# Patient Record
Sex: Female | Born: 1970 | Race: White | Hispanic: No | Smoking: Never smoker
Health system: Southern US, Community
[De-identification: ages and names within clinical notes are randomized; demographics above are authoritative.]

## PROBLEM LIST (undated history)

## (undated) DIAGNOSIS — A0472 Enterocolitis due to Clostridium difficile, not specified as recurrent: Secondary | ICD-10-CM

## (undated) DIAGNOSIS — M542 Cervicalgia: Secondary | ICD-10-CM

## (undated) DIAGNOSIS — I10 Essential (primary) hypertension: Secondary | ICD-10-CM

## (undated) DIAGNOSIS — R0789 Other chest pain: Secondary | ICD-10-CM

## (undated) DIAGNOSIS — E119 Type 2 diabetes mellitus without complications: Secondary | ICD-10-CM

## (undated) DIAGNOSIS — K219 Gastro-esophageal reflux disease without esophagitis: Secondary | ICD-10-CM

## (undated) DIAGNOSIS — M25569 Pain in unspecified knee: Secondary | ICD-10-CM

## (undated) DIAGNOSIS — R7303 Prediabetes: Secondary | ICD-10-CM

## (undated) DIAGNOSIS — R109 Unspecified abdominal pain: Secondary | ICD-10-CM

## (undated) DIAGNOSIS — G8929 Other chronic pain: Secondary | ICD-10-CM

## (undated) DIAGNOSIS — M549 Dorsalgia, unspecified: Secondary | ICD-10-CM

## (undated) DIAGNOSIS — J189 Pneumonia, unspecified organism: Secondary | ICD-10-CM

## (undated) DIAGNOSIS — M5430 Sciatica, unspecified side: Secondary | ICD-10-CM

## (undated) DIAGNOSIS — M797 Fibromyalgia: Secondary | ICD-10-CM

## (undated) DIAGNOSIS — M069 Rheumatoid arthritis, unspecified: Secondary | ICD-10-CM

## (undated) DIAGNOSIS — E039 Hypothyroidism, unspecified: Secondary | ICD-10-CM

## (undated) DIAGNOSIS — M5412 Radiculopathy, cervical region: Secondary | ICD-10-CM

## (undated) DIAGNOSIS — R0781 Pleurodynia: Secondary | ICD-10-CM

## (undated) DIAGNOSIS — F32A Depression, unspecified: Secondary | ICD-10-CM

## (undated) HISTORY — PX: BLADDER SURGERY: SHX569

## (undated) HISTORY — PX: BACK SURGERY: SHX140

## (undated) HISTORY — PX: TUBAL LIGATION: SHX77

## (undated) HISTORY — DX: Hypothyroidism, unspecified: E03.9

## (undated) HISTORY — PX: URETHRAL DIVERTICULECTOMY: SHX2618

---

## 2004-01-10 ENCOUNTER — Other Ambulatory Visit: Payer: Self-pay

## 2004-07-19 ENCOUNTER — Emergency Department: Payer: Self-pay | Admitting: Emergency Medicine

## 2004-10-26 ENCOUNTER — Emergency Department: Payer: Self-pay | Admitting: Emergency Medicine

## 2005-04-27 ENCOUNTER — Emergency Department: Payer: Self-pay | Admitting: Emergency Medicine

## 2005-04-28 ENCOUNTER — Other Ambulatory Visit: Payer: Self-pay

## 2005-05-29 ENCOUNTER — Emergency Department: Payer: Self-pay | Admitting: Emergency Medicine

## 2005-05-30 ENCOUNTER — Emergency Department: Payer: Self-pay | Admitting: Emergency Medicine

## 2005-06-01 ENCOUNTER — Ambulatory Visit: Payer: Self-pay | Admitting: Emergency Medicine

## 2005-07-21 ENCOUNTER — Emergency Department: Payer: Self-pay | Admitting: Emergency Medicine

## 2005-08-03 ENCOUNTER — Ambulatory Visit: Payer: Self-pay | Admitting: Urology

## 2005-09-28 ENCOUNTER — Ambulatory Visit: Payer: Self-pay | Admitting: Urology

## 2005-09-29 ENCOUNTER — Emergency Department: Payer: Self-pay | Admitting: Urology

## 2006-02-26 ENCOUNTER — Emergency Department: Payer: Self-pay | Admitting: Emergency Medicine

## 2006-03-12 ENCOUNTER — Emergency Department: Payer: Self-pay | Admitting: Emergency Medicine

## 2006-05-04 ENCOUNTER — Other Ambulatory Visit: Payer: Self-pay

## 2006-05-04 ENCOUNTER — Emergency Department: Payer: Self-pay | Admitting: Emergency Medicine

## 2006-05-21 ENCOUNTER — Emergency Department: Payer: Self-pay | Admitting: Emergency Medicine

## 2006-05-22 ENCOUNTER — Emergency Department (HOSPITAL_COMMUNITY): Admission: EM | Admit: 2006-05-22 | Discharge: 2006-05-22 | Payer: Self-pay | Admitting: Emergency Medicine

## 2008-06-16 ENCOUNTER — Emergency Department (HOSPITAL_COMMUNITY): Admission: EM | Admit: 2008-06-16 | Discharge: 2008-06-16 | Payer: Self-pay | Admitting: Emergency Medicine

## 2008-06-21 ENCOUNTER — Encounter
Admission: RE | Admit: 2008-06-21 | Discharge: 2008-09-19 | Payer: Self-pay | Admitting: Physical Medicine & Rehabilitation

## 2008-06-25 ENCOUNTER — Ambulatory Visit: Payer: Self-pay | Admitting: Physical Medicine & Rehabilitation

## 2009-05-21 ENCOUNTER — Emergency Department (HOSPITAL_COMMUNITY): Admission: EM | Admit: 2009-05-21 | Discharge: 2009-05-21 | Payer: Self-pay | Admitting: Emergency Medicine

## 2009-09-11 ENCOUNTER — Encounter: Admission: RE | Admit: 2009-09-11 | Discharge: 2009-09-11 | Payer: Self-pay | Admitting: Family Medicine

## 2009-09-23 ENCOUNTER — Emergency Department (HOSPITAL_COMMUNITY): Admission: EM | Admit: 2009-09-23 | Discharge: 2009-09-23 | Payer: Self-pay | Admitting: Emergency Medicine

## 2010-11-17 ENCOUNTER — Ambulatory Visit (HOSPITAL_BASED_OUTPATIENT_CLINIC_OR_DEPARTMENT_OTHER): Payer: Medicare Other | Attending: Family Medicine

## 2010-12-23 ENCOUNTER — Ambulatory Visit (HOSPITAL_BASED_OUTPATIENT_CLINIC_OR_DEPARTMENT_OTHER): Payer: Medicare Other

## 2011-01-20 NOTE — Assessment & Plan Note (Signed)
A 40 year old female with long history of joint pain.  She has pain in  shoulders.  She has neck pain as well as back pain.  She has had  multiple injections by her primary care physician, who is Dr. Lacie Scotts.  She has developed a Cushing syndrome.   She was diagnosed with rheumatoid factor positive, but sent to a  rheumatologist, and she was told that while she has rheumatoid factor  positive, she does not have all the clinical signs of rheumatoid  arthritis.  She had a CT of the neck with contrast showing right C5-C6  osteophyte, smaller right C4-C5 osteophyte.  Her lumbar MRI was normal.  Her EMG/NCV of bilateral upper extremities was normal.   I did review her [place] DMD DSAS  query.  She has over time been on  oxycodone, hydrocodone, and alprazolam as well as a fentanyl patch in  the past.  Prescribers include Dr. Lacie Scotts and his PA as well as Dr.  Harrington Challenger at [facility]; Dr. Alison Murray, who is a dentist in [place],  [place]; Dr. Christell Constant in [place], [place]; Dr. Lynnea Ferrier  at [facility].  This report spans October 25, 2006  until May 28, 2008.  The last prescription was from Dr. Tanya Nones at  [facility].   Her pain level is recorded as a 9/10.  She can walk 5-10 minutes at a  time.  She does not climb steps.  She does not drive.  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  Her Oswestry disability index is 64%.   REVIEW OF SYSTEMS:  Positive for depression, anxiety, and spasms.   PAST SURGICAL HISTORY:  Tubal ligation and bladder surgeries in 2006.   SOCIAL HISTORY:  Married.  She has a 39 year old, who also has a child.  She has a 92 year old and a 54-year-old child of her own.   FAMILY HISTORY:  Heart disease, lung disease,  diabetes, and high blood  pressure.   PSYCHIATRIC PROBLEMS:  Alcohol abuse, drug abuse, and disability.   PHYSICAL EXAMINATION:  VITAL SIGNS:  Her blood pressure is 140/75, pulse  105, respirations 18, and O2 sats 96% on room air.  GENERAL:  Well-developed, overweight female in no acute distress.  She  does have a buffalo hump.  Face appears mildly cushingoid.  Orientation  x3.  Affect is alert.  Gait is normal.  No evidence of toe drag or knee  instability.  EXTREMITIES:  Without edema.  She has normal coordination in the upper  and lower extremities.  Deep tendon reflexes are normal.  Sensation is  normal in the upper and lower extremities.  Full range of motion is  normal in the bilateral upper and lower extremities.  Her neck range of  motion is mildly diminished on the left side with lateral bending and  rotation and moderately diminished on the right side.  She has negative  Spurling maneuver.  She has normal deep tendon reflexes.  Her lumbar  spine range of motion is only mildly diminished.  Straight leg raising  test is negative.  She has mild tenderness to palpation in the lumbar  paraspinal muscles.  Her knees showed no evidence of effusion.  Hands,  wrists, elbows, as well as  feet showed no evidence of joint  abnormalities, deformities, or effusion.  She has normal peripheral  pulses.   IMPRESSION:  1. Chronic pain, back.  MRI negative.  Differential includes      sacroiliac disorder, lumbar facet syndrome with myofascial pain.      We do not think diskogenic pain is a factor here.  2. Neck pain with some cervical spondylosis, but no evidence of      radiculopathy.  3. 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  She does appear to have      wide pelvic angle, which would predispose her.  Given her weight,      would check a knee x-ray just to make sure her joint space is      maintained.  I will see her back in a month and follow up on films.  4. We will send her to  physical therapy and do some terminal knee      extensions overall strengthening and conditioning.  I have overall      emphasized trying to get back to more function, again she would be      able to get back at least a sedentary-type position.   Given multiple providers in the past, would shy away from narcotic  analgesics plus she really does not have any clear indication for these  anyway, I have given her a prescription for Mobic 7.5 daily.  She can  take this along with some Tylenol.  She can continue her Effexor for  anxiety rather than using alprazolam, which is not used for chronic  anxiety.      Erick Colace, M.D.  Electronically Signed     AEK/MedQ  D:  06/25/2008 16:40:20  T:  06/26/2008 03:38:08  Job #:  [id number]

## 2011-03-06 ENCOUNTER — Other Ambulatory Visit: Payer: Self-pay | Admitting: Family Medicine

## 2011-03-06 DIAGNOSIS — M5431 Sciatica, right side: Secondary | ICD-10-CM

## 2011-03-07 ENCOUNTER — Other Ambulatory Visit: Payer: Medicare Other

## 2011-03-12 ENCOUNTER — Other Ambulatory Visit: Payer: Medicare Other

## 2011-03-16 ENCOUNTER — Other Ambulatory Visit: Payer: Medicare Other

## 2011-03-25 ENCOUNTER — Emergency Department (HOSPITAL_COMMUNITY): Payer: Medicare Other

## 2011-03-25 ENCOUNTER — Emergency Department (HOSPITAL_COMMUNITY)
Admission: EM | Admit: 2011-03-25 | Discharge: 2011-03-25 | Disposition: A | Discharge status: Home / Self Care | Payer: Medicare Other | Attending: Emergency Medicine | Admitting: Emergency Medicine

## 2011-03-25 DIAGNOSIS — IMO0001 Reserved for inherently not codable concepts without codable children: Secondary | ICD-10-CM | POA: Insufficient documentation

## 2011-03-25 DIAGNOSIS — R109 Unspecified abdominal pain: Secondary | ICD-10-CM | POA: Insufficient documentation

## 2011-03-25 DIAGNOSIS — I10 Essential (primary) hypertension: Secondary | ICD-10-CM | POA: Insufficient documentation

## 2011-03-25 DIAGNOSIS — K921 Melena: Secondary | ICD-10-CM | POA: Insufficient documentation

## 2011-03-25 DIAGNOSIS — N390 Urinary tract infection, site not specified: Secondary | ICD-10-CM | POA: Insufficient documentation

## 2011-03-25 LAB — POCT I-STAT, CHEM 8
Calcium, Ion: 1.22 mmol/L (ref 1.12–1.32)
Potassium: 4 mEq/L (ref 3.5–5.1)
Sodium: 136 mEq/L (ref 135–145)
TCO2: 24 mmol/L (ref 0–100)

## 2011-03-25 LAB — CBC
HCT: 41.5 % (ref 36.0–46.0)
MCH: 30.3 pg (ref 26.0–34.0)
MCHC: 35.2 g/dL (ref 30.0–36.0)
RBC: 4.82 MIL/uL (ref 3.87–5.11)
RDW: 12.7 % (ref 11.5–15.5)
WBC: 13.2 10*3/uL — ABNORMAL HIGH (ref 4.0–10.5)

## 2011-03-25 LAB — URINALYSIS, ROUTINE W REFLEX MICROSCOPIC: Specific Gravity, Urine: 1.02 (ref 1.005–1.030)

## 2011-03-25 LAB — URINE MICROSCOPIC-ADD ON

## 2011-03-25 LAB — PROTIME-INR: INR: 0.97 (ref 0.00–1.49)

## 2011-03-25 LAB — SAMPLE TO BLOOD BANK

## 2013-02-10 ENCOUNTER — Ambulatory Visit (HOSPITAL_COMMUNITY)
Admission: RE | Admit: 2013-02-10 | Discharge: 2013-02-10 | Disposition: A | Discharge status: Home / Self Care | Payer: Medicare Other | Source: Ambulatory Visit | Attending: Family Medicine | Admitting: Family Medicine

## 2013-02-10 ENCOUNTER — Other Ambulatory Visit: Payer: Self-pay | Admitting: Family Medicine

## 2013-02-10 ENCOUNTER — Other Ambulatory Visit (HOSPITAL_COMMUNITY): Payer: Self-pay | Admitting: Family Medicine

## 2013-02-10 DIAGNOSIS — M545 Low back pain, unspecified: Secondary | ICD-10-CM

## 2013-02-10 DIAGNOSIS — M7989 Other specified soft tissue disorders: Secondary | ICD-10-CM

## 2013-02-10 DIAGNOSIS — M79609 Pain in unspecified limb: Secondary | ICD-10-CM | POA: Insufficient documentation

## 2013-02-10 NOTE — Progress Notes (Signed)
VASCULAR LAB PRELIMINARY  PRELIMINARY  PRELIMINARY  PRELIMINARY  Left lower extremity venous duplex completed.    Preliminary report:  Left:  No evidence of DVT or superficial thrombosis. There is a  Baker's cyst. In the popliteal fossa measuring 1.27 cm  X 2.4 cm  Regina Patton, RVS 02/10/2013, 10:34 PM

## 2013-02-15 ENCOUNTER — Ambulatory Visit
Admission: RE | Admit: 2013-02-15 | Discharge: 2013-02-15 | Disposition: A | Discharge status: Home / Self Care | Payer: Medicare Other | Source: Ambulatory Visit | Attending: Family Medicine | Admitting: Family Medicine

## 2013-02-15 DIAGNOSIS — M545 Low back pain, unspecified: Secondary | ICD-10-CM

## 2013-02-25 ENCOUNTER — Other Ambulatory Visit: Payer: Medicare Other

## 2013-09-12 ENCOUNTER — Emergency Department (HOSPITAL_COMMUNITY)
Admission: EM | Admit: 2013-09-12 | Discharge: 2013-09-12 | Disposition: A | Discharge status: Home / Self Care | Payer: Medicare Other | Attending: Emergency Medicine | Admitting: Emergency Medicine

## 2013-09-12 ENCOUNTER — Encounter (HOSPITAL_COMMUNITY): Payer: Self-pay | Admitting: Emergency Medicine

## 2013-09-12 DIAGNOSIS — N61 Mastitis without abscess: Secondary | ICD-10-CM | POA: Insufficient documentation

## 2013-09-12 DIAGNOSIS — Z79899 Other long term (current) drug therapy: Secondary | ICD-10-CM | POA: Insufficient documentation

## 2013-09-12 DIAGNOSIS — Z792 Long term (current) use of antibiotics: Secondary | ICD-10-CM | POA: Insufficient documentation

## 2013-09-12 DIAGNOSIS — Z791 Long term (current) use of non-steroidal anti-inflammatories (NSAID): Secondary | ICD-10-CM | POA: Insufficient documentation

## 2013-09-12 DIAGNOSIS — Z880 Allergy status to penicillin: Secondary | ICD-10-CM | POA: Insufficient documentation

## 2013-09-12 DIAGNOSIS — N611 Abscess of the breast and nipple: Secondary | ICD-10-CM

## 2013-09-12 DIAGNOSIS — M069 Rheumatoid arthritis, unspecified: Secondary | ICD-10-CM | POA: Insufficient documentation

## 2013-09-12 HISTORY — DX: Rheumatoid arthritis, unspecified: M06.9

## 2013-09-12 MED ORDER — HYDROCODONE-ACETAMINOPHEN 5-325 MG PO TABS: 1.0000 | ORAL_TABLET | Freq: Four times a day (QID) | ORAL | Status: DC | PRN

## 2013-09-12 MED ORDER — SULFAMETHOXAZOLE-TMP DS 800-160 MG PO TABS
1.0000 | ORAL_TABLET | Freq: Once | ORAL | Status: AC
  Administered 2013-09-12: 1 via ORAL
  Filled 2013-09-12: qty 1

## 2013-09-12 MED ORDER — HYDROCODONE-ACETAMINOPHEN 5-325 MG PO TABS
1.0000 | ORAL_TABLET | Freq: Once | ORAL | Status: AC
  Administered 2013-09-12: 1 via ORAL
  Filled 2013-09-12: qty 1

## 2013-09-12 MED ORDER — LIDOCAINE HCL 1 % IJ SOLN: 5.0000 mL | Freq: Once | INTRAMUSCULAR | Status: DC

## 2013-09-12 MED ORDER — SULFAMETHOXAZOLE-TMP DS 800-160 MG PO TABS: 1.0000 | ORAL_TABLET | Freq: Two times a day (BID) | ORAL | Status: DC

## 2013-09-12 NOTE — ED Provider Notes (Signed)
Medical screening examination/treatment/procedure(s) were performed by non-physician practitioner and as supervising physician I was immediately available for consultation/collaboration.  EKG Interpretation   None         Shanna Cisco, MD 09/12/13 2338

## 2013-09-12 NOTE — ED Notes (Signed)
Pt presents with c/o abscess on her left breast; pt does have some redness and swelling to that area; pt has noted minimal drainage and has covered with a gauze at this time.

## 2013-09-12 NOTE — ED Provider Notes (Signed)
CSN: [id number]     Arrival date & time 09/12/13  1844 History  This chart was scribed for non-physician practitioner, Earley Favor, FNP,working with Shanna Cisco, MD, by Karle Plumber, ED Scribe.  This patient was seen in room WTR8/WTR8 and the patient's care was started at 8:35 PM.  Chief Complaint  Patient presents with  . Abscess   The history is provided by the patient. No language interpreter was used.   HPI Comments:  ANALYA Patton is a 43 y.o. female who presents to the Emergency Department complaining of an abscess to her left breast that started approximately one week. She states it has worsened over the past two days. She reports some drainage throughout the day. She denies fever. Pt states her PCP is Dr. Jeannetta Nap.  Past Medical History  Diagnosis Date  . Rheumatoid arthritis    Past Surgical History  Procedure Laterality Date  . Tubal ligation    . Bladder surgery     No family history on file. History  Substance Use Topics  . Smoking status: Never Smoker   . Smokeless tobacco: Not on file  . Alcohol Use: No   OB History   Grav Para Term Preterm Abortions TAB SAB Ect Mult Living                 Review of Systems  Constitutional: Negative for fever.  Skin: Positive for wound.       Abscess to left breast  All other systems reviewed and are negative.   Allergies  Penicillins  Home Medications   Current Outpatient Rx  Name  Route  Sig  Dispense  Refill  . busPIRone (BUSPAR) 7.5 MG tablet   Oral   Take 7.5 mg by mouth daily.         Marland Kitchen HYDROcodone-acetaminophen (NORCO) 7.5-325 MG per tablet   Oral   Take 1 tablet by mouth every 6 (six) hours as needed for moderate pain (pain).         . meloxicam (MOBIC) 7.5 MG tablet   Oral   Take 7.5 mg by mouth 3 (three) times daily.         Marland Kitchen venlafaxine XR (EFFEXOR-XR) 75 MG 24 hr capsule   Oral   Take 75 mg by mouth 3 (three) times daily.         Marland Kitchen HYDROcodone-acetaminophen (NORCO/VICODIN) 5-325  MG per tablet   Oral   Take 1 tablet by mouth every 6 (six) hours as needed for moderate pain.   12 tablet   0   . sulfamethoxazole-trimethoprim (BACTRIM DS) 800-160 MG per tablet   Oral   Take 1 tablet by mouth 2 (two) times daily.   5 tablet   0    Triage Vitals: BP 143/91  Pulse 96  Temp(Src) 97.6 F (36.4 C) (Oral)  Resp 18  SpO2 93%  LMP 09/05/2013 Physical Exam  Nursing note and vitals reviewed. Constitutional: She is oriented to person, place, and time. She appears well-developed and well-nourished.  HENT:  Head: Normocephalic and atraumatic.  Eyes: EOM are normal.  Neck: Normal range of motion.  Cardiovascular: Normal rate.   Pulmonary/Chest: Effort normal.    Musculoskeletal: Normal range of motion.  Neurological: She is alert and oriented to person, place, and time.  Skin: Skin is warm and dry.  Psychiatric: She has a normal mood and affect. Her behavior is normal.    ED Course  Procedures (including critical care time) DIAGNOSTIC STUDIES: Oxygen Saturation  is 93% on RA, low by my interpretation.   COORDINATION OF CARE: 8:39 PM- Will I & D abscess. Advised pt to keep area clean and change dressing as needed. Will start oral antibiotics. Pt verbalizes understanding and agrees to plan.  INCISION AND DRAINAGE Performed by: Earley Favor, FNP Consent: Verbal consent obtained. Risks and benefits: risks, benefits and alternatives were discussed Type: abscess  Body area: left breast  Anesthesia: local infiltration  Incision was made with a scalpel.  Local anesthetic: lidocaine 1% with epinephrine  Anesthetic total: 2 ml  Complexity: complex Blunt dissection to break up loculations  Drainage: purulent  Drainage amount: moderate  Packing material: 1/4 in iodoform gauze  Patient tolerance: Patient tolerated the procedure well with no immediate complications.  Labs Review Labs Reviewed - No data to display Imaging Review No results found.  EKG  Interpretation   None       MDM   1. Abscess of left breast       I personally performed the services described in this documentation, which was scribed in my presence. The recorded information has been reviewed and is accurate.    Arman Filter, NP 09/12/13 2112

## 2013-09-12 NOTE — Discharge Instructions (Signed)
Abscess An abscess (boil or furuncle) is an infected area on or under the skin. This area is filled with yellowish-white fluid (pus) and other material (debris). HOME CARE   Only take medicines as told by your doctor.  If you were given antibiotic medicine, take it as directed. Finish the medicine even if you start to feel better.  If gauze is used, follow your doctor's directions for changing the gauze.  To avoid spreading the infection:  Keep your abscess covered with a bandage.  Wash your hands well.  Do not share personal care items, towels, or whirlpools with others.  Avoid skin contact with others.  Keep your skin and clothes clean around the abscess.  Keep all doctor visits as told. GET HELP RIGHT AWAY IF:   You have more pain, puffiness (swelling), or redness in the wound site.  You have more fluid or blood coming from the wound site.  You have muscle aches, chills, or you feel sick.  You have a fever. MAKE SURE YOU:   Understand these instructions.  Will watch your condition.  Will get help right away if you are not doing well or get worse. Document Released: 02/10/2008 Document Revised: 02/23/2012 Document Reviewed: 11/06/2011 [facility] Patient Information 2014 [place], [place]. As discussed, in 2 days please remove the packing and  allow the skin to heal

## 2013-12-29 ENCOUNTER — Ambulatory Visit
Admission: RE | Admit: 2013-12-29 | Discharge: 2013-12-29 | Disposition: A | Discharge status: Home / Self Care | Payer: Medicare Other | Source: Ambulatory Visit | Attending: Family Medicine | Admitting: Family Medicine

## 2013-12-29 ENCOUNTER — Other Ambulatory Visit: Payer: Self-pay | Admitting: Family Medicine

## 2013-12-29 DIAGNOSIS — R1011 Right upper quadrant pain: Secondary | ICD-10-CM

## 2014-02-23 ENCOUNTER — Emergency Department (INDEPENDENT_AMBULATORY_CARE_PROVIDER_SITE_OTHER): Payer: Medicare Other

## 2014-02-23 ENCOUNTER — Emergency Department (INDEPENDENT_AMBULATORY_CARE_PROVIDER_SITE_OTHER)
Admission: EM | Admit: 2014-02-23 | Discharge: 2014-02-23 | Disposition: A | Discharge status: Home / Self Care | Payer: Medicare Other | Source: Home / Self Care | Attending: Family Medicine | Admitting: Family Medicine

## 2014-02-23 ENCOUNTER — Encounter (HOSPITAL_COMMUNITY): Payer: Self-pay | Admitting: Emergency Medicine

## 2014-02-23 DIAGNOSIS — R079 Chest pain, unspecified: Secondary | ICD-10-CM

## 2014-02-23 DIAGNOSIS — R0781 Pleurodynia: Secondary | ICD-10-CM

## 2014-02-23 NOTE — Discharge Instructions (Signed)
Thank you for coming in today. Continue your pain medicine.  Attend physical therapy.  Follow up with Dr. Farris Has for evaluation at [facility] Return as needed  Costochondritis Costochondritis, sometimes called Tietze syndrome, is a swelling and irritation (inflammation) of the tissue (cartilage) that connects your ribs with your breastbone (sternum). It causes pain in the chest and rib area. Costochondritis usually goes away on its own over time. It can take up to 6 weeks or longer to get better, especially if you are unable to limit your activities. CAUSES  Some cases of costochondritis have no known cause. Possible causes include:  Injury (trauma).  Exercise or activity such as lifting.  Severe coughing. SIGNS AND SYMPTOMS  Pain and tenderness in the chest and rib area.  Pain that gets worse when coughing or taking deep breaths.  Pain that gets worse with specific movements. DIAGNOSIS  Your health care provider will do a physical exam and ask about your symptoms. Chest X-rays or other tests may be done to rule out other problems. TREATMENT  Costochondritis usually goes away on its own over time. Your health care provider may prescribe medicine to help relieve pain. HOME CARE INSTRUCTIONS   Avoid exhausting physical activity. Try not to strain your ribs during normal activity. This would include any activities using chest, abdominal, and side muscles, especially if heavy weights are used.  Apply ice to the affected area for the first 2 days after the pain begins.  Put ice in a plastic bag.  Place a towel between your skin and the bag.  Leave the ice on for 20 minutes, 2-3 times a day.  Only take over-the-counter or prescription medicines as directed by your health care provider. SEEK MEDICAL CARE IF:  You have redness or swelling at the rib joints. These are signs of infection.  Your pain does not go away despite rest or medicine. SEEK IMMEDIATE MEDICAL  CARE IF:   Your pain increases or you are very uncomfortable.  You have shortness of breath or difficulty breathing.  You cough up blood.  You have worse chest pains, sweating, or vomiting.  You have a fever or persistent symptoms for more than 2-3 days.  You have a fever and your symptoms suddenly get worse. MAKE SURE YOU:   Understand these instructions.  Will watch your condition.  Will get help right away if you are not doing well or get worse. Document Released: 06/03/2005 Document Revised: 06/14/2013 Document Reviewed: 03/28/2013 [facility] Patient Information 2015 [place], [place]. This information is not intended to replace advice given to you by your health care provider. Make sure you discuss any questions you have with your health care provider.

## 2014-02-23 NOTE — ED Notes (Signed)
Reports right sided pain at bra line off/on for 1 month.  States on waiting list to see specialist.  Pain is sharp and constant/ more painful with movement.  Denies any known injury.   No relief with otc meds or vicodin

## 2014-02-23 NOTE — ED Provider Notes (Signed)
Regina Patton is a 43 y.o. female who presents to Urgent Care today for rib pain. Patient notes months of chronic right-sided rib pain. This is currently being evaluated by her primary care provider. She notes the pain is worse with deep inspiration and better with rest. She's taking 8 tablets of 7.5 hydrocodone/acetaminophen daily which does not control her pain. She denies any significant abdominal pain or urinary symptoms. No nausea vomiting or diarrhea. 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 She is currently on the waiting list to see her gastroenterologist. The pain radiates from her right thoracic back around to her right breast. No exertional pain. No palpitations.   Past Medical History  Diagnosis Date  . Rheumatoid arthritis    History  Substance Use Topics  . Smoking status: Never Smoker   . Smokeless tobacco: Not on file  . Alcohol Use: No   ROS as above Medications: No current facility-administered medications for this encounter.   Current Outpatient Prescriptions  Medication Sig Dispense Refill  . busPIRone (BUSPAR) 7.5 MG tablet Take 7.5 mg by mouth daily.      Marland Kitchen HYDROcodone-acetaminophen (NORCO) 7.5-325 MG per tablet Take 1 tablet by mouth every 6 (six) hours as needed for moderate pain (pain).      Marland Kitchen HYDROcodone-acetaminophen (NORCO/VICODIN) 5-325 MG per tablet Take 1 tablet by mouth every 6 (six) hours as needed for moderate pain.  12 tablet  0  . sulfamethoxazole-trimethoprim (BACTRIM DS) 800-160 MG per tablet Take 1 tablet by mouth 2 (two) times daily.  5 tablet  0  . venlafaxine XR (EFFEXOR-XR) 75 MG 24 hr capsule Take 75 mg by mouth 3 (three) times daily.      . meloxicam (MOBIC) 7.5 MG tablet Take 7.5 mg by mouth 3 (three) times daily.        Exam:  BP 146/52  Pulse 102  Temp(Src) 97.8 F (36.6 C) (Oral)  Resp 16  SpO2 99%  LMP 02/14/2014 Gen: Well NAD HEENT: EOMI,  MMM Lungs: Normal work of breathing. CTABL Heart:  RRR no MRG Chest wall: Normal-appearing but tender to palpation along the right lower posterior to lateral anterior ribs. Abd: NABS, Soft. NT, ND Exts: Brisk capillary refill, warm and well perfused.   No results found for this or any previous visit (from the past 24 hour(s)). Dg Chest 2 View  02/23/2014   CLINICAL DATA:  RIGHT side pain for 2 days, no injury, history asthma, hypertension, rheumatoid arthritis  EXAM: CHEST  2 VIEW  COMPARISON:  06/16/2008  FINDINGS: Borderline enlargement of cardiac silhouette.  Mediastinal contours and pulmonary vascularity normal.  Lungs clear.  Minimal central peribronchial thickening.  No pleural effusion or pneumothorax.  Bones unremarkable.  IMPRESSION: Minimal bronchitic changes without acute infiltrate.   Electronically Signed   By: Ulyses Southward M.D.   On: 02/23/2014 17:58   Dg Ribs Unilateral Right  02/23/2014   CLINICAL DATA:  Right-sided pain  EXAM: RIGHT RIBS - 2 VIEW  COMPARISON:  None.  FINDINGS: No fracture or other bone lesions are seen involving the ribs.  IMPRESSION: Negative.   Electronically Signed   By: Elige Ko   On: 02/23/2014 18:01    Assessment and Plan: 43 y.o. female with right rib pain. At this point etiology is somewhat unclear. She possibly has recalcitrant costochondritis versus radicular thoracic back pain. She currently is being evaluated for fatty liver as an etiology of her pain. I think she would benefit from referral  to sports medicine orthopedics for consideration of radicular thoracic back pain is a possibility. Additionally I have refered patient to physical therapy. Pain management per primary care provider.   Discussed warning signs or symptoms. Please see discharge instructions. Patient expresses understanding.    Rodolph Bong, MD 02/23/14 [phone]

## 2014-02-26 ENCOUNTER — Encounter (HOSPITAL_COMMUNITY): Payer: Self-pay | Admitting: Emergency Medicine

## 2014-02-26 ENCOUNTER — Emergency Department (HOSPITAL_COMMUNITY): Payer: Medicare Other

## 2014-02-26 ENCOUNTER — Emergency Department (HOSPITAL_COMMUNITY)
Admission: EM | Admit: 2014-02-26 | Discharge: 2014-02-26 | Disposition: A | Discharge status: Home / Self Care | Payer: Medicare Other | Attending: Emergency Medicine | Admitting: Emergency Medicine

## 2014-02-26 DIAGNOSIS — M791 Myalgia, unspecified site: Secondary | ICD-10-CM

## 2014-02-26 DIAGNOSIS — Y9289 Other specified places as the place of occurrence of the external cause: Secondary | ICD-10-CM | POA: Diagnosis not present

## 2014-02-26 DIAGNOSIS — S335XXA Sprain of ligaments of lumbar spine, initial encounter: Secondary | ICD-10-CM | POA: Diagnosis not present

## 2014-02-26 DIAGNOSIS — S99929A Unspecified injury of unspecified foot, initial encounter: Secondary | ICD-10-CM

## 2014-02-26 DIAGNOSIS — S99919A Unspecified injury of unspecified ankle, initial encounter: Secondary | ICD-10-CM | POA: Diagnosis not present

## 2014-02-26 DIAGNOSIS — S46909A Unspecified injury of unspecified muscle, fascia and tendon at shoulder and upper arm level, unspecified arm, initial encounter: Secondary | ICD-10-CM | POA: Diagnosis not present

## 2014-02-26 DIAGNOSIS — S4980XA Other specified injuries of shoulder and upper arm, unspecified arm, initial encounter: Secondary | ICD-10-CM | POA: Insufficient documentation

## 2014-02-26 DIAGNOSIS — Y9389 Activity, other specified: Secondary | ICD-10-CM | POA: Insufficient documentation

## 2014-02-26 DIAGNOSIS — Z3202 Encounter for pregnancy test, result negative: Secondary | ICD-10-CM | POA: Diagnosis not present

## 2014-02-26 DIAGNOSIS — R296 Repeated falls: Secondary | ICD-10-CM | POA: Diagnosis not present

## 2014-02-26 DIAGNOSIS — S8990XA Unspecified injury of unspecified lower leg, initial encounter: Secondary | ICD-10-CM | POA: Diagnosis not present

## 2014-02-26 DIAGNOSIS — Z79899 Other long term (current) drug therapy: Secondary | ICD-10-CM | POA: Insufficient documentation

## 2014-02-26 DIAGNOSIS — M069 Rheumatoid arthritis, unspecified: Secondary | ICD-10-CM | POA: Diagnosis not present

## 2014-02-26 DIAGNOSIS — Z791 Long term (current) use of non-steroidal anti-inflammatories (NSAID): Secondary | ICD-10-CM | POA: Diagnosis not present

## 2014-02-26 DIAGNOSIS — S39012A Strain of muscle, fascia and tendon of lower back, initial encounter: Secondary | ICD-10-CM

## 2014-02-26 DIAGNOSIS — IMO0002 Reserved for concepts with insufficient information to code with codable children: Secondary | ICD-10-CM | POA: Diagnosis present

## 2014-02-26 DIAGNOSIS — Z880 Allergy status to penicillin: Secondary | ICD-10-CM | POA: Diagnosis not present

## 2014-02-26 DIAGNOSIS — W19XXXA Unspecified fall, initial encounter: Secondary | ICD-10-CM

## 2014-02-26 DIAGNOSIS — Z792 Long term (current) use of antibiotics: Secondary | ICD-10-CM | POA: Insufficient documentation

## 2014-02-26 LAB — POC URINE PREG, ED: Preg Test, Ur: NEGATIVE

## 2014-02-26 MED ORDER — METHOCARBAMOL 500 MG PO TABS: 500.0000 mg | ORAL_TABLET | Freq: Three times a day (TID) | ORAL | Status: DC | PRN

## 2014-02-26 MED ORDER — HYDROCODONE-ACETAMINOPHEN 5-325 MG PO TABS
1.0000 | ORAL_TABLET | Freq: Four times a day (QID) | ORAL | Status: DC | PRN
Start: 2014-02-26 — End: 2014-11-29

## 2014-02-26 MED ORDER — HYDROCODONE-ACETAMINOPHEN 5-325 MG PO TABS
1.0000 | ORAL_TABLET | Freq: Once | ORAL | Status: AC
  Administered 2014-02-26: 1 via ORAL
  Filled 2014-02-26: qty 1

## 2014-02-26 NOTE — ED Notes (Signed)
Pt had fall 2 hours ago, now pt c/o right arm pain, pain shooting down right leg, and lower back pain.

## 2014-02-26 NOTE — Discharge Instructions (Signed)
Please call your doctor for a followup appointment within 24-48 hours. When you talk to your doctor please let them know that you were seen in the emergency department and have them acquire all of your records so that they can discuss the findings with you and formulate a treatment plan to fully care for your new and ongoing problems. Please call an set-up an appointment with your primary care provider to be seen and re-assessed Please call and set-up an appointment with Orthopedics to be seen and re-assessed Please rest and stay hydrated Please avoid any physical or strenuous activity Please avoid any heavy lifting Please take medications as prescribed - while on pain medications there is to be no drinking alcohol, driving, operating any heavy machinery - if there is extra please dispose in a proper manner. Please while on pain medications please do not take any extra Tylenol for this can lead to Tylenol overdose and liver issue Please continue to monitor symptoms closely and if symptoms are to worsen or change (fever greater than 101, chills, sweating, nausea, vomiting, chest pain, shortness of breath, difficulty breathing, numbness, tingling, loss of sensation, inability to control urine or bowel movements, fall, injury) please report back to the ED immediately   Muscle Pain Muscle pain (myalgia) may be caused by many things, including:  Overuse or muscle strain, especially if you are not in shape. This is the most common cause of muscle pain.  Injury.  Bruises.  Viruses, such as the flu.  Infectious diseases.  Fibromyalgia, which is a chronic condition that causes muscle tenderness, fatigue, and headache.  Autoimmune diseases, including lupus.  Certain drugs, including ACE inhibitors and statins. Muscle pain may be mild or severe. In most cases, the pain lasts only a short time and goes away without treatment. To diagnose the cause of your muscle pain, your health care provider will  take your medical history. This means he or she will ask you when your muscle pain began and what has been happening. If you have not had muscle pain for very long, your health care provider may want to wait before doing much testing. If your muscle pain has lasted a long time, your health care provider may want to run tests right away. If your health care provider thinks your muscle pain may be caused by illness, you may need to have additional tests to rule out certain conditions.  Treatment for muscle pain depends on the cause. Home care is often enough to relieve muscle pain. Your health care provider may also prescribe anti-inflammatory medicine. HOME CARE INSTRUCTIONS Watch your condition for any changes. The following actions may help to lessen any discomfort you are feeling:  Only take over-the-counter or prescription medicines as directed by your health care provider.  Apply ice to the sore muscle:  Put ice in a plastic bag.  Place a towel between your skin and the bag.  Leave the ice on for 15-20 minutes, 3-4 times a day.  You may alternate applying hot and cold packs to the muscle as directed by your health care provider.  If overuse is causing your muscle pain, slow down your activities until the pain goes away.  Remember that it is normal to feel some muscle pain after starting a workout program. Muscles that have not been used often will be sore at first.  Do regular, gentle exercises if you are not usually active.  Warm up before exercising to lower your risk of muscle pain.  Do not  continue working out if the pain is very bad. Bad pain could mean you have injured a muscle. SEEK MEDICAL CARE IF:  Your muscle pain gets worse, and medicines do not help.  You have muscle pain that lasts longer than 3 days.  You have a rash or fever along with muscle pain.  You have muscle pain after a tick bite.  You have muscle pain while working out, even though you are in good  physical condition.  You have redness, soreness, or swelling along with muscle pain.  You have muscle pain after starting a new medicine or changing the dose of a medicine. SEEK IMMEDIATE MEDICAL CARE IF:  You have trouble breathing.  You have trouble swallowing.  You have muscle pain along with a stiff neck, fever, and vomiting.  You have severe muscle weakness or cannot move part of your body. MAKE SURE YOU:   Understand these instructions.  Will watch your condition.  Will get help right away if you are not doing well or get worse. Document Released: 07/16/2006 Document Revised: 08/29/2013 Document Reviewed: 06/20/2013 [facility] Patient Information 2015 [place], [place]. This information is not intended to replace advice given to you by your health care provider. Make sure you discuss any questions you have with your health care provider.

## 2014-02-26 NOTE — ED Provider Notes (Signed)
CSN: [id number]     Arrival date & time 02/26/14  1728 History  This chart was scribed for non-physician practitioner, Raymon Mutton, PA-C working with Lyanne Co, MD by Luisa Dago, ED scribe. This patient was seen in room WTR5/WTR5 and the patient's care was started at 6:48 PM.    Chief Complaint  Patient presents with  . Fall  . Back Pain  . Arm Pain    right  . Leg Pain    right   The history is provided by the patient. No language interpreter was used.   HPI Comments: Regina Patton is a 43 y.o. female who presents to the Emergency Department complaining of a fall that occurred 2 hours ago. Pt states that she fell in the tub while she was going to take a shower. She states that she landed on her buttocks but tried to catch her self with her right hand. Pt is currently complaining of associated right arm pain, and lower back pain that radiates down her right leg. She describes the pain as "shooting". Pt states that she hit her lower back on the back of the tub. She is also experiencing some mild numbness at the place of impact. She denies any head injury, head trauma, LOC, dizziness, SOB, chest pain, arm numbness or tingling, no loss of sensation, no leg numbness, bladder or bowel incontinence, headache, or visual disturbances. Pt denies any history of back surgeries.    Past Medical History  Diagnosis Date  . Rheumatoid arthritis    Past Surgical History  Procedure Laterality Date  . Tubal ligation    . Bladder surgery     No family history on file. History  Substance Use Topics  . Smoking status: Never Smoker   . Smokeless tobacco: Not on file  . Alcohol Use: No   OB History   Grav Para Term Preterm Abortions TAB SAB Ect Mult Living                 Review of Systems  Constitutional: Negative for fever and chills.  Eyes: Negative for visual disturbance.  Respiratory: Negative for chest tightness and shortness of breath.   Cardiovascular: Negative for chest  pain and leg swelling.  Gastrointestinal: Negative for abdominal pain.  Musculoskeletal: Positive for arthralgias, back pain and myalgias.  Neurological: Positive for numbness (mild). Negative for dizziness, syncope, light-headedness and headaches.  All other systems reviewed and are negative.     Allergies  Penicillins  Home Medications   Prior to Admission medications   Medication Sig Start Date End Date Taking? Authorizing Ambrosia Wisnewski  busPIRone (BUSPAR) 7.5 MG tablet Take 7.5 mg by mouth daily.    Historical Pebbles Zeiders, MD  HYDROcodone-acetaminophen (NORCO) 7.5-325 MG per tablet Take 1 tablet by mouth every 6 (six) hours as needed for moderate pain (pain).    Historical Nilah Belcourt, MD  HYDROcodone-acetaminophen (NORCO/VICODIN) 5-325 MG per tablet Take 1 tablet by mouth every 6 (six) hours as needed for moderate pain. 09/12/13   Arman Filter, NP  HYDROcodone-acetaminophen (NORCO/VICODIN) 5-325 MG per tablet Take 1 tablet by mouth every 6 (six) hours as needed for moderate pain or severe pain. 02/26/14   Marissa Sciacca, PA-C  meloxicam (MOBIC) 7.5 MG tablet Take 7.5 mg by mouth 3 (three) times daily.    Historical Audia Amick, MD  methocarbamol (ROBAXIN) 500 MG tablet Take 1 tablet (500 mg total) by mouth every 8 (eight) hours as needed for muscle spasms. 02/26/14   Raymon Mutton, PA-C  sulfamethoxazole-trimethoprim (BACTRIM DS) 800-160 MG per tablet Take 1 tablet by mouth 2 (two) times daily. 09/12/13   Arman Filter, NP  venlafaxine XR (EFFEXOR-XR) 75 MG 24 hr capsule Take 75 mg by mouth 3 (three) times daily.    Historical Brayn Eckstein, MD   Triage Vitals: BP 145/101  Pulse 100  Temp(Src) 98 F (36.7 C) (Oral)  Resp 20  SpO2 98%  LMP 02/14/2014  Physical Exam  Nursing note and vitals reviewed. Constitutional: She is oriented to person, place, and time. She appears well-developed and well-nourished. No distress.  HENT:  Head: Normocephalic and atraumatic.  Right Ear: External ear normal.   Left Ear: External ear normal.  Nose: Nose normal.  Mouth/Throat: Oropharynx is clear and moist. No oropharyngeal exudate.  Negative palpation hematomas  Negative crepitus or depression palpated to the skull/maxillary region  Eyes: Conjunctivae and EOM are normal. Pupils are equal, round, and reactive to light. Right eye exhibits no discharge. Left eye exhibits no discharge.  Negative nystagmus Visual fields grossly intact Negative crepitus upon palpation to the orbital Negative signs of entrapment  Neck: Normal range of motion. Neck supple. No tracheal deviation present.  Negative neck stiffness Negative nuchal rigidity Negative cervical lymphadenopathy Negative pain upon palpation to the c-spine  Cardiovascular: Normal rate, regular rhythm and normal heart sounds.  Exam reveals no friction rub.   No murmur heard. Pulses:      Radial pulses are 2+ on the right side, and 2+ on the left side.       Dorsalis pedis pulses are 2+ on the right side, and 2+ on the left side.  Cap refill less than 3 seconds  Pulmonary/Chest: Effort normal and breath sounds normal. No respiratory distress. She has no wheezes. She has no rales. She exhibits no tenderness.  Patient is able to speak in full sentences without without difficulty Negative use of accessory muscles Negative stridor  Abdominal:  Obese  Musculoskeletal: Normal range of motion. She exhibits no tenderness.       Back:  Negative swelling, erythema, inflammation, lesions, sores, ecchymosis or deformities identified to the spine. Discomfort upon palpation to the lumbosacral mid and paravertebral regions bilaterally. Negative crepitus upon palpation. Negative pelvic pain upon palpation. Full range of motion to lower tremors bilaterally without difficulty or ataxia noted. Full ROM to upper extremities without difficulty noted, negative ataxia noted. Full ROM to upper and lower extremities without difficulty noted, negative ataxia noted.   Lymphadenopathy:    She has no cervical adenopathy.  Neurological: She is alert and oriented to person, place, and time. No cranial nerve deficit. She exhibits normal muscle tone. Coordination normal.  Cranial nerves III-XII grossly intact Strength 5+/5+ to upper and lower extremities bilaterally with resistance applied, equal distribution noted Sensation intact with differentiation sharp and dull touch Equal grip strength Negative facial drooping Negative slurred speech Negative aphasia Negative arm drift Gait proper, proper balance - negative sway, negative drift, negative step-offs  Skin: Skin is warm and dry. No rash noted. She is not diaphoretic. No erythema.  Psychiatric: She has a normal mood and affect. Her behavior is normal. Thought content normal.    ED Course  Procedures (including critical care time)  DIAGNOSTIC STUDIES: Oxygen Saturation is 98% on RA, normal by my interpretation.    COORDINATION OF CARE: 6:57 PM- Pt advised of plan for treatment and pt agrees.  9:12 PM This Daril Warga spoke with Dr. Fidela Juneau - negative abnormalities noted to the coccyx.   Results  for orders placed during the hospital encounter of 02/26/14  POC URINE PREG, ED      Result Value Ref Range   Preg Test, Ur NEGATIVE  NEGATIVE    Labs Review Labs Reviewed  POC URINE PREG, ED    Imaging Review Dg Lumbar Spine Complete  02/26/2014   CLINICAL DATA:  Low back and right leg pain after a fall.  EXAM: LUMBAR SPINE - COMPLETE 4+ VIEW  COMPARISON:  Radiographs dated 03/25/2011  FINDINGS: There is no fracture or subluxation. There are slight degenerative changes of the facet joints at L4-5 and L5-S1, right more than left. No disc space narrowing.  IMPRESSION: No acute abnormality. Slight degenerative changes in the facet joints in the lower lumbar spine.   Electronically Signed   By: Geanie Cooley M.D.   On: 02/26/2014 20:17   Dg Pelvis 1-2 Views  02/26/2014   CLINICAL DATA:  Fall.  Right  leg and low back pain.  EXAM: PELVIS - 1-2 VIEW  COMPARISON:  03/25/2011  FINDINGS: There is no evidence of pelvic fracture or diastasis. No other pelvic bone lesions are seen.  Vascular calcifications are present in the anatomic pelvis.  IMPRESSION: Negative.   Electronically Signed   By: Herbie Baltimore M.D.   On: 02/26/2014 20:17     EKG Interpretation None      MDM   Final diagnoses:  Fall, initial encounter  Lumbar strain, initial encounter  Muscle pain    Medications  HYDROcodone-acetaminophen (NORCO/VICODIN) 5-325 MG per tablet 1 tablet (not administered)    Filed Vitals:   02/26/14 1739 02/26/14 1931  BP: 145/101 149/88  Pulse: 100 92  Temp: 98 F (36.7 C) 98.3 F (36.8 C)  TempSrc: Oral Oral  Resp: 20 24  SpO2: 98% 96%   I personally performed the services described in this documentation, which was scribed in my presence. The recorded information has been reviewed and is accurate.  Urine pregnancy negative. Plain films of lumbar spine negative acute osseous injury noted - slight degenerative changes identified. Plain film of the pelvis negative for acute injury.  Patient neurovascularly intact. Negative focal neurological deficits noted. Pulses palpable and strong. Gait proper with negative step offs or sway. Doubt cauda equina. Doubt epidural abscess. Suspicion to be muscular pain secondary to impact of fall. Patient stable, afebrile. Patient not septic appearing. Discharged patient. Discharged patient with muscle relaxer and pain medications - discussed with patient course, precautions, disposal technique. Discussed with patient to rest and stay hydrated. Discussed with patient to avoid any physical or strenuous activity. Referred to orthopedics, PCP. Discussed with patient to closely monitor symptoms and if symptoms are to worsen or change to report back to the ED - strict return instructions given.  Patient agreed to plan of care, understood, all questions answered.    Raymon Mutton, PA-C 02/27/14 1139

## 2014-02-27 NOTE — ED Provider Notes (Signed)
  Medical screening examination/treatment/procedure(s) were performed by non-physician practitioner and as supervising physician I was immediately available for consultation/collaboration.   EKG Interpretation None        Malayia Spizzirri M Rashon Westrup, MD 02/27/14 1435 

## 2014-07-14 ENCOUNTER — Emergency Department (HOSPITAL_COMMUNITY)
Admission: EM | Admit: 2014-07-14 | Discharge: 2014-07-14 | Disposition: A | Discharge status: Home / Self Care | Payer: Medicare Other | Attending: Emergency Medicine | Admitting: Emergency Medicine

## 2014-07-14 ENCOUNTER — Encounter (HOSPITAL_COMMUNITY): Payer: Self-pay | Admitting: Emergency Medicine

## 2014-07-14 DIAGNOSIS — K088 Other specified disorders of teeth and supporting structures: Secondary | ICD-10-CM | POA: Diagnosis not present

## 2014-07-14 DIAGNOSIS — Z792 Long term (current) use of antibiotics: Secondary | ICD-10-CM | POA: Insufficient documentation

## 2014-07-14 DIAGNOSIS — M1990 Unspecified osteoarthritis, unspecified site: Secondary | ICD-10-CM | POA: Diagnosis not present

## 2014-07-14 DIAGNOSIS — Z791 Long term (current) use of non-steroidal anti-inflammatories (NSAID): Secondary | ICD-10-CM | POA: Diagnosis not present

## 2014-07-14 DIAGNOSIS — Z79899 Other long term (current) drug therapy: Secondary | ICD-10-CM | POA: Diagnosis not present

## 2014-07-14 DIAGNOSIS — Z880 Allergy status to penicillin: Secondary | ICD-10-CM | POA: Diagnosis not present

## 2014-07-14 DIAGNOSIS — K0889 Other specified disorders of teeth and supporting structures: Secondary | ICD-10-CM

## 2014-07-14 DIAGNOSIS — I10 Essential (primary) hypertension: Secondary | ICD-10-CM | POA: Diagnosis not present

## 2014-07-14 HISTORY — DX: Essential (primary) hypertension: I10

## 2014-07-14 MED ORDER — OXYCODONE-ACETAMINOPHEN 5-325 MG PO TABS
1.0000 | ORAL_TABLET | Freq: Four times a day (QID) | ORAL | Status: DC | PRN
Start: 2014-07-14 — End: 2014-09-01

## 2014-07-14 MED ORDER — CLINDAMYCIN HCL 150 MG PO CAPS: 450.0000 mg | ORAL_CAPSULE | Freq: Three times a day (TID) | ORAL | Status: DC

## 2014-07-14 NOTE — ED Notes (Signed)
Pt c/o dental pain to L lower molar area. Pain radiates to L ear and throat. Symptoms since Thursday. Has taken Motrin and Vicodin 7.5 for pain about 1 1/2 hrs ago. Rates pain 10/10. No acute distress. Skin warm and dry.

## 2014-07-14 NOTE — Discharge Instructions (Signed)
You have a dental injury. Use the resource guide listed below to help you find a dentist if you do not already have one to followup with. It is very important that you get evaluated by a dentist as soon as possible. Call tomorrow to schedule an appointment. Use your pain medication as prescribed and do not operate heavy machinery while on pain medication. Note that your pain medication contains acetaminophen (Tylenol) & its is not recommended that you use additional acetaminophen (Tylenol) while taking this medication. Do not take Hydrocodone and Percocet.  You need to take one or the other.  Take your full course of antibiotics. Read the instructions below.  Eat a soft or liquid diet and rinse your mouth out after meals with warm water. You should see a dentist or return here at once if you have increased swelling, increased pain or uncontrolled bleeding from the site of your injury.   SEEK MEDICAL CARE IF:   You have increased pain not controlled with medicines.   You have swelling around your tooth, in your face or neck.   You have bleeding which starts, continues, or gets worse.   You have a fever >101  If you are unable to open your mouth  RESOURCE GUIDE  Dental Problems  Patients with Medicaid: [facility] [id number]                                           [phone] [address] Phone:  [phone]                                                  Phone:  [phone]  If unable to pay or uninsured, contact:  [facility] or [facility]. to become qualified for the adult dental clinic.  Chronic Pain Problems Contact [facility]  [phone] Patients need to be referred by their primary care doctor.  Insufficient Money for Medicine Contact [employer]:  call "211" or [facility] [phone].  No Primary Care Doctor Call [facility]  [phone] Other agencies that provide inexpensive  medical care    [facility]  [phone]    [facility]  [phone]    [facility]  [phone]    [facility]  [phone]    [facility]  [phone]    [facility]  [phone]  Psychological Services [facility]  [phone] [facility]  [phone] [facility]   [phone] (emergency services [phone])  Substance Abuse Resources [facility]  [phone] [facility] [phone] [facility] [phone] Floydene Flock [phone] Residential & Outpatient Substance Abuse Program  [phone]  Abuse/Neglect [facility] Child Abuse Hotline 934-587-5632 [facility] Child Abuse Hotline 812-461-5801 (After Hours)  Emergency Shelter [facility] [phone]  Maternity Homes [facility] [phone] [facility] [phone]  MRSA Hotline #:   484-234-3786    [facility]  [facility] Dept. [address] [place]                       [address]      [address]  Blondell Reveal Phone:  [phone]                                   Phone:  [phone]                 Phone:  [phone]  [facility] Phone:  [phone]  [facility] Child Abuse Hotline 534-430-7546 [phone] (After Hours)

## 2014-07-14 NOTE — ED Provider Notes (Signed)
CSN: [id number]     Arrival date & time 07/14/14  1528 History  This chart was scribed for non-physician practitioner, Santiago Glad, PA-C, working with Juliet Rude. Rubin Payor, MD, by Bronson Curb, ED Scribe. This patient was seen in room WTR6/WTR6 and the patient's care was started at 4:39 PM.    Chief Complaint  Patient presents with  . Dental Pain    The history is provided by the patient. No language interpreter was used.     HPI Comments: Regina Patton is a 43 y.o. female who presents to the Emergency Department complaining of gradually worsening, left, lower dental pain that began 2 days ago. Patient denies any injury or trauma, but states there is a "hole" in her tooth. There is associated mild left sided facial swelling. Patient also notes a fever of 100.3 F, but states it has resolved. Patient has taken ibuprofen, Tylenol, and Vicodin 7.5 without significant improvement. She denies nausea, vomiting, and difficulty swallowing.  Patient is established with a dentist (Dr. Aileen Fass) , but is not able to be seen until next week.   Past Medical History  Diagnosis Date  . Rheumatoid arthritis   . Hypertension    Past Surgical History  Procedure Laterality Date  . Tubal ligation    . Bladder surgery     No family history on file. History  Substance Use Topics  . Smoking status: Never Smoker   . Smokeless tobacco: Not on file  . Alcohol Use: No   OB History    No data available     Review of Systems  Constitutional: Negative for fever.  HENT: Positive for dental problem and facial swelling. Negative for trouble swallowing.   Musculoskeletal: Negative for neck stiffness.      Allergies  Penicillins  Home Medications   Prior to Admission medications   Medication Sig Start Date End Date Taking? Authorizing Provider  busPIRone (BUSPAR) 7.5 MG tablet Take 7.5 mg by mouth daily.    Historical Provider, MD  HYDROcodone-acetaminophen (NORCO) 7.5-325 MG per tablet Take 1  tablet by mouth every 6 (six) hours as needed for moderate pain (pain).    Historical Provider, MD  HYDROcodone-acetaminophen (NORCO/VICODIN) 5-325 MG per tablet Take 1 tablet by mouth every 6 (six) hours as needed for moderate pain. 09/12/13   Arman Filter, NP  HYDROcodone-acetaminophen (NORCO/VICODIN) 5-325 MG per tablet Take 1 tablet by mouth every 6 (six) hours as needed for moderate pain or severe pain. 02/26/14   Marissa Sciacca, PA-C  meloxicam (MOBIC) 7.5 MG tablet Take 7.5 mg by mouth 3 (three) times daily.    Historical Provider, MD  methocarbamol (ROBAXIN) 500 MG tablet Take 1 tablet (500 mg total) by mouth every 8 (eight) hours as needed for muscle spasms. 02/26/14   Marissa Sciacca, PA-C  sulfamethoxazole-trimethoprim (BACTRIM DS) 800-160 MG per tablet Take 1 tablet by mouth 2 (two) times daily. 09/12/13   Arman Filter, NP  venlafaxine XR (EFFEXOR-XR) 75 MG 24 hr capsule Take 75 mg by mouth 3 (three) times daily.    Historical Provider, MD   Triage Vitals: BP 159/110 mmHg  Pulse 95  Temp(Src) 98.7 F (37.1 C) (Oral)  Resp 16  SpO2 95%  Physical Exam  Constitutional: She is oriented to person, place, and time. She appears well-developed and well-nourished. No distress.  HENT:  Head: Normocephalic and atraumatic.  Mouth/Throat: No trismus in the jaw. No dental abscesses.  No sublingual tenderness or swelling. TTP of the left lower  gingiva. No obvious dental abscess. No trismus.  Eyes: Conjunctivae and EOM are normal.  Neck: Normal range of motion. Neck supple. No tracheal deviation present.  Cardiovascular: Normal rate, regular rhythm and normal heart sounds.   Pulmonary/Chest: Effort normal and breath sounds normal. No respiratory distress.  Musculoskeletal: Normal range of motion.  Lymphadenopathy:  No submental or submandibular lymphadenopathy.  Neurological: She is alert and oriented to person, place, and time.  Skin: Skin is warm and dry.  Psychiatric: She has a normal mood  and affect. Her behavior is normal.  Nursing note and vitals reviewed.   ED Course  Procedures (including critical care time)  DIAGNOSTIC STUDIES: Oxygen Saturation is 95% on room air, adequate by my interpretation.    COORDINATION OF CARE: At 1644 Discussed treatment plan with patient which includes ABX, Percocet, and follow-up with dentist. Patient agrees.   Labs Review Labs Reviewed - No data to display  Imaging Review No results found.   EKG Interpretation None      MDM   Final diagnoses:  None   Patient with toothache.  No gross abscess.  Exam unconcerning for Ludwig's angina or spread of infection.  Will treat with penicillin and pain medicine.  Urged patient to follow-up with dentist.      Santiago Glad, PA-C 07/16/14 0001  Juliet Rude. Rubin Payor, MD 07/16/14 0025

## 2014-08-08 ENCOUNTER — Encounter: Payer: Self-pay | Admitting: *Deleted

## 2014-08-09 ENCOUNTER — Other Ambulatory Visit: Payer: Self-pay | Admitting: Obstetrics & Gynecology

## 2014-08-16 ENCOUNTER — Encounter: Payer: Self-pay | Admitting: Obstetrics & Gynecology

## 2014-08-16 ENCOUNTER — Other Ambulatory Visit: Payer: Self-pay | Admitting: Obstetrics & Gynecology

## 2014-09-01 ENCOUNTER — Emergency Department (HOSPITAL_COMMUNITY)
Admission: EM | Admit: 2014-09-01 | Discharge: 2014-09-01 | Disposition: A | Discharge status: Home / Self Care | Payer: Medicare Other | Attending: Emergency Medicine | Admitting: Emergency Medicine

## 2014-09-01 ENCOUNTER — Encounter (HOSPITAL_COMMUNITY): Payer: Self-pay | Admitting: Emergency Medicine

## 2014-09-01 DIAGNOSIS — Z791 Long term (current) use of non-steroidal anti-inflammatories (NSAID): Secondary | ICD-10-CM | POA: Diagnosis not present

## 2014-09-01 DIAGNOSIS — Z880 Allergy status to penicillin: Secondary | ICD-10-CM | POA: Diagnosis not present

## 2014-09-01 DIAGNOSIS — Z79899 Other long term (current) drug therapy: Secondary | ICD-10-CM | POA: Diagnosis not present

## 2014-09-01 DIAGNOSIS — M5431 Sciatica, right side: Secondary | ICD-10-CM | POA: Diagnosis not present

## 2014-09-01 DIAGNOSIS — M069 Rheumatoid arthritis, unspecified: Secondary | ICD-10-CM | POA: Insufficient documentation

## 2014-09-01 DIAGNOSIS — I10 Essential (primary) hypertension: Secondary | ICD-10-CM | POA: Insufficient documentation

## 2014-09-01 DIAGNOSIS — Z792 Long term (current) use of antibiotics: Secondary | ICD-10-CM | POA: Diagnosis not present

## 2014-09-01 DIAGNOSIS — G8929 Other chronic pain: Secondary | ICD-10-CM | POA: Insufficient documentation

## 2014-09-01 DIAGNOSIS — M545 Low back pain: Secondary | ICD-10-CM | POA: Diagnosis present

## 2014-09-01 MED ORDER — PREDNISONE 10 MG PO TABS: ORAL_TABLET | ORAL | Status: DC

## 2014-09-01 MED ORDER — CYCLOBENZAPRINE HCL 10 MG PO TABS
10.0000 mg | ORAL_TABLET | Freq: Once | ORAL | Status: AC
  Administered 2014-09-01: 10 mg via ORAL
  Filled 2014-09-01: qty 1

## 2014-09-01 MED ORDER — OXYCODONE-ACETAMINOPHEN 5-325 MG PO TABS: 1.0000 | ORAL_TABLET | ORAL | Status: DC | PRN

## 2014-09-01 MED ORDER — OXYCODONE-ACETAMINOPHEN 5-325 MG PO TABS
1.0000 | ORAL_TABLET | Freq: Once | ORAL | Status: AC
  Administered 2014-09-01: 1 via ORAL
  Filled 2014-09-01: qty 1

## 2014-09-01 MED ORDER — CYCLOBENZAPRINE HCL 10 MG PO TABS: 10.0000 mg | ORAL_TABLET | Freq: Three times a day (TID) | ORAL | Status: DC | PRN

## 2014-09-01 NOTE — Discharge Instructions (Signed)
Sciatica Sciatica is pain, weakness, numbness, or tingling along the path of the sciatic nerve. The nerve starts in the lower back and runs down the back of each leg. The nerve controls the muscles in the lower leg and in the back of the knee, while also providing sensation to the back of the thigh, lower leg, and the sole of your foot. Sciatica is a symptom of another medical condition. For instance, nerve damage or certain conditions, such as a herniated disk or bone spur on the spine, pinch or put pressure on the sciatic nerve. This causes the pain, weakness, or other sensations normally associated with sciatica. Generally, sciatica only affects one side of the body. CAUSES   Herniated or slipped disc.  Degenerative disk disease.  A pain disorder involving the narrow muscle in the buttocks (piriformis syndrome).  Pelvic injury or fracture.  Pregnancy.  Tumor (rare). SYMPTOMS  Symptoms can vary from mild to very severe. The symptoms usually travel from the low back to the buttocks and down the back of the leg. Symptoms can include:  Mild tingling or dull aches in the lower back, leg, or hip.  Numbness in the back of the calf or sole of the foot.  Burning sensations in the lower back, leg, or hip.  Sharp pains in the lower back, leg, or hip.  Leg weakness.  Severe back pain inhibiting movement. These symptoms may get worse with coughing, sneezing, laughing, or prolonged sitting or standing. Also, being overweight may worsen symptoms. DIAGNOSIS  Your caregiver will perform a physical exam to look for common symptoms of sciatica. He or she may ask you to do certain movements or activities that would trigger sciatic nerve pain. Other tests may be performed to find the cause of the sciatica. These may include:  Blood tests.  X-rays.  Imaging tests, such as an MRI or CT scan. TREATMENT  Treatment is directed at the cause of the sciatic pain. Sometimes, treatment is not necessary  and the pain and discomfort goes away on its own. If treatment is needed, your caregiver may suggest:  Over-the-counter medicines to relieve pain.  Prescription medicines, such as anti-inflammatory medicine, muscle relaxants, or narcotics.  Applying heat or ice to the painful area.  Steroid injections to lessen pain, irritation, and inflammation around the nerve.  Reducing activity during periods of pain.  Exercising and stretching to strengthen your abdomen and improve flexibility of your spine. Your caregiver may suggest losing weight if the extra weight makes the back pain worse.  Physical therapy.  Surgery to eliminate what is pressing or pinching the nerve, such as a bone spur or part of a herniated disk. HOME CARE INSTRUCTIONS   Only take over-the-counter or prescription medicines for pain or discomfort as directed by your caregiver.  Apply ice to the affected area for 20 minutes, 3-4 times a day for the first 48-72 hours. Then try heat in the same way.  Exercise, stretch, or perform your usual activities if these do not aggravate your pain.  Attend physical therapy sessions as directed by your caregiver.  Keep all follow-up appointments as directed by your caregiver.  Do not wear high heels or shoes that do not provide proper support.  Check your mattress to see if it is too soft. A firm mattress may lessen your pain and discomfort. SEEK IMMEDIATE MEDICAL CARE IF:   You lose control of your bowel or bladder (incontinence).  You have increasing weakness in the lower back, pelvis, buttocks,   or legs.  You have redness or swelling of your back.  You have a burning sensation when you urinate.  You have pain that gets worse when you lie down or awakens you at night.  Your pain is worse than you have experienced in the past.  Your pain is lasting longer than 4 weeks.  You are suddenly losing weight without reason. MAKE SURE YOU:  Understand these  instructions.  Will watch your condition.  Will get help right away if you are not doing well or get worse. Document Released: 08/18/2001 Document Revised: 02/23/2012 Document Reviewed: 01/03/2012 Exit[employer] Patient Information 2015 [employer]. This information is not intended to replace advice given to you by your health care provider. Make sure you discuss any questions you have with your health care provider.  

## 2014-09-01 NOTE — ED Notes (Addendum)
Pt reports right low back pain that radiates down to her leg and foot. Pt described pain as burning. Pt as strong pulse in right foot. Cap refill<3 sec. Pt has hx of sciatica.

## 2014-09-01 NOTE — ED Notes (Signed)
Sudden onset lumbar spine pain w/sharp pain radiating down back of R leg w/numbness/tingling to foot. Dorsiflexion and plantar flexion causes increased pain.  Numbness on ventral surface of R thigh.  Denies any injury, excessive twisting, bending or heavy lifting.  Nailbeds of of R foot slightly blue, patient states they were worse last night. DP 2+.

## 2014-09-01 NOTE — ED Notes (Signed)
Patient with no complaints at this time. Respirations even and unlabored. Skin warm/dry. Discharge instructions reviewed with patient at this time. Patient given opportunity to voice concerns/ask questions. Patient discharged at this time and left Emergency Department with steady gait.   

## 2014-09-01 NOTE — ED Provider Notes (Signed)
CSN: [id number]     Arrival date & time 09/01/14  1227 History   First MD Initiated Contact with Patient 09/01/14 1423     Chief Complaint  Patient presents with  . Back Pain     (Consider location/radiation/quality/duration/timing/severity/associated sxs/prior Treatment) HPI   Regina Patton is a 43 y.o. female who presents to the Emergency Department complaining of recurrent low back pain and sharp, burning pain that radiates from her right lower back and hip into her lateral and frontal thigh and lower leg.  She states the pain radiating into her leg is new.  She reports having numbness and tingling sensation to her thigh that is intermittent.  Symptoms are worse with sitting and improve with standing.  She states that she normally take hydrocodone for her back pain, but it has not helped with her current symptoms. She also reports noticing a discoloration of her toenails of both feet.  She denies injury, dysuria, fever, chills, pelvic numbness, abdominal pain, bladder or bowel changes or cold sensation of her feet.      Past Medical History  Diagnosis Date  . Rheumatoid arthritis   . Hypertension    Past Surgical History  Procedure Laterality Date  . Tubal ligation    . Bladder surgery     No family history on file. History  Substance Use Topics  . Smoking status: Never Smoker   . Smokeless tobacco: Not on file  . Alcohol Use: No   OB History    No data available     Review of Systems  Constitutional: Negative for fever.  Respiratory: Negative for shortness of breath.   Gastrointestinal: Negative for vomiting, abdominal pain and constipation.  Genitourinary: Negative for dysuria, hematuria, flank pain, decreased urine volume, difficulty urinating and pelvic pain.       Low back pain  Musculoskeletal: Positive for back pain. Negative for joint swelling and gait problem.  Skin: Negative for rash.  Neurological: Negative for weakness and numbness.  All other systems  reviewed and are negative.     Allergies  Penicillins; Toradol; and Tramadol  Home Medications   Prior to Admission medications   Medication Sig Start Date End Date Taking? Authorizing Provider  busPIRone (BUSPAR) 7.5 MG tablet Take 7.5 mg by mouth daily.    Historical Provider, MD  clindamycin (CLEOCIN) 150 MG capsule Take 3 capsules (450 mg total) by mouth 3 (three) times daily. 07/14/14   Heather Laisure, PA-C  HYDROcodone-acetaminophen (NORCO) 7.5-325 MG per tablet Take 1 tablet by mouth every 6 (six) hours as needed for moderate pain (pain).    Historical Provider, MD  HYDROcodone-acetaminophen (NORCO/VICODIN) 5-325 MG per tablet Take 1 tablet by mouth every 6 (six) hours as needed for moderate pain. 09/12/13   Arman Filter, NP  HYDROcodone-acetaminophen (NORCO/VICODIN) 5-325 MG per tablet Take 1 tablet by mouth every 6 (six) hours as needed for moderate pain or severe pain. 02/26/14   Marissa Sciacca, PA-C  meloxicam (MOBIC) 7.5 MG tablet Take 7.5 mg by mouth 3 (three) times daily.    Historical Provider, MD  methocarbamol (ROBAXIN) 500 MG tablet Take 1 tablet (500 mg total) by mouth every 8 (eight) hours as needed for muscle spasms. 02/26/14   Marissa Sciacca, PA-C  oxyCODONE-acetaminophen (PERCOCET/ROXICET) 5-325 MG per tablet Take 1-2 tablets by mouth every 6 (six) hours as needed for severe pain. 07/14/14   Heather Laisure, PA-C  sulfamethoxazole-trimethoprim (BACTRIM DS) 800-160 MG per tablet Take 1 tablet by mouth 2 (two)  times daily. 09/12/13   Arman Filter, NP  venlafaxine XR (EFFEXOR-XR) 75 MG 24 hr capsule Take 75 mg by mouth 3 (three) times daily.    Historical Provider, MD   BP 145/59 mmHg  Pulse 97  Temp(Src) 97.6 F (36.4 C) (Oral)  Resp 20  Ht 5' (1.524 m)  Wt 174 lb (78.926 kg)  BMI 33.98 kg/m2  SpO2 99%  LMP 09/01/2014 Physical Exam  Constitutional: She is oriented to person, place, and time. She appears well-developed and well-nourished. No distress.  HENT:   Head: Normocephalic and atraumatic.  Neck: Normal range of motion. Neck supple.  Cardiovascular: Normal rate, regular rhythm, normal heart sounds and intact distal pulses.   No murmur heard. Pulmonary/Chest: Effort normal and breath sounds normal. No respiratory distress.  Abdominal: Soft. She exhibits no distension. There is no tenderness.  Musculoskeletal: She exhibits tenderness. She exhibits no edema.       Lumbar back: She exhibits tenderness and pain. She exhibits normal range of motion, no swelling, no deformity, no laceration and normal pulse.  ttp of the right lumbar paraspinal muscles and SI joint.   DP pulses are brisk and symmetrical.  Distal sensation intact.  Hip Flexors/Extensors are intact.  Pt has 5/5 strength against resistance of bilateral lower extremities. Bilateral feet are warm and pink, no edema.  Nailbeds are slightly discolored, but CR is brisk , <3 sec    Neurological: She is alert and oriented to person, place, and time. She has normal strength. No sensory deficit. She exhibits normal muscle tone. Coordination and gait normal.  Reflex Scores:      Patellar reflexes are 2+ on the right side and 2+ on the left side.      Achilles reflexes are 2+ on the right side and 2+ on the left side. Skin: Skin is warm and dry. No rash noted.  Nursing note and vitals reviewed.   ED Course  Procedures (including critical care time) Labs Review Labs Reviewed - No data to display  Imaging Review No results found.   EKG Interpretation None      MDM   Final diagnoses:  Sciatica, right    Bilateral feet are warm and pink.  DP and PT pulses are brisk, distal sensation intact.  Pt is ambulatory with a steady gait.  No focal neuro deficits on exam.  Sx's likely related to sciatica.  No concerning sx's for emergent neurological or infectious process.  Has hx of chronic back pain.  She agrees to tx with muscle relaxer, pain medication and prednisone taper.  Advised to return  here immed if the sx's worsen or follow-up with her PMD.    Laiba Fuerte L. Trisha Mangle, PA-C 09/02/14 1741  Flint Melter, MD 09/03/14 [phone]

## 2014-11-29 ENCOUNTER — Emergency Department (HOSPITAL_COMMUNITY)
Admission: EM | Admit: 2014-11-29 | Discharge: 2014-11-29 | Disposition: A | Discharge status: Home / Self Care | Payer: Medicare Other | Attending: Emergency Medicine | Admitting: Emergency Medicine

## 2014-11-29 ENCOUNTER — Encounter (HOSPITAL_COMMUNITY): Payer: Self-pay | Admitting: Emergency Medicine

## 2014-11-29 DIAGNOSIS — I10 Essential (primary) hypertension: Secondary | ICD-10-CM | POA: Insufficient documentation

## 2014-11-29 DIAGNOSIS — A5901 Trichomonal vulvovaginitis: Secondary | ICD-10-CM | POA: Insufficient documentation

## 2014-11-29 DIAGNOSIS — Z880 Allergy status to penicillin: Secondary | ICD-10-CM | POA: Insufficient documentation

## 2014-11-29 DIAGNOSIS — R000 Tachycardia, unspecified: Secondary | ICD-10-CM | POA: Insufficient documentation

## 2014-11-29 DIAGNOSIS — M069 Rheumatoid arthritis, unspecified: Secondary | ICD-10-CM | POA: Insufficient documentation

## 2014-11-29 DIAGNOSIS — Z792 Long term (current) use of antibiotics: Secondary | ICD-10-CM | POA: Insufficient documentation

## 2014-11-29 DIAGNOSIS — N760 Acute vaginitis: Secondary | ICD-10-CM | POA: Insufficient documentation

## 2014-11-29 DIAGNOSIS — Z79899 Other long term (current) drug therapy: Secondary | ICD-10-CM | POA: Insufficient documentation

## 2014-11-29 DIAGNOSIS — N898 Other specified noninflammatory disorders of vagina: Secondary | ICD-10-CM | POA: Diagnosis present

## 2014-11-29 DIAGNOSIS — B9689 Other specified bacterial agents as the cause of diseases classified elsewhere: Secondary | ICD-10-CM

## 2014-11-29 DIAGNOSIS — Z791 Long term (current) use of non-steroidal anti-inflammatories (NSAID): Secondary | ICD-10-CM | POA: Insufficient documentation

## 2014-11-29 DIAGNOSIS — A599 Trichomoniasis, unspecified: Secondary | ICD-10-CM

## 2014-11-29 LAB — WET PREP, GENITAL: Yeast Wet Prep HPF POC: NONE SEEN

## 2014-11-29 LAB — URINALYSIS, ROUTINE W REFLEX MICROSCOPIC
BILIRUBIN URINE: NEGATIVE
Glucose, UA: NEGATIVE mg/dL
Ketones, ur: NEGATIVE mg/dL
Nitrite: NEGATIVE
PROTEIN: NEGATIVE mg/dL
SPECIFIC GRAVITY, URINE: 1.02 (ref 1.005–1.030)
Urobilinogen, UA: 0.2 mg/dL (ref 0.0–1.0)
pH: 8.5 — ABNORMAL HIGH (ref 5.0–8.0)

## 2014-11-29 LAB — URINE MICROSCOPIC-ADD ON

## 2014-11-29 MED ORDER — HYDROCODONE-ACETAMINOPHEN 5-325 MG PO TABS
2.0000 | ORAL_TABLET | Freq: Once | ORAL | Status: AC
  Administered 2014-11-29: 2 via ORAL
  Filled 2014-11-29: qty 2

## 2014-11-29 MED ORDER — ONDANSETRON HCL 4 MG PO TABS
4.0000 mg | ORAL_TABLET | Freq: Once | ORAL | Status: AC
  Administered 2014-11-29: 4 mg via ORAL
  Filled 2014-11-29: qty 1

## 2014-11-29 MED ORDER — AZITHROMYCIN 250 MG PO TABS
2000.0000 mg | ORAL_TABLET | Freq: Once | ORAL | Status: AC
  Administered 2014-11-29: 2000 mg via ORAL
  Filled 2014-11-29: qty 8

## 2014-11-29 MED ORDER — METRONIDAZOLE 500 MG PO TABS
500.0000 mg | ORAL_TABLET | Freq: Once | ORAL | Status: AC
  Administered 2014-11-29: 500 mg via ORAL
  Filled 2014-11-29: qty 1

## 2014-11-29 MED ORDER — METRONIDAZOLE 500 MG PO TABS: 500.0000 mg | ORAL_TABLET | Freq: Two times a day (BID) | ORAL | Status: DC

## 2014-11-29 MED ORDER — HYDROCODONE-ACETAMINOPHEN 5-325 MG PO TABS
1.0000 | ORAL_TABLET | ORAL | Status: DC | PRN
Start: 2014-11-29 — End: 2015-01-08

## 2014-11-29 NOTE — ED Notes (Signed)
PT noticed red/raised and painful area to left pelvic area x2 days ago. PT denies any fever or drainage from abscess.

## 2014-11-29 NOTE — ED Provider Notes (Signed)
CSN: [id number]     Arrival date & time 11/29/14  1142 History   First MD Initiated Contact with Patient 11/29/14 1212     Chief Complaint  Patient presents with  . Abscess     (Consider location/radiation/quality/duration/timing/severity/associated sxs/prior Treatment) Patient is a 44 y.o. female presenting with abscess. The history is provided by the patient.  Abscess Location:  Ano-genital Ano-genital abscess location:  Vagina Abscess quality: painful and warmth   Abscess quality: not draining   Red streaking: no   Duration:  2 days Progression:  Worsening Pain details:    Quality:  Pressure and throbbing   Severity:  Moderate   Duration:  2 days   Timing:  Intermittent   Progression:  Worsening Chronicity:  New Context: not diabetes and not immunosuppression   Relieved by:  Nothing Ineffective treatments:  None tried Associated symptoms: no fever and no vomiting   Risk factors: no hx of MRSA     Past Medical History  Diagnosis Date  . Rheumatoid arthritis   . Hypertension    Past Surgical History  Procedure Laterality Date  . Tubal ligation    . Bladder surgery     History reviewed. No pertinent family history. History  Substance Use Topics  . Smoking status: Never Smoker   . Smokeless tobacco: Not on file  . Alcohol Use: No   OB History    Gravida Para Term Preterm AB TAB SAB Ectopic Multiple Living            3     Review of Systems  Constitutional: Negative for fever and activity change.       All ROS Neg except as noted in HPI  HENT: Negative for nosebleeds.   Eyes: Negative for photophobia and discharge.  Respiratory: Negative for cough, shortness of breath and wheezing.   Cardiovascular: Negative for chest pain and palpitations.  Gastrointestinal: Negative for vomiting, abdominal pain and blood in stool.  Genitourinary: Positive for vaginal discharge and vaginal pain. Negative for dysuria, frequency and hematuria.  Musculoskeletal: Positive  for arthralgias. Negative for back pain and neck pain.  Skin: Negative.   Neurological: Negative for dizziness, seizures and speech difficulty.  Psychiatric/Behavioral: Negative for hallucinations and confusion.      Allergies  Penicillins; Toradol; and Tramadol  Home Medications   Prior to Admission medications   Medication Sig Start Date End Date Taking? Authorizing Provider  busPIRone (BUSPAR) 7.5 MG tablet Take 7.5 mg by mouth daily.    Historical Provider, MD  clindamycin (CLEOCIN) 150 MG capsule Take 3 capsules (450 mg total) by mouth 3 (three) times daily. 07/14/14   Heather Laisure, PA-C  cyclobenzaprine (FLEXERIL) 10 MG tablet Take 1 tablet (10 mg total) by mouth 3 (three) times daily as needed. 09/01/14   Tammi Triplett, PA-C  HYDROcodone-acetaminophen (NORCO) 7.5-325 MG per tablet Take 1 tablet by mouth every 6 (six) hours as needed for moderate pain (pain).    Historical Provider, MD  HYDROcodone-acetaminophen (NORCO/VICODIN) 5-325 MG per tablet Take 1 tablet by mouth every 6 (six) hours as needed for moderate pain. 09/12/13   Earley Favor, NP  HYDROcodone-acetaminophen (NORCO/VICODIN) 5-325 MG per tablet Take 1 tablet by mouth every 6 (six) hours as needed for moderate pain or severe pain. 02/26/14   Marissa Sciacca, PA-C  meloxicam (MOBIC) 7.5 MG tablet Take 7.5 mg by mouth 3 (three) times daily.    Historical Provider, MD  methocarbamol (ROBAXIN) 500 MG tablet Take 1 tablet (500 mg total)  by mouth every 8 (eight) hours as needed for muscle spasms. 02/26/14   Marissa Sciacca, PA-C  oxyCODONE-acetaminophen (PERCOCET/ROXICET) 5-325 MG per tablet Take 1 tablet by mouth every 4 (four) hours as needed. 09/01/14   Tammi Triplett, PA-C  predniSONE (DELTASONE) 10 MG tablet Take 6 tablets day one, 5 tablets day two, 4 tablets day three, 3 tablets day four, 2 tablets day five, then 1 tablet day six Patient not taking: Reported on 11/29/2014 09/01/14   Tammi Triplett, PA-C   sulfamethoxazole-trimethoprim (BACTRIM DS) 800-160 MG per tablet Take 1 tablet by mouth 2 (two) times daily. Patient not taking: Reported on 11/29/2014 09/12/13   Earley Favor, NP  venlafaxine XR (EFFEXOR-XR) 75 MG 24 hr capsule Take 75 mg by mouth 3 (three) times daily.    Historical Provider, MD   BP 143/98 mmHg  Pulse 108  Temp(Src) 98.2 F (36.8 C) (Oral)  Resp 16  Ht 5' (1.524 m)  Wt 208 lb (94.348 kg)  BMI 40.62 kg/m2  SpO2 95%  LMP 11/22/2014 Physical Exam  Constitutional: She is oriented to person, place, and time. She appears well-developed and well-nourished.  Non-toxic appearance.  HENT:  Head: Normocephalic.  Right Ear: Tympanic membrane and external ear normal.  Left Ear: Tympanic membrane and external ear normal.  Eyes: EOM and lids are normal. Pupils are equal, round, and reactive to light.  Neck: Normal range of motion. Neck supple. Carotid bruit is not present.  Cardiovascular: Regular rhythm, normal heart sounds, intact distal pulses and normal pulses.  Tachycardia present.   Pulmonary/Chest: Breath sounds normal. No respiratory distress.  Abdominal: Soft. Bowel sounds are normal. There is no tenderness. There is no guarding.  Genitourinary:  Chaperone present during the examination. There is tenderness and increased redness of the inner aspect of the external labia on the left. There is a white vaginal discharge present,. There is some soreness of the vaginal vault. No mass appreciated in the vaginal vault.  Musculoskeletal: Normal range of motion.  Lymphadenopathy:       Head (right side): No submandibular adenopathy present.       Head (left side): No submandibular adenopathy present.    She has no cervical adenopathy.  Neurological: She is alert and oriented to person, place, and time. She has normal strength. No cranial nerve deficit or sensory deficit.  Skin: Skin is warm and dry.  Psychiatric: She has a normal mood and affect. Her speech is normal.  Nursing  note and vitals reviewed.   ED Course  Procedures (including critical care time) Labs Review Labs Reviewed - No data to display  Imaging Review No results found.   EKG Interpretation None      MDM   Patient is allergic to penicillin (anaphylaxis). Will use 2000 mg  azithromycin to cover gonorrhea and Chlamydia. Prescription for Flagyl given to the patient. Prescription for Norco given for the patient to use for pain. Patient will also use warm tub soaks. Patient to see her primary physician for recheck in about 10 days. Pt will have her pardner see MD to be checked.   Final diagnoses:  None    *I have reviewed nursing notes, vital signs, and all appropriate lab and imaging results for this patient.[address], PA-C 11/30/14 2005  Rolland Porter, MD 12/14/14 [phone]

## 2014-11-29 NOTE — Discharge Instructions (Signed)
Your tests reveals bacterial vaginosis, and Trichomonas infection. Please use the Flagyl 2 times daily, and Tylenol every 4 hours for mild pain. May use Norco for more severe pain. You were treated today with Zithromax. Please see Dr. Jeannetta Nap for recheck of your bacterial vaginosis, and Trichomonas in about 10 days. Please have your sexual partner to be evaluated and treated as well. Bacterial Vaginosis Bacterial vaginosis is a vaginal infection that occurs when the normal balance of bacteria in the vagina is disrupted. It results from an overgrowth of certain bacteria. This is the most common vaginal infection in women of childbearing age. Treatment is important to prevent complications, especially in pregnant women, as it can cause a premature delivery. CAUSES  Bacterial vaginosis is caused by an increase in harmful bacteria that are normally present in smaller amounts in the vagina. Several different kinds of bacteria can cause bacterial vaginosis. However, the reason that the condition develops is not fully understood. RISK FACTORS Certain activities or behaviors can put you at an increased risk of developing bacterial vaginosis, including:  Having a new sex partner or multiple sex partners.  Douching.  Using an intrauterine device (IUD) for contraception. Women do not get bacterial vaginosis from toilet seats, bedding, swimming pools, or contact with objects around them. SIGNS AND SYMPTOMS  Some women with bacterial vaginosis have no signs or symptoms. Common symptoms include:  Grey vaginal discharge.  A fishlike odor with discharge, especially after sexual intercourse.  Itching or burning of the vagina and vulva.  Burning or pain with urination. DIAGNOSIS  Your health care provider will take a medical history and examine the vagina for signs of bacterial vaginosis. A sample of vaginal fluid may be taken. Your health care provider will look at this sample under a microscope to check for  bacteria and abnormal cells. A vaginal pH test may also be done.  TREATMENT  Bacterial vaginosis may be treated with antibiotic medicines. These may be given in the form of a pill or a vaginal cream. A second round of antibiotics may be prescribed if the condition comes back after treatment.  HOME CARE INSTRUCTIONS   Only take over-the-counter or prescription medicines as directed by your health care provider.  If antibiotic medicine was prescribed, take it as directed. Make sure you finish it even if you start to feel better.  Do not have sex until treatment is completed.  Tell all sexual partners that you have a vaginal infection. They should see their health care provider and be treated if they have problems, such as a mild rash or itching.  Practice safe sex by using condoms and only having one sex partner. SEEK MEDICAL CARE IF:   Your symptoms are not improving after 3 days of treatment.  You have increased discharge or pain.  You have a fever. MAKE SURE YOU:   Understand these instructions.  Will watch your condition.  Will get help right away if you are not doing well or get worse. FOR MORE INFORMATION  Centers for Disease Control and Prevention, Division of STD Prevention: [employer] American Sexual Health Association (ASHA): www.ashastd.org  Document Released: 08/24/2005 Document Revised: 06/14/2013 Document Reviewed: 04/05/2013 [facility] Patient Information 2015 [place], [place]. This information is not intended to replace advice given to you by your health care provider. Make sure you discuss any questions you have with your health care provider.  Trichomoniasis Trichomoniasis is an infection caused by an organism called Trichomonas. The infection can affect both women and men. In  women, the outer female genitalia and the vagina are affected. In men, the penis is mainly affected, but the prostate and other reproductive organs can also be involved. Trichomoniasis is a  sexually transmitted infection (STI) and is most often passed to another person through sexual contact.  RISK FACTORS  Having unprotected sexual intercourse.  Having sexual intercourse with an infected partner. SIGNS AND SYMPTOMS  Symptoms of trichomoniasis in women include:  Abnormal gray-green frothy vaginal discharge.  Itching and irritation of the vagina.  Itching and irritation of the area outside the vagina. Symptoms of trichomoniasis in men include:   Penile discharge with or without pain.  Pain during urination. This results from inflammation of the urethra. DIAGNOSIS  Trichomoniasis may be found during a Pap test or physical exam. Your health care provider may use one of the following methods to help diagnose this infection:  Examining vaginal discharge under a microscope. For men, urethral discharge would be examined.  Testing the pH of the vagina with a test tape.  Using a vaginal swab test that checks for the Trichomonas organism. A test is available that provides results within a few minutes.  Doing a culture test for the organism. This is not usually needed. TREATMENT   You may be given medicine to fight the infection. Women should inform their health care provider if they could be or are pregnant. Some medicines used to treat the infection should not be taken during pregnancy.  Your health care provider may recommend over-the-counter medicines or creams to decrease itching or irritation.  Your sexual partner will need to be treated if infected. HOME CARE INSTRUCTIONS   Take medicines only as directed by your health care provider.  Take over-the-counter medicine for itching or irritation as directed by your health care provider.  Do not have sexual intercourse while you have the infection.  Women should not douche or wear tampons while they have the infection.  Discuss your infection with your partner. Your partner may have gotten the infection from you, or  you may have gotten it from your partner.  Have your sex partner get examined and treated if necessary.  Practice safe, informed, and protected sex.  See your health care provider for other STI testing. SEEK MEDICAL CARE IF:   You still have symptoms after you finish your medicine.  You develop abdominal pain.  You have pain when you urinate.  You have bleeding after sexual intercourse.  You develop a rash.  Your medicine makes you sick or makes you throw up (vomit). MAKE SURE YOU:  Understand these instructions.  Will watch your condition.  Will get help right away if you are not doing well or get worse. Document Released: 02/17/2001 Document Revised: 01/08/2014 Document Reviewed: 06/05/2013 [facility] Patient Information 2015 [place], [place]. This information is not intended to replace advice given to you by your health care provider. Make sure you discuss any questions you have with your health care provider.

## 2014-11-30 LAB — GC/CHLAMYDIA PROBE AMP (~~LOC~~) NOT AT ARMC
Chlamydia: NEGATIVE
NEISSERIA GONORRHEA: NEGATIVE

## 2014-12-08 ENCOUNTER — Encounter (HOSPITAL_COMMUNITY): Payer: Self-pay | Admitting: Emergency Medicine

## 2014-12-08 ENCOUNTER — Emergency Department (HOSPITAL_COMMUNITY)
Admission: EM | Admit: 2014-12-08 | Discharge: 2014-12-08 | Disposition: A | Discharge status: Home / Self Care | Payer: Medicare Other | Attending: Emergency Medicine | Admitting: Emergency Medicine

## 2014-12-08 DIAGNOSIS — Z792 Long term (current) use of antibiotics: Secondary | ICD-10-CM | POA: Diagnosis not present

## 2014-12-08 DIAGNOSIS — M069 Rheumatoid arthritis, unspecified: Secondary | ICD-10-CM | POA: Diagnosis not present

## 2014-12-08 DIAGNOSIS — Z880 Allergy status to penicillin: Secondary | ICD-10-CM | POA: Insufficient documentation

## 2014-12-08 DIAGNOSIS — Z791 Long term (current) use of non-steroidal anti-inflammatories (NSAID): Secondary | ICD-10-CM | POA: Diagnosis not present

## 2014-12-08 DIAGNOSIS — M5441 Lumbago with sciatica, right side: Secondary | ICD-10-CM

## 2014-12-08 DIAGNOSIS — M545 Low back pain: Secondary | ICD-10-CM | POA: Diagnosis present

## 2014-12-08 DIAGNOSIS — I10 Essential (primary) hypertension: Secondary | ICD-10-CM | POA: Diagnosis not present

## 2014-12-08 DIAGNOSIS — Z79899 Other long term (current) drug therapy: Secondary | ICD-10-CM | POA: Insufficient documentation

## 2014-12-08 MED ORDER — NAPROXEN 500 MG PO TABS: 500.0000 mg | ORAL_TABLET | Freq: Two times a day (BID) | ORAL | Status: DC

## 2014-12-08 MED ORDER — NAPROXEN 250 MG PO TABS
500.0000 mg | ORAL_TABLET | Freq: Once | ORAL | Status: AC
  Administered 2014-12-08: 500 mg via ORAL
  Filled 2014-12-08: qty 2

## 2014-12-08 MED ORDER — DEXAMETHASONE 6 MG PO TABS: ORAL_TABLET | ORAL | Status: DC

## 2014-12-08 MED ORDER — DEXAMETHASONE SODIUM PHOSPHATE 4 MG/ML IJ SOLN
8.0000 mg | Freq: Once | INTRAMUSCULAR | Status: AC
  Administered 2014-12-08: 8 mg via INTRAMUSCULAR
  Filled 2014-12-08: qty 2

## 2014-12-08 MED ORDER — DIAZEPAM 5 MG PO TABS
10.0000 mg | ORAL_TABLET | Freq: Once | ORAL | Status: AC
Start: 2014-12-08 — End: 2014-12-08
  Administered 2014-12-08: 10 mg via ORAL
  Filled 2014-12-08: qty 2

## 2014-12-08 MED ORDER — CYCLOBENZAPRINE HCL 10 MG PO TABS
10.0000 mg | ORAL_TABLET | Freq: Three times a day (TID) | ORAL | Status: DC
Start: 2014-12-08 — End: 2015-01-20

## 2014-12-08 NOTE — ED Notes (Signed)
Patient complaining of lower back pain radiating into right lower leg. States has had issues with back pain in the past and has been diagnosed with sciatica, but pain has worsened. States "my toes turned blue today."

## 2014-12-08 NOTE — ED Provider Notes (Signed)
CSN: [id number]     Arrival date & time 12/08/14  1922 History   First MD Initiated Contact with Patient 12/08/14 2031     Chief Complaint  Patient presents with  . Back Pain     (Consider location/radiation/quality/duration/timing/severity/associated sxs/prior Treatment) HPI Comments: Patient is a 44 year old female who presents to the emergency department with a complaint of lower back pain that seems to be radiating into her right lower leg. The patient states that she has been having problems with her back for quite some time. She has been told she had arthritis in her back. Today she has pain that seems to be getting worse. He states that it looked as though her toes were purple earlier today. She's not had any injury to her back or legs. There's been no recent operations or procedures. The patient states the she discussed this with her primary physician approximately a month ago and the plan was that she would have an MRI done of her back, but this has not occurred yet. The pain is worse with change of positions. Nothing seems to make the pain better.  The history is provided by the patient.    Past Medical History  Diagnosis Date  . Rheumatoid arthritis   . Hypertension    Past Surgical History  Procedure Laterality Date  . Tubal ligation    . Bladder surgery     History reviewed. No pertinent family history. History  Substance Use Topics  . Smoking status: Never Smoker   . Smokeless tobacco: Not on file  . Alcohol Use: No   OB History    Gravida Para Term Preterm AB TAB SAB Ectopic Multiple Living            3     Review of Systems  Musculoskeletal: Positive for back pain and arthralgias.  All other systems reviewed and are negative.     Allergies  Penicillins; Toradol; and Tramadol  Home Medications   Prior to Admission medications   Medication Sig Start Date End Date Taking? Authorizing Provider  busPIRone (BUSPAR) 7.5 MG tablet Take 7.5 mg by mouth daily.    Yes Historical Provider, MD  HYDROcodone-acetaminophen (NORCO/VICODIN) 5-325 MG per tablet Take 1 tablet by mouth every 4 (four) hours as needed. 11/29/14  Yes Ivery Quale, PA-C  meloxicam (MOBIC) 7.5 MG tablet Take 7.5 mg by mouth 3 (three) times daily.   Yes Historical Provider, MD  metroNIDAZOLE (FLAGYL) 500 MG tablet Take 1 tablet (500 mg total) by mouth 2 (two) times daily. Please take with a meal. 11/29/14  Yes Ivery Quale, PA-C  venlafaxine XR (EFFEXOR-XR) 75 MG 24 hr capsule Take 75 mg by mouth 3 (three) times daily.   Yes Historical Provider, MD  clindamycin (CLEOCIN) 150 MG capsule Take 3 capsules (450 mg total) by mouth 3 (three) times daily. Patient not taking: Reported on 11/29/2014 07/14/14   Santiago Glad, PA-C  cyclobenzaprine (FLEXERIL) 10 MG tablet Take 1 tablet (10 mg total) by mouth 3 (three) times daily as needed. Patient not taking: Reported on 11/29/2014 09/01/14   Tammi Triplett, PA-C  methocarbamol (ROBAXIN) 500 MG tablet Take 1 tablet (500 mg total) by mouth every 8 (eight) hours as needed for muscle spasms. Patient not taking: Reported on 11/29/2014 02/26/14   Marissa Sciacca, PA-C  oxyCODONE-acetaminophen (PERCOCET/ROXICET) 5-325 MG per tablet Take 1 tablet by mouth every 4 (four) hours as needed. Patient not taking: Reported on 11/29/2014 09/01/14   Tammi Triplett, PA-C  predniSONE (DELTASONE)  10 MG tablet Take 6 tablets day one, 5 tablets day two, 4 tablets day three, 3 tablets day four, 2 tablets day five, then 1 tablet day six Patient not taking: Reported on 11/29/2014 09/01/14   Tammi Triplett, PA-C  sulfamethoxazole-trimethoprim (BACTRIM DS) 800-160 MG per tablet Take 1 tablet by mouth 2 (two) times daily. Patient not taking: Reported on 11/29/2014 09/12/13   Earley Favor, NP   BP 139/99 mmHg  Pulse 115  Temp(Src) 98.2 F (36.8 C) (Oral)  Resp 20  Ht 5' (1.524 m)  Wt 208 lb (94.348 kg)  BMI 40.62 kg/m2  SpO2 99%  LMP 12/07/2014 Physical Exam  Constitutional:  She is oriented to person, place, and time. She appears well-developed and well-nourished.  Non-toxic appearance.  HENT:  Head: Normocephalic.  Right Ear: Tympanic membrane and external ear normal.  Left Ear: Tympanic membrane and external ear normal.  Eyes: EOM and lids are normal. Pupils are equal, round, and reactive to light.  Neck: Normal range of motion. Neck supple. Carotid bruit is not present.  Cardiovascular: Normal rate, regular rhythm, normal heart sounds, intact distal pulses and normal pulses.   Pulmonary/Chest: Breath sounds normal. No respiratory distress.  Abdominal: Soft. Bowel sounds are normal. There is no tenderness. There is no guarding.  Musculoskeletal:       Lumbar back: She exhibits decreased range of motion, tenderness and pain. She exhibits no deformity.       Back:  Lymphadenopathy:       Head (right side): No submandibular adenopathy present.       Head (left side): No submandibular adenopathy present.    She has no cervical adenopathy.  Neurological: She is alert and oriented to person, place, and time. She has normal strength. No cranial nerve deficit or sensory deficit.  Skin: Skin is warm and dry.  Psychiatric: She has a normal mood and affect. Her speech is normal.  Nursing note and vitals reviewed.   ED Course  Procedures (including critical care time) Labs Review Labs Reviewed - No data to display  Imaging Review No results found.   EKG Interpretation None      MDM  I have reviewed the films from July 2015. 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 There is no fracture or dislocation noted at that time. The vital signs today are nonacute. There no gross neurologic deficits appreciated. 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 There is no history to suggest caudal equina.  The patient will be asked to use heating pad to the lower back, prescription for Decadron, Flexeril, and  Naprosyn will be given to the patient. The patient is strongly encouraged to see her primary physician Dr. Jeannetta Nap on Monday or Tuesday of next week.    Final diagnoses:  None    *I have reviewed nursing notes, vital signs, and all appropriate lab and imaging results for this patient.**    Ivery Quale, PA-C 12/08/14 2049  Mancel Bale, MD 12/09/14 [phone]

## 2014-12-08 NOTE — Discharge Instructions (Signed)
Please see Dr. Jeannetta Nap Monday or Tuesday of next week to discuss your back pain and management. Heating pad may be helpful. Please use Decadron, Flexeril, and naproxen as prescribed. Flexeril may cause drowsiness, please do not operate machines, drive a vehicle, drink alcohol, or 50 patent activities that require concentration when taking this medication. Please take naproxen with food. Please rest your back is much as possible. Back Pain, Adult Back pain is very common. The pain often gets better over time. The cause of back pain is usually not dangerous. Most people can learn to manage their back pain on their own.  HOME CARE   Stay active. Start with short walks on flat ground if you can. Try to walk farther each day.  Do not sit, drive, or stand in one place for more than 30 minutes. Do not stay in bed.  Do not avoid exercise or work. Activity can help your back heal faster.  Be careful when you bend or lift an object. Bend at your knees, keep the object close to you, and do not twist.  Sleep on a firm mattress. Lie on your side, and bend your knees. If you lie on your back, put a pillow under your knees.  Only take medicines as told by your doctor.  Put ice on the injured area.  Put ice in a plastic bag.  Place a towel between your skin and the bag.  Leave the ice on for 15-20 minutes, 03-04 times a day for the first 2 to 3 days. After that, you can switch between ice and heat packs.  Ask your doctor about back exercises or massage.  Avoid feeling anxious or stressed. Find good ways to deal with stress, such as exercise. GET HELP RIGHT AWAY IF:   Your pain does not go away with rest or medicine.  Your pain does not go away in 1 week.  You have new problems.  You do not feel well.  The pain spreads into your legs.  You cannot control when you poop (bowel movement) or pee (urinate).  Your arms or legs feel weak or lose feeling (numbness).  You feel sick to your stomach  (nauseous) or throw up (vomit).  You have belly (abdominal) pain.  You feel like you may pass out (faint). MAKE SURE YOU:   Understand these instructions.  Will watch your condition.  Will get help right away if you are not doing well or get worse. Document Released: 02/10/2008 Document Revised: 11/16/2011 Document Reviewed: 12/26/2013 [facility] Patient Information 2015 [place], [place]. This information is not intended to replace advice given to you by your health care provider. Make sure you discuss any questions you have with your health care provider.

## 2014-12-08 NOTE — ED Notes (Signed)
Patient with no complaints at this time. Respirations even and unlabored. Skin warm/dry. Discharge instructions reviewed with patient at this time. Patient given opportunity to voice concerns/ask questions. Patient discharged at this time and left Emergency Department with steady gait.   

## 2014-12-17 ENCOUNTER — Emergency Department (HOSPITAL_COMMUNITY): Payer: Medicare Other

## 2014-12-17 ENCOUNTER — Encounter (HOSPITAL_COMMUNITY): Payer: Self-pay | Admitting: Cardiology

## 2014-12-17 ENCOUNTER — Emergency Department (HOSPITAL_COMMUNITY)
Admission: EM | Admit: 2014-12-17 | Discharge: 2014-12-17 | Disposition: A | Discharge status: Home / Self Care | Payer: Medicare Other | Attending: Emergency Medicine | Admitting: Emergency Medicine

## 2014-12-17 DIAGNOSIS — Z7952 Long term (current) use of systemic steroids: Secondary | ICD-10-CM | POA: Insufficient documentation

## 2014-12-17 DIAGNOSIS — S3992XA Unspecified injury of lower back, initial encounter: Secondary | ICD-10-CM | POA: Insufficient documentation

## 2014-12-17 DIAGNOSIS — G8929 Other chronic pain: Secondary | ICD-10-CM

## 2014-12-17 DIAGNOSIS — Y998 Other external cause status: Secondary | ICD-10-CM | POA: Insufficient documentation

## 2014-12-17 DIAGNOSIS — Y9289 Other specified places as the place of occurrence of the external cause: Secondary | ICD-10-CM | POA: Insufficient documentation

## 2014-12-17 DIAGNOSIS — Z79899 Other long term (current) drug therapy: Secondary | ICD-10-CM | POA: Insufficient documentation

## 2014-12-17 DIAGNOSIS — W1830XA Fall on same level, unspecified, initial encounter: Secondary | ICD-10-CM | POA: Insufficient documentation

## 2014-12-17 DIAGNOSIS — Z791 Long term (current) use of non-steroidal anti-inflammatories (NSAID): Secondary | ICD-10-CM | POA: Diagnosis not present

## 2014-12-17 DIAGNOSIS — Y9389 Activity, other specified: Secondary | ICD-10-CM | POA: Insufficient documentation

## 2014-12-17 DIAGNOSIS — Z880 Allergy status to penicillin: Secondary | ICD-10-CM | POA: Insufficient documentation

## 2014-12-17 DIAGNOSIS — I10 Essential (primary) hypertension: Secondary | ICD-10-CM | POA: Insufficient documentation

## 2014-12-17 DIAGNOSIS — Z792 Long term (current) use of antibiotics: Secondary | ICD-10-CM | POA: Insufficient documentation

## 2014-12-17 DIAGNOSIS — M549 Dorsalgia, unspecified: Secondary | ICD-10-CM

## 2014-12-17 HISTORY — DX: Dorsalgia, unspecified: M54.9

## 2014-12-17 HISTORY — DX: Other chronic pain: G89.29

## 2014-12-17 HISTORY — DX: Pleurodynia: R07.81

## 2014-12-17 HISTORY — DX: Cervicalgia: M54.2

## 2014-12-17 HISTORY — DX: Fibromyalgia: M79.7

## 2014-12-17 HISTORY — DX: Sciatica, unspecified side: M54.30

## 2014-12-17 HISTORY — DX: Pain in unspecified knee: M25.569

## 2014-12-17 HISTORY — DX: Other chest pain: R07.89

## 2014-12-17 HISTORY — DX: Radiculopathy, cervical region: M54.12

## 2014-12-17 MED ORDER — HYDROCODONE-ACETAMINOPHEN 5-325 MG PO TABS
1.0000 | ORAL_TABLET | Freq: Once | ORAL | Status: AC
  Administered 2014-12-17: 1 via ORAL
  Filled 2014-12-17: qty 1

## 2014-12-17 MED ORDER — NAPROXEN 250 MG PO TABS: 250.0000 mg | ORAL_TABLET | Freq: Two times a day (BID) | ORAL | Status: DC | PRN

## 2014-12-17 MED ORDER — METHOCARBAMOL 500 MG PO TABS: 1000.0000 mg | ORAL_TABLET | Freq: Four times a day (QID) | ORAL | Status: DC | PRN

## 2014-12-17 NOTE — ED Notes (Signed)
Pt vomited.  Dr Wilkie Aye in to see pt.  Plan to add more labs, given fluid and medicate for nausea.

## 2014-12-17 NOTE — Discharge Instructions (Signed)
°Emergency Department Resource Guide °1) Find a Doctor and Pay Out of Pocket °Although you won't have to find out who is covered by your insurance plan, it is a good idea to ask around and get recommendations. You will then need to call the office and see if the doctor you have chosen will accept you as a new patient and what types of options they offer for patients who are self-pay. Some doctors offer discounts or will set up payment plans for their patients who do not have insurance, but you will need to ask so you aren't surprised when you get to your appointment. ° °2) Contact Your Local Health Department °Not all health departments have doctors that can see patients for sick visits, but many do, so it is worth a call to see if yours does. If you don't know where your local health department is, you can check in your phone book. The CDC also has a tool to help you locate your state's health department, and many state websites also have listings of all of their local health departments. ° °3) Find a Walk-in Clinic °If your illness is not likely to be very severe or complicated, you may want to try a walk in clinic. These are popping up all over the country in pharmacies, drugstores, and shopping centers. They're usually staffed by nurse practitioners or physician assistants that have been trained to treat common illnesses and complaints. They're usually fairly quick and inexpensive. However, if you have serious medical issues or chronic medical problems, these are probably not your best option. ° °No Primary Care Doctor: °- Call [facility] at  832-8000 - they can help you locate a primary care doctor that  accepts your insurance, provides certain services, etc. °- Physician Referral Service- 1-800-533-3463 ° °Chronic Pain Problems: °Organization         Address  Phone   Notes  °[facility]  (336) 297-2271 Patients need to be referred by their primary care doctor.  ° °Medication  Assistance: °Organization         Address  Phone   Notes  °[place] [facility] [address] °[place], [place] [address] (336) 641-8030 --Must be a resident of [place] °-- Must have NO insurance coverage whatsoever (no Medicaid/ Medicare, etc.) °-- The pt. MUST have a primary care doctor that directs their care regularly and follows them in the community °  °[facility]  (866) 331-1348   °[employer]  (888) 892-1162   ° °Agencies that provide inexpensive medical care: °Organization         Address  Phone   Notes  °[facility]  (336) 832-8035   °[facility]    (336) 832-7272   °[facility] [address] °[place], [place] [address] (336) 832-4777   °[facility] [address], °[place] (336) 271-4999   °[facility]    (336) 373-0678   °[facility]    (336) 272-1050   °[facility] ° [address], [place] Phone:  (336) 832-4444, Fax:  (336) 832-4440 Hours of Operation:  9 am - 6 pm, M-F.  Also accepts Medicaid/Medicare and self-pay.  °[facility] ° [address], [place] Phone: (336) 832-3150, Fax: (336) 832-3151. Hours of Operation:  8:30 am - 5:30 pm, M-F.  Also accepts Medicaid and self-pay.  °[facility] [place] [address], [place] Phone: (336) 878-6027   °[facility] [address], [place], [place] (336)723-1848, Ext. 123 Mondays & Thursdays: 7-9 AM.  First 15 patients are seen on a first come, first serve basis. °  ° °Medicaid-accepting [place] Providers: ° °Organization         Address  Phone   Notes  °[facility] [address], [place] (336) 641-2100 Also accepts self-pay patients.  °[facility] [address], [place] ° (336) 856-9996   °[facility] [address], [place]  (336) 288-8857   °[facility] [address] [place] [address], [place] (336) 299-7000   °Veita Bland [address], [place]  ° (336) 373-1557 Only accepts [place] Access Medicaid patients after they have their name applied to their card.  ° °Self-Pay (no insurance) in [place]: ° °Organization         Address  Phone   Notes  °Sickle Cell Patients, [facility] [address], [place] (336) 832-1970   °[facility] [address], [place] (336) 832-4400   °[facility] ° [address], [place] (336) 992-4800   °[facility]/Dr. Osei-Bonsu ° [address] [place] [address], [place] or [address], [place] (336) 841-8500 Phone number for both [place] and [place] locations is the same.  °[facility] [address], [place] (336) 299-0000   °[facility] [address] [place] [address], [place] or [address] (336) 852-7530 °(336) 878-2260   °[facility] [address], [place] (336) 350-1642, phone; (336) 294-5005, fax Sees patients 1st and 3rd Saturday of every month.  Must not qualify for public or private insurance (i.e. Medicaid, Medicare, [place] Health Choice, Veterans' Benefits) • Household income should be no more than 200% of the poverty level •The clinic cannot treat you if you are pregnant or think you are pregnant • Sexually transmitted diseases are not treated at the clinic.  ° ° °Dental Care: °Organization         Address  Phone  Notes  °[place] [facility] [address],  (336) 641-6152 Accepts children up to age 21 who are enrolled in Medicaid or [place] Health Choice; pregnant women with a Medicaid card; and children who have applied for Medicaid or [place] Health Choice, but were declined, whose parents can pay a reduced fee at time of service.  °[place]  [facility] [place]  [address], [place] (336) 641-7733 Accepts children up to age 21 who are enrolled in Medicaid or [place] Health Choice; pregnant women with a Medicaid card; and children who have applied for Medicaid or  Health Choice, but were declined, whose parents can pay a reduced fee at time of service.  °[facility] ° [address],  (336) 641-4533 Patients are seen by appointment only. Walk-ins are not accepted. [facility] will see patients 18 years of age and older. °Monday - Tuesday (8am-5pm) °Most Wednesdays (8:30-5pm) °$30 per visit, cash only  °[facility] ° [address], [place] (336) 641-4533 Patients are seen by appointment only. Walk-ins are not accepted. [facility] will see patients 18 years of age and older. °One   Wednesday Evening (Monthly: Volunteer Based).  $30 per visit, cash only  °[facility]  (919) 537-3737 for adults; Children under age 4, call Graduate Pediatric Dentistry at (919) [phone]. Children aged 4-14, please call (919) [phone] to request a pediatric application. ° Dental services are provided in all areas of dental care including fillings, crowns and bridges, complete and partial dentures, implants, gum treatment, root canals, and extractions. Preventive care is also provided. Treatment is provided to both adults and children. °Patients are selected via a lottery and there is often a waiting list. °  °[facility] [address], °[place] ° (336) 763-8833 [email or web address] °  °[facility] [address], [place], [place] (336)723-1848, Ext. 123 Second and Fourth Thursday of each month, opens at 6:30 AM; Clinic ends at 9 AM.  Patients are seen on a first-come first-served basis, and a limited number are seen during each clinic.  ° °[facility] ° [address], [place], [place] (336) 723-7904    Eligibility Requirements °You must have lived in [place], [place], or [place] counties for at least the last three months. °  You cannot be eligible for state or federal sponsored healthcare insurance, including Veterans Administration, Medicaid, or Medicare. °  You generally cannot be eligible for healthcare insurance through your employer.  °  How to apply: °Eligibility screenings are held every Tuesday and Wednesday afternoon from 1:00 pm until 4:00 pm. You do not need an appointment for the interview!  °[facility] [address], [place], [place] 336-631-2330   °[facility] [facility]  336-342-8273   °[facility]  336-703-3100   °[facility]  336-570-6415   ° °Behavioral Health Resources in the Community: °Intensive Outpatient Programs °Organization         Address  Phone  Notes  °[facility] [address], [place], [place] 336-878-6098   °[facility] [address], [place], [place] 336-832-9800   °[facility] [address], [place], [place] ° 336-882-2125   °[facility] [address],  °[place], [place] 1-800-853-5163 or 336-641-4981   °Substance Abuse Resources °Organization         Address  Phone  Notes  °[facility]  336-882-2125   °[facility]  336-784-9470   °[facility]  336-285-9073   °[facility]  336-845-3988   °Residential & Outpatient Substance Abuse Program  1-800-659-3381   °Psychological Services °Organization         Address  Phone  Notes  °[facility]  336- 832-9600   °[facility]  336- 378-7881   °[facility] [address], [place] 1-800-853-5163 or 336-641-4981   ° °Mobile Crisis Teams °Organization         Address  Phone  Notes  °[facility], Mobile Crisis Care Unit  1-877-626-1772   °Assertive °[facility] ° [address]  [place], [place] 336-834-9664   °Sharon DeEsch [address] °[place] 336-554-5454   ° °Self-Help/Support Groups °Organization         Address  Phone             Notes  °[facility] [place] - variety of support groups  336- 373-1402 Call for more information  °Narcotics Anonymous (NA), [facility] [address], °[place]  2 meetings at this location  ° °  Residential Treatment Programs °Organization         Address  Phone  Notes  °[facility] [address],    °[place]  1-866-801-8205   °[facility] ° [address], [place], [place] 704-293-8524   °[facility] [address], [place] 336-845-3988 Admissions: 8am-3pm M-F  °[facility] [address]    °[place], [place] 336-841-1104   °[facility] [address], [place], [place] 336-379-7146   °[facility] [address]  °[place], [place] 336-285-9073   °[facility] - Intensive Outpatient [address], [place], [place] 336-852-3033   °[facility] [address]  °[place], [place] 1-877-615-2722 or 336-784-9470   °[facility] [address] [place], [place] 336-227-7417 Accepts Medicaid  °[facility] [address]  °[place] 1-800-659-3381 Substance Abuse/Addiction Treatment  ° °[place] Behavioral Health Resources °Organization         Address  Phone  Notes  °[facility]  (888) 581-9988   °Julie Brannon, PhD [address] [place], [place]   (336) 349-5553 or (336) 951-0000   °[facility]   [address] °[place], [place] (336) 349-4454   °[facility] [address], [place], [place] (336) 342-8316 Insurance/Medicaid/sponsorship through [facility]  °[facility] [address]                                    [place], [place] (336) 342-8316 Therapy/tele-psych/case    °[facility] [address]  ° [place], [place] (336) 349-2233    °Dr. Arfeen  (336) 349-4544   °[facility] [place]  [employer] [place] [facility] 1) [address], [place] °2) [address], [place] °3)  [address], [place] (336) 349-3220 °(336) 342-7768 ° °(336) 342-8140   °[place] Child Abuse [facility] (336) 342-1394 or (336) 342-3537 (After Hours)    ° ° °Take the prescriptions as directed.  Apply moist heat or ice to the area(s) of discomfort, for 15 minutes at a time, several times per day for the next few days.  Do not fall asleep on a heating or ice pack.  Call your regular medical doctor today to schedule a follow up appointment in the next 2 days.  Return to the Emergency Department immediately if worsening. ° °

## 2014-12-17 NOTE — ED Provider Notes (Signed)
CSN: [id number]     Arrival date & time 12/17/14  1325 History   First MD Initiated Contact with Patient 12/17/14 1344     Chief Complaint  Patient presents with  . Back Pain      HPI Pt was seen at 1350. Per pt, c/o gradual onset and persistence of constant acute flair of her chronic low back "pain" that began this morning. Pt states she stood up after urinating on the toilet, "felt lightheaded," and fell against the wall. Describes the pain as located in her right lower back, "throbbing," and radiating down her right leg.  Denies any change in her usual chronic pain pattern.  Pain worsens with palpation of the area and body position changes. Pt states she did not take any medications PTA "because I don't have any pain meds." Denies LOC/syncope, no AMS, no head injury, no neck pain, no incont/retention of bowel or bladder, no saddle anesthesia, no focal motor weakness, no tingling/numbness in extremities, no fevers, no direct injury, no abd pain, no N/V/D, no CP/palpitations, no SOB/cough.   The symptoms have been associated with no other complaints. The patient has a significant history of similar symptoms previously, recently being evaluated for this complaint and multiple prior evals for same.    Past Medical History  Diagnosis Date  . Hypertension   . Chronic back pain   . Sciatic pain     right  . Rib pain on right side     chronic  . Chronic neck pain   . Cervical radiculopathy   . Chronic knee pain   . Rheumatoid arthritis     "Rhematoid factor positive but no symptoms" per EPIC notes 2012  . Fibromyalgia    Past Surgical History  Procedure Laterality Date  . Tubal ligation    . Bladder surgery      History  Substance Use Topics  . Smoking status: Never Smoker   . Smokeless tobacco: Not on file  . Alcohol Use: No   OB History    Gravida Para Term Preterm AB TAB SAB Ectopic Multiple Living            3     Review of Systems ROS: Statement: All systems negative  except as marked or noted in the HPI; Constitutional: Negative for fever and chills. ; ; Eyes: Negative for eye pain, redness and discharge. ; ; ENMT: Negative for ear pain, hoarseness, nasal congestion, sinus pressure and sore throat. ; ; Cardiovascular: Negative for chest pain, palpitations, diaphoresis, dyspnea and peripheral edema. ; ; Respiratory: Negative for cough, wheezing and stridor. ; ; Gastrointestinal: Negative for nausea, vomiting, diarrhea, abdominal pain, blood in stool, hematemesis, jaundice and rectal bleeding. . ; ; Genitourinary: Negative for dysuria, flank pain and hematuria. ; ; Musculoskeletal: +LBP. Negative for neck pain. Negative for swelling and trauma.; ; Skin: Negative for pruritus, rash, abrasions, blisters, bruising and skin lesion.; ; Neuro: +lightheadedness. Negative for headache and neck stiffness. Negative for weakness, altered level of consciousness , altered mental status, extremity weakness, paresthesias, involuntary movement, seizure and syncope.     Allergies  Penicillins; Toradol; and Tramadol  Home Medications   Prior to Admission medications   Medication Sig Start Date End Date Taking? Authorizing Provider  busPIRone (BUSPAR) 7.5 MG tablet Take 7.5 mg by mouth daily.   Yes Historical Provider, MD  cyclobenzaprine (FLEXERIL) 10 MG tablet Take 1 tablet (10 mg total) by mouth 3 (three) times daily. 12/08/14  Yes Ivery Quale, PA-C  dexamethasone (DECADRON) 6 MG tablet 1 po bid with food Patient taking differently: Take 6 mg by mouth 2 (two) times daily. 1 po bid with food 12/08/14  Yes Ivery Quale, PA-C  HYDROcodone-acetaminophen (NORCO/VICODIN) 5-325 MG per tablet Take 1 tablet by mouth every 4 (four) hours as needed. 11/29/14  Yes Ivery Quale, PA-C  meloxicam (MOBIC) 7.5 MG tablet Take 7.5 mg by mouth 3 (three) times daily.   Yes Historical Provider, MD  naproxen (NAPROSYN) 500 MG tablet Take 1 tablet (500 mg total) by mouth 2 (two) times daily. 12/08/14  Yes  Ivery Quale, PA-C  venlafaxine XR (EFFEXOR-XR) 75 MG 24 hr capsule Take 75 mg by mouth 3 (three) times daily.   Yes Historical Provider, MD  clindamycin (CLEOCIN) 150 MG capsule Take 3 capsules (450 mg total) by mouth 3 (three) times daily. Patient not taking: Reported on 11/29/2014 07/14/14   Santiago Glad, PA-C  methocarbamol (ROBAXIN) 500 MG tablet Take 1 tablet (500 mg total) by mouth every 8 (eight) hours as needed for muscle spasms. Patient not taking: Reported on 11/29/2014 02/26/14   Marissa Sciacca, PA-C  metroNIDAZOLE (FLAGYL) 500 MG tablet Take 1 tablet (500 mg total) by mouth 2 (two) times daily. Please take with a meal. Patient not taking: Reported on 12/17/2014 11/29/14   Ivery Quale, PA-C  oxyCODONE-acetaminophen (PERCOCET/ROXICET) 5-325 MG per tablet Take 1 tablet by mouth every 4 (four) hours as needed. Patient not taking: Reported on 11/29/2014 09/01/14   Tammi Triplett, PA-C  predniSONE (DELTASONE) 10 MG tablet Take 6 tablets day one, 5 tablets day two, 4 tablets day three, 3 tablets day four, 2 tablets day five, then 1 tablet day six Patient not taking: Reported on 11/29/2014 09/01/14   Tammi Triplett, PA-C  sulfamethoxazole-trimethoprim (BACTRIM DS) 800-160 MG per tablet Take 1 tablet by mouth 2 (two) times daily. Patient not taking: Reported on 11/29/2014 09/12/13   Earley Favor, NP   BP 142/93 mmHg  Pulse 105  Temp(Src) 98 F (36.7 C) (Oral)  Resp 18  Ht 5' (1.524 m)  Wt 208 lb (94.348 kg)  BMI 40.62 kg/m2  SpO2 98%  LMP 12/07/2014   Orthostatic Lying  - BP- Lying: 128/82 mmHg ; Pulse- Lying: 91  Orthostatic Sitting - BP- Sitting: 130/88 mmHg ; Pulse- Sitting: 102  Orthostatic Standing at 0 minutes - BP- Standing at 0 minutes: 118/88 mmHg ; Pulse- Standing at 0 minutes: 109   Physical Exam  1355: Physical examination:  Nursing notes reviewed; Vital signs and O2 SAT reviewed;  Constitutional: Well developed, Well nourished, Well hydrated, In no acute distress; Head:   Normocephalic, atraumatic; Eyes: EOMI, PERRL, No scleral icterus; ENMT: Mouth and pharynx normal, Mucous membranes moist; Neck: Supple, Full range of motion, No lymphadenopathy; Cardiovascular: Regular rate and rhythm, No murmur, rub, or gallop; Respiratory: Breath sounds clear & equal bilaterally, No rales, rhonchi, wheezes.  Speaking full sentences with ease, Normal respiratory effort/excursion; Chest: Nontender, Movement normal; Abdomen: Soft, Nontender, Nondistended, Normal bowel sounds; Genitourinary: No CVA tenderness; Spine:  No midline CS, TS, LS tenderness. +TTP right lumbar paraspinal muscles.;; Extremities: Pulses normal, No tenderness, No edema, No calf edema or asymmetry.; Neuro: AA&Ox3, Major CN grossly intact.  Speech clear. Strength 5/5 equal bilat UE's and LE's, including great toe dorsiflexion.  DTR 2/4 equal bilat UE's and LE's.  No gross sensory deficits.  Neg straight leg raises bilat..; Skin: Color normal, Warm, Dry.   ED Course  Procedures    1405:  [place] Controlled  Substance Database accessed: in the first 3 months of this year, pt has received 4 narcotic prescriptions, written by 2 different providers and filled at 2 different pharmacies; most recent rx was filled 12/04/2014 for tamadol 50mg  tabs, #272, rx by Dr. .  Concerned regarding this information, as pt told me during her HPI that she did not have any pain meds at home. EPIC chart reviewed: ofc note from Physiatrist Dr. Windle Guard on 01/20/2011 "Given multiple providers in the past, would shy away from narcotic analgesics plus she really does not have any clear indication for these anyway." This office visit notes that pt had normal MRI LS, as well as normal EMG/NCV bilateral UE's.   1345:  Workup reassuring. Pt not orthostatic. Appears comfortable on stretcher playing with handheld electronic device. Aware she will not be receiving rx for narcotic pain medications, reasoning above; verb understanding. States she is  ready to go home now. Dx and testing d/w pt and family.  Questions answered.  Verb understanding, agreeable to d/c home with outpt f/u.       EKG Interpretation   Date/Time:  Monday December 17 2014 13:38:04 EDT Ventricular Rate:  96 PR Interval:  153 QRS Duration: 89 QT Interval:  380 QTC Calculation: 480 R Axis:   8 Text Interpretation:  Sinus rhythm Low voltage, precordial leads Artifact  No old tracing to compare Confirmed by [facility]  MD, [facility] [phone]) on  12/17/2014 2:42:53 PM      MDM  MDM Reviewed: previous chart, nursing note and vitals Reviewed previous: MRI Interpretation: x-ray    Dg Lumbar Spine Complete 12/17/2014   CLINICAL DATA:  Low back and right leg pain. Status post fall today. Initial encounter.  EXAM: LUMBAR SPINE - COMPLETE 4+ VIEW  COMPARISON:  Plain films lumbar spine 02/26/2014.  FINDINGS: There is no fracture or malalignment of the lumbar spine. Intervertebral disc space height is maintained. No pars interarticularis defect is identified. Large volume of stool in the visualized colon is noted.  IMPRESSION: Normal appearing lumbar spine.  Large stool burden in the colon.   Electronically Signed   By: 02/28/2014 M.D.   On: 12/17/2014 14:55      02/16/2015, DO 12/20/14 1634

## 2014-12-17 NOTE — ED Notes (Signed)
States she was in the bathroom this morning and went to stand and got dizzy.  Fell back and hit the wall. C/o lower back and right leg pain.

## 2015-01-08 ENCOUNTER — Encounter (HOSPITAL_COMMUNITY): Payer: Self-pay

## 2015-01-08 ENCOUNTER — Emergency Department (HOSPITAL_COMMUNITY): Payer: Medicare Other

## 2015-01-08 ENCOUNTER — Emergency Department (HOSPITAL_COMMUNITY)
Admission: EM | Admit: 2015-01-08 | Discharge: 2015-01-08 | Disposition: A | Discharge status: Home / Self Care | Payer: Medicare Other | Attending: Emergency Medicine | Admitting: Emergency Medicine

## 2015-01-08 DIAGNOSIS — I10 Essential (primary) hypertension: Secondary | ICD-10-CM | POA: Diagnosis not present

## 2015-01-08 DIAGNOSIS — M797 Fibromyalgia: Secondary | ICD-10-CM | POA: Insufficient documentation

## 2015-01-08 DIAGNOSIS — Z880 Allergy status to penicillin: Secondary | ICD-10-CM | POA: Diagnosis not present

## 2015-01-08 DIAGNOSIS — G8929 Other chronic pain: Secondary | ICD-10-CM | POA: Diagnosis not present

## 2015-01-08 DIAGNOSIS — E119 Type 2 diabetes mellitus without complications: Secondary | ICD-10-CM | POA: Insufficient documentation

## 2015-01-08 DIAGNOSIS — Z791 Long term (current) use of non-steroidal anti-inflammatories (NSAID): Secondary | ICD-10-CM | POA: Diagnosis not present

## 2015-01-08 DIAGNOSIS — M7122 Synovial cyst of popliteal space [Baker], left knee: Secondary | ICD-10-CM | POA: Insufficient documentation

## 2015-01-08 DIAGNOSIS — M069 Rheumatoid arthritis, unspecified: Secondary | ICD-10-CM | POA: Diagnosis not present

## 2015-01-08 DIAGNOSIS — Z79899 Other long term (current) drug therapy: Secondary | ICD-10-CM | POA: Insufficient documentation

## 2015-01-08 DIAGNOSIS — M79605 Pain in left leg: Secondary | ICD-10-CM | POA: Diagnosis present

## 2015-01-08 HISTORY — DX: Type 2 diabetes mellitus without complications: E11.9

## 2015-01-08 MED ORDER — DICLOFENAC SODIUM 50 MG PO TBEC: 50.0000 mg | DELAYED_RELEASE_TABLET | Freq: Two times a day (BID) | ORAL | Status: DC

## 2015-01-08 MED ORDER — HYDROCODONE-ACETAMINOPHEN 5-325 MG PO TABS
1.0000 | ORAL_TABLET | Freq: Once | ORAL | Status: AC
  Administered 2015-01-08: 1 via ORAL
  Filled 2015-01-08: qty 1

## 2015-01-08 NOTE — ED Notes (Signed)
Pt states that she took 500mg  of tylenol early this morning without any relief and is requesting something for pain, will notify hope , NP

## 2015-01-08 NOTE — Discharge Instructions (Signed)
Your ultrasound of your left leg today show no blood clot. It does show a Baker's cyst that does not appear to be infected. This can cause inflammation and pain. Take the medication as directed and follow up with your doctor.

## 2015-01-08 NOTE — ED Provider Notes (Signed)
CSN: [id number]     Arrival date & time 01/08/15  1136 History   First MD Initiated Contact with Patient 01/08/15 1218     Chief Complaint  Patient presents with  . Leg Pain     (Consider location/radiation/quality/duration/timing/severity/associated sxs/prior Treatment) Patient is a 44 y.o. female presenting with leg pain.  Leg Pain Location:  Leg Time since incident:  4 days Injury: no   Leg location:  L upper leg and L lower leg Pain details:    Quality:  Tingling, aching and throbbing   Radiates to:  Does not radiate   Severity:  Moderate   Onset quality:  Gradual   Timing:  Intermittent   Progression:  Worsening Chronicity:  New Dislocation: no   Foreign body present:  No foreign bodies Prior injury to area:  No Worsened by:  Activity and bearing weight Ineffective treatments:  None tried Associated symptoms: swelling   Associated symptoms: no decreased ROM    Regina Patton is a 44 y.o. female who presents to the ED with left leg pain. The pain started 4 days ago. She reports that her toes feel tingly at times. The pain starts in the posterior aspect of the left upper leg and goes to the calf and to the foot. The pain comes and goes. She feels that her left calf is swollen.    Past Medical History  Diagnosis Date  . Hypertension   . Chronic back pain   . Sciatic pain     right  . Rib pain on right side     chronic  . Chronic neck pain   . Cervical radiculopathy   . Chronic knee pain   . Rheumatoid arthritis     "Rhematoid factor positive but no symptoms" per EPIC notes 2012  . Fibromyalgia   . Diabetes mellitus without complication    Past Surgical History  Procedure Laterality Date  . Tubal ligation    . Bladder surgery     No family history on file. History  Substance Use Topics  . Smoking status: Never Smoker   . Smokeless tobacco: Not on file  . Alcohol Use: No   OB History    Gravida Para Term Preterm AB TAB SAB Ectopic Multiple Living             3     Review of Systems Negative except as stated in HPI  Allergies  Penicillins; Toradol; and Tramadol  Home Medications   Prior to Admission medications   Medication Sig Start Date End Date Taking? Authorizing Provider  venlafaxine XR (EFFEXOR-XR) 75 MG 24 hr capsule Take 75 mg by mouth 3 (three) times daily.   Yes Historical Provider, MD  clindamycin (CLEOCIN) 150 MG capsule Take 3 capsules (450 mg total) by mouth 3 (three) times daily. Patient not taking: Reported on 11/29/2014 07/14/14   Santiago Glad, PA-C  cyclobenzaprine (FLEXERIL) 10 MG tablet Take 1 tablet (10 mg total) by mouth 3 (three) times daily. Patient not taking: Reported on 01/08/2015 12/08/14   Ivery Quale, PA-C  dexamethasone (DECADRON) 6 MG tablet 1 po bid with food Patient not taking: Reported on 01/08/2015 12/08/14   Ivery Quale, PA-C  diclofenac (VOLTAREN) 50 MG EC tablet Take 1 tablet (50 mg total) by mouth 2 (two) times daily. 01/08/15   Branko Steeves Orlene Och, NP  methocarbamol (ROBAXIN) 500 MG tablet Take 2 tablets (1,000 mg total) by mouth 4 (four) times daily as needed for muscle spasms (muscle spasm/pain). Patient  not taking: Reported on 01/08/2015 12/17/14   Samuel Jester, DO  metroNIDAZOLE (FLAGYL) 500 MG tablet Take 1 tablet (500 mg total) by mouth 2 (two) times daily. Please take with a meal. Patient not taking: Reported on 12/17/2014 11/29/14   Ivery Quale, PA-C  predniSONE (DELTASONE) 10 MG tablet Take 6 tablets day one, 5 tablets day two, 4 tablets day three, 3 tablets day four, 2 tablets day five, then 1 tablet day six Patient not taking: Reported on 11/29/2014 09/01/14   Tammy Triplett, PA-C  sulfamethoxazole-trimethoprim (BACTRIM DS) 800-160 MG per tablet Take 1 tablet by mouth 2 (two) times daily. Patient not taking: Reported on 11/29/2014 09/12/13   Earley Favor, NP   BP 149/88 mmHg  Pulse 93  Temp(Src) 98.9 F (37.2 C) (Oral)  Resp 16  Ht 5' (1.524 m)  Wt 197 lb (89.359 kg)  BMI 38.47 kg/m2  SpO2 98%   LMP 01/01/2015 Physical Exam  Constitutional: She is oriented to person, place, and time. She appears well-developed and well-nourished.  HENT:  Head: Normocephalic.  Eyes: EOM are normal.  Neck: Neck supple.  Cardiovascular: Normal rate.   Pulmonary/Chest: Effort normal.  Abdominal: Soft. There is no tenderness.  Musculoskeletal: Normal range of motion.       Left lower leg: She exhibits no tenderness, no deformity and no laceration. Swelling: minimal.  Pedal pulses 2+ bilateral, adequate circulation, good touch sensation. Mild swelling to the posterior aspect of the left knee.   Neurological: She is alert and oriented to person, place, and time. No cranial nerve deficit.  Skin: Skin is warm and dry.  Psychiatric: She has a normal mood and affect. Her behavior is normal.  Nursing note and vitals reviewed.   ED Course  Procedures (including critical care time) Labs Review Labs Reviewed - No data to display  Imaging Review US Venous Img Lower Unilateral Left  01/08/2015   CLINICAL DATA:  Left lower extremity swelling, pain.  EXAM: LEFT LOWER EXTREMITY VENOUS DOPPLER ULTRASOUND  TECHNIQUE: Gray-scale sonography with graded compression, as well as color Doppler and duplex ultrasound were performed to evaluate the lower extremity deep venous systems from the level of the common femoral vein and including the common femoral, femoral, profunda femoral, popliteal and calf veins including the posterior tibial, peroneal and gastrocnemius veins when visible. The superficial great saphenous vein was also interrogated. Spectral Doppler was utilized to evaluate flow at rest and with distal augmentation maneuvers in the common femoral, femoral and popliteal veins.  COMPARISON:  None.  FINDINGS: Contralateral Common Femoral Vein: Respiratory phasicity is normal and symmetric with the symptomatic side. No evidence of thrombus. Normal compressibility.  Common Femoral Vein: No evidence of thrombus. Normal  compressibility, respiratory phasicity and response to augmentation.  Saphenofemoral Junction: No evidence of thrombus. Normal compressibility and flow on color Doppler imaging.  Profunda Femoral Vein: No evidence of thrombus. Normal compressibility and flow on color Doppler imaging.  Femoral Vein: No evidence of thrombus. Normal compressibility, respiratory phasicity and response to augmentation.  Popliteal Vein: No evidence of thrombus. Normal compressibility, respiratory phasicity and response to augmentation.  Calf Veins: No evidence of thrombus. Normal compressibility and flow on color Doppler imaging.  Superficial Great Saphenous Vein: No evidence of thrombus. Normal compressibility and flow on color Doppler imaging.  Venous Reflux:  None.  Other Findings: Complex popliteal fossa fluid collection measuring 7.0 x 4.2 x 2.1 cm compatible with complex Baker's cyst.  IMPRESSION: No evidence of deep venous thrombosis.  Baker's cyst.  Electronically Signed   By: Charlett Nose M.D.   On: 01/08/2015 14:37     MDM   Final diagnoses:  Baker's cyst of knee, left      [facility], NP 01/08/15 1541  Donnetta Hutching, MD 01/09/15 1250

## 2015-01-08 NOTE — ED Notes (Signed)
Pt c/o pain and swelling to left calf since Saturday.  Denies injury.  Reports toes feel tingly intermittently.  Pedal pulse present, cap refill wnl.

## 2015-01-08 NOTE — ED Notes (Signed)
Damian Leavell, NP made aware of pt's request for pain meds

## 2015-01-20 ENCOUNTER — Emergency Department (HOSPITAL_COMMUNITY)
Admission: EM | Admit: 2015-01-20 | Discharge: 2015-01-20 | Disposition: A | Discharge status: Home / Self Care | Payer: Medicare Other | Attending: Emergency Medicine | Admitting: Emergency Medicine

## 2015-01-20 ENCOUNTER — Emergency Department (HOSPITAL_COMMUNITY): Payer: Medicare Other

## 2015-01-20 ENCOUNTER — Encounter (HOSPITAL_COMMUNITY): Payer: Self-pay | Admitting: Emergency Medicine

## 2015-01-20 DIAGNOSIS — G8929 Other chronic pain: Secondary | ICD-10-CM | POA: Diagnosis not present

## 2015-01-20 DIAGNOSIS — I10 Essential (primary) hypertension: Secondary | ICD-10-CM | POA: Diagnosis not present

## 2015-01-20 DIAGNOSIS — K921 Melena: Secondary | ICD-10-CM | POA: Diagnosis not present

## 2015-01-20 DIAGNOSIS — Z792 Long term (current) use of antibiotics: Secondary | ICD-10-CM | POA: Diagnosis not present

## 2015-01-20 DIAGNOSIS — M797 Fibromyalgia: Secondary | ICD-10-CM | POA: Diagnosis not present

## 2015-01-20 DIAGNOSIS — R197 Diarrhea, unspecified: Secondary | ICD-10-CM | POA: Diagnosis not present

## 2015-01-20 DIAGNOSIS — Z880 Allergy status to penicillin: Secondary | ICD-10-CM | POA: Insufficient documentation

## 2015-01-20 DIAGNOSIS — Z3202 Encounter for pregnancy test, result negative: Secondary | ICD-10-CM | POA: Insufficient documentation

## 2015-01-20 DIAGNOSIS — R1031 Right lower quadrant pain: Secondary | ICD-10-CM

## 2015-01-20 DIAGNOSIS — R109 Unspecified abdominal pain: Secondary | ICD-10-CM | POA: Diagnosis present

## 2015-01-20 DIAGNOSIS — M5431 Sciatica, right side: Secondary | ICD-10-CM | POA: Diagnosis not present

## 2015-01-20 DIAGNOSIS — M069 Rheumatoid arthritis, unspecified: Secondary | ICD-10-CM | POA: Diagnosis not present

## 2015-01-20 DIAGNOSIS — N390 Urinary tract infection, site not specified: Secondary | ICD-10-CM | POA: Diagnosis not present

## 2015-01-20 DIAGNOSIS — E119 Type 2 diabetes mellitus without complications: Secondary | ICD-10-CM | POA: Insufficient documentation

## 2015-01-20 DIAGNOSIS — Z79899 Other long term (current) drug therapy: Secondary | ICD-10-CM | POA: Diagnosis not present

## 2015-01-20 DIAGNOSIS — Z791 Long term (current) use of non-steroidal anti-inflammatories (NSAID): Secondary | ICD-10-CM | POA: Insufficient documentation

## 2015-01-20 LAB — URINE MICROSCOPIC-ADD ON

## 2015-01-20 LAB — CBC WITH DIFFERENTIAL/PLATELET
Basophils Absolute: 0 10*3/uL (ref 0.0–0.1)
Basophils Relative: 0 % (ref 0–1)
EOS PCT: 1 % (ref 0–5)
Eosinophils Absolute: 0.1 10*3/uL (ref 0.0–0.7)
HCT: 44.6 % (ref 36.0–46.0)
HEMOGLOBIN: 15.6 g/dL — AB (ref 12.0–15.0)
LYMPHS ABS: 2.6 10*3/uL (ref 0.7–4.0)
Lymphocytes Relative: 28 % (ref 12–46)
MCH: 30.9 pg (ref 26.0–34.0)
MCHC: 35 g/dL (ref 30.0–36.0)
MCV: 88.3 fL (ref 78.0–100.0)
MONO ABS: 0.5 10*3/uL (ref 0.1–1.0)
Monocytes Relative: 5 % (ref 3–12)
NEUTROS ABS: 6.1 10*3/uL (ref 1.7–7.7)
Neutrophils Relative %: 66 % (ref 43–77)
PLATELETS: 328 10*3/uL (ref 150–400)
RBC: 5.05 MIL/uL (ref 3.87–5.11)
RDW: 13.3 % (ref 11.5–15.5)
WBC: 9.3 10*3/uL (ref 4.0–10.5)

## 2015-01-20 LAB — URINALYSIS, ROUTINE W REFLEX MICROSCOPIC
Bilirubin Urine: NEGATIVE
Glucose, UA: NEGATIVE mg/dL
KETONES UR: NEGATIVE mg/dL
NITRITE: NEGATIVE
Protein, ur: NEGATIVE mg/dL
Urobilinogen, UA: 0.2 mg/dL (ref 0.0–1.0)
pH: 6 (ref 5.0–8.0)

## 2015-01-20 LAB — COMPREHENSIVE METABOLIC PANEL
ALBUMIN: 4.4 g/dL (ref 3.5–5.0)
ALT: 17 U/L (ref 14–54)
AST: 21 U/L (ref 15–41)
Alkaline Phosphatase: 34 U/L — ABNORMAL LOW (ref 38–126)
Anion gap: 10 (ref 5–15)
BUN: 6 mg/dL (ref 6–20)
CO2: 24 mmol/L (ref 22–32)
Calcium: 9.6 mg/dL (ref 8.9–10.3)
Chloride: 104 mmol/L (ref 101–111)
Creatinine, Ser: 0.8 mg/dL (ref 0.44–1.00)
GFR calc non Af Amer: >60 mL/min (ref >60)
Glucose, Bld: 116 mg/dL — ABNORMAL HIGH (ref 65–99)
POTASSIUM: 4.1 mmol/L (ref 3.5–5.1)
Sodium: 138 mmol/L (ref 135–145)
Total Bilirubin: 0.8 mg/dL (ref 0.3–1.2)
Total Protein: 8.4 g/dL — ABNORMAL HIGH (ref 6.5–8.1)

## 2015-01-20 LAB — WET PREP, GENITAL
Trich, Wet Prep: NONE SEEN
Yeast Wet Prep HPF POC: NONE SEEN

## 2015-01-20 LAB — LIPASE, BLOOD: LIPASE: 51 U/L (ref 22–51)

## 2015-01-20 LAB — PREGNANCY, URINE: PREG TEST UR: NEGATIVE

## 2015-01-20 LAB — POC OCCULT BLOOD, ED: Fecal Occult Bld: NEGATIVE

## 2015-01-20 MED ORDER — MORPHINE SULFATE 2 MG/ML IJ SOLN
4.0000 mg | Freq: Once | INTRAMUSCULAR | Status: AC
  Administered 2015-01-20: 4 mg via INTRAVENOUS
  Filled 2015-01-20: qty 2

## 2015-01-20 MED ORDER — MORPHINE SULFATE 2 MG/ML IJ SOLN
2.0000 mg | Freq: Once | INTRAMUSCULAR | Status: AC
  Administered 2015-01-20: 2 mg via INTRAVENOUS
  Filled 2015-01-20: qty 1

## 2015-01-20 MED ORDER — ONDANSETRON HCL 4 MG/2ML IJ SOLN
4.0000 mg | Freq: Once | INTRAMUSCULAR | Status: DC
  Filled 2015-01-20: qty 2

## 2015-01-20 MED ORDER — ONDANSETRON HCL 4 MG/2ML IJ SOLN
4.0000 mg | Freq: Once | INTRAMUSCULAR | Status: AC
  Administered 2015-01-20: 4 mg via INTRAMUSCULAR
  Filled 2015-01-20: qty 2

## 2015-01-20 MED ORDER — IOHEXOL 300 MG/ML  SOLN
100.0000 mL | Freq: Once | INTRAMUSCULAR | Status: AC | PRN
  Administered 2015-01-20: 100 mL via INTRAVENOUS

## 2015-01-20 MED ORDER — SODIUM CHLORIDE 0.9 % IV SOLN
Freq: Once | INTRAVENOUS | Status: AC
  Administered 2015-01-20: 12:00:00 via INTRAVENOUS

## 2015-01-20 MED ORDER — IOHEXOL 300 MG/ML  SOLN
25.0000 mL | Freq: Once | INTRAMUSCULAR | Status: AC | PRN
  Administered 2015-01-20: 25 mL via ORAL

## 2015-01-20 MED ORDER — ONDANSETRON HCL 4 MG/2ML IJ SOLN
4.0000 mg | Freq: Once | INTRAMUSCULAR | Status: AC
  Administered 2015-01-20: 4 mg via INTRAVENOUS

## 2015-01-20 MED ORDER — HYDROCODONE-ACETAMINOPHEN 5-325 MG PO TABS: ORAL_TABLET | ORAL | Status: DC

## 2015-01-20 MED ORDER — CIPROFLOXACIN HCL 500 MG PO TABS: 500.0000 mg | ORAL_TABLET | Freq: Two times a day (BID) | ORAL | Status: DC

## 2015-01-20 NOTE — Discharge Instructions (Signed)
Urinary Tract Infection A urinary tract infection (UTI) can occur any place along the urinary tract. The tract includes the kidneys, ureters, bladder, and urethra. A type of germ called bacteria often causes a UTI. UTIs are often helped with antibiotic medicine.  HOME CARE   If given, take antibiotics as told by your doctor. Finish them even if you start to feel better.  Drink enough fluids to keep your pee (urine) clear or pale yellow.  Avoid tea, drinks with caffeine, and bubbly (carbonated) drinks.  Pee often. Avoid holding your pee in for a long time.  Pee before and after having sex (intercourse).  Wipe from front to back after you poop (bowel movement) if you are a woman. Use each tissue only once. GET HELP RIGHT AWAY IF:   You have back pain.  You have lower belly (abdominal) pain.  You have chills.  You feel sick to your stomach (nauseous).  You throw up (vomit).  Your burning or discomfort with peeing does not go away.  You have a fever.  Your symptoms are not better in 3 days. MAKE SURE YOU:   Understand these instructions.  Will watch your condition.  Will get help right away if you are not doing well or get worse. Document Released: 02/10/2008 Document Revised: 05/18/2012 Document Reviewed: 03/24/2012 [facility] Patient Information 2015 [place], [place]. This information is not intended to replace advice given to you by your health care provider. Make sure you discuss any questions you have with your health care provider.  Abdominal Pain, Women Abdominal (stomach, pelvic, or belly) pain can be caused by many things. It is important to tell your doctor:  The location of the pain.  Does it come and go or is it present all the time?  Are there things that start the pain (eating certain foods, exercise)?  Are there other symptoms associated with the pain (fever, nausea, vomiting, diarrhea)? All of this is helpful to know when trying to find the cause of the  pain. CAUSES   Stomach: virus or bacteria infection, or ulcer.  Intestine: appendicitis (inflamed appendix), regional ileitis (Crohn's disease), ulcerative colitis (inflamed colon), irritable bowel syndrome, diverticulitis (inflamed diverticulum of the colon), or cancer of the stomach or intestine.  Gallbladder disease or stones in the gallbladder.  Kidney disease, kidney stones, or infection.  Pancreas infection or cancer.  Fibromyalgia (pain disorder).  Diseases of the female organs:  Uterus: fibroid (non-cancerous) tumors or infection.  Fallopian tubes: infection or tubal pregnancy.  Ovary: cysts or tumors.  Pelvic adhesions (scar tissue).  Endometriosis (uterus lining tissue growing in the pelvis and on the pelvic organs).  Pelvic congestion syndrome (female organs filling up with blood just before the menstrual period).  Pain with the menstrual period.  Pain with ovulation (producing an egg).  Pain with an IUD (intrauterine device, birth control) in the uterus.  Cancer of the female organs.  Functional pain (pain not caused by a disease, may improve without treatment).  Psychological pain.  Depression. DIAGNOSIS  Your doctor will decide the seriousness of your pain by doing an examination.  Blood tests.  X-rays.  Ultrasound.  CT scan (computed tomography, special type of X-ray).  MRI (magnetic resonance imaging).  Cultures, for infection.  Barium enema (dye inserted in the large intestine, to better view it with X-rays).  Colonoscopy (looking in intestine with a lighted tube).  Laparoscopy (minor surgery, looking in abdomen with a lighted tube).  Major abdominal exploratory surgery (looking in abdomen with  a large incision). TREATMENT  The treatment will depend on the cause of the pain.   Many cases can be observed and treated at home.  Over-the-counter medicines recommended by your caregiver.  Prescription medicine.  Antibiotics, for  infection.  Birth control pills, for painful periods or for ovulation pain.  Hormone treatment, for endometriosis.  Nerve blocking injections.  Physical therapy.  Antidepressants.  Counseling with a psychologist or psychiatrist.  Minor or major surgery. HOME CARE INSTRUCTIONS   Do not take laxatives, unless directed by your caregiver.  Take over-the-counter pain medicine only if ordered by your caregiver. Do not take aspirin because it can cause an upset stomach or bleeding.  Try a clear liquid diet (broth or water) as ordered by your caregiver. Slowly move to a bland diet, as tolerated, if the pain is related to the stomach or intestine.  Have a thermometer and take your temperature several times a day, and record it.  Bed rest and sleep, if it helps the pain.  Avoid sexual intercourse, if it causes pain.  Avoid stressful situations.  Keep your follow-up appointments and tests, as your caregiver orders.  If the pain does not go away with medicine or surgery, you may try:  Acupuncture.  Relaxation exercises (yoga, meditation).  Group therapy.  Counseling. SEEK MEDICAL CARE IF:   You notice certain foods cause stomach pain.  Your home care treatment is not helping your pain.  You need stronger pain medicine.  You want your IUD removed.  You feel faint or lightheaded.  You develop nausea and vomiting.  You develop a rash.  You are having side effects or an allergy to your medicine. SEEK IMMEDIATE MEDICAL CARE IF:   Your pain does not go away or gets worse.  You have a fever.  Your pain is felt only in portions of the abdomen. The right side could possibly be appendicitis. The left lower portion of the abdomen could be colitis or diverticulitis.  You are passing blood in your stools (bright red or black tarry stools, with or without vomiting).  You have blood in your urine.  You develop chills, with or without a fever.  You pass out. MAKE SURE  YOU:   Understand these instructions.  Will watch your condition.  Will get help right away if you are not doing well or get worse. Document Released: 06/21/2007 Document Revised: 01/08/2014 Document Reviewed: 07/11/2009 [facility] Patient Information 2015 [place], [place]. This information is not intended to replace advice given to you by your health care provider. Make sure you discuss any questions you have with your health care provider.

## 2015-01-20 NOTE — ED Notes (Signed)
PT c/o RLQ abdominal pain x2 days with N/V. PT denies any urinary symptoms or fever.

## 2015-01-20 NOTE — ED Provider Notes (Signed)
CSN: [id number]     Arrival date & time 01/20/15  1034 History   First MD Initiated Contact with Patient 01/20/15 1120     Chief Complaint  Patient presents with  . Abdominal Pain     (Consider location/radiation/quality/duration/timing/severity/associated sxs/prior Treatment) HPI   Regina Patton is a 44 y.o. female who presents to the Emergency Department complaining of right abdominal pain for two days.  She states pain has worsened since onset.  Describes the pain as aching.  Reports pain improved yesterday then returned today with 2-3 episodes of vomiting and multiple episodes of bloody diarrhea.  Describes seeing "bright red" blood in the toilet and on tissue after wiping.  Nothing makes the pain better or worse.  She denies ASA use, fever, pelvic pain, back pain, chest pain or shortness of breath.     Past Medical History  Diagnosis Date  . Hypertension   . Chronic back pain   . Sciatic pain     right  . Rib pain on right side     chronic  . Chronic neck pain   . Cervical radiculopathy   . Chronic knee pain   . Rheumatoid arthritis     "Rhematoid factor positive but no symptoms" per EPIC notes 2012  . Fibromyalgia   . Diabetes mellitus without complication    Past Surgical History  Procedure Laterality Date  . Tubal ligation    . Bladder surgery     History reviewed. No pertinent family history. History  Substance Use Topics  . Smoking status: Never Smoker   . Smokeless tobacco: Not on file  . Alcohol Use: No   OB History    Gravida Para Term Preterm AB TAB SAB Ectopic Multiple Living            3     Review of Systems  Constitutional: Negative for fever, chills and appetite change.  Respiratory: Negative for chest tightness and shortness of breath.   Cardiovascular: Negative for chest pain.  Gastrointestinal: Positive for nausea, vomiting, abdominal pain, diarrhea and blood in stool. Negative for abdominal distention.  Genitourinary: Negative for  dysuria, hematuria, flank pain, decreased urine volume, vaginal bleeding, vaginal discharge, difficulty urinating and pelvic pain.  Musculoskeletal: Negative for back pain and neck pain.  Skin: Negative for color change and rash.  Neurological: Negative for dizziness, weakness and numbness.  Hematological: Negative for adenopathy.  All other systems reviewed and are negative.     Allergies  Penicillins; Toradol; and Tramadol  Home Medications   Prior to Admission medications   Medication Sig Start Date End Date Taking? Authorizing Provider  acetaminophen (TYLENOL) 500 MG tablet Take 1,000 mg by mouth every 6 (six) hours as needed.   Yes Historical Provider, MD  lisinopril-hydrochlorothiazide (PRINZIDE,ZESTORETIC) 20-25 MG per tablet Take 2 tablets by mouth daily.   Yes Historical Provider, MD  venlafaxine XR (EFFEXOR-XR) 75 MG 24 hr capsule Take 75 mg by mouth 3 (three) times daily.   Yes Historical Provider, MD  clindamycin (CLEOCIN) 150 MG capsule Take 3 capsules (450 mg total) by mouth 3 (three) times daily. Patient not taking: Reported on 11/29/2014 07/14/14   Santiago Glad, PA-C  cyclobenzaprine (FLEXERIL) 10 MG tablet Take 1 tablet (10 mg total) by mouth 3 (three) times daily. Patient not taking: Reported on 01/08/2015 12/08/14   Ivery Quale, PA-C  dexamethasone (DECADRON) 6 MG tablet 1 po bid with food Patient not taking: Reported on 01/08/2015 12/08/14   Ivery Quale, PA-C  diclofenac (VOLTAREN) 50 MG EC tablet Take 1 tablet (50 mg total) by mouth 2 (two) times daily. Patient not taking: Reported on 01/20/2015 01/08/15   Janne Napoleon, NP  methocarbamol (ROBAXIN) 500 MG tablet Take 2 tablets (1,000 mg total) by mouth 4 (four) times daily as needed for muscle spasms (muscle spasm/pain). Patient not taking: Reported on 01/08/2015 12/17/14   Samuel Jester, DO  metroNIDAZOLE (FLAGYL) 500 MG tablet Take 1 tablet (500 mg total) by mouth 2 (two) times daily. Please take with a meal. Patient  not taking: Reported on 12/17/2014 11/29/14   Ivery Quale, PA-C  predniSONE (DELTASONE) 10 MG tablet Take 6 tablets day one, 5 tablets day two, 4 tablets day three, 3 tablets day four, 2 tablets day five, then 1 tablet day six Patient not taking: Reported on 11/29/2014 09/01/14   Joselinne Lawal, PA-C  sulfamethoxazole-trimethoprim (BACTRIM DS) 800-160 MG per tablet Take 1 tablet by mouth 2 (two) times daily. Patient not taking: Reported on 11/29/2014 09/12/13   Earley Favor, NP   BP 140/103 mmHg  Pulse 110  Temp(Src) 98.9 F (37.2 C) (Oral)  Resp 18  Ht 5' (1.524 m)  Wt 197 lb (89.359 kg)  BMI 38.47 kg/m2  SpO2 96%  LMP 01/06/2015 Physical Exam  Constitutional: She appears well-developed and well-nourished. No distress.  Cardiovascular: Normal rate, regular rhythm, normal heart sounds and intact distal pulses.   No murmur heard. Pulmonary/Chest: Effort normal and breath sounds normal. No respiratory distress.  Abdominal: Soft. She exhibits no distension. There is tenderness. There is no rebound and no guarding.  Mild RLQ tenderness without guarding or rebound tenderness.    Genitourinary: Uterus normal. Rectal exam shows no external hemorrhoid, no mass and no tenderness. Guaiac negative stool. There is no rash, tenderness or lesion on the right labia. There is no rash, tenderness or lesion on the left labia. Cervix exhibits no motion tenderness and no friability. Right adnexum displays no mass and no tenderness. Left adnexum displays no mass and no tenderness. No bleeding in the vagina. No foreign body around the vagina. No vaginal discharge found.  No vaginal bleeding or CMT on exam.  No adnexal tenderness or masses. DRE is heme neg, brown stool.  No palpable masses, rectal tone is nml  Musculoskeletal: Normal range of motion.  Neurological: She is alert. She exhibits normal muscle tone. Coordination normal.  Skin: Skin is warm and dry.  Nursing note and vitals reviewed.   ED Course   Procedures (including critical care time) Labs Review Labs Reviewed  CBC WITH DIFFERENTIAL/PLATELET - Abnormal; Notable for the following:    Hemoglobin 15.6 (*)    All other components within normal limits  URINALYSIS, ROUTINE W REFLEX MICROSCOPIC - Abnormal; Notable for the following:    APPearance HAZY (*)    Specific Gravity, Urine >1.030 (*)    Hgb urine dipstick TRACE (*)    Leukocytes, UA MODERATE (*)    All other components within normal limits  URINE MICROSCOPIC-ADD ON - Abnormal; Notable for the following:    Squamous Epithelial / LPF MANY (*)    Bacteria, UA FEW (*)    All other components within normal limits  WET PREP, GENITAL  URINE CULTURE  PREGNANCY, URINE  COMPREHENSIVE METABOLIC PANEL  LIPASE, BLOOD  RPR  GC/CHLAMYDIA PROBE AMP ()    Imaging Review Ct Abdomen Pelvis W Contrast  01/20/2015   CLINICAL DATA:  44 year old female with right upper quadrant pain for 2 days with nausea  and vomiting.  EXAM: CT ABDOMEN AND PELVIS WITH CONTRAST  TECHNIQUE: Multidetector CT imaging of the abdomen and pelvis was performed using the standard protocol following bolus administration of intravenous contrast.  CONTRAST:  OMNIPAQUE IOHEXOL 300 MG/ML  SOLN  COMPARISON:  None.  FINDINGS: Lower chest:  Unremarkable  Hepatobiliary: Mild hepatic steatosis noted. No other hepatic abnormalities are identified. The gallbladder is unremarkable. There is no evidence of biliary dilatation.  Pancreas: Unremarkable  Spleen: Unremarkable  Adrenals/Urinary Tract: Bilateral extrarenal pelvises noted. There is no evidence of urinary calculi. A left renal cyst is noted. The adrenal glands and bladder are unremarkable.  Stomach/Bowel: Unremarkable. There is no evidence of bowel obstruction or wall thickening. The appendix is normal.  Vascular/Lymphatic: No enlarged lymph nodes or abdominal aortic aneurysm.  Reproductive: Unremarkable  Other: No free fluid, abscess or pneumoperitoneum.   Musculoskeletal: No acute or suspicious abnormalities identified.  IMPRESSION: No evidence of acute abnormality.  Mild hepatic steatosis.   Electronically Signed   By: Harmon Pier M.D.   On: 01/20/2015 14:52     EKG Interpretation None      MDM   Final diagnoses:  Right lower quadrant abdominal pain  Urinary tract infection without complication   STD and urine cultures pending.  CT neg for acute appendicitis.  Labs reassuring.  No concerning sx's for acute abdomen or TOA.  Pt is feeling better after medications and appears stable for d/c.  Patient advised of possibility of early appendicitis, she verbalized understanding and agrees to return here tomorrow for recheck if the pain is not improving.  Will treat for UTI.       Pauline Aus, PA-C 01/21/15 2232  Glynn Octave, MD 01/22/15 [phone]

## 2015-01-21 LAB — URINE CULTURE

## 2015-01-21 LAB — GC/CHLAMYDIA PROBE AMP (~~LOC~~) NOT AT ARMC
CHLAMYDIA, DNA PROBE: NEGATIVE
NEISSERIA GONORRHEA: NEGATIVE

## 2015-01-21 LAB — RPR: RPR Ser Ql: NONREACTIVE

## 2015-02-18 ENCOUNTER — Encounter (HOSPITAL_COMMUNITY): Payer: Self-pay | Admitting: *Deleted

## 2015-02-18 ENCOUNTER — Emergency Department (HOSPITAL_COMMUNITY)
Admission: EM | Admit: 2015-02-18 | Discharge: 2015-02-18 | Disposition: A | Discharge status: Home / Self Care | Payer: Medicare Other | Attending: Emergency Medicine | Admitting: Emergency Medicine

## 2015-02-18 DIAGNOSIS — E119 Type 2 diabetes mellitus without complications: Secondary | ICD-10-CM | POA: Diagnosis not present

## 2015-02-18 DIAGNOSIS — M797 Fibromyalgia: Secondary | ICD-10-CM | POA: Diagnosis not present

## 2015-02-18 DIAGNOSIS — Z79899 Other long term (current) drug therapy: Secondary | ICD-10-CM | POA: Insufficient documentation

## 2015-02-18 DIAGNOSIS — Y998 Other external cause status: Secondary | ICD-10-CM | POA: Insufficient documentation

## 2015-02-18 DIAGNOSIS — M47896 Other spondylosis, lumbar region: Secondary | ICD-10-CM

## 2015-02-18 DIAGNOSIS — W010XXA Fall on same level from slipping, tripping and stumbling without subsequent striking against object, initial encounter: Secondary | ICD-10-CM | POA: Insufficient documentation

## 2015-02-18 DIAGNOSIS — Y9389 Activity, other specified: Secondary | ICD-10-CM | POA: Diagnosis not present

## 2015-02-18 DIAGNOSIS — M069 Rheumatoid arthritis, unspecified: Secondary | ICD-10-CM | POA: Diagnosis not present

## 2015-02-18 DIAGNOSIS — M4306 Spondylolysis, lumbar region: Secondary | ICD-10-CM | POA: Insufficient documentation

## 2015-02-18 DIAGNOSIS — S39012A Strain of muscle, fascia and tendon of lower back, initial encounter: Secondary | ICD-10-CM

## 2015-02-18 DIAGNOSIS — G8929 Other chronic pain: Secondary | ICD-10-CM | POA: Diagnosis not present

## 2015-02-18 DIAGNOSIS — I10 Essential (primary) hypertension: Secondary | ICD-10-CM | POA: Diagnosis not present

## 2015-02-18 DIAGNOSIS — Y9289 Other specified places as the place of occurrence of the external cause: Secondary | ICD-10-CM | POA: Insufficient documentation

## 2015-02-18 DIAGNOSIS — S3992XA Unspecified injury of lower back, initial encounter: Secondary | ICD-10-CM | POA: Diagnosis present

## 2015-02-18 MED ORDER — DEXAMETHASONE SODIUM PHOSPHATE 4 MG/ML IJ SOLN
8.0000 mg | Freq: Once | INTRAMUSCULAR | Status: AC
Start: 2015-02-18 — End: 2015-02-18
  Administered 2015-02-18: 8 mg via INTRAMUSCULAR
  Filled 2015-02-18: qty 2

## 2015-02-18 MED ORDER — DICLOFENAC SODIUM 75 MG PO TBEC: 75.0000 mg | DELAYED_RELEASE_TABLET | Freq: Two times a day (BID) | ORAL | Status: DC

## 2015-02-18 MED ORDER — METHOCARBAMOL 500 MG PO TABS: 500.0000 mg | ORAL_TABLET | Freq: Three times a day (TID) | ORAL | Status: DC

## 2015-02-18 MED ORDER — DIAZEPAM 5 MG PO TABS
10.0000 mg | ORAL_TABLET | Freq: Once | ORAL | Status: AC
  Administered 2015-02-18: 10 mg via ORAL
  Filled 2015-02-18: qty 2

## 2015-02-18 NOTE — Discharge Instructions (Signed)
Please continue to alternate heat and ice to your lower back. Please use Robaxin 3 times daily, and diclofenac 2 times daily with food. May use Tylenol in between the diclofenac doses if needed. Please see Dr. Jeannetta Nap tomorrow or Wednesday for follow-up and for management of this problem. Lumbosacral Strain Lumbosacral strain is a strain of any of the parts that make up your lumbosacral vertebrae. Your lumbosacral vertebrae are the bones that make up the lower third of your backbone. Your lumbosacral vertebrae are held together by muscles and tough, fibrous tissue (ligaments).  CAUSES  A sudden blow to your back can cause lumbosacral strain. Also, anything that causes an excessive stretch of the muscles in the low back can cause this strain. This is typically seen when people exert themselves strenuously, fall, lift heavy objects, bend, or crouch repeatedly. RISK FACTORS  Physically demanding work.  Participation in pushing or pulling sports or sports that require a sudden twist of the back (tennis, golf, baseball).  Weight lifting.  Excessive lower back curvature.  Forward-tilted pelvis.  Weak back or abdominal muscles or both.  Tight hamstrings. SIGNS AND SYMPTOMS  Lumbosacral strain may cause pain in the area of your injury or pain that moves (radiates) down your leg.  DIAGNOSIS Your health care provider can often diagnose lumbosacral strain through a physical exam. In some cases, you may need tests such as X-ray exams.  TREATMENT  Treatment for your lower back injury depends on many factors that your clinician will have to evaluate. However, most treatment will include the use of anti-inflammatory medicines. HOME CARE INSTRUCTIONS   Avoid hard physical activities (tennis, racquetball, waterskiing) if you are not in proper physical condition for it. This may aggravate or create problems.  If you have a back problem, avoid sports requiring sudden body movements. Swimming and walking  are generally safer activities.  Maintain good posture.  Maintain a healthy weight.  For acute conditions, you may put ice on the injured area.  Put ice in a plastic bag.  Place a towel between your skin and the bag.  Leave the ice on for 20 minutes, 2-3 times a day.  When the low back starts healing, stretching and strengthening exercises may be recommended. SEEK MEDICAL CARE IF:  Your back pain is getting worse.  You experience severe back pain not relieved with medicines. SEEK IMMEDIATE MEDICAL CARE IF:   You have numbness, tingling, weakness, or problems with the use of your arms or legs.  There is a change in bowel or bladder control.  You have increasing pain in any area of the body, including your belly (abdomen).  You notice shortness of breath, dizziness, or feel faint.  You feel sick to your stomach (nauseous), are throwing up (vomiting), or become sweaty.  You notice discoloration of your toes or legs, or your feet get very cold. MAKE SURE YOU:   Understand these instructions.  Will watch your condition.  Will get help right away if you are not doing well or get worse. Document Released: 06/03/2005 Document Revised: 08/29/2013 Document Reviewed: 04/12/2013 [facility] Patient Information 2015 [place], [place]. This information is not intended to replace advice given to you by your health care provider. Make sure you discuss any questions you have with your health care provider.

## 2015-02-18 NOTE — ED Provider Notes (Signed)
CSN: [id number]     Arrival date & time 02/18/15  2034 History   None    Chief Complaint  Patient presents with  . Back Pain     (Consider location/radiation/quality/duration/timing/severity/associated sxs/prior Treatment) HPI Comments: Patient is a 44 year old female who presents to the emergency department with a complaint of lower back pain.  The patient states that while taking care of some dogs she tripped over a fine and injured her back. She denies any loss of bowel or bladder function. She denies any repeated falls. She states that she has tried alternating heat and ice as well as ibuprofen, but states this does not seem to be helping. It is of note that she has a history of chronic back pain, fibromyalgia, and rheumatoid arthritis. The patient states she has not been able to find anything that seems to help the pain. It has not interfered with her walking, or doing her activities of daily living. This occurred around 11:00 this morning. The pain seems to be getting worse instead of better during the day.  The history is provided by the patient.    Past Medical History  Diagnosis Date  . Hypertension   . Chronic back pain   . Sciatic pain     right  . Rib pain on right side     chronic  . Chronic neck pain   . Cervical radiculopathy   . Chronic knee pain   . Rheumatoid arthritis     "Rhematoid factor positive but no symptoms" per EPIC notes 2012  . Fibromyalgia   . Diabetes mellitus without complication    Past Surgical History  Procedure Laterality Date  . Tubal ligation    . Bladder surgery     History reviewed. No pertinent family history. History  Substance Use Topics  . Smoking status: Never Smoker   . Smokeless tobacco: Not on file  . Alcohol Use: No   OB History    Gravida Para Term Preterm AB TAB SAB Ectopic Multiple Living            3     Review of Systems  Musculoskeletal: Positive for back pain and arthralgias.  All other systems reviewed and  are negative.     Allergies  Penicillins; Tramadol; and Toradol  Home Medications   Prior to Admission medications   Medication Sig Start Date End Date Taking? Authorizing Provider  ibuprofen (ADVIL,MOTRIN) 200 MG tablet Take 600 mg by mouth every 4 (four) hours as needed for mild pain or moderate pain.   Yes Historical Provider, MD  lisinopril-hydrochlorothiazide (PRINZIDE,ZESTORETIC) 20-25 MG per tablet Take 2 tablets by mouth every morning.    Yes Historical Provider, MD  venlafaxine XR (EFFEXOR-XR) 75 MG 24 hr capsule Take 225 mg by mouth at bedtime.    Yes Historical Provider, MD  acetaminophen (TYLENOL) 500 MG tablet Take 1,000 mg by mouth every 6 (six) hours as needed.    Historical Provider, MD  ciprofloxacin (CIPRO) 500 MG tablet Take 1 tablet (500 mg total) by mouth 2 (two) times daily. For 5 days Patient not taking: Reported on 02/18/2015 01/20/15   Pauline Aus, PA-C  HYDROcodone-acetaminophen (NORCO/VICODIN) 5-325 MG per tablet Take one-two tabs po q 4-6 hrs prn pain Patient not taking: Reported on 02/18/2015 01/20/15   Tammy Triplett, PA-C   BP 144/100 mmHg  Pulse 84  Temp(Src) 98.6 F (37 C) (Oral)  Resp 18  Ht 5' (1.524 m)  Wt 198 lb (89.812 kg)  BMI  38.67 kg/m2  SpO2 98%  LMP 01/24/2015 Physical Exam  Constitutional: She is oriented to person, place, and time. She appears well-developed and well-nourished.  Non-toxic appearance.  HENT:  Head: Normocephalic.  Right Ear: Tympanic membrane and external ear normal.  Left Ear: Tympanic membrane and external ear normal.  Eyes: EOM and lids are normal. Pupils are equal, round, and reactive to light.  Neck: Normal range of motion. Neck supple. Carotid bruit is not present.  Cardiovascular: Normal rate, regular rhythm, normal heart sounds, intact distal pulses and normal pulses.   Pulmonary/Chest: Breath sounds normal. No respiratory distress.  Abdominal: Soft. Bowel sounds are normal. There is no tenderness. There is  no guarding.  Musculoskeletal: Normal range of motion.       Lumbar back: She exhibits tenderness and spasm. She exhibits no deformity.       Back:  Lymphadenopathy:       Head (right side): No submandibular adenopathy present.       Head (left side): No submandibular adenopathy present.    She has no cervical adenopathy.  Neurological: She is alert and oriented to person, place, and time. She has normal strength. No cranial nerve deficit or sensory deficit.  Grip is symmetrical. Motor strength of the upper extremities is symmetrical. Motor strength of lower extremities is symmetrical. Gait is steady. There is no foot drop appreciated.  Skin: Skin is warm and dry.  Psychiatric: She has a normal mood and affect. Her speech is normal.  Nursing note and vitals reviewed.   ED Course  Procedures (including critical care time) Labs Review Labs Reviewed - No data to display  Imaging Review No results found.   EKG Interpretation None      MDM  Patient sustained a fall from a standing position earlier this morning. The pain seems to be getting progressively worse as the day goes by. The pain has not responded to the patient's expectations with heat and ice and ibuprofen. There's been no loss of bowel or bladder function. There's been no evidence of caudal equina. No reported foot drop. Gait is steady. His been no aggressive sensory change or temperature change of the lower extremities.  The examination suggest mild muscle strain, and exacerbation of chronic back pain. The patient will be asked to continue the alternation of heat and ice. The patient will be treated with Robaxin and Voltaren. Patient will use Tylenol in between the Voltaren doses. Patient will follow with Dr. Shelah Lewandowsky who is a primary physician for additional management.    Final diagnoses:  None    *I have reviewed nursing notes, vital signs, and all appropriate lab and imaging results for this patient.**    Ivery Quale, PA-C 02/18/15 2233  Ivery Quale, PA-C 02/18/15 2242  Layla Maw Ward, DO 02/18/15 2306

## 2015-02-18 NOTE — ED Notes (Signed)
Pt states she was chasing a dog and tripped and fell over a vine and hurt her back; pt states she has been using ice and heat with ibuprofen with no relief all day

## 2015-02-23 ENCOUNTER — Emergency Department (HOSPITAL_COMMUNITY): Payer: Medicare Other

## 2015-02-23 ENCOUNTER — Encounter (HOSPITAL_COMMUNITY): Payer: Self-pay | Admitting: Emergency Medicine

## 2015-02-23 ENCOUNTER — Emergency Department (HOSPITAL_COMMUNITY)
Admission: EM | Admit: 2015-02-23 | Discharge: 2015-02-23 | Disposition: A | Discharge status: Home / Self Care | Payer: Medicare Other | Attending: Emergency Medicine | Admitting: Emergency Medicine

## 2015-02-23 DIAGNOSIS — M797 Fibromyalgia: Secondary | ICD-10-CM | POA: Insufficient documentation

## 2015-02-23 DIAGNOSIS — Z79899 Other long term (current) drug therapy: Secondary | ICD-10-CM | POA: Diagnosis not present

## 2015-02-23 DIAGNOSIS — I10 Essential (primary) hypertension: Secondary | ICD-10-CM | POA: Insufficient documentation

## 2015-02-23 DIAGNOSIS — Z3202 Encounter for pregnancy test, result negative: Secondary | ICD-10-CM | POA: Insufficient documentation

## 2015-02-23 DIAGNOSIS — M5431 Sciatica, right side: Secondary | ICD-10-CM | POA: Insufficient documentation

## 2015-02-23 DIAGNOSIS — E119 Type 2 diabetes mellitus without complications: Secondary | ICD-10-CM | POA: Diagnosis not present

## 2015-02-23 DIAGNOSIS — Z791 Long term (current) use of non-steroidal anti-inflammatories (NSAID): Secondary | ICD-10-CM | POA: Diagnosis not present

## 2015-02-23 DIAGNOSIS — N12 Tubulo-interstitial nephritis, not specified as acute or chronic: Secondary | ICD-10-CM

## 2015-02-23 DIAGNOSIS — M069 Rheumatoid arthritis, unspecified: Secondary | ICD-10-CM | POA: Diagnosis not present

## 2015-02-23 DIAGNOSIS — E669 Obesity, unspecified: Secondary | ICD-10-CM | POA: Insufficient documentation

## 2015-02-23 DIAGNOSIS — G8929 Other chronic pain: Secondary | ICD-10-CM | POA: Diagnosis not present

## 2015-02-23 DIAGNOSIS — R1011 Right upper quadrant pain: Secondary | ICD-10-CM | POA: Diagnosis present

## 2015-02-23 DIAGNOSIS — Z880 Allergy status to penicillin: Secondary | ICD-10-CM | POA: Diagnosis not present

## 2015-02-23 LAB — CBC WITH DIFFERENTIAL/PLATELET
Basophils Absolute: 0 10*3/uL (ref 0.0–0.1)
Basophils Relative: 0 % (ref 0–1)
Eosinophils Absolute: 0.3 10*3/uL (ref 0.0–0.7)
Eosinophils Relative: 2 % (ref 0–5)
HEMATOCRIT: 43.6 % (ref 36.0–46.0)
Hemoglobin: 15.4 g/dL — ABNORMAL HIGH (ref 12.0–15.0)
Lymphocytes Relative: 37 % (ref 12–46)
Lymphs Abs: 4.2 10*3/uL — ABNORMAL HIGH (ref 0.7–4.0)
MCH: 30.6 pg (ref 26.0–34.0)
MCHC: 35.3 g/dL (ref 30.0–36.0)
MCV: 86.7 fL (ref 78.0–100.0)
MONO ABS: 0.4 10*3/uL (ref 0.1–1.0)
Monocytes Relative: 4 % (ref 3–12)
Neutro Abs: 6.5 10*3/uL (ref 1.7–7.7)
Neutrophils Relative %: 57 % (ref 43–77)
Platelets: 407 10*3/uL — ABNORMAL HIGH (ref 150–400)
RBC: 5.03 MIL/uL (ref 3.87–5.11)
RDW: 13 % (ref 11.5–15.5)
WBC: 11.3 10*3/uL — ABNORMAL HIGH (ref 4.0–10.5)

## 2015-02-23 LAB — COMPREHENSIVE METABOLIC PANEL
ALBUMIN: 4.3 g/dL (ref 3.5–5.0)
ALT: 16 U/L (ref 14–54)
ANION GAP: 11 (ref 5–15)
AST: 19 U/L (ref 15–41)
Alkaline Phosphatase: 33 U/L — ABNORMAL LOW (ref 38–126)
BUN: 8 mg/dL (ref 6–20)
CALCIUM: 9.2 mg/dL (ref 8.9–10.3)
CO2: 23 mmol/L (ref 22–32)
CREATININE: 0.7 mg/dL (ref 0.44–1.00)
Chloride: 103 mmol/L (ref 101–111)
GFR calc Af Amer: >60 mL/min (ref >60)
GFR calc non Af Amer: >60 mL/min (ref >60)
GLUCOSE: 100 mg/dL — AB (ref 65–99)
Potassium: 3.7 mmol/L (ref 3.5–5.1)
Sodium: 137 mmol/L (ref 135–145)
TOTAL PROTEIN: 8.3 g/dL — AB (ref 6.5–8.1)
Total Bilirubin: 0.5 mg/dL (ref 0.3–1.2)

## 2015-02-23 LAB — URINALYSIS, ROUTINE W REFLEX MICROSCOPIC
Bilirubin Urine: NEGATIVE
Glucose, UA: NEGATIVE mg/dL
Ketones, ur: NEGATIVE mg/dL
NITRITE: NEGATIVE
PH: 6 (ref 5.0–8.0)
PROTEIN: NEGATIVE mg/dL
SPECIFIC GRAVITY, URINE: 1.025 (ref 1.005–1.030)
UROBILINOGEN UA: 0.2 mg/dL (ref 0.0–1.0)

## 2015-02-23 LAB — URINE MICROSCOPIC-ADD ON

## 2015-02-23 LAB — PREGNANCY, URINE: PREG TEST UR: NEGATIVE

## 2015-02-23 LAB — LIPASE, BLOOD: Lipase: 51 U/L (ref 22–51)

## 2015-02-23 MED ORDER — ONDANSETRON HCL 4 MG/2ML IJ SOLN
4.0000 mg | Freq: Once | INTRAMUSCULAR | Status: AC
  Administered 2015-02-23: 4 mg via INTRAVENOUS
  Filled 2015-02-23: qty 2

## 2015-02-23 MED ORDER — FENTANYL CITRATE (PF) 100 MCG/2ML IJ SOLN
100.0000 ug | INTRAMUSCULAR | Status: AC | PRN
  Administered 2015-02-23 (×3): 100 ug via INTRAVENOUS
  Filled 2015-02-23 (×3): qty 2

## 2015-02-23 MED ORDER — SODIUM CHLORIDE 0.9 % IV BOLUS (SEPSIS)
500.0000 mL | Freq: Once | INTRAVENOUS | Status: AC
  Administered 2015-02-23: 500 mL via INTRAVENOUS

## 2015-02-23 MED ORDER — SODIUM CHLORIDE 0.9 % IV SOLN
INTRAVENOUS | Status: DC
  Administered 2015-02-23: 16:00:00 via INTRAVENOUS

## 2015-02-23 MED ORDER — CEPHALEXIN 500 MG PO CAPS
500.0000 mg | ORAL_CAPSULE | Freq: Four times a day (QID) | ORAL | Status: DC
Start: 2015-02-23 — End: 2015-03-31

## 2015-02-23 MED ORDER — OXYCODONE-ACETAMINOPHEN 5-325 MG PO TABS
1.0000 | ORAL_TABLET | Freq: Once | ORAL | Status: AC
  Administered 2015-02-23: 1 via ORAL

## 2015-02-23 MED ORDER — DEXTROSE 5 % IV SOLN
1.0000 g | Freq: Once | INTRAVENOUS | Status: AC
  Administered 2015-02-23: 1 g via INTRAVENOUS
  Filled 2015-02-23: qty 10

## 2015-02-23 MED ORDER — OXYCODONE-ACETAMINOPHEN 5-325 MG PO TABS
ORAL_TABLET | ORAL | Status: AC
  Filled 2015-02-23: qty 1

## 2015-02-23 MED ORDER — HYDROMORPHONE HCL 1 MG/ML IJ SOLN
1.0000 mg | Freq: Once | INTRAMUSCULAR | Status: AC
  Administered 2015-02-23: 1 mg via INTRAVENOUS
  Filled 2015-02-23: qty 1

## 2015-02-23 MED ORDER — OXYCODONE-ACETAMINOPHEN 5-325 MG PO TABS: 1.0000 | ORAL_TABLET | ORAL | Status: DC | PRN

## 2015-02-23 NOTE — ED Notes (Signed)
Pt made aware that a urine sample is needed.  

## 2015-02-23 NOTE — ED Notes (Addendum)
Pt states that ginger ale made her nauseated. MD Effie Shy made aware.

## 2015-02-23 NOTE — ED Provider Notes (Signed)
CSN: [id number]     Arrival date & time 02/23/15  1529 History   First MD Initiated Contact with Patient 02/23/15 1539     Chief Complaint  Patient presents with  . Abdominal Pain     (Consider location/radiation/quality/duration/timing/severity/associated sxs/prior Treatment) HPI   Regina Patton is a 44 y.o. female who presents with bilateral upper abdominal pain radiating to the right upper back, since earlier today. It is associated with nausea, but not vomiting. She has had 4 loose bowel movements, like water, brown in color. Is no lower abdominal pain. She denies fever, chills, cough, shortness of breath, chest pain, weakness or dizziness. She's not had this previously. There are no other known modifying factors.   Past Medical History  Diagnosis Date  . Hypertension   . Chronic back pain   . Sciatic pain     right  . Rib pain on right side     chronic  . Chronic neck pain   . Cervical radiculopathy   . Chronic knee pain   . Rheumatoid arthritis     "Rhematoid factor positive but no symptoms" per EPIC notes 2012  . Fibromyalgia   . Diabetes mellitus without complication    Past Surgical History  Procedure Laterality Date  . Tubal ligation    . Bladder surgery     History reviewed. No pertinent family history. History  Substance Use Topics  . Smoking status: Never Smoker   . Smokeless tobacco: Not on file  . Alcohol Use: No   OB History    Gravida Para Term Preterm AB TAB SAB Ectopic Multiple Living            3     Review of Systems  All other systems reviewed and are negative.     Allergies  Mango flavor; Penicillins; Tramadol; and Toradol  Home Medications   Prior to Admission medications   Medication Sig Start Date End Date Taking? Authorizing Provider  acetaminophen (TYLENOL) 500 MG tablet Take 1,000 mg by mouth every 6 (six) hours as needed.   Yes Historical Provider, MD  ibuprofen (ADVIL,MOTRIN) 200 MG tablet Take 600 mg by mouth every 4  (four) hours as needed for mild pain or moderate pain.   Yes Historical Provider, MD  lisinopril-hydrochlorothiazide (PRINZIDE,ZESTORETIC) 20-25 MG per tablet Take 2 tablets by mouth every morning.    Yes Historical Provider, MD  venlafaxine XR (EFFEXOR-XR) 75 MG 24 hr capsule Take 225 mg by mouth at bedtime.    Yes Historical Provider, MD  cephALEXin (KEFLEX) 500 MG capsule Take 1 capsule (500 mg total) by mouth 4 (four) times daily. 02/23/15   Mancel Bale, MD  diclofenac (VOLTAREN) 75 MG EC tablet Take 1 tablet (75 mg total) by mouth 2 (two) times daily. 02/18/15   Ivery Quale, PA-C  methocarbamol (ROBAXIN) 500 MG tablet Take 1 tablet (500 mg total) by mouth 3 (three) times daily. 02/18/15   Ivery Quale, PA-C  oxyCODONE-acetaminophen (PERCOCET) 5-325 MG per tablet Take 1 tablet by mouth every 4 (four) hours as needed for severe pain. 02/23/15   Mancel Bale, MD   BP 139/113 mmHg  Pulse 96  Temp(Src) 98.5 F (36.9 C) (Oral)  Resp 16  Ht 5' (1.524 m)  Wt 198 lb (89.812 kg)  BMI 38.67 kg/m2  SpO2 94%  LMP 02/23/2015 Physical Exam  Constitutional: She is oriented to person, place, and time. She appears well-developed. She appears distressed (Uncomfortable).  Obese  HENT:  Head: Normocephalic and  atraumatic.  Right Ear: External ear normal.  Left Ear: External ear normal.  Eyes: Conjunctivae and EOM are normal. Pupils are equal, round, and reactive to light.  Neck: Normal range of motion and phonation normal. Neck supple.  Cardiovascular: Normal rate, regular rhythm and normal heart sounds.   Pulmonary/Chest: Effort normal and breath sounds normal. No respiratory distress. She has no wheezes. She exhibits no bony tenderness.  Abdominal: Soft. She exhibits no mass. There is tenderness (Nuts, right upper quadrant, mild.). There is no rebound and no guarding.  Musculoskeletal: Normal range of motion. She exhibits no edema.  Neurological: She is alert and oriented to person, place, and  time. No cranial nerve deficit or sensory deficit. She exhibits normal muscle tone. Coordination normal.  Skin: Skin is warm, dry and intact.  Psychiatric: She has a normal mood and affect. Her behavior is normal. Judgment and thought content normal.  Nursing note and vitals reviewed.   ED Course  Procedures (including critical care time) 16:15- Initial evaluation, atypical upper abdominal pain, possible pancreatitis versus Nonspecific enteritis versus gallbladder disease.  Ultrasound ordered, but not available at this time. Will treat symptomatically and observe, with testing, such as is available.  Medications  0.9 %  sodium chloride infusion ( Intravenous New Bag/Given 02/23/15 1613)  ondansetron (ZOFRAN) injection 4 mg (4 mg Intravenous Given 02/23/15 1613)  sodium chloride 0.9 % bolus 500 mL (0 mLs Intravenous Stopped 02/23/15 1711)  fentaNYL (SUBLIMAZE) injection 100 mcg (100 mcg Intravenous Given 02/23/15 1904)  ondansetron (ZOFRAN) injection 4 mg (4 mg Intravenous Given 02/23/15 1740)  HYDROmorphone (DILAUDID) injection 1 mg (1 mg Intravenous Given 02/23/15 2025)  cefTRIAXone (ROCEPHIN) 1 g in dextrose 5 % 50 mL IVPB (0 g Intravenous Stopped 02/23/15 2106)  oxyCODONE-acetaminophen (PERCOCET/ROXICET) 5-325 MG per tablet 1 tablet (1 tablet Oral Given 02/23/15 2217)    Patient Vitals for the past 24 hrs:  BP Temp Temp src Pulse Resp SpO2 Height Weight  02/23/15 1900 (!) 139/113 mmHg - - 96 - 94 % - -  02/23/15 1830 144/96 mmHg - - 92 - 92 % - -  02/23/15 1800 136/99 mmHg - - 90 - 94 % - -  02/23/15 1730 (!) 148/103 mmHg - - 86 - 93 % - -  02/23/15 1700 130/91 mmHg - - 89 - 94 % - -  02/23/15 1656 153/90 mmHg 98.5 F (36.9 C) Oral 83 16 100 % - -  02/23/15 1630 146/100 mmHg - - 87 - 90 % - -  02/23/15 1600 146/100 mmHg - - 93 - 96 % - -  02/23/15 1538 (!) 149/103 mmHg - - 101 - 97 % - -  02/23/15 1532 (!) 164/115 mmHg 98.9 F (37.2 C) Oral 92 18 96 % 5' (1.524 m) 198 lb (89.812 kg)     8:12 PM Reevaluation with update and discussion. After initial assessment and treatment, an updated evaluation reveals she continues to be uncomfortable, despite 3 doses of fentanyl. At this time. She has right upper quadrant pain radiating to the right flank. She is rocking with pain. She now reports urinary frequency but continues to deny urinary dysuria or hematuria. CT scan abdomen pelvis ordered without contrast to evaluate for ureteral stone. Amiah Frohlich L     Labs Review Labs Reviewed  COMPREHENSIVE METABOLIC PANEL - Abnormal; Notable for the following:    Glucose, Bld 100 (*)    Total Protein 8.3 (*)    Alkaline Phosphatase 33 (*)    All  other components within normal limits  CBC WITH DIFFERENTIAL/PLATELET - Abnormal; Notable for the following:    WBC 11.3 (*)    Hemoglobin 15.4 (*)    Platelets 407 (*)    Lymphs Abs 4.2 (*)    All other components within normal limits  URINALYSIS, ROUTINE W REFLEX MICROSCOPIC (NOT AT [facility]) - Abnormal; Notable for the following:    APPearance CLOUDY (*)    Hgb urine dipstick LARGE (*)    Leukocytes, UA MODERATE (*)    All other components within normal limits  URINE MICROSCOPIC-ADD ON - Abnormal; Notable for the following:    Squamous Epithelial / LPF FEW (*)    Bacteria, UA MANY (*)    All other components within normal limits  URINE CULTURE  LIPASE, BLOOD  PREGNANCY, URINE    Imaging Review Ct Renal Stone Study  02/23/2015   CLINICAL DATA:  44 year old female with abdominal pain nausea and diarrhea since this morning. Initial encounter.  EXAM: CT ABDOMEN AND PELVIS WITHOUT CONTRAST  TECHNIQUE: Multidetector CT imaging of the abdomen and pelvis was performed following the standard protocol without IV contrast.  COMPARISON:  CT Abdomen and Pelvis 01/20/2015  FINDINGS: Large body habitus. Stable, negative lung bases. No pericardial or pleural effusion.  No acute osseous abnormality identified. Lower lumbar facet degeneration.  No  pelvic free fluid. Negative non contrast uterus and adnexa. Numerous pelvic phleboliths Re identified. Decompressed distal colon.  Decompressed left colon. Normal transverse colon. Normal right colon and appendix. Normal terminal ileum. No dilated small bowel. Negative noncontrast stomach and duodenum.  Noncontrast liver, gallbladder, spleen, pancreas, and adrenal glands are within normal limits. No abdominal free fluid. No lymphadenopathy. Minimal calcified atherosclerosis.  Stable and negative left kidney and ureter. Left midpole cyst better demonstrated on the prior study. Vascular calcifications along the course of the left ureter are unchanged.  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  Stable and negative right kidney and ureter. Stable phleboliths along the course of the right ureter.  IMPRESSION: 1. No acute or inflammatory process identified. 2. 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   Electronically Signed   By: Odessa Fleming M.D.   On: 02/23/2015 21:04     EKG Interpretation None      MDM   Final diagnoses:  Pyelonephritis    Flank pain related to urinary tract infection with likely mild pyelonephritis with evidence for systemic infection. Doubt serious bacterial infection. Metabolic instability or impending vascular collapse.  Nursing Notes Reviewed/ Care Coordinated Applicable Imaging Reviewed Interpretation of Laboratory Data incorporated into ED treatment  The patient appears reasonably screened and/or stabilized for discharge and I doubt any other medical condition or other [facility] requiring further screening, evaluation, or treatment in the ED at this time prior to discharge.  Plan: Home Medications- Percocet and Keflex; Home Treatments- rest, fluids; return here if the recommended treatment, does not improve the symptoms; Recommended follow up- PCP  prn     Mancel Bale, MD 02/23/15 2229

## 2015-02-23 NOTE — ED Notes (Signed)
Pt reports abdominal pain,nausea,diarrhea since this am.

## 2015-02-23 NOTE — ED Notes (Signed)
Gave pt ginger ale. Pt tolerating well at this time.

## 2015-02-23 NOTE — ED Notes (Signed)
MD Wentz at bedside.  

## 2015-02-27 ENCOUNTER — Telehealth (HOSPITAL_BASED_OUTPATIENT_CLINIC_OR_DEPARTMENT_OTHER): Payer: Self-pay | Admitting: Emergency Medicine

## 2015-02-27 LAB — URINE CULTURE: Special Requests: NORMAL

## 2015-02-27 NOTE — Telephone Encounter (Signed)
Post ED Visit - Positive Culture Follow-up  Culture report reviewed by antimicrobial stewardship pharmacist: []  Wes Dulaney, Pharm.D., BCPS []  , Pharm.D., BCPS [x]  , Celedonio Miyamoto.D., BCPS []  [place], Georgina Pillion.D., BCPS, AAHIVP []  [address], Pharm.D., BCPS, AAHIVP []  , [place].D., BCPS  Positive urine culture Treated with cephalexin, organism sensitive to the same and no further patient follow-up is required at this time.  [place] 02/27/2015, 2:14 PM

## 2015-03-16 ENCOUNTER — Encounter (HOSPITAL_COMMUNITY): Payer: Self-pay | Admitting: *Deleted

## 2015-03-16 ENCOUNTER — Emergency Department (HOSPITAL_COMMUNITY): Payer: Medicare Other

## 2015-03-16 ENCOUNTER — Emergency Department (HOSPITAL_COMMUNITY)
Admission: EM | Admit: 2015-03-16 | Discharge: 2015-03-16 | Disposition: A | Discharge status: Home / Self Care | Payer: Medicare Other | Attending: Emergency Medicine | Admitting: Emergency Medicine

## 2015-03-16 DIAGNOSIS — R109 Unspecified abdominal pain: Secondary | ICD-10-CM | POA: Diagnosis present

## 2015-03-16 DIAGNOSIS — G8929 Other chronic pain: Secondary | ICD-10-CM | POA: Diagnosis not present

## 2015-03-16 DIAGNOSIS — Z880 Allergy status to penicillin: Secondary | ICD-10-CM | POA: Diagnosis not present

## 2015-03-16 DIAGNOSIS — I10 Essential (primary) hypertension: Secondary | ICD-10-CM | POA: Insufficient documentation

## 2015-03-16 DIAGNOSIS — Z79899 Other long term (current) drug therapy: Secondary | ICD-10-CM | POA: Diagnosis not present

## 2015-03-16 DIAGNOSIS — Z792 Long term (current) use of antibiotics: Secondary | ICD-10-CM | POA: Insufficient documentation

## 2015-03-16 DIAGNOSIS — Z8739 Personal history of other diseases of the musculoskeletal system and connective tissue: Secondary | ICD-10-CM | POA: Insufficient documentation

## 2015-03-16 DIAGNOSIS — N390 Urinary tract infection, site not specified: Secondary | ICD-10-CM | POA: Diagnosis not present

## 2015-03-16 DIAGNOSIS — E119 Type 2 diabetes mellitus without complications: Secondary | ICD-10-CM | POA: Diagnosis not present

## 2015-03-16 DIAGNOSIS — Z791 Long term (current) use of non-steroidal anti-inflammatories (NSAID): Secondary | ICD-10-CM | POA: Insufficient documentation

## 2015-03-16 LAB — CBC WITH DIFFERENTIAL/PLATELET
BASOS ABS: 0 10*3/uL (ref 0.0–0.1)
Basophils Relative: 0 % (ref 0–1)
EOS ABS: 0.3 10*3/uL (ref 0.0–0.7)
Eosinophils Relative: 4 % (ref 0–5)
HCT: 38.5 % (ref 36.0–46.0)
Hemoglobin: 13.2 g/dL (ref 12.0–15.0)
LYMPHS PCT: 39 % (ref 12–46)
Lymphs Abs: 2.9 10*3/uL (ref 0.7–4.0)
MCH: 30.3 pg (ref 26.0–34.0)
MCHC: 34.3 g/dL (ref 30.0–36.0)
MCV: 88.5 fL (ref 78.0–100.0)
Monocytes Absolute: 0.4 10*3/uL (ref 0.1–1.0)
Monocytes Relative: 5 % (ref 3–12)
NEUTROS PCT: 52 % (ref 43–77)
Neutro Abs: 3.9 10*3/uL (ref 1.7–7.7)
PLATELETS: 296 10*3/uL (ref 150–400)
RBC: 4.35 MIL/uL (ref 3.87–5.11)
RDW: 13.2 % (ref 11.5–15.5)
WBC: 7.4 10*3/uL (ref 4.0–10.5)

## 2015-03-16 LAB — COMPREHENSIVE METABOLIC PANEL
ALT: 17 U/L (ref 14–54)
AST: 18 U/L (ref 15–41)
Albumin: 3.9 g/dL (ref 3.5–5.0)
Alkaline Phosphatase: 34 U/L — ABNORMAL LOW (ref 38–126)
Anion gap: 7 (ref 5–15)
BUN: 10 mg/dL (ref 6–20)
CO2: 25 mmol/L (ref 22–32)
Calcium: 8.8 mg/dL — ABNORMAL LOW (ref 8.9–10.3)
Chloride: 104 mmol/L (ref 101–111)
Creatinine, Ser: 0.69 mg/dL (ref 0.44–1.00)
GFR calc non Af Amer: >60 mL/min (ref >60)
GLUCOSE: 95 mg/dL (ref 65–99)
POTASSIUM: 3.8 mmol/L (ref 3.5–5.1)
SODIUM: 136 mmol/L (ref 135–145)
TOTAL PROTEIN: 7.3 g/dL (ref 6.5–8.1)
Total Bilirubin: 0.5 mg/dL (ref 0.3–1.2)

## 2015-03-16 LAB — URINALYSIS, ROUTINE W REFLEX MICROSCOPIC
Bilirubin Urine: NEGATIVE
GLUCOSE, UA: NEGATIVE mg/dL
Ketones, ur: NEGATIVE mg/dL
NITRITE: POSITIVE — AB
PH: 6 (ref 5.0–8.0)
Protein, ur: 100 mg/dL — AB
Specific Gravity, Urine: 1.025 (ref 1.005–1.030)
UROBILINOGEN UA: 0.2 mg/dL (ref 0.0–1.0)

## 2015-03-16 LAB — URINE MICROSCOPIC-ADD ON

## 2015-03-16 LAB — LIPASE, BLOOD: Lipase: 55 U/L — ABNORMAL HIGH (ref 22–51)

## 2015-03-16 MED ORDER — ONDANSETRON 4 MG PO TBDP: 4.0000 mg | ORAL_TABLET | Freq: Once | ORAL | Status: DC

## 2015-03-16 MED ORDER — CIPROFLOXACIN HCL 250 MG PO TABS: 500.0000 mg | ORAL_TABLET | Freq: Once | ORAL | Status: DC

## 2015-03-16 MED ORDER — CIPROFLOXACIN HCL 500 MG PO TABS: 500.0000 mg | ORAL_TABLET | Freq: Two times a day (BID) | ORAL | Status: DC

## 2015-03-16 MED ORDER — METOCLOPRAMIDE HCL 5 MG/ML IJ SOLN
10.0000 mg | Freq: Once | INTRAMUSCULAR | Status: AC
  Administered 2015-03-16: 10 mg via INTRAVENOUS
  Filled 2015-03-16: qty 2

## 2015-03-16 MED ORDER — OXYCODONE-ACETAMINOPHEN 5-325 MG PO TABS: 1.0000 | ORAL_TABLET | ORAL | Status: DC | PRN

## 2015-03-16 MED ORDER — ONDANSETRON HCL 4 MG/2ML IJ SOLN
4.0000 mg | Freq: Once | INTRAMUSCULAR | Status: DC
  Filled 2015-03-16: qty 2

## 2015-03-16 MED ORDER — OXYCODONE-ACETAMINOPHEN 5-325 MG PO TABS: 2.0000 | ORAL_TABLET | Freq: Once | ORAL | Status: DC

## 2015-03-16 MED ORDER — ONDANSETRON HCL 4 MG/2ML IJ SOLN
4.0000 mg | Freq: Once | INTRAMUSCULAR | Status: AC
  Administered 2015-03-16: 4 mg via INTRAVENOUS

## 2015-03-16 MED ORDER — ONDANSETRON 8 MG PO TBDP: 8.0000 mg | ORAL_TABLET | Freq: Three times a day (TID) | ORAL | Status: DC | PRN

## 2015-03-16 NOTE — Discharge Instructions (Signed)

## 2015-03-16 NOTE — ED Provider Notes (Signed)
CSN: [id number]     Arrival date & time 03/16/15  1218 History  This chart was scribed for Margarita Grizzle, MD by Tanda Rockers, ED Scribe. This patient was seen in room APA06/APA06 and the patient's care was started at 12:52 PM.  Chief Complaint  Patient presents with  . Abdominal Pain   Patient is a 44 y.o. female presenting with abdominal pain. The history is provided by the patient. No language interpreter was used.  Abdominal Pain Associated symptoms: diarrhea, nausea and vomiting      HPI Comments: Regina Patton is a 44 y.o. female who presents to the Emergency Department complaining of intermittent, RUQ abdominal pain radiating to back x 1 day ago. Pt also complains of nausea, vomiting, and diarrhea. No modifying factors to the pain. No meds PTA. Pt was seen in ED on 02/23/2015 for similar symptoms. She was treated for UTI at that time. Pt states that her symptoms resolved at that time but returned 1.5 weeks later. Pt has had 3 episodes of this abdominal pain in the last month. Denies any chance of pregnancy. PSHx bladder surgery (x2) and tubal ligation.    Past Medical History  Diagnosis Date  . Hypertension   . Chronic back pain   . Sciatic pain     right  . Rib pain on right side     chronic  . Chronic neck pain   . Cervical radiculopathy   . Chronic knee pain   . Rheumatoid arthritis     "Rhematoid factor positive but no symptoms" per EPIC notes 2012  . Fibromyalgia   . Diabetes mellitus without complication    Past Surgical History  Procedure Laterality Date  . Tubal ligation    . Bladder surgery     History reviewed. No pertinent family history. History  Substance Use Topics  . Smoking status: Never Smoker   . Smokeless tobacco: Not on file  . Alcohol Use: No   OB History    Gravida Para Term Preterm AB TAB SAB Ectopic Multiple Living            3     Review of Systems  Gastrointestinal: Positive for nausea, vomiting, abdominal pain and diarrhea.  All  other systems reviewed and are negative.     Allergies  Mango flavor; Penicillins; Tramadol; and Toradol  Home Medications   Prior to Admission medications   Medication Sig Start Date End Date Taking? Authorizing Provider  acetaminophen (TYLENOL) 500 MG tablet Take 1,000 mg by mouth every 6 (six) hours as needed.    Historical Provider, MD  cephALEXin (KEFLEX) 500 MG capsule Take 1 capsule (500 mg total) by mouth 4 (four) times daily. 02/23/15   Mancel Bale, MD  diclofenac (VOLTAREN) 75 MG EC tablet Take 1 tablet (75 mg total) by mouth 2 (two) times daily. 02/18/15   Ivery Quale, PA-C  ibuprofen (ADVIL,MOTRIN) 200 MG tablet Take 600 mg by mouth every 4 (four) hours as needed for mild pain or moderate pain.    Historical Provider, MD  lisinopril-hydrochlorothiazide (PRINZIDE,ZESTORETIC) 20-25 MG per tablet Take 2 tablets by mouth every morning.     Historical Provider, MD  methocarbamol (ROBAXIN) 500 MG tablet Take 1 tablet (500 mg total) by mouth 3 (three) times daily. 02/18/15   Ivery Quale, PA-C  oxyCODONE-acetaminophen (PERCOCET) 5-325 MG per tablet Take 1 tablet by mouth every 4 (four) hours as needed for severe pain. 02/23/15   Mancel Bale, MD  venlafaxine XR (EFFEXOR-XR) 75  MG 24 hr capsule Take 225 mg by mouth at bedtime.     Historical Provider, MD   Triage Vitals: BP 137/97 mmHg  Pulse 97  Temp(Src) 98.9 F (37.2 C) (Oral)  Resp 14  SpO2 99%  LMP 02/23/2015   Physical Exam  Constitutional: She is oriented to person, place, and time. She appears well-developed and well-nourished.  HENT:  Head: Normocephalic and atraumatic.  Right Ear: External ear normal.  Left Ear: External ear normal.  Nose: Nose normal.  Mouth/Throat: Oropharynx is clear and moist.  Eyes: Conjunctivae and EOM are normal. Pupils are equal, round, and reactive to light.  Neck: Normal range of motion. Neck supple. No JVD present. No tracheal deviation present. No thyromegaly present.   Cardiovascular: Normal rate, regular rhythm, normal heart sounds and intact distal pulses.   Pulmonary/Chest: Effort normal and breath sounds normal. She has no wheezes.  Abdominal: Soft. Bowel sounds are normal. She exhibits no mass. There is no tenderness. There is no guarding.  Genitourinary:  No cva ttp  Musculoskeletal: Normal range of motion.  Lymphadenopathy:    She has no cervical adenopathy.  Neurological: She is alert and oriented to person, place, and time. She has normal reflexes. No cranial nerve deficit or sensory deficit. Gait normal. GCS eye subscore is 4. GCS verbal subscore is 5. GCS motor subscore is 6.  Reflex Scores:      Bicep reflexes are 2+ on the right side and 2+ on the left side.      Patellar reflexes are 2+ on the right side and 2+ on the left side. Strength is normal and equal throughout. Cranial nerves grossly intact. Patient fluent. No gross ataxia and patient able to ambulate without difficulty.  Skin: Skin is warm and dry.  Psychiatric: She has a normal mood and affect. Her behavior is normal. Judgment and thought content normal.  Nursing note and vitals reviewed.    ED Course  Procedures (including critical care time)  DIAGNOSTIC STUDIES: Oxygen Saturation is 99% on RA, normal by my interpretation.    COORDINATION OF CARE: 12:58 PM-Discussed treatment plan which with pt at bedside and pt agreed to plan.   Labs Review Labs Reviewed  COMPREHENSIVE METABOLIC PANEL - Abnormal; Notable for the following:    Calcium 8.8 (*)    Alkaline Phosphatase 34 (*)    All other components within normal limits  LIPASE, BLOOD - Abnormal; Notable for the following:    Lipase 55 (*)    All other components within normal limits  URINALYSIS, ROUTINE W REFLEX MICROSCOPIC (NOT AT [facility]) - Abnormal; Notable for the following:    APPearance TURBID (*)    Hgb urine dipstick LARGE (*)    Protein, ur 100 (*)    Nitrite POSITIVE (*)    Leukocytes, UA MODERATE (*)     All other components within normal limits  URINE MICROSCOPIC-ADD ON - Abnormal; Notable for the following:    Bacteria, UA MANY (*)    All other components within normal limits  CBC WITH DIFFERENTIAL/PLATELET  OCCULT BLOOD X 1 CARD TO LAB, STOOL    Imaging Review Dg Chest 2 View  03/16/2015   CLINICAL DATA:  Cough, back pain.  EXAM: CHEST  2 VIEW  COMPARISON:  February 23, 2014.  FINDINGS: The heart size and mediastinal contours are within normal limits. Both lungs are clear. No pneumothorax or pleural effusion is noted. The visualized skeletal structures are unremarkable.  IMPRESSION: No active cardiopulmonary disease.  Electronically Signed   By: Lupita Raider, M.D.   On: 03/16/2015 14:21   Dg Abd 1 View  03/16/2015   CLINICAL DATA:  Nausea/vomiting, right upper quadrant pain  EXAM: ABDOMEN - 1 VIEW  COMPARISON:  CT abdomen pelvis dated 02/23/2015  FINDINGS: Nonobstructive bowel gas pattern.  Moderate stool in the cecum/ascending colon.  Calcified pelvic phleboliths.  Visualized osseous structures are within normal limits.  IMPRESSION: Nonobstructive bowel gas pattern.  Moderate stool in the cecum/ascending colon.   Electronically Signed   By: Charline Bills M.D.   On: 03/16/2015 14:20     EKG Interpretation None      MDM   Final diagnoses:  UTI (lower urinary tract infection)   44 year old female with history of pyelonephritis recently treated with Keflex presents today with ongoing right upper quadrant pain. Workup here reveals urinalysis with many bacteria, too numerous to count white blood cells and too numerous to count red blood cells. Recent urine culture was positive for Escherichia coli. Patient will be treated with Cipro. She has tolerated fluids well here in the emergency department and has not had any vomiting. I personally performed the services described in this documentation, which was scribed in my presence. The recorded information has been reviewed and  considered.     Margarita Grizzle, MD 03/16/15 [phone]

## 2015-03-16 NOTE — ED Notes (Addendum)
2 day history of nausea, vomiting. RUQ pain and diarrhea began 1 day ago.  Pain is sharp, stabbing and radiating to back.  Reports increase bloating as well.  Has not been able to eat much d/t n/v.

## 2015-03-23 ENCOUNTER — Emergency Department (HOSPITAL_COMMUNITY)
Admission: EM | Admit: 2015-03-23 | Discharge: 2015-03-23 | Disposition: A | Discharge status: Home / Self Care | Payer: Medicare Other | Attending: Emergency Medicine | Admitting: Emergency Medicine

## 2015-03-23 ENCOUNTER — Encounter (HOSPITAL_COMMUNITY): Payer: Self-pay | Admitting: Emergency Medicine

## 2015-03-23 DIAGNOSIS — M069 Rheumatoid arthritis, unspecified: Secondary | ICD-10-CM | POA: Insufficient documentation

## 2015-03-23 DIAGNOSIS — R1010 Upper abdominal pain, unspecified: Secondary | ICD-10-CM

## 2015-03-23 DIAGNOSIS — Z9889 Other specified postprocedural states: Secondary | ICD-10-CM | POA: Insufficient documentation

## 2015-03-23 DIAGNOSIS — N390 Urinary tract infection, site not specified: Secondary | ICD-10-CM

## 2015-03-23 DIAGNOSIS — Z9851 Tubal ligation status: Secondary | ICD-10-CM | POA: Insufficient documentation

## 2015-03-23 DIAGNOSIS — E119 Type 2 diabetes mellitus without complications: Secondary | ICD-10-CM | POA: Diagnosis not present

## 2015-03-23 DIAGNOSIS — Z880 Allergy status to penicillin: Secondary | ICD-10-CM | POA: Insufficient documentation

## 2015-03-23 DIAGNOSIS — R1012 Left upper quadrant pain: Secondary | ICD-10-CM | POA: Diagnosis present

## 2015-03-23 DIAGNOSIS — R197 Diarrhea, unspecified: Secondary | ICD-10-CM | POA: Diagnosis not present

## 2015-03-23 DIAGNOSIS — I10 Essential (primary) hypertension: Secondary | ICD-10-CM | POA: Diagnosis not present

## 2015-03-23 DIAGNOSIS — Z3202 Encounter for pregnancy test, result negative: Secondary | ICD-10-CM | POA: Diagnosis not present

## 2015-03-23 DIAGNOSIS — G8929 Other chronic pain: Secondary | ICD-10-CM | POA: Diagnosis not present

## 2015-03-23 DIAGNOSIS — Z79899 Other long term (current) drug therapy: Secondary | ICD-10-CM | POA: Diagnosis not present

## 2015-03-23 LAB — URINE MICROSCOPIC-ADD ON

## 2015-03-23 LAB — COMPREHENSIVE METABOLIC PANEL
ALK PHOS: 30 U/L — AB (ref 38–126)
ALT: 17 U/L (ref 14–54)
AST: 20 U/L (ref 15–41)
Albumin: 4.1 g/dL (ref 3.5–5.0)
Anion gap: 9 (ref 5–15)
BILIRUBIN TOTAL: 0.7 mg/dL (ref 0.3–1.2)
BUN: 9 mg/dL (ref 6–20)
CO2: 25 mmol/L (ref 22–32)
CREATININE: 0.66 mg/dL (ref 0.44–1.00)
Calcium: 8.5 mg/dL — ABNORMAL LOW (ref 8.9–10.3)
Chloride: 103 mmol/L (ref 101–111)
GFR calc Af Amer: >60 mL/min (ref >60)
GFR calc non Af Amer: >60 mL/min (ref >60)
GLUCOSE: 98 mg/dL (ref 65–99)
Potassium: 3.5 mmol/L (ref 3.5–5.1)
Sodium: 137 mmol/L (ref 135–145)
Total Protein: 7.5 g/dL (ref 6.5–8.1)

## 2015-03-23 LAB — URINALYSIS, ROUTINE W REFLEX MICROSCOPIC
Bilirubin Urine: NEGATIVE
Glucose, UA: NEGATIVE mg/dL
Ketones, ur: NEGATIVE mg/dL
Nitrite: POSITIVE — AB
PROTEIN: NEGATIVE mg/dL
SPECIFIC GRAVITY, URINE: 1.025 (ref 1.005–1.030)
Urobilinogen, UA: 0.2 mg/dL (ref 0.0–1.0)
pH: 6 (ref 5.0–8.0)

## 2015-03-23 LAB — CBC WITH DIFFERENTIAL/PLATELET
BASOS PCT: 0 % (ref 0–1)
Basophils Absolute: 0 10*3/uL (ref 0.0–0.1)
Eosinophils Absolute: 0.2 10*3/uL (ref 0.0–0.7)
Eosinophils Relative: 3 % (ref 0–5)
HCT: 38.2 % (ref 36.0–46.0)
HEMOGLOBIN: 13.2 g/dL (ref 12.0–15.0)
Lymphocytes Relative: 41 % (ref 12–46)
Lymphs Abs: 3 10*3/uL (ref 0.7–4.0)
MCH: 30 pg (ref 26.0–34.0)
MCHC: 34.6 g/dL (ref 30.0–36.0)
MCV: 86.8 fL (ref 78.0–100.0)
MONOS PCT: 4 % (ref 3–12)
Monocytes Absolute: 0.3 10*3/uL (ref 0.1–1.0)
NEUTROS PCT: 52 % (ref 43–77)
Neutro Abs: 3.7 10*3/uL (ref 1.7–7.7)
Platelets: 318 10*3/uL (ref 150–400)
RBC: 4.4 MIL/uL (ref 3.87–5.11)
RDW: 12.9 % (ref 11.5–15.5)
WBC: 7.2 10*3/uL (ref 4.0–10.5)

## 2015-03-23 LAB — LIPASE, BLOOD: LIPASE: 42 U/L (ref 22–51)

## 2015-03-23 LAB — PREGNANCY, URINE: Preg Test, Ur: NEGATIVE

## 2015-03-23 MED ORDER — OXYCODONE-ACETAMINOPHEN 5-325 MG PO TABS: 1.0000 | ORAL_TABLET | Freq: Four times a day (QID) | ORAL | Status: DC | PRN

## 2015-03-23 MED ORDER — ONDANSETRON HCL 4 MG/2ML IJ SOLN
4.0000 mg | Freq: Once | INTRAMUSCULAR | Status: AC
  Administered 2015-03-23: 4 mg via INTRAVENOUS
  Filled 2015-03-23: qty 2

## 2015-03-23 MED ORDER — CIPROFLOXACIN IN D5W 400 MG/200ML IV SOLN
400.0000 mg | Freq: Once | INTRAVENOUS | Status: AC
  Administered 2015-03-23: 400 mg via INTRAVENOUS
  Filled 2015-03-23: qty 200

## 2015-03-23 MED ORDER — PROMETHAZINE HCL 25 MG PO TABS: 25.0000 mg | ORAL_TABLET | Freq: Four times a day (QID) | ORAL | Status: DC | PRN

## 2015-03-23 MED ORDER — MORPHINE SULFATE 4 MG/ML IJ SOLN
4.0000 mg | Freq: Once | INTRAMUSCULAR | Status: AC
  Administered 2015-03-23: 4 mg via INTRAVENOUS
  Filled 2015-03-23: qty 1

## 2015-03-23 MED ORDER — NITROFURANTOIN MONOHYD MACRO 100 MG PO CAPS: 100.0000 mg | ORAL_CAPSULE | Freq: Two times a day (BID) | ORAL | Status: DC

## 2015-03-23 MED ORDER — SODIUM CHLORIDE 0.9 % IV BOLUS (SEPSIS)
1000.0000 mL | Freq: Once | INTRAVENOUS | Status: AC
  Administered 2015-03-23: 1000 mL via INTRAVENOUS

## 2015-03-23 NOTE — ED Notes (Signed)
Pt made aware a urine specimen is needed. Pt verbalized understanding.

## 2015-03-23 NOTE — ED Provider Notes (Signed)
CSN: [id number]     Arrival date & time 03/23/15  1752 History   First MD Initiated Contact with Patient 03/23/15 1806     Chief Complaint  Patient presents with  . Abdominal Pain     (Consider location/radiation/quality/duration/timing/severity/associated sxs/prior Treatment) HPI..... Stabbing epigastric and left upper quadrant pain with associated diarrhea since yesterday a.m. Patient has a history of rheumatoid arthritis, and diabetes. She has had a bladder tacking procedure 2. She was seen last weekend here for a urinary tract infection. No fever, sweats, chills, dysuria, hematuria.  Past Medical History  Diagnosis Date  . Hypertension   . Chronic back pain   . Sciatic pain     right  . Rib pain on right side     chronic  . Chronic neck pain   . Cervical radiculopathy   . Chronic knee pain   . Rheumatoid arthritis     "Rhematoid factor positive but no symptoms" per EPIC notes 2012  . Fibromyalgia   . Diabetes mellitus without complication    Past Surgical History  Procedure Laterality Date  . Tubal ligation    . Bladder surgery     No family history on file. History  Substance Use Topics  . Smoking status: Never Smoker   . Smokeless tobacco: Not on file  . Alcohol Use: No   OB History    Gravida Para Term Preterm AB TAB SAB Ectopic Multiple Living            3     Review of Systems  All other systems reviewed and are negative.     Allergies  Mango flavor; Penicillins; Tramadol; and Toradol  Home Medications   Prior to Admission medications   Medication Sig Start Date End Date Taking? Authorizing Provider  lisinopril-hydrochlorothiazide (PRINZIDE,ZESTORETIC) 20-25 MG per tablet Take 2 tablets by mouth every morning.    Yes Historical Provider, MD  venlafaxine XR (EFFEXOR-XR) 75 MG 24 hr capsule Take 225 mg by mouth at bedtime.    Yes Historical Provider, MD  acetaminophen (TYLENOL) 500 MG tablet Take 1,000 mg by mouth every 6 (six) hours as needed.     Historical Provider, MD  cephALEXin (KEFLEX) 500 MG capsule Take 1 capsule (500 mg total) by mouth 4 (four) times daily. Patient not taking: Reported on 03/16/2015 02/23/15   Mancel Bale, MD  ciprofloxacin (CIPRO) 500 MG tablet Take 1 tablet (500 mg total) by mouth every 12 (twelve) hours. 03/16/15   Margarita Grizzle, MD  diclofenac (VOLTAREN) 75 MG EC tablet Take 1 tablet (75 mg total) by mouth 2 (two) times daily. Patient not taking: Reported on 03/16/2015 02/18/15   Ivery Quale, PA-C  methocarbamol (ROBAXIN) 500 MG tablet Take 1 tablet (500 mg total) by mouth 3 (three) times daily. Patient not taking: Reported on 03/16/2015 02/18/15   Ivery Quale, PA-C  nitrofurantoin, macrocrystal-monohydrate, (MACROBID) 100 MG capsule Take 1 capsule (100 mg total) by mouth 2 (two) times daily. 03/23/15   Donnetta Hutching, MD  ondansetron (ZOFRAN ODT) 8 MG disintegrating tablet Take 1 tablet (8 mg total) by mouth every 8 (eight) hours as needed for nausea or vomiting. 03/16/15   Margarita Grizzle, MD  oxyCODONE-acetaminophen (PERCOCET/ROXICET) 5-325 MG per tablet Take 1-2 tablets by mouth every 6 (six) hours as needed. 03/23/15   Donnetta Hutching, MD  promethazine (PHENERGAN) 25 MG tablet Take 1 tablet (25 mg total) by mouth every 6 (six) hours as needed. 03/23/15   Donnetta Hutching, MD   BP 122/62  mmHg  Pulse 81  Temp(Src) 99.2 F (37.3 C) (Oral)  Resp 18  Ht 5' (1.524 m)  Wt 198 lb (89.812 kg)  BMI 38.67 kg/m2  SpO2 99%  LMP 03/18/2015 Physical Exam  Constitutional: She is oriented to person, place, and time. She appears well-developed and well-nourished.  HENT:  Head: Normocephalic and atraumatic.  Eyes: Conjunctivae and EOM are normal. Pupils are equal, round, and reactive to light.  Neck: Normal range of motion. Neck supple.  Cardiovascular: Normal rate and regular rhythm.   Pulmonary/Chest: Effort normal and breath sounds normal.  Abdominal: Soft. Bowel sounds are normal.  Minimal epigastric and left upper quadrant tenderness   Musculoskeletal: Normal range of motion.  Neurological: She is alert and oriented to person, place, and time.  Skin: Skin is warm and dry.  Psychiatric: She has a normal mood and affect. Her behavior is normal.  Nursing note and vitals reviewed.   ED Course  Procedures (including critical care time) Labs Review Labs Reviewed  COMPREHENSIVE METABOLIC PANEL - Abnormal; Notable for the following:    Calcium 8.5 (*)    Alkaline Phosphatase 30 (*)    All other components within normal limits  URINALYSIS, ROUTINE W REFLEX MICROSCOPIC (NOT AT [facility]) - Abnormal; Notable for the following:    APPearance HAZY (*)    Hgb urine dipstick LARGE (*)    Nitrite POSITIVE (*)    Leukocytes, UA MODERATE (*)    All other components within normal limits  URINE MICROSCOPIC-ADD ON - Abnormal; Notable for the following:    Squamous Epithelial / LPF FEW (*)    Bacteria, UA MANY (*)    All other components within normal limits  URINE CULTURE  CBC WITH DIFFERENTIAL/PLATELET  LIPASE, BLOOD  PREGNANCY, URINE    Imaging Review No results found.   EKG Interpretation None      MDM   Final diagnoses:  Pain of upper abdomen  UTI (lower urinary tract infection)    No acute abdomen. White count is normal. Urinalysis still shows evidence of infection. Will change antibiotic to Macrobid. Discharge medications Percocet and Phenergan 25 mg. Patient strongly encouraged to follow-up with urologist in [facility] phone # for [facility] in [place]  Given]    Donnetta Hutching, MD 03/23/15 2203

## 2015-03-23 NOTE — ED Notes (Signed)
MD Cook at bedside. 

## 2015-03-23 NOTE — Discharge Instructions (Signed)
Follow-up [facility] in [place] at [phone].   Medication for pain, nausea, antibiotic.

## 2015-03-23 NOTE — ED Notes (Signed)
Pt c/o upper abd pain with n/d since yesterday. denies vomiting.

## 2015-03-26 LAB — URINE CULTURE

## 2015-03-27 ENCOUNTER — Telehealth: Payer: Self-pay | Admitting: Emergency Medicine

## 2015-03-27 NOTE — Telephone Encounter (Signed)
Post ED Visit - Positive Culture Follow-up  Culture report reviewed by antimicrobial stewardship pharmacist: []  Wes Dulaney, Pharm.D., BCPS []  , Pharm.D., BCPS []  , Celedonio Miyamoto.D., BCPS []  [place], Georgina Pillion.D., BCPS, AAHIVP []  [address], Pharm.D., BCPS, AAHIVP []  , Pharm.D., BCPS  PositiveUrine culture Treated with Nitrofurantoin, organism sensitive to the same and no further patient follow-up is required at this time.  [place] 03/27/2015, 9:28 AM

## 2015-03-31 ENCOUNTER — Emergency Department (HOSPITAL_COMMUNITY)
Admission: EM | Admit: 2015-03-31 | Discharge: 2015-03-31 | Disposition: A | Discharge status: Home / Self Care | Payer: Medicare Other | Attending: Emergency Medicine | Admitting: Emergency Medicine

## 2015-03-31 ENCOUNTER — Encounter (HOSPITAL_COMMUNITY): Payer: Self-pay | Admitting: *Deleted

## 2015-03-31 ENCOUNTER — Emergency Department (HOSPITAL_COMMUNITY): Payer: Medicare Other

## 2015-03-31 DIAGNOSIS — R109 Unspecified abdominal pain: Secondary | ICD-10-CM

## 2015-03-31 DIAGNOSIS — G8929 Other chronic pain: Secondary | ICD-10-CM | POA: Diagnosis not present

## 2015-03-31 DIAGNOSIS — N390 Urinary tract infection, site not specified: Secondary | ICD-10-CM

## 2015-03-31 DIAGNOSIS — Z880 Allergy status to penicillin: Secondary | ICD-10-CM | POA: Diagnosis not present

## 2015-03-31 DIAGNOSIS — I10 Essential (primary) hypertension: Secondary | ICD-10-CM | POA: Diagnosis not present

## 2015-03-31 DIAGNOSIS — M069 Rheumatoid arthritis, unspecified: Secondary | ICD-10-CM | POA: Diagnosis not present

## 2015-03-31 DIAGNOSIS — E119 Type 2 diabetes mellitus without complications: Secondary | ICD-10-CM | POA: Diagnosis not present

## 2015-03-31 DIAGNOSIS — Z79899 Other long term (current) drug therapy: Secondary | ICD-10-CM | POA: Diagnosis not present

## 2015-03-31 LAB — COMPREHENSIVE METABOLIC PANEL
ALBUMIN: 3.9 g/dL (ref 3.5–5.0)
ALT: 16 U/L (ref 14–54)
AST: 17 U/L (ref 15–41)
Alkaline Phosphatase: 32 U/L — ABNORMAL LOW (ref 38–126)
Anion gap: 9 (ref 5–15)
BUN: 10 mg/dL (ref 6–20)
CALCIUM: 9 mg/dL (ref 8.9–10.3)
CO2: 27 mmol/L (ref 22–32)
CREATININE: 0.72 mg/dL (ref 0.44–1.00)
Chloride: 102 mmol/L (ref 101–111)
GFR calc non Af Amer: >60 mL/min (ref >60)
Glucose, Bld: 97 mg/dL (ref 65–99)
Potassium: 3.9 mmol/L (ref 3.5–5.1)
SODIUM: 138 mmol/L (ref 135–145)
Total Bilirubin: 0.5 mg/dL (ref 0.3–1.2)
Total Protein: 7.3 g/dL (ref 6.5–8.1)

## 2015-03-31 LAB — URINALYSIS, ROUTINE W REFLEX MICROSCOPIC
Bilirubin Urine: NEGATIVE
GLUCOSE, UA: NEGATIVE mg/dL
KETONES UR: NEGATIVE mg/dL
Nitrite: NEGATIVE
Protein, ur: NEGATIVE mg/dL
Specific Gravity, Urine: 1.025 (ref 1.005–1.030)
UROBILINOGEN UA: 1 mg/dL (ref 0.0–1.0)
pH: 6.5 (ref 5.0–8.0)

## 2015-03-31 LAB — CBC WITH DIFFERENTIAL/PLATELET
Basophils Absolute: 0 10*3/uL (ref 0.0–0.1)
Basophils Relative: 0 % (ref 0–1)
Eosinophils Absolute: 0.2 10*3/uL (ref 0.0–0.7)
Eosinophils Relative: 2 % (ref 0–5)
HEMATOCRIT: 38.4 % (ref 36.0–46.0)
HEMOGLOBIN: 13.1 g/dL (ref 12.0–15.0)
LYMPHS PCT: 34 % (ref 12–46)
Lymphs Abs: 3 10*3/uL (ref 0.7–4.0)
MCH: 30 pg (ref 26.0–34.0)
MCHC: 34.1 g/dL (ref 30.0–36.0)
MCV: 88.1 fL (ref 78.0–100.0)
MONO ABS: 0.4 10*3/uL (ref 0.1–1.0)
Monocytes Relative: 5 % (ref 3–12)
NEUTROS ABS: 5.3 10*3/uL (ref 1.7–7.7)
NEUTROS PCT: 59 % (ref 43–77)
Platelets: 304 10*3/uL (ref 150–400)
RBC: 4.36 MIL/uL (ref 3.87–5.11)
RDW: 13 % (ref 11.5–15.5)
WBC: 8.9 10*3/uL (ref 4.0–10.5)

## 2015-03-31 LAB — URINE MICROSCOPIC-ADD ON

## 2015-03-31 MED ORDER — SULFAMETHOXAZOLE-TRIMETHOPRIM 800-160 MG PO TABS
1.0000 | ORAL_TABLET | Freq: Once | ORAL | Status: AC
  Administered 2015-03-31: 1 via ORAL
  Filled 2015-03-31: qty 1

## 2015-03-31 MED ORDER — HYDROCODONE-ACETAMINOPHEN 5-325 MG PO TABS: 2.0000 | ORAL_TABLET | ORAL | Status: DC | PRN

## 2015-03-31 MED ORDER — CEPHALEXIN 500 MG PO CAPS: 500.0000 mg | ORAL_CAPSULE | Freq: Four times a day (QID) | ORAL | Status: DC

## 2015-03-31 MED ORDER — IOHEXOL 300 MG/ML  SOLN
100.0000 mL | Freq: Once | INTRAMUSCULAR | Status: AC | PRN
  Administered 2015-03-31: 100 mL via INTRAVENOUS

## 2015-03-31 MED ORDER — MORPHINE SULFATE 4 MG/ML IJ SOLN
2.0000 mg | Freq: Once | INTRAMUSCULAR | Status: AC
  Administered 2015-03-31: 2 mg via INTRAVENOUS
  Filled 2015-03-31: qty 1

## 2015-03-31 MED ORDER — SODIUM CHLORIDE 0.9 % IV BOLUS (SEPSIS)
1000.0000 mL | Freq: Once | INTRAVENOUS | Status: AC
  Administered 2015-03-31: 1000 mL via INTRAVENOUS

## 2015-03-31 MED ORDER — ONDANSETRON HCL 4 MG/2ML IJ SOLN
4.0000 mg | Freq: Once | INTRAMUSCULAR | Status: AC
  Administered 2015-03-31: 4 mg via INTRAVENOUS
  Filled 2015-03-31: qty 2

## 2015-03-31 NOTE — Discharge Instructions (Signed)
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  Please obtain all of your results from medical records or have your doctors office obtain the results - share them with your doctor - you should be seen at your doctors office in the next 2 days. Call today to arrange your follow up. Take the medications as prescribed. Please review all of the medicines and only take them if you do not have an allergy to them. Please be aware that if you are taking birth control pills, taking other prescriptions, ESPECIALLY ANTIBIOTICS may make the birth control ineffective - if this is the case, either do not engage in sexual activity or use alternative methods of birth control such as condoms until you have finished the medicine and your family doctor says it is OK to restart them. If you are on a blood thinner such as COUMADIN, be aware that any other medicine that you take may cause the coumadin to either work too much, or not enough - you should have your coumadin level rechecked in next 7 days if this is the case.  ?  It is also a possibility that you have an allergic reaction to any of the medicines that you have been prescribed - Everybody reacts differently to medications and while MOST people have no trouble with most medicines, you may have a reaction such as nausea, vomiting, rash, swelling, shortness of breath. If this is the case, please stop taking the medicine immediately and contact your physician.  ?  You should return to the ER if you develop severe or worsening symptoms.

## 2015-03-31 NOTE — ED Provider Notes (Signed)
CSN: [id number]     Arrival date & time 03/31/15  1445 History   First MD Initiated Contact with Patient 03/31/15 1619     Chief Complaint  Patient presents with  . Flank Pain     (Consider location/radiation/quality/duration/timing/severity/associated sxs/prior Treatment) HPI Comments: The patient is a 44 year old female, she has a history of multiple urinary tract infections this month, according to the medical record the patient was initially seen on July 9, treated with ciprofloxacin, had no improvement in her symptoms and came back 1 week later. A urinalysis revealed ongoing infection, a culture was obtained, she was started on Macrobid. Culture and sensitivity reports found Escherichia coli, greater than 100,000 colonies, sensitive to Macrobid. She reports she initially improved with the medications but over the last 2 days has had a return of her symptoms including severe flank pain on the right, less flank pain on the left, suprapubic discomfort with dysuria, urinary frequency and nausea. She denies fevers chills vomiting diarrhea swelling rashes headache cough or sore throat. She states that she took the full 7 days of Macrobid.  Patient is a 44 y.o. female presenting with flank pain. The history is provided by the patient.  Flank Pain    Past Medical History  Diagnosis Date  . Hypertension   . Chronic back pain   . Sciatic pain     right  . Rib pain on right side     chronic  . Chronic neck pain   . Cervical radiculopathy   . Chronic knee pain   . Rheumatoid arthritis     "Rhematoid factor positive but no symptoms" per EPIC notes 2012  . Fibromyalgia   . Diabetes mellitus without complication    Past Surgical History  Procedure Laterality Date  . Tubal ligation    . Bladder surgery     No family history on file. History  Substance Use Topics  . Smoking status: Never Smoker   . Smokeless tobacco: Not on file  . Alcohol Use: No   OB History    Gravida Para Term  Preterm AB TAB SAB Ectopic Multiple Living            3     Review of Systems  Genitourinary: Positive for flank pain.  All other systems reviewed and are negative.     Allergies  Mango flavor; Penicillins; Tramadol; and Toradol  Home Medications   Prior to Admission medications   Medication Sig Start Date End Date Taking? Authorizing Provider  lisinopril-hydrochlorothiazide (PRINZIDE,ZESTORETIC) 20-25 MG per tablet Take 2 tablets by mouth every morning.    Yes Historical Provider, MD  venlafaxine XR (EFFEXOR-XR) 75 MG 24 hr capsule Take 225 mg by mouth at bedtime.    Yes Historical Provider, MD  acetaminophen (TYLENOL) 500 MG tablet Take 1,000 mg by mouth every 6 (six) hours as needed for mild pain.     Historical Provider, MD  cephALEXin (KEFLEX) 500 MG capsule Take 1 capsule (500 mg total) by mouth 4 (four) times daily. 03/31/15   Eber Hong, MD  ciprofloxacin (CIPRO) 500 MG tablet Take 1 tablet (500 mg total) by mouth every 12 (twelve) hours. Patient not taking: Reported on 03/31/2015 03/16/15   Margarita Grizzle, MD  diclofenac (VOLTAREN) 75 MG EC tablet Take 1 tablet (75 mg total) by mouth 2 (two) times daily. Patient not taking: Reported on 03/16/2015 02/18/15   Ivery Quale, PA-C  HYDROcodone-acetaminophen (NORCO/VICODIN) 5-325 MG per tablet Take 2 tablets by mouth every 4 (four)  hours as needed. 03/31/15   Eber Hong, MD  HYDROcodone-acetaminophen (NORCO/VICODIN) 5-325 MG per tablet Take 2 tablets by mouth every 4 (four) hours as needed for moderate pain. 03/31/15   Eber Hong, MD  methocarbamol (ROBAXIN) 500 MG tablet Take 1 tablet (500 mg total) by mouth 3 (three) times daily. Patient not taking: Reported on 03/16/2015 02/18/15   Ivery Quale, PA-C  nitrofurantoin, macrocrystal-monohydrate, (MACROBID) 100 MG capsule Take 1 capsule (100 mg total) by mouth 2 (two) times daily. Patient not taking: Reported on 03/31/2015 03/23/15   Donnetta Hutching, MD  ondansetron [facility] ODT) 8 MG  disintegrating tablet Take 1 tablet (8 mg total) by mouth every 8 (eight) hours as needed for nausea or vomiting. Patient not taking: Reported on 03/31/2015 03/16/15   Margarita Grizzle, MD  oxyCODONE-acetaminophen (PERCOCET/ROXICET) 5-325 MG per tablet Take 1-2 tablets by mouth every 6 (six) hours as needed. Patient not taking: Reported on 03/31/2015 03/23/15   Donnetta Hutching, MD  promethazine (PHENERGAN) 25 MG tablet Take 1 tablet (25 mg total) by mouth every 6 (six) hours as needed. Patient not taking: Reported on 03/31/2015 03/23/15   Donnetta Hutching, MD   BP 119/62 mmHg  Pulse 89  Temp(Src) 98.6 F (37 C) (Oral)  Resp 18  Ht 5' (1.524 m)  Wt 190 lb (86.183 kg)  BMI 37.11 kg/m2  SpO2 99%  LMP 03/18/2015 Physical Exam  Constitutional: She appears well-developed and well-nourished. No distress.  HENT:  Head: Normocephalic and atraumatic.  Mouth/Throat: Oropharynx is clear and moist. No oropharyngeal exudate.  Eyes: Conjunctivae and EOM are normal. Pupils are equal, round, and reactive to light. Right eye exhibits no discharge. Left eye exhibits no discharge. No scleral icterus.  Neck: Normal range of motion. Neck supple. No JVD present. No thyromegaly present.  Cardiovascular: Normal rate, regular rhythm, normal heart sounds and intact distal pulses.  Exam reveals no gallop and no friction rub.   No murmur heard. Pulmonary/Chest: Effort normal and breath sounds normal. No respiratory distress. She has no wheezes. She has no rales.  Abdominal: Soft. Bowel sounds are normal. She exhibits no distension and no mass. There is tenderness (suprapubic discomfort with palpation, no guarding, right CVA tenderness).  Musculoskeletal: Normal range of motion. She exhibits no edema or tenderness.  Lymphadenopathy:    She has no cervical adenopathy.  Neurological: She is alert. Coordination normal.  Skin: Skin is warm and dry. No rash noted. No erythema.  Psychiatric: She has a normal mood and affect. Her behavior is  normal.  Nursing note and vitals reviewed.   ED Course  Procedures (including critical care time) Labs Review Labs Reviewed  URINALYSIS, ROUTINE W REFLEX MICROSCOPIC (NOT AT [facility]) - Abnormal; Notable for the following:    APPearance CLOUDY (*)    Hgb urine dipstick SMALL (*)    Leukocytes, UA MODERATE (*)    All other components within normal limits  URINE MICROSCOPIC-ADD ON - Abnormal; Notable for the following:    Squamous Epithelial / LPF FEW (*)    Bacteria, UA MANY (*)    All other components within normal limits  COMPREHENSIVE METABOLIC PANEL - Abnormal; Notable for the following:    Alkaline Phosphatase 32 (*)    All other components within normal limits  CBC WITH DIFFERENTIAL/PLATELET    Imaging Review Ct Abdomen Pelvis W Contrast  03/31/2015   CLINICAL DATA:  Bilateral flank pain and dysuria for 1 week  EXAM: CT ABDOMEN AND PELVIS WITH CONTRAST  TECHNIQUE: Multidetector CT  imaging of the abdomen and pelvis was performed using the standard protocol following bolus administration of intravenous contrast.  CONTRAST:  OMNIPAQUE IOHEXOL 300 MG/ML  SOLN  COMPARISON:  February 23, 2015  FINDINGS: Lung bases are clear.  No focal liver lesions are identified. The gallbladder wall is not appreciably thickened. There is no biliary duct dilatation.  Spleen, pancreas, and adrenals appear normal.  There is a cyst in the posterior lower pole left kidney measuring 1.5 x 1.5 cm. There is no other renal mass. There is no inflammatory focus in either kidney. There is no hydronephrosis. There is an extrarenal pelvis on each side, an anatomic variant. There is no renal or ureteral calculus on either side.  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 They are best seen on axial slices 91, 92, and 93 series 2 as well as on coronal slice is 46, 47, and 48 series 3. Urinary bladder is midline with normal wall thickness. There is no pelvic mass or pelvic fluid  collection. Appendix appears normal.  There is no bowel obstruction. No free air or portal venous air. There is no demonstrable ascites, adenopathy, or abscess in the abdomen or pelvis. Aorta shows no evidence of aneurysm. There are no blastic or lytic bone lesions.  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 Infection within these diverticula cannot be excluded.  There is no evidence of inflammation in either kidney. No hydronephrosis on either side. No renal or ureteral calculi.  No bowel obstruction.  No abscess.  Appendix appears normal.   Electronically Signed   By: Bretta Bang III M.D.   On: 03/31/2015 18:27      MDM   Final diagnoses:  Flank pain  UTI (lower urinary tract infection)    There is no fever or tachycardia but the simple recurrence of her symptoms so quickly questions the etiology of the urinary tract infection and is suggestive of an upper tract infection such as pyelonephritis or renal abscess, possibly kidney stone though less likely given the lack of colic symptoms. Vital signs remain unremarkable, CT scan ordered to rule out upper urinary tract abnormality, the patient is in agreement with the plan including IV fluids and medications. Because of severe penicillin allergies Cephalosporins will be avoided, prior culture sent that Bactrim was sensitive, we'll proceed with this.  The patient now states that she is able to take syphilis ports without difficulty. Because of her most recent results by culture and sensitivity I will add this. We'll add another urine culture to make sure that it is the same infection, CT scan shows bilateral urethral diverticula, she has been informed of this result, she has been given a copy of her CT scan and has encouraged her to follow up with urology. She is in total agreement and understanding with the process.  Meds given in ED:  Medications  morphine 4 MG/ML injection 2 mg (2 mg Intravenous Given 03/31/15 1710)   sodium chloride 0.9 % bolus 1,000 mL (0 mLs Intravenous Stopped 03/31/15 1829)  ondansetron (ZOFRAN) injection 4 mg (4 mg Intravenous Given 03/31/15 1710)  sulfamethoxazole-trimethoprim (BACTRIM DS,SEPTRA DS) 800-160 MG per tablet 1 tablet (1 tablet Oral Given 03/31/15 1710)  iohexol (OMNIPAQUE) 300 MG/ML solution 100 mL (100 mLs Intravenous Contrast Given 03/31/15 1748)  morphine 4 MG/ML injection 2 mg (2 mg Intravenous Given 03/31/15 1837)    New Prescriptions   CEPHALEXIN (KEFLEX) 500 MG CAPSULE    Take 1  capsule (500 mg total) by mouth 4 (four) times daily.   HYDROCODONE-ACETAMINOPHEN (NORCO/VICODIN) 5-325 MG PER TABLET    Take 2 tablets by mouth every 4 (four) hours as needed.   HYDROCODONE-ACETAMINOPHEN (NORCO/VICODIN) 5-325 MG PER TABLET    Take 2 tablets by mouth every 4 (four) hours as needed for moderate pain.        Eber Hong, MD 03/31/15 [phone]

## 2015-03-31 NOTE — ED Notes (Signed)
Pt c/o bilateral flank pain, mainly right sided and trouble urinating x 1 week. Pt says she came in last week and was told she had a UTI and was given antibiotics which are now completed. Extreme pain and burning with urination. Pt denies fever. Pt reports nausea that started last night, no vomiting.

## 2015-04-03 LAB — URINE CULTURE

## 2015-04-05 ENCOUNTER — Encounter (HOSPITAL_COMMUNITY): Payer: Self-pay | Admitting: *Deleted

## 2015-04-05 ENCOUNTER — Observation Stay (HOSPITAL_COMMUNITY)
Admission: EM | Admit: 2015-04-05 | Discharge: 2015-04-07 | Disposition: A | Discharge status: Home / Self Care | Payer: Medicare Other | Attending: Internal Medicine | Admitting: Internal Medicine

## 2015-04-05 DIAGNOSIS — E119 Type 2 diabetes mellitus without complications: Secondary | ICD-10-CM | POA: Diagnosis not present

## 2015-04-05 DIAGNOSIS — M069 Rheumatoid arthritis, unspecified: Secondary | ICD-10-CM | POA: Diagnosis not present

## 2015-04-05 DIAGNOSIS — G8929 Other chronic pain: Secondary | ICD-10-CM | POA: Diagnosis not present

## 2015-04-05 DIAGNOSIS — Z79899 Other long term (current) drug therapy: Secondary | ICD-10-CM | POA: Insufficient documentation

## 2015-04-05 DIAGNOSIS — M797 Fibromyalgia: Secondary | ICD-10-CM | POA: Insufficient documentation

## 2015-04-05 DIAGNOSIS — Z792 Long term (current) use of antibiotics: Secondary | ICD-10-CM | POA: Diagnosis not present

## 2015-04-05 DIAGNOSIS — Z880 Allergy status to penicillin: Secondary | ICD-10-CM | POA: Diagnosis not present

## 2015-04-05 DIAGNOSIS — G894 Chronic pain syndrome: Secondary | ICD-10-CM | POA: Diagnosis not present

## 2015-04-05 DIAGNOSIS — M5430 Sciatica, unspecified side: Secondary | ICD-10-CM | POA: Diagnosis not present

## 2015-04-05 DIAGNOSIS — I10 Essential (primary) hypertension: Secondary | ICD-10-CM | POA: Diagnosis not present

## 2015-04-05 DIAGNOSIS — R109 Unspecified abdominal pain: Secondary | ICD-10-CM | POA: Diagnosis present

## 2015-04-05 DIAGNOSIS — N12 Tubulo-interstitial nephritis, not specified as acute or chronic: Principal | ICD-10-CM | POA: Diagnosis present

## 2015-04-05 LAB — URINE MICROSCOPIC-ADD ON

## 2015-04-05 LAB — URINALYSIS, ROUTINE W REFLEX MICROSCOPIC
Bilirubin Urine: NEGATIVE
GLUCOSE, UA: NEGATIVE mg/dL
Ketones, ur: NEGATIVE mg/dL
NITRITE: NEGATIVE
PH: 5.5 (ref 5.0–8.0)
Protein, ur: NEGATIVE mg/dL
Specific Gravity, Urine: >1.03 — ABNORMAL HIGH (ref 1.005–1.030)
UROBILINOGEN UA: 0.2 mg/dL (ref 0.0–1.0)

## 2015-04-05 LAB — CBC WITH DIFFERENTIAL/PLATELET
Basophils Absolute: 0 10*3/uL (ref 0.0–0.1)
Basophils Relative: 0 % (ref 0–1)
EOS ABS: 0.2 10*3/uL (ref 0.0–0.7)
EOS PCT: 2 % (ref 0–5)
HEMATOCRIT: 38.8 % (ref 36.0–46.0)
Hemoglobin: 13.2 g/dL (ref 12.0–15.0)
Lymphocytes Relative: 36 % (ref 12–46)
Lymphs Abs: 2.9 10*3/uL (ref 0.7–4.0)
MCH: 29.9 pg (ref 26.0–34.0)
MCHC: 34 g/dL (ref 30.0–36.0)
MCV: 88 fL (ref 78.0–100.0)
Monocytes Absolute: 0.3 10*3/uL (ref 0.1–1.0)
Monocytes Relative: 4 % (ref 3–12)
NEUTROS ABS: 4.5 10*3/uL (ref 1.7–7.7)
NEUTROS PCT: 58 % (ref 43–77)
Platelets: 277 10*3/uL (ref 150–400)
RBC: 4.41 MIL/uL (ref 3.87–5.11)
RDW: 13.1 % (ref 11.5–15.5)
WBC: 7.9 10*3/uL (ref 4.0–10.5)

## 2015-04-05 LAB — COMPREHENSIVE METABOLIC PANEL
ALK PHOS: 30 U/L — AB (ref 38–126)
ALT: 17 U/L (ref 14–54)
AST: 18 U/L (ref 15–41)
Albumin: 4 g/dL (ref 3.5–5.0)
Anion gap: 10 (ref 5–15)
BUN: 14 mg/dL (ref 6–20)
CO2: 24 mmol/L (ref 22–32)
Calcium: 9.1 mg/dL (ref 8.9–10.3)
Chloride: 102 mmol/L (ref 101–111)
Creatinine, Ser: 0.8 mg/dL (ref 0.44–1.00)
GFR calc Af Amer: >60 mL/min (ref >60)
GFR calc non Af Amer: >60 mL/min (ref >60)
Glucose, Bld: 104 mg/dL — ABNORMAL HIGH (ref 65–99)
POTASSIUM: 3.9 mmol/L (ref 3.5–5.1)
Sodium: 136 mmol/L (ref 135–145)
TOTAL PROTEIN: 7.5 g/dL (ref 6.5–8.1)
Total Bilirubin: 0.6 mg/dL (ref 0.3–1.2)

## 2015-04-05 LAB — LACTIC ACID, PLASMA
LACTIC ACID, VENOUS: 0.9 mmol/L (ref 0.5–2.0)
Lactic Acid, Venous: 0.7 mmol/L (ref 0.5–2.0)

## 2015-04-05 LAB — LIPASE, BLOOD: LIPASE: 34 U/L (ref 22–51)

## 2015-04-05 LAB — PREGNANCY, URINE: Preg Test, Ur: NEGATIVE

## 2015-04-05 MED ORDER — SODIUM CHLORIDE 0.9 % IV SOLN
INTRAVENOUS | Status: DC
  Administered 2015-04-05 – 2015-04-07 (×4): via INTRAVENOUS

## 2015-04-05 MED ORDER — LISINOPRIL 10 MG PO TABS
40.0000 mg | ORAL_TABLET | Freq: Every day | ORAL | Status: DC
  Administered 2015-04-05 – 2015-04-06 (×2): 40 mg via ORAL
  Filled 2015-04-05 (×3): qty 4

## 2015-04-05 MED ORDER — HYDROMORPHONE HCL 1 MG/ML IJ SOLN
1.0000 mg | INTRAMUSCULAR | Status: DC | PRN
  Administered 2015-04-05 – 2015-04-07 (×6): 1 mg via INTRAVENOUS
  Filled 2015-04-05 (×7): qty 1

## 2015-04-05 MED ORDER — CEFTRIAXONE SODIUM IN DEXTROSE 20 MG/ML IV SOLN
1.0000 g | INTRAVENOUS | Status: DC
  Filled 2015-04-05 (×3): qty 50

## 2015-04-05 MED ORDER — SODIUM CHLORIDE 0.9 % IV SOLN
INTRAVENOUS | Status: DC
  Administered 2015-04-05: 14:00:00 via INTRAVENOUS

## 2015-04-05 MED ORDER — VENLAFAXINE HCL ER 75 MG PO CP24
225.0000 mg | ORAL_CAPSULE | Freq: Every day | ORAL | Status: DC
  Administered 2015-04-05 – 2015-04-06 (×2): 225 mg via ORAL
  Filled 2015-04-05 (×2): qty 3

## 2015-04-05 MED ORDER — ONDANSETRON HCL 4 MG/2ML IJ SOLN
4.0000 mg | INTRAMUSCULAR | Status: DC | PRN
  Administered 2015-04-05: 4 mg via INTRAVENOUS
  Filled 2015-04-05: qty 2

## 2015-04-05 MED ORDER — SODIUM CHLORIDE 0.9 % IV SOLN: INTRAVENOUS | Status: AC

## 2015-04-05 MED ORDER — HYDROMORPHONE HCL 1 MG/ML IJ SOLN
1.0000 mg | INTRAMUSCULAR | Status: DC | PRN
  Administered 2015-04-05 (×2): 1 mg via INTRAVENOUS
  Filled 2015-04-05 (×2): qty 1

## 2015-04-05 MED ORDER — OXYCODONE-ACETAMINOPHEN 5-325 MG PO TABS
1.0000 | ORAL_TABLET | ORAL | Status: DC | PRN
  Administered 2015-04-05 – 2015-04-07 (×7): 1 via ORAL
  Filled 2015-04-05 (×8): qty 1

## 2015-04-05 MED ORDER — ONDANSETRON HCL 4 MG/2ML IJ SOLN: 4.0000 mg | Freq: Four times a day (QID) | INTRAMUSCULAR | Status: DC | PRN

## 2015-04-05 MED ORDER — ACETAMINOPHEN 325 MG PO TABS: 650.0000 mg | ORAL_TABLET | Freq: Four times a day (QID) | ORAL | Status: DC | PRN

## 2015-04-05 MED ORDER — ONDANSETRON HCL 4 MG/2ML IJ SOLN: 4.0000 mg | Freq: Three times a day (TID) | INTRAMUSCULAR | Status: DC | PRN

## 2015-04-05 MED ORDER — LISINOPRIL-HYDROCHLOROTHIAZIDE 20-25 MG PO TABS: 2.0000 | ORAL_TABLET | Freq: Every morning | ORAL | Status: DC

## 2015-04-05 MED ORDER — HYDROCHLOROTHIAZIDE 50 MG PO TABS
50.0000 mg | ORAL_TABLET | Freq: Every day | ORAL | Status: DC
  Administered 2015-04-05: 50 mg via ORAL
  Filled 2015-04-05 (×2): qty 1
  Filled 2015-04-05: qty 2
  Filled 2015-04-05 (×2): qty 1

## 2015-04-05 MED ORDER — ONDANSETRON HCL 4 MG PO TABS: 4.0000 mg | ORAL_TABLET | Freq: Four times a day (QID) | ORAL | Status: DC | PRN

## 2015-04-05 MED ORDER — MORPHINE SULFATE 4 MG/ML IJ SOLN
4.0000 mg | INTRAMUSCULAR | Status: DC | PRN
  Administered 2015-04-05: 4 mg via INTRAVENOUS
  Filled 2015-04-05: qty 1

## 2015-04-05 MED ORDER — ACETAMINOPHEN 650 MG RE SUPP: 650.0000 mg | Freq: Four times a day (QID) | RECTAL | Status: DC | PRN

## 2015-04-05 MED ORDER — DEXTROSE 5 % IV SOLN
1.0000 g | Freq: Once | INTRAVENOUS | Status: AC
  Administered 2015-04-05: 1 g via INTRAVENOUS
  Filled 2015-04-05: qty 10

## 2015-04-05 MED ORDER — ENOXAPARIN SODIUM 40 MG/0.4ML ~~LOC~~ SOLN
40.0000 mg | SUBCUTANEOUS | Status: DC
  Administered 2015-04-05 – 2015-04-06 (×2): 40 mg via SUBCUTANEOUS
  Filled 2015-04-05 (×2): qty 0.4

## 2015-04-05 NOTE — ED Notes (Signed)
Pt c/o bilateral flank pain, more in the right than left. Pt reports she has had 3 kidney infections in the last month. Pt says she was here 1 week ago and diagnosed with a kidney infection but it is not getting any better. Pt c/o burning and urgency with urination. Pt reports nausea, denies vomiting and fever.

## 2015-04-05 NOTE — H&P (Signed)
[facility] History and Physical  AMARANTA MEHL UXL:[id number] DOB: February 13, 1971 DOA: 04/05/2015  Referring physician: Dr. Clarene Duke, ER PCP: Kaleen Mask, MD   Chief Complaint: back pain  HPI: Regina Patton is a 44 y.o. female who presents to the emergency room with complaints of bilateral flank pain, dysuria, nausea. She is unaware of having any fevers at home. She reports that she has had a bladder infection for the past 1 month. She's been seen in the emergency room several times. On every visit to the emergency room, urine culture has been drawn and is positive for Escherichia coli. She's been treated with oral antibiotic based on available culture results, but has had only mild improvement of her symptoms. Her symptoms recur once her antibiotic course has been completed. She's had several emergency trips in the past 2 weeks. She comes back to the hospital today with worsening flank pain and dysuria. It is felt that she has failed outpatient treatment and admission has been requested for intravenous antibiotic.   Review of Systems:  Pertinent positives as per HPI, otherwise negative  Past Medical History  Diagnosis Date  . Hypertension   . Chronic back pain   . Sciatic pain     right  . Rib pain on right side     chronic  . Chronic neck pain   . Cervical radiculopathy   . Chronic knee pain   . Rheumatoid arthritis     "Rhematoid factor positive but no symptoms" per EPIC notes 2012  . Fibromyalgia   . Diabetes mellitus without complication    Past Surgical History  Procedure Laterality Date  . Tubal ligation    . Bladder surgery     Social History:  reports that she has never smoked. She does not have any smokeless tobacco history on file. She reports that she does not drink alcohol or use illicit drugs.  Allergies  Allergen Reactions  . Mango Flavor Anaphylaxis and Swelling    Lips swelling  . Penicillins Anaphylaxis and Hives  . Tramadol Nausea And  Vomiting  . Toradol [Ketorolac Tromethamine] Rash    Family History: mother has history of lupus and renal cancer, father died of colon cancer  Prior to Admission medications   Medication Sig Start Date End Date Taking? Authorizing Provider  lisinopril-hydrochlorothiazide (PRINZIDE,ZESTORETIC) 20-25 MG per tablet Take 2 tablets by mouth every morning.    Yes Historical Provider, MD  venlafaxine XR (EFFEXOR-XR) 75 MG 24 hr capsule Take 225 mg by mouth at bedtime.    Yes Historical Provider, MD  acetaminophen (TYLENOL) 500 MG tablet Take 1,000 mg by mouth every 6 (six) hours as needed for mild pain.     Historical Provider, MD   Physical Exam: Filed Vitals:   04/05/15 1035 04/05/15 1130 04/05/15 1319  BP: 128/93 118/74 123/79  Pulse: 95 93 70  Temp: 98.1 F (36.7 C)  98.1 F (36.7 C)  TempSrc: Oral  Oral  Resp: 17    Height: 5' (1.524 m)  5' (1.524 m)  Weight: 89.812 kg (198 lb)    SpO2: 97% 97% 96%    Wt Readings from Last 3 Encounters:  04/05/15 89.812 kg (198 lb)  03/31/15 86.183 kg (190 lb)  03/23/15 89.812 kg (198 lb)    General:  Appears calm and comfortable Eyes: PERRL, normal lids, irises & conjunctiva ENT: grossly normal hearing, lips & tongue Neck: no LAD, masses or thyromegaly Cardiovascular: RRR, no m/r/g. No LE edema. Telemetry: SR, no arrhythmias  Respiratory: CTA bilaterally, no w/r/r. Normal respiratory effort. Abdomen: soft, suprapubic tenderness, bs+, +bilateral CVA tenderness Skin: no rash or induration seen on limited exam Musculoskeletal: grossly normal tone BUE/BLE Psychiatric: grossly normal mood and affect, speech fluent and appropriate Neurologic: grossly non-focal.          Labs on Admission:  Basic Metabolic Panel:  Recent Labs Lab 03/31/15 1652 04/05/15 1048  NA 138 136  K 3.9 3.9  CL 102 102  CO2 27 24  GLUCOSE 97 104*  BUN 10 14  CREATININE 0.72 0.80  CALCIUM 9.0 9.1   Liver Function Tests:  Recent Labs Lab 03/31/15 1652  04/05/15 1048  AST 17 18  ALT 16 17  ALKPHOS 32* 30*  BILITOT 0.5 0.6  PROT 7.3 7.5  ALBUMIN 3.9 4.0    Recent Labs Lab 04/05/15 1048  LIPASE 34   No results for input(s): AMMONIA in the last 168 hours. CBC:  Recent Labs Lab 03/31/15 1652 04/05/15 1048  WBC 8.9 7.9  NEUTROABS 5.3 4.5  HGB 13.1 13.2  HCT 38.4 38.8  MCV 88.1 88.0  PLT 304 277   Cardiac Enzymes: No results for input(s): CKTOTAL, CKMB, CKMBINDEX, TROPONINI in the last 168 hours.  BNP (last 3 results) No results for input(s): BNP in the last 8760 hours.  ProBNP (last 3 results) No results for input(s): PROBNP in the last 8760 hours.  CBG: No results for input(s): GLUCAP in the last 168 hours.  Radiological Exams on Admission: No results found.   Assessment/Plan Principal Problem:   Pyelonephritis Active Problems:   Essential hypertension   Chronic pain syndrome   1. Pyelonephritis. Patient has urinalysis indicative of infection with bilateral flank pain indicating pyelonephritis. She'll be started on intravenous Rocephin. Urine culture has been sent. Recent CT scan of the abdomen and pelvis indicated bilateral urethral diverticuli were infection within these diverticuli could not be excluded. She will need follow-up with urology. 2. Essential hypertension. Continue outpatient regimen. Blood pressure currently stable. 3. Back pain. Patient does have chronic pain syndrome, fibromyalgia and is on chronic pain medications. Her back pain is likely worse due to #1. Will continue pain management 4. History of rheumatoid arthritis. She is not on any specific maintenance therapy. She does not have any evidence of joint flares at this point. Continue to monitor   Code Status: full code DVT Prophylaxis: lovenox Family Communication: discussed with patient and daughter Disposition Plan: discharge home once improved  Time spent:  [facility] Pager [phone]

## 2015-04-05 NOTE — Progress Notes (Signed)
Spoke with Dr Kerry Hough who states to call Urologist to make appointment for next week for patient so that it will be set up before she is discharged over the weekend. Urologist office called, closed at this time so unable to make appointment.

## 2015-04-05 NOTE — Progress Notes (Deleted)
[facility] History and Physical  Regina Patton MRN:[id number] DOB: 03/04/1971 DOA: 04/05/2015  Referring physician: Dr. McManus, ER PCP: ELKINS,WILSON OLIVER, MD   Chief Complaint: back pain  HPI: Regina Patton is a 44 y.o. female who presents to the emergency room with complaints of bilateral flank pain, dysuria, nausea. She is unaware of having any fevers at home. She reports that she has had a bladder infection for the past 1 month. She's been seen in the emergency room several times. On every visit to the emergency room, urine culture has been drawn and is positive for Escherichia coli. She's been treated with oral antibiotic based on available culture results, but has had only mild improvement of her symptoms. Her symptoms recur once her antibiotic course has been completed. She's had several emergency trips in the past 2 weeks. She comes back to the hospital today with worsening flank pain and dysuria. It is felt that she has failed outpatient treatment and admission has been requested for intravenous antibiotic.   Review of Systems:  Pertinent positives as per HPI, otherwise negative  Past Medical History  Diagnosis Date  . Hypertension   . Chronic back pain   . Sciatic pain     right  . Rib pain on right side     chronic  . Chronic neck pain   . Cervical radiculopathy   . Chronic knee pain   . Rheumatoid arthritis     "Rhematoid factor positive but no symptoms" per EPIC notes 2012  . Fibromyalgia   . Diabetes mellitus without complication    Past Surgical History  Procedure Laterality Date  . Tubal ligation    . Bladder surgery     Social History:  reports that she has never smoked. She does not have any smokeless tobacco history on file. She reports that she does not drink alcohol or use illicit drugs.  Allergies  Allergen Reactions  . Mango Flavor Anaphylaxis and Swelling    Lips swelling  . Penicillins Anaphylaxis and Hives  . Tramadol Nausea And  Vomiting  . Toradol [Ketorolac Tromethamine] Rash    Family History: mother has history of lupus and renal cancer, father died of colon cancer  Prior to Admission medications   Medication Sig Start Date End Date Taking? Authorizing Provider  lisinopril-hydrochlorothiazide (PRINZIDE,ZESTORETIC) 20-25 MG per tablet Take 2 tablets by mouth every morning.    Yes Historical Provider, MD  venlafaxine XR (EFFEXOR-XR) 75 MG 24 hr capsule Take 225 mg by mouth at bedtime.    Yes Historical Provider, MD  acetaminophen (TYLENOL) 500 MG tablet Take 1,000 mg by mouth every 6 (six) hours as needed for mild pain.     Historical Provider, MD   Physical Exam: Filed Vitals:   04/05/15 1035 04/05/15 1130 04/05/15 1319  BP: 128/93 118/74 123/79  Pulse: 95 93 70  Temp: 98.1 F (36.7 C)  98.1 F (36.7 C)  TempSrc: Oral  Oral  Resp: 17    Height: 5' (1.524 m)  5' (1.524 m)  Weight: 89.812 kg (198 lb)    SpO2: 97% 97% 96%    Wt Readings from Last 3 Encounters:  04/05/15 89.812 kg (198 lb)  03/31/15 86.183 kg (190 lb)  03/23/15 89.812 kg (198 lb)    General:  Appears calm and comfortable Eyes: PERRL, normal lids, irises & conjunctiva ENT: grossly normal hearing, lips & tongue Neck: no LAD, masses or thyromegaly Cardiovascular: RRR, no m/r/g. No LE edema. Telemetry: SR, no arrhythmias    Respiratory: CTA bilaterally, no w/r/r. Normal respiratory effort. Abdomen: soft, suprapubic tenderness, bs+, +bilateral CVA tenderness Skin: no rash or induration seen on limited exam Musculoskeletal: grossly normal tone BUE/BLE Psychiatric: grossly normal mood and affect, speech fluent and appropriate Neurologic: grossly non-focal.          Labs on Admission:  Basic Metabolic Panel:  Recent Labs Lab 03/31/15 1652 04/05/15 1048  NA 138 136  K 3.9 3.9  CL 102 102  CO2 27 24  GLUCOSE 97 104*  BUN 10 14  CREATININE 0.72 0.80  CALCIUM 9.0 9.1   Liver Function Tests:  Recent Labs Lab 03/31/15 1652  04/05/15 1048  AST 17 18  ALT 16 17  ALKPHOS 32* 30*  BILITOT 0.5 0.6  PROT 7.3 7.5  ALBUMIN 3.9 4.0    Recent Labs Lab 04/05/15 1048  LIPASE 34   No results for input(s): AMMONIA in the last 168 hours. CBC:  Recent Labs Lab 03/31/15 1652 04/05/15 1048  WBC 8.9 7.9  NEUTROABS 5.3 4.5  HGB 13.1 13.2  HCT 38.4 38.8  MCV 88.1 88.0  PLT 304 277   Cardiac Enzymes: No results for input(s): CKTOTAL, CKMB, CKMBINDEX, TROPONINI in the last 168 hours.  BNP (last 3 results) No results for input(s): BNP in the last 8760 hours.  ProBNP (last 3 results) No results for input(s): PROBNP in the last 8760 hours.  CBG: No results for input(s): GLUCAP in the last 168 hours.  Radiological Exams on Admission: No results found.   Assessment/Plan Principal Problem:   Pyelonephritis Active Problems:   Essential hypertension   Chronic pain syndrome   1. Pyelonephritis. Patient has urinalysis indicative of infection with bilateral flank pain indicating pyelonephritis. She'll be started on intravenous Rocephin. Urine culture has been sent. Recent CT scan of the abdomen and pelvis indicated bilateral urethral diverticuli were infection within these diverticuli could not be excluded. She will need follow-up with urology. 2. Essential hypertension. Continue outpatient regimen. Blood pressure currently stable. 3. Back pain. Patient does have chronic pain syndrome, fibromyalgia and is on chronic pain medications. Her back pain is likely worse due to #1. Will continue pain management 4. History of rheumatoid arthritis. She is not on any specific maintenance therapy. She does not have any evidence of joint flares at this point. Continue to monitor   Code Status: full code DVT Prophylaxis: lovenox Family Communication: discussed with patient and daughter Disposition Plan: discharge home once improved  Time spent:  [facility] Pager [phone]

## 2015-04-05 NOTE — ED Provider Notes (Signed)
CSN: [id number]     Arrival date & time 04/05/15  1026 History   First MD Initiated Contact with Patient 04/05/15 1035     Chief Complaint  Patient presents with  . Flank Pain      HPI Pt was seen at 1040. Per pt, c/o gradual onset and worsening of persistent bilat flank "pain" for the past 1 week. Has been associated with dysuria and nausea. Pt states she was seen in the ED 1 week ago for same, dx UTI, rx keflex. States her symptoms "have gotten worse." States she has had "3 kidney infections in the past month." Denies fevers, no vomiting, no vaginal bleeding/discharge, no abd pain. The symptoms have been associated with no other complaints. The patient has a significant history of similar symptoms previously, recently being evaluated for this complaint and multiple prior evals for same.  This is pt's 5th ED visit in the past 6 weeks for the same symptoms. States she has been taking all the antibiotics she has been prescribed, "but I'm not getting better."     Past Medical History  Diagnosis Date  . Hypertension   . Chronic back pain   . Sciatic pain     right  . Rib pain on right side     chronic  . Chronic neck pain   . Cervical radiculopathy   . Chronic knee pain   . Rheumatoid arthritis     "Rhematoid factor positive but no symptoms" per EPIC notes 2012  . Fibromyalgia   . Diabetes mellitus without complication    Past Surgical History  Procedure Laterality Date  . Tubal ligation    . Bladder surgery      History  Substance Use Topics  . Smoking status: Never Smoker   . Smokeless tobacco: Not on file  . Alcohol Use: No    Review of Systems ROS: Statement: All systems negative except as marked or noted in the HPI; Constitutional: Negative for fever and chills. ; ; Eyes: Negative for eye pain, redness and discharge. ; ; ENMT: Negative for ear pain, hoarseness, nasal congestion, sinus pressure and sore throat. ; ; Cardiovascular: Negative for chest pain, palpitations,  diaphoresis, dyspnea and peripheral edema. ; ; Respiratory: Negative for cough, wheezing and stridor. ; ; Gastrointestinal: +nausea. Negative for vomiting, diarrhea, abdominal pain, blood in stool, hematemesis, jaundice and rectal bleeding. . ; ; Genitourinary: +flank pain, dysuria. Negative for hematuria. ; ; GYN:  No vaginal bleeding, no vaginal discharge, no vulvar pain. ;; Musculoskeletal: Negative for back pain and neck pain. Negative for swelling and trauma.; ; Skin: Negative for pruritus, rash, abrasions, blisters, bruising and skin lesion.; ; Neuro: Negative for headache, lightheadedness and neck stiffness. Negative for weakness, altered level of consciousness , altered mental status, extremity weakness, paresthesias, involuntary movement, seizure and syncope.      Allergies  Mango flavor; Penicillins; Tramadol; and Toradol  Home Medications   Prior to Admission medications   Medication Sig Start Date End Date Taking? Authorizing Provider  acetaminophen (TYLENOL) 500 MG tablet Take 1,000 mg by mouth every 6 (six) hours as needed for mild pain.     Historical Provider, MD  cephALEXin (KEFLEX) 500 MG capsule Take 1 capsule (500 mg total) by mouth 4 (four) times daily. 03/31/15   Eber Hong, MD  ciprofloxacin (CIPRO) 500 MG tablet Take 1 tablet (500 mg total) by mouth every 12 (twelve) hours. Patient not taking: Reported on 03/31/2015 03/16/15   Margarita Grizzle, MD  diclofenac (  VOLTAREN) 75 MG EC tablet Take 1 tablet (75 mg total) by mouth 2 (two) times daily. Patient not taking: Reported on 03/16/2015 02/18/15   Ivery Quale, PA-C  HYDROcodone-acetaminophen (NORCO/VICODIN) 5-325 MG per tablet Take 2 tablets by mouth every 4 (four) hours as needed. 03/31/15   Eber Hong, MD  HYDROcodone-acetaminophen (NORCO/VICODIN) 5-325 MG per tablet Take 2 tablets by mouth every 4 (four) hours as needed for moderate pain. 03/31/15   Eber Hong, MD  lisinopril-hydrochlorothiazide (PRINZIDE,ZESTORETIC) 20-25 MG  per tablet Take 2 tablets by mouth every morning.     Historical Provider, MD  methocarbamol (ROBAXIN) 500 MG tablet Take 1 tablet (500 mg total) by mouth 3 (three) times daily. Patient not taking: Reported on 03/16/2015 02/18/15   Ivery Quale, PA-C  nitrofurantoin, macrocrystal-monohydrate, (MACROBID) 100 MG capsule Take 1 capsule (100 mg total) by mouth 2 (two) times daily. Patient not taking: Reported on 03/31/2015 03/23/15   Donnetta Hutching, MD  ondansetron [facility] ODT) 8 MG disintegrating tablet Take 1 tablet (8 mg total) by mouth every 8 (eight) hours as needed for nausea or vomiting. Patient not taking: Reported on 03/31/2015 03/16/15   Margarita Grizzle, MD  oxyCODONE-acetaminophen (PERCOCET/ROXICET) 5-325 MG per tablet Take 1-2 tablets by mouth every 6 (six) hours as needed. Patient not taking: Reported on 03/31/2015 03/23/15   Donnetta Hutching, MD  promethazine (PHENERGAN) 25 MG tablet Take 1 tablet (25 mg total) by mouth every 6 (six) hours as needed. Patient not taking: Reported on 03/31/2015 03/23/15   Donnetta Hutching, MD  venlafaxine XR (EFFEXOR-XR) 75 MG 24 hr capsule Take 225 mg by mouth at bedtime.     Historical Provider, MD   BP 128/93 mmHg  Pulse 95  Temp(Src) 98.1 F (36.7 C) (Oral)  Resp 17  Ht 5' (1.524 m)  Wt 198 lb (89.812 kg)  BMI 38.67 kg/m2  SpO2 97%  LMP 03/18/2015 Physical Exam  1045: Physical examination:  Nursing notes reviewed; Vital signs and O2 SAT reviewed;  Constitutional: Well developed, Well nourished, Well hydrated, In no acute distress; Head:  Normocephalic, atraumatic; Eyes: EOMI, PERRL, No scleral icterus; ENMT: Mouth and pharynx normal, Mucous membranes moist; Neck: Supple, Full range of motion, No lymphadenopathy; Cardiovascular: Regular rate and rhythm, No murmur, rub, or gallop; Respiratory: Breath sounds clear & equal bilaterally, No rales, rhonchi, wheezes.  Speaking full sentences with ease, Normal respiratory effort/excursion; Chest: Nontender, Movement normal; Abdomen:  Soft, Nontender, Nondistended, Normal bowel sounds; Genitourinary: +R>L CVA tenderness.; Extremities: Pulses normal, No tenderness, No edema, No calf edema or asymmetry.; Neuro: AA&Ox3, Major CN grossly intact.  Speech clear. No gross focal motor or sensory deficits in extremities.; Skin: Color normal, Warm, Dry.   ED Course  Procedures     EKG Interpretation None      MDM  MDM Reviewed: previous chart, nursing note and vitals Reviewed previous: labs and CT scan Interpretation: labs   Ct Abdomen Pelvis W Contrast 03/31/2015   CLINICAL DATA:  Bilateral flank pain and dysuria for 1 week  EXAM: CT ABDOMEN AND PELVIS WITH CONTRAST  TECHNIQUE: Multidetector CT imaging of the abdomen and pelvis was performed using the standard protocol following bolus administration of intravenous contrast.  CONTRAST:  OMNIPAQUE IOHEXOL 300 MG/ML  SOLN  COMPARISON:  February 23, 2015  FINDINGS: Lung bases are clear.  No focal liver lesions are identified. The gallbladder wall is not appreciably thickened. There is no biliary duct dilatation.  Spleen, pancreas, and adrenals appear normal.  There is a  cyst in the posterior lower pole left kidney measuring 1.5 x 1.5 cm. There is no other renal mass. There is no inflammatory focus in either kidney. There is no hydronephrosis. There is an extrarenal pelvis on each side, an anatomic variant. There is no renal or ureteral calculus on either side.  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 They are best seen on axial slices 91, 92, and 93 series 2 as well as on coronal slice is 46, 47, and 48 series 3. Urinary bladder is midline with normal wall thickness. There is no pelvic mass or pelvic fluid collection. Appendix appears normal.  There is no bowel obstruction. No free air or portal venous air. There is no demonstrable ascites, adenopathy, or abscess in the abdomen or pelvis. Aorta shows no evidence of aneurysm. There are no  blastic or lytic bone lesions.  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 Infection within these diverticula cannot be excluded.  There is no evidence of inflammation in either kidney. No hydronephrosis on either side. No renal or ureteral calculi.  No bowel obstruction.  No abscess.  Appendix appears normal.   Electronically Signed   By: Bretta Bang III M.D.   On: 03/31/2015 18:27   Results for orders placed or performed during the hospital encounter of 03/31/15  Urine culture  Result Value Ref Range   Specimen Description URINE, CLEAN CATCH    Special Requests NONE    Culture      >=100,000 COLONIES/mL ESCHERICHIA COLI Performed at [facility]    Report Status 04/03/2015 FINAL    Organism ID, Bacteria ESCHERICHIA COLI       Susceptibility   Escherichia coli - MIC*    AMPICILLIN >=32 RESISTANT Resistant     CEFAZOLIN <=4 SENSITIVE Sensitive     CEFTRIAXONE <=1 SENSITIVE Sensitive     CIPROFLOXACIN >=4 RESISTANT Resistant     GENTAMICIN <=1 SENSITIVE Sensitive     IMIPENEM <=0.25 SENSITIVE Sensitive     NITROFURANTOIN <=16 SENSITIVE Sensitive     TRIMETH/SULFA <=20 SENSITIVE Sensitive     AMPICILLIN/SULBACTAM 16 INTERMEDIATE Intermediate     PIP/TAZO <=4 SENSITIVE Sensitive     * >=100,000 COLONIES/mL ESCHERICHIA COLI     Results for orders placed or performed during the hospital encounter of 04/05/15  Pregnancy, urine  Result Value Ref Range   Preg Test, Ur NEGATIVE NEGATIVE  Urinalysis, Routine w reflex microscopic (not at [facility])  Result Value Ref Range   Color, Urine YELLOW YELLOW   APPearance HAZY (A) CLEAR   Specific Gravity, Urine >1.030 (H) 1.005 - 1.030   pH 5.5 5.0 - 8.0   Glucose, UA NEGATIVE NEGATIVE mg/dL   Hgb urine dipstick TRACE (A) NEGATIVE   Bilirubin Urine NEGATIVE NEGATIVE   Ketones, ur NEGATIVE NEGATIVE mg/dL   Protein, ur NEGATIVE NEGATIVE mg/dL   Urobilinogen, UA 0.2 0.0 - 1.0 mg/dL   Nitrite NEGATIVE NEGATIVE    Leukocytes, UA MODERATE (A) NEGATIVE  Comprehensive metabolic panel  Result Value Ref Range   Sodium 136 135 - 145 mmol/L   Potassium 3.9 3.5 - 5.1 mmol/L   Chloride 102 101 - 111 mmol/L   CO2 24 22 - 32 mmol/L   Glucose, Bld 104 (H) 65 - 99 mg/dL   BUN 14 6 - 20 mg/dL   Creatinine, Ser 3.23 0.44 - 1.00 mg/dL   Calcium 9.1 8.9 - 55.7 mg/dL   Total Protein 7.5 6.5 -  8.1 g/dL   Albumin 4.0 3.5 - 5.0 g/dL   AST 18 15 - 41 U/L   ALT 17 14 - 54 U/L   Alkaline Phosphatase 30 (L) 38 - 126 U/L   Total Bilirubin 0.6 0.3 - 1.2 mg/dL   GFR calc non Af Amer >60 >60 mL/min   GFR calc Af Amer >60 >60 mL/min   Anion gap 10 5 - 15  Lipase, blood  Result Value Ref Range   Lipase 34 22 - 51 U/L  Lactic acid, plasma  Result Value Ref Range   Lactic Acid, Venous 0.9 0.5 - 2.0 mmol/L  CBC with Differential  Result Value Ref Range   WBC 7.9 4.0 - 10.5 K/uL   RBC 4.41 3.87 - 5.11 MIL/uL   Hemoglobin 13.2 12.0 - 15.0 g/dL   HCT 80.3 21.2 - 24.8 %   MCV 88.0 78.0 - 100.0 fL   MCH 29.9 26.0 - 34.0 pg   MCHC 34.0 30.0 - 36.0 g/dL   RDW 25.0 03.7 - 04.8 %   Platelets 277 150 - 400 K/uL   Neutrophils Relative % 58 43 - 77 %   Neutro Abs 4.5 1.7 - 7.7 K/uL   Lymphocytes Relative 36 12 - 46 %   Lymphs Abs 2.9 0.7 - 4.0 K/uL   Monocytes Relative 4 3 - 12 %   Monocytes Absolute 0.3 0.1 - 1.0 K/uL   Eosinophils Relative 2 0 - 5 %   Eosinophils Absolute 0.2 0.0 - 0.7 K/uL   Basophils Relative 0 0 - 1 %   Basophils Absolute 0.0 0.0 - 0.1 K/uL  Urine microscopic-add on  Result Value Ref Range   Squamous Epithelial / LPF RARE RARE   WBC, UA 11-20 <3 WBC/hpf   RBC / HPF 3-6 <3 RBC/hpf   Bacteria, UA FEW (A) RARE    1220:  This is pt's 5th ED visit for the same complaint. Pt's previous UC +Ecoli, susceptible to both the previously rx macrobid and keflex. UC today is pending. IV rocephin given.  Dx and testing d/w pt and family.  Questions answered.  Verb understanding, agreeable to admit. T/C to [facility]  Dr. Kerry Hough, case discussed, including:  HPI, pertinent PM/SHx, VS/PE, dx testing, ED course and treatment:  Agreeable to admit, requests to write temporary orders, obtain observation medical bed to team 1.     Samuel Jester, DO 04/08/15 1222

## 2015-04-06 ENCOUNTER — Telehealth: Payer: Self-pay | Admitting: *Deleted

## 2015-04-06 DIAGNOSIS — G894 Chronic pain syndrome: Secondary | ICD-10-CM | POA: Diagnosis not present

## 2015-04-06 DIAGNOSIS — N12 Tubulo-interstitial nephritis, not specified as acute or chronic: Secondary | ICD-10-CM | POA: Diagnosis not present

## 2015-04-06 DIAGNOSIS — I10 Essential (primary) hypertension: Secondary | ICD-10-CM | POA: Diagnosis not present

## 2015-04-06 LAB — CBC
HCT: 37.3 % (ref 36.0–46.0)
Hemoglobin: 12.6 g/dL (ref 12.0–15.0)
MCH: 30.2 pg (ref 26.0–34.0)
MCHC: 33.8 g/dL (ref 30.0–36.0)
MCV: 89.4 fL (ref 78.0–100.0)
Platelets: 289 10*3/uL (ref 150–400)
RBC: 4.17 MIL/uL (ref 3.87–5.11)
RDW: 13.2 % (ref 11.5–15.5)
WBC: 8.4 10*3/uL (ref 4.0–10.5)

## 2015-04-06 LAB — BASIC METABOLIC PANEL
Anion gap: 7 (ref 5–15)
BUN: 11 mg/dL (ref 6–20)
CALCIUM: 8.8 mg/dL — AB (ref 8.9–10.3)
CO2: 28 mmol/L (ref 22–32)
Chloride: 102 mmol/L (ref 101–111)
Creatinine, Ser: 0.78 mg/dL (ref 0.44–1.00)
GFR calc Af Amer: >60 mL/min (ref >60)
GFR calc non Af Amer: >60 mL/min (ref >60)
Glucose, Bld: 88 mg/dL (ref 65–99)
Potassium: 3.7 mmol/L (ref 3.5–5.1)
SODIUM: 137 mmol/L (ref 135–145)

## 2015-04-06 MED ORDER — HYDROCHLOROTHIAZIDE 50 MG PO TABS
50.0000 mg | ORAL_TABLET | Freq: Every day | ORAL | Status: DC
  Filled 2015-04-06 (×3): qty 1

## 2015-04-06 MED ORDER — HYDROCHLOROTHIAZIDE 25 MG PO TABS
50.0000 mg | ORAL_TABLET | Freq: Every day | ORAL | Status: DC
  Administered 2015-04-06: 50 mg via ORAL
  Filled 2015-04-06 (×2): qty 2

## 2015-04-06 MED ORDER — HYDROCHLOROTHIAZIDE 50 MG PO TABS: 50.0000 mg | ORAL_TABLET | Freq: Every day | ORAL | Status: DC

## 2015-04-06 MED ORDER — DEXTROSE 5 % IV SOLN
1.0000 g | INTRAVENOUS | Status: DC
  Administered 2015-04-06: 1 g via INTRAVENOUS
  Filled 2015-04-06 (×3): qty 10

## 2015-04-06 MED ORDER — LISINOPRIL 10 MG PO TABS: 40.0000 mg | ORAL_TABLET | Freq: Every day | ORAL | Status: DC

## 2015-04-06 NOTE — Progress Notes (Signed)
[facility] PROGRESS NOTE  [name] EPP:[id number] DOB: October 17, 1970 DOA: 04/05/2015 PCP: Kaleen Mask, MD  Assessment/Plan: 1. Pyelonephritis. Patient has urinalysis indicative of infection with bilateral flank pain indicating pyelonephritis. Will continue intravenous Rocephin. Urine culture pending. Recent CT scan of the abdomen and pelvis indicated bilateral urethral diverticuli were infection within these diverticuli could not be excluded. Will follow-up with Urology as outpatient. 2. Essential hypertension. Stable. Will continue outpatient regimen.  3. Back pain. Patient does have chronic pain syndrome, fibromyalgia and is on chronic pain medications. Her back pain is likely worse due to pyelo. Pain management continued. 4. History of rheumatoid arthritis. She is not on any specific maintenance therapy. She does not have any evidence of joint flares at this point. Will continue to monitor.  Code Status: Full DVT Prophylaxis: Lovenox Family Communication: Family at bedside with pt. Discussed plan and they understand.  Disposition Plan: Home once improved    Consultants:    Procedures:    Antibiotics:  Rocephin 7/30 >>   HPI/Subjective: Pt reports still having back pain and dysuria. She reports the pain medicine to help with the pain. No reported chest pain, SOB, n/v. She is able to eat without difficulty. She has been ambulatory in her room without difficulty.   She was taking 7.5mg  Hydrocodone in the past for RA and fibromyalgia in the back and knees. She was taken off of the medication 1 year ago and referred to the pain clinic, she is still trying to get into the clinic.    Objective: Filed Vitals:   04/06/15 0851  BP: 108/75  Pulse: 92  Temp:   Resp:     Intake/Output Summary (Last 24 hours) at 04/06/15 1102 Last data filed at 04/06/15 0902  Gross per 24 hour  Intake 896.67 ml  Output   2400 ml  Net -1503.33 ml   Filed Weights   04/05/15  1035  Weight: 89.812 kg (198 lb)    Exam:   General:  VSS, NAD, appears comfortable, and calm.   Cardiovascular: RRR, S1, S2  Respiratory: CTAB  Abdomen: ntnd, soft, positive bowel sounds   Musculoskeletal: FROM, no BLE edema     Data Reviewed: Basic Metabolic Panel:  Recent Labs Lab 03/31/15 1652 04/05/15 1048 04/06/15 0617  NA 138 136 137  K 3.9 3.9 3.7  CL 102 102 102  CO2 27 24 28   GLUCOSE 97 104* 88  BUN 10 14 11   CREATININE 0.72 0.80 0.78  CALCIUM 9.0 9.1 8.8*   Liver Function Tests:  Recent Labs Lab 03/31/15 1652 04/05/15 1048  AST 17 18  ALT 16 17  ALKPHOS 32* 30*  BILITOT 0.5 0.6  PROT 7.3 7.5  ALBUMIN 3.9 4.0    Recent Labs Lab 04/05/15 1048  LIPASE 34   No results for input(s): AMMONIA in the last 168 hours. CBC:  Recent Labs Lab 03/31/15 1652 04/05/15 1048 04/06/15 0617  WBC 8.9 7.9 8.4  NEUTROABS 5.3 4.5  --   HGB 13.1 13.2 12.6  HCT 38.4 38.8 37.3  MCV 88.1 88.0 89.4  PLT 304 277 289   Cardiac Enzymes: No results for input(s): CKTOTAL, CKMB, CKMBINDEX, TROPONINI in the last 168 hours. BNP (last 3 results) No results for input(s): BNP in the last 8760 hours.  ProBNP (last 3 results) No results for input(s): PROBNP in the last 8760 hours.  CBG: No results for input(s): GLUCAP in the last 168 hours.  Recent Results (from the past 240 hour(s))  Urine culture     Status: None   Collection Time: 03/31/15  3:34 PM  Result Value Ref Range Status   Specimen Description URINE, CLEAN CATCH  Final   Special Requests NONE  Final   Culture   Final    >=100,000 COLONIES/mL ESCHERICHIA COLI Performed at [facility]    Report Status 04/03/2015 FINAL  Final   Organism ID, Bacteria ESCHERICHIA COLI  Final      Susceptibility   Escherichia coli - MIC*    AMPICILLIN >=32 RESISTANT Resistant     CEFAZOLIN <=4 SENSITIVE Sensitive     CEFTRIAXONE <=1 SENSITIVE Sensitive     CIPROFLOXACIN >=4 RESISTANT Resistant      GENTAMICIN <=1 SENSITIVE Sensitive     IMIPENEM <=0.25 SENSITIVE Sensitive     NITROFURANTOIN <=16 SENSITIVE Sensitive     TRIMETH/SULFA <=20 SENSITIVE Sensitive     AMPICILLIN/SULBACTAM 16 INTERMEDIATE Intermediate     PIP/TAZO <=4 SENSITIVE Sensitive     * >=100,000 COLONIES/mL ESCHERICHIA COLI  Urine culture     Status: None (Preliminary result)   Collection Time: 04/05/15 11:07 AM  Result Value Ref Range Status   Specimen Description URINE, CLEAN CATCH  Final   Special Requests NONE  Final   Culture   Final    CULTURE REINCUBATED FOR BETTER GROWTH Performed at [facility]    Report Status PENDING  Incomplete     Studies: No results found.  Scheduled Meds: . cefTRIAXone (ROCEPHIN)  IV  1 g Intravenous Q24H  . enoxaparin (LOVENOX) injection  40 mg Subcutaneous Q24H  . hydrochlorothiazide  50 mg Oral Daily  . lisinopril  40 mg Oral Daily  . venlafaxine XR  225 mg Oral QHS   Continuous Infusions: . sodium chloride 100 mL/hr at 04/06/15 0545    Principal Problem:   Pyelonephritis Active Problems:   Essential hypertension   Chronic pain syndrome    Time spent: 20 minutes     Regina Patton, M.D. [facility] Pager [phone]. If 7PM-7AM, please contact night-coverage at [email or web address], password [facility] 04/06/2015, 11:02 AM       I, Regina Patton, acting a scribe, recorded this note contemporaneously in the presence of Dr. Erick Patton, M.D. on 04/06/2015 at 11:02 AM    I have reviewed the above documentation for accuracy and completeness, and I agree with the above.  Regina Patton

## 2015-04-06 NOTE — ED Notes (Signed)
(+)  urine culture, treated with Cephalexin, OK per pharm 

## 2015-04-07 DIAGNOSIS — G894 Chronic pain syndrome: Secondary | ICD-10-CM | POA: Diagnosis not present

## 2015-04-07 DIAGNOSIS — N12 Tubulo-interstitial nephritis, not specified as acute or chronic: Secondary | ICD-10-CM | POA: Diagnosis not present

## 2015-04-07 DIAGNOSIS — I10 Essential (primary) hypertension: Secondary | ICD-10-CM | POA: Diagnosis not present

## 2015-04-07 LAB — URINE CULTURE

## 2015-04-07 MED ORDER — SULFAMETHOXAZOLE-TRIMETHOPRIM 800-160 MG PO TABS: 1.0000 | ORAL_TABLET | Freq: Two times a day (BID) | ORAL | Status: DC

## 2015-04-07 MED ORDER — OXYCODONE-ACETAMINOPHEN 5-325 MG PO TABS: 1.0000 | ORAL_TABLET | ORAL | Status: DC | PRN

## 2015-04-07 NOTE — Discharge Summary (Signed)
Physician Discharge Summary  Regina Patton UVO:[id number] DOB: 27-Nov-1970 DOA: 04/05/2015  PCP: Kaleen Mask, MD  Admit date: 04/05/2015 Discharge date: 04/07/2015  Time spent: 35 minutes  Recommendations for Outpatient Follow-up:  1. Begin Bactrim as outpatient 2. Follow up with Urology as instructed 3. Stay hydrated and try drinking cranberry juice to help with symptoms   Discharge Diagnoses:  Principal Problem:   Pyelonephritis Active Problems:   Essential hypertension   Chronic pain syndrome   Discharge Condition: Improved  Diet recommendation: Heart healthy  Filed Weights   04/05/15 1035  Weight: 89.812 kg (198 lb)    History of present illness:  Regina Patton is a 44 y.o. female who presented to the emergency room with complaints of bilateral flank pain, dysuria, nausea. She is unaware of having any fevers at home. She reports that she has had a bladder infection for the past 1 month. She's been seen in the emergency room several times. On every visit to the emergency room, urine culture was drawn and positive for Escherichia coli. She's been treated with oral antibiotic based on available culture results but has had only mild improvement of her symptoms. Her symptoms recur once her antibiotic course has been completed. She's had several emergency trips in the past 2 weeks. She comes back to the hospital today with worsening flank pain and dysuria. It is felt that she has failed outpatient treatment and admission has been requested for intravenous antibiotic.  Hospital Course:  Patient to the admitted to the hospital for Pyelonephritis. UA was indicative of infection with bilateral flank pain indicating pyelonephritis. Patient treated with IV rocephin . Urine culture showed no significant growth. Based on previous culture data (patient has consistently grown E coli on urine cultures), she was placed on Bactrim as outpatient. Recent CT scan of the abdomen and pelvis  indicated bilateral urethral diverticuli where infection within these diverticuli could not be excluded. She will need outpatient urology evaluation.  1. Essential hypertension. Stable. Will continue outpatient regimen.  2. Back pain. Patient does have chronic pain syndrome, fibromyalgia and is on chronic pain medications. Her back pain is likely worse due to pyelo. Pain management will continue as outpatient.  She has been advised to seek referral from her primary doctor for a pain clinic 3. History of rheumatoid arthritis. She is not on any specific maintenance therapy. She does not have any evidence of joint flares.   Procedures:    Consultations:    Discharge Exam: Filed Vitals:   04/07/15 0808  BP: 112/73  Pulse:   Temp:   Resp:      General: Vital signs stable, appears comfortable, and calm.   Cardiovascular: Regular rate and rythym, S1, S2, no m/r/g  Respiratory: Clear to ausculation bilaterally   Abdomen: non tender abdomnen, non distended, soft, positive bowel sounds   Musculoskeletal: no B/L edema  Discharge Instructions    Current Discharge Medication List    CONTINUE these medications which have NOT CHANGED   Details  lisinopril-hydrochlorothiazide (PRINZIDE,ZESTORETIC) 20-25 MG per tablet Take 2 tablets by mouth every morning.     venlafaxine XR (EFFEXOR-XR) 75 MG 24 hr capsule Take 225 mg by mouth at bedtime.     acetaminophen (TYLENOL) 500 MG tablet Take 1,000 mg by mouth every 6 (six) hours as needed for mild pain.        Allergies  Allergen Reactions  . Mango Flavor Anaphylaxis and Swelling    Lips swelling  . Penicillins Anaphylaxis and Hives  .  Tramadol Nausea And Vomiting  . Toradol [Ketorolac Tromethamine] Rash      The results of significant diagnostics from this hospitalization (including imaging, microbiology, ancillary and laboratory) are listed below for reference.    Significant Diagnostic Studies: Dg Chest 2  View  03/16/2015   CLINICAL DATA:  Cough, back pain.  EXAM: CHEST  2 VIEW  COMPARISON:  February 23, 2014.  FINDINGS: The heart size and mediastinal contours are within normal limits. Both lungs are clear. No pneumothorax or pleural effusion is noted. The visualized skeletal structures are unremarkable.  IMPRESSION: No active cardiopulmonary disease.   Electronically Signed   By: Lupita Raider, M.D.   On: 03/16/2015 14:21   Dg Abd 1 View  03/16/2015   CLINICAL DATA:  Nausea/vomiting, right upper quadrant pain  EXAM: ABDOMEN - 1 VIEW  COMPARISON:  CT abdomen pelvis dated 02/23/2015  FINDINGS: Nonobstructive bowel gas pattern.  Moderate stool in the cecum/ascending colon.  Calcified pelvic phleboliths.  Visualized osseous structures are within normal limits.  IMPRESSION: Nonobstructive bowel gas pattern.  Moderate stool in the cecum/ascending colon.   Electronically Signed   By: Charline Bills M.D.   On: 03/16/2015 14:20   Ct Abdomen Pelvis W Contrast  03/31/2015   CLINICAL DATA:  Bilateral flank pain and dysuria for 1 week  EXAM: CT ABDOMEN AND PELVIS WITH CONTRAST  TECHNIQUE: Multidetector CT imaging of the abdomen and pelvis was performed using the standard protocol following bolus administration of intravenous contrast.  CONTRAST:  OMNIPAQUE IOHEXOL 300 MG/ML  SOLN  COMPARISON:  February 23, 2015  FINDINGS: Lung bases are clear.  No focal liver lesions are identified. The gallbladder wall is not appreciably thickened. There is no biliary duct dilatation.  Spleen, pancreas, and adrenals appear normal.  There is a cyst in the posterior lower pole left kidney measuring 1.5 x 1.5 cm. There is no other renal mass. There is no inflammatory focus in either kidney. There is no hydronephrosis. There is an extrarenal pelvis on each side, an anatomic variant. There is no renal or ureteral calculus on either side.  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 They are best  seen on axial slices 91, 92, and 93 series 2 as well as on coronal slice is 46, 47, and 48 series 3. Urinary bladder is midline with normal wall thickness. There is no pelvic mass or pelvic fluid collection. Appendix appears normal.  There is no bowel obstruction. No free air or portal venous air. There is no demonstrable ascites, adenopathy, or abscess in the abdomen or pelvis. Aorta shows no evidence of aneurysm. There are no blastic or lytic bone lesions.  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 Infection within these diverticula cannot be excluded.  There is no evidence of inflammation in either kidney. No hydronephrosis on either side. No renal or ureteral calculi.  No bowel obstruction.  No abscess.  Appendix appears normal.   Electronically Signed   By: Bretta Bang III M.D.   On: 03/31/2015 18:27    Microbiology: Recent Results (from the past 240 hour(s))  Urine culture     Status: None   Collection Time: 03/31/15  3:34 PM  Result Value Ref Range Status   Specimen Description URINE, CLEAN CATCH  Final   Special Requests NONE  Final   Culture   Final    >=100,000 COLONIES/mL ESCHERICHIA COLI Performed at [facility]    Report Status  04/03/2015 FINAL  Final   Organism ID, Bacteria ESCHERICHIA COLI  Final      Susceptibility   Escherichia coli - MIC*    AMPICILLIN >=32 RESISTANT Resistant     CEFAZOLIN <=4 SENSITIVE Sensitive     CEFTRIAXONE <=1 SENSITIVE Sensitive     CIPROFLOXACIN >=4 RESISTANT Resistant     GENTAMICIN <=1 SENSITIVE Sensitive     IMIPENEM <=0.25 SENSITIVE Sensitive     NITROFURANTOIN <=16 SENSITIVE Sensitive     TRIMETH/SULFA <=20 SENSITIVE Sensitive     AMPICILLIN/SULBACTAM 16 INTERMEDIATE Intermediate     PIP/TAZO <=4 SENSITIVE Sensitive     * >=100,000 COLONIES/mL ESCHERICHIA COLI  Urine culture     Status: None   Collection Time: 04/05/15 11:07 AM  Result Value Ref Range Status   Specimen Description URINE, CLEAN CATCH   Final   Special Requests NONE  Final   Culture   Final    MULTIPLE SPECIES PRESENT, SUGGEST RECOLLECTION Performed at [facility]    Report Status 04/07/2015 FINAL  Final     Labs: Basic Metabolic Panel:  Recent Labs Lab 03/31/15 1652 04/05/15 1048 04/06/15 0617  NA 138 136 137  K 3.9 3.9 3.7  CL 102 102 102  CO2 27 24 28   GLUCOSE 97 104* 88  BUN 10 14 11   CREATININE 0.72 0.80 0.78  CALCIUM 9.0 9.1 8.8*   Liver Function Tests:  Recent Labs Lab 03/31/15 1652 04/05/15 1048  AST 17 18  ALT 16 17  ALKPHOS 32* 30*  BILITOT 0.5 0.6  PROT 7.3 7.5  ALBUMIN 3.9 4.0    Recent Labs Lab 04/05/15 1048  LIPASE 34   CBC:  Recent Labs Lab 03/31/15 1652 04/05/15 1048 04/06/15 0617  WBC 8.9 7.9 8.4  NEUTROABS 5.3 4.5  --   HGB 13.1 13.2 12.6  HCT 38.4 38.8 37.3  MCV 88.1 88.0 89.4  PLT 304 277 289      Signed:  04/07/15, M.D.  [facility] 04/07/2015, 11:39 AM   I, Erick Blinks, acting a scribe, recorded this note contemporaneously in the presence of Dr. 04/09/2015, M.D. on 04/07/2015 at 11:39 AM  Attending: I have reviewed the above documentation for accuracy and completeness, and I agree with the above.  MEMON,JEHANZEB

## 2015-04-07 NOTE — Progress Notes (Signed)
Patient discharged with instructions, prescription, and care notes.  Verbalized understanding via teach back.  IV was removed and the site was WNL. Patient voiced no further complaints or concerns at the time of discharge.  Appointments scheduled per instructions.  Patient left the floor via w/c with staff and family in stable condition. 

## 2015-04-11 ENCOUNTER — Encounter (HOSPITAL_COMMUNITY): Payer: Self-pay | Admitting: Emergency Medicine

## 2015-04-11 ENCOUNTER — Emergency Department (HOSPITAL_COMMUNITY)
Admission: EM | Admit: 2015-04-11 | Discharge: 2015-04-11 | Disposition: A | Discharge status: Home / Self Care | Payer: Medicare Other | Source: Home / Self Care | Attending: Emergency Medicine | Admitting: Emergency Medicine

## 2015-04-11 DIAGNOSIS — Z792 Long term (current) use of antibiotics: Secondary | ICD-10-CM

## 2015-04-11 DIAGNOSIS — Z880 Allergy status to penicillin: Secondary | ICD-10-CM

## 2015-04-11 DIAGNOSIS — E119 Type 2 diabetes mellitus without complications: Secondary | ICD-10-CM | POA: Insufficient documentation

## 2015-04-11 DIAGNOSIS — I10 Essential (primary) hypertension: Secondary | ICD-10-CM | POA: Insufficient documentation

## 2015-04-11 DIAGNOSIS — R1310 Dysphagia, unspecified: Secondary | ICD-10-CM

## 2015-04-11 DIAGNOSIS — T7840XA Allergy, unspecified, initial encounter: Secondary | ICD-10-CM

## 2015-04-11 DIAGNOSIS — N390 Urinary tract infection, site not specified: Secondary | ICD-10-CM

## 2015-04-11 DIAGNOSIS — N12 Tubulo-interstitial nephritis, not specified as acute or chronic: Secondary | ICD-10-CM | POA: Diagnosis not present

## 2015-04-11 DIAGNOSIS — M797 Fibromyalgia: Secondary | ICD-10-CM

## 2015-04-11 DIAGNOSIS — Z3202 Encounter for pregnancy test, result negative: Secondary | ICD-10-CM

## 2015-04-11 DIAGNOSIS — T368X5A Adverse effect of other systemic antibiotics, initial encounter: Secondary | ICD-10-CM | POA: Insufficient documentation

## 2015-04-11 DIAGNOSIS — G8929 Other chronic pain: Secondary | ICD-10-CM | POA: Insufficient documentation

## 2015-04-11 DIAGNOSIS — Z79899 Other long term (current) drug therapy: Secondary | ICD-10-CM

## 2015-04-11 DIAGNOSIS — R112 Nausea with vomiting, unspecified: Secondary | ICD-10-CM | POA: Diagnosis not present

## 2015-04-11 LAB — URINE MICROSCOPIC-ADD ON

## 2015-04-11 LAB — URINALYSIS, ROUTINE W REFLEX MICROSCOPIC
Bilirubin Urine: NEGATIVE
Glucose, UA: NEGATIVE mg/dL
KETONES UR: NEGATIVE mg/dL
NITRITE: NEGATIVE
Protein, ur: NEGATIVE mg/dL
Specific Gravity, Urine: 1.025 (ref 1.005–1.030)
Urobilinogen, UA: 0.2 mg/dL (ref 0.0–1.0)
pH: 6 (ref 5.0–8.0)

## 2015-04-11 LAB — POC URINE PREG, ED: PREG TEST UR: NEGATIVE

## 2015-04-11 MED ORDER — METHYLPREDNISOLONE SODIUM SUCC 125 MG IJ SOLR
125.0000 mg | Freq: Once | INTRAMUSCULAR | Status: AC
  Administered 2015-04-11: 125 mg via INTRAVENOUS
  Filled 2015-04-11: qty 2

## 2015-04-11 MED ORDER — FAMOTIDINE IN NACL 20-0.9 MG/50ML-% IV SOLN
20.0000 mg | Freq: Once | INTRAVENOUS | Status: AC
  Administered 2015-04-11: 20 mg via INTRAVENOUS
  Filled 2015-04-11: qty 50

## 2015-04-11 MED ORDER — DIPHENHYDRAMINE HCL 25 MG PO TABS
25.0000 mg | ORAL_TABLET | ORAL | Status: DC | PRN
Start: 2015-04-11 — End: 2015-05-04

## 2015-04-11 MED ORDER — CEPHALEXIN 500 MG PO CAPS: 500.0000 mg | ORAL_CAPSULE | Freq: Four times a day (QID) | ORAL | Status: DC

## 2015-04-11 MED ORDER — PREDNISONE 10 MG PO TABS: ORAL_TABLET | ORAL | Status: DC

## 2015-04-11 MED ORDER — OXYCODONE-ACETAMINOPHEN 5-325 MG PO TABS
2.0000 | ORAL_TABLET | Freq: Once | ORAL | Status: AC
  Administered 2015-04-11: 2 via ORAL
  Filled 2015-04-11: qty 2

## 2015-04-11 MED ORDER — DIPHENHYDRAMINE HCL 50 MG/ML IJ SOLN
25.0000 mg | Freq: Once | INTRAMUSCULAR | Status: AC
  Administered 2015-04-11: 25 mg via INTRAVENOUS
  Filled 2015-04-11: qty 1

## 2015-04-11 NOTE — ED Notes (Signed)
Pt requesting prescription pain medication. PA Triplett notified and is at bedside.

## 2015-04-11 NOTE — ED Notes (Signed)
Pt requesting medication for pain in lower back from recent kidney infection. PA Tammy made aware.

## 2015-04-11 NOTE — Discharge Instructions (Signed)
Drug Allergy °Allergic reactions to medicines are common. Some allergic reactions are mild. A delayed type of drug allergy that occurs 1 week or more after exposure to a medicine or vaccine is called serum sickness. A life-threatening, sudden (acute) allergic reaction that involves the whole body is called anaphylaxis. °CAUSES  °"True" drug allergies occur when there is an allergic reaction to a medicine. This is caused by overactivity of the immune system. First, the body becomes sensitized. The immune system is triggered by your first exposure to the medicine. Following this first exposure, future exposure to the same medicine may be life-threatening. °Almost any medicine can cause an allergic reaction. Common ones are: °· Penicillin. °· Sulfonamides (sulfa drugs). °· Local anesthetics. °· X-ray dyes that contain iodine. °SYMPTOMS  °Common symptoms of a minor allergic reaction are: °· Swelling around the mouth. °· An itchy red rash or hives. °· Vomiting or diarrhea. °Anaphylaxis can cause swelling of the mouth and throat. This makes it difficult to breathe and swallow. Severe reactions can be fatal within seconds, even after exposure to only a trace amount of the drug that causes the reaction. °HOME CARE INSTRUCTIONS  °· If you are unsure of what caused your reaction, keep a diary of foods and medicines used. Include the symptoms that followed. Avoid anything that causes reactions. °· You may want to follow up with an allergy specialist after the reaction has cleared in order to be tested to confirm the allergy. It is important to confirm that your reaction is an allergy, not just a side effect to the medicine. If you have a true allergy to a medicine, this may prevent that medicine and related medicines from being given to you when you are very ill. °· If you have hives or a rash: °¨ Take medicines as directed by your caregiver. °¨ You may use an over-the-counter antihistamine (diphenhydramine) as  needed. °¨ Apply cold compresses to the skin or take baths in cool water. Avoid hot baths or showers. °· If you are severely allergic: °¨ Continuous observation after a severe reaction may be needed. Hospitalization is often required. °¨ Wear a medical alert bracelet or necklace stating your allergy. °¨ You and your family must learn how to use an anaphylaxis kit or give an epinephrine injection to temporarily treat an emergency allergic reaction. If you have had a severe reaction, always carry your epinephrine injection or anaphylaxis kit with you. This can be lifesaving if you have a severe reaction. °· Do not drive or perform tasks after treatment until the medicines used to treat your reaction have worn off, or until your caregiver says it is okay. °SEEK MEDICAL CARE IF:  °· You think you had an allergic reaction. Symptoms usually start within 30 minutes after exposure. °· Symptoms are getting worse rather than better. °· You develop new symptoms. °· The symptoms that brought you to your caregiver return. °SEEK IMMEDIATE MEDICAL CARE IF:  °· You have swelling of the mouth, difficulty breathing, or wheezing. °· You have a tight feeling in your chest or throat. °· You develop hives, swelling, or itching all over your body. °· You develop severe vomiting or diarrhea. °· You feel faint or pass out. °This is an emergency. Use your epinephrine injection or anaphylaxis kit as you have been instructed. Call for emergency medical help. Even if you improve after the injection, you need to be examined at a hospital emergency department. °MAKE SURE YOU:  °· Understand these instructions. °· Will watch   your condition.  Will get help right away if you are not doing well or get worse. Document Released: 08/24/2005 Document Revised: 11/16/2011 Document Reviewed: 01/28/2011 [facility] Patient Information 2015 [place], [place]. This information is not intended to replace advice given to you by your health care provider. Make  sure you discuss any questions you have with your health care provider.

## 2015-04-11 NOTE — ED Provider Notes (Signed)
CSN: [id number]     Arrival date & time 04/11/15  0821 History   First MD Initiated Contact with Patient 04/11/15 [phone]     Chief Complaint  Patient presents with  . Allergic Reaction     (Consider location/radiation/quality/duration/timing/severity/associated sxs/prior Treatment) HPI   Regina Patton is a 44 y.o. female with hx of recurrent UTI's, presents to the Emergency Department complaining of sudden onset of rash, itching and tightness of her throat that began approximately 30 minutes prior to ED arrival.  She states that Monday she started taking Bactrim twice daily for a UTI.  This morning, she noticed a rash to her legs that suddenly spread "all over" and associated with itching and states her throat "felt funny."  She contacted EMS and was given 50 mg of Benadryl IV en route and now states that her symptoms have improved.  She denies pain, shortness of breath, chest pain, facial or tongue swelling, and difficulty swallowing at present.  She states that she feels as though the infection is returning, she noticed burning with urination this morning and back pain and her urine appeared cloudy.  Upon her hospital discharge, she was instructed to f/u with [facility], but cannot get an appt until October.    Past Medical History  Diagnosis Date  . Hypertension   . Chronic back pain   . Sciatic pain     right  . Rib pain on right side     chronic  . Chronic neck pain   . Cervical radiculopathy   . Chronic knee pain   . Rheumatoid arthritis     "Rhematoid factor positive but no symptoms" per EPIC notes 2012  . Fibromyalgia   . Diabetes mellitus without complication    Past Surgical History  Procedure Laterality Date  . Tubal ligation    . Bladder surgery     No family history on file. History  Substance Use Topics  . Smoking status: Never Smoker   . Smokeless tobacco: Not on file  . Alcohol Use: No   OB History    Gravida Para Term Preterm AB TAB SAB Ectopic  Multiple Living            3     Review of Systems  Constitutional: Negative for fever and chills.  HENT: Positive for trouble swallowing.   Respiratory: Negative for chest tightness, shortness of breath and wheezing.   Cardiovascular: Negative for chest pain.  Gastrointestinal: Negative for nausea, vomiting and abdominal pain.  Genitourinary: Positive for dysuria. Negative for frequency, decreased urine volume, vaginal bleeding, vaginal discharge and difficulty urinating.  Musculoskeletal: Positive for back pain.  Skin: Positive for rash.  Neurological: Negative for dizziness, weakness and headaches.  Psychiatric/Behavioral: The patient is not nervous/anxious.   All other systems reviewed and are negative.     Allergies  Mango flavor; Penicillins; Tramadol; and Toradol  Home Medications   Prior to Admission medications   Medication Sig Start Date End Date Taking? Authorizing Provider  acetaminophen (TYLENOL) 500 MG tablet Take 1,000 mg by mouth every 6 (six) hours as needed for mild pain.     Historical Provider, MD  lisinopril-hydrochlorothiazide (PRINZIDE,ZESTORETIC) 20-25 MG per tablet Take 2 tablets by mouth every morning.     Historical Provider, MD  oxyCODONE-acetaminophen (PERCOCET/ROXICET) 5-325 MG per tablet Take 1 tablet by mouth every 4 (four) hours as needed for moderate pain. 04/07/15   Erick Blinks, MD  sulfamethoxazole-trimethoprim (BACTRIM DS,SEPTRA DS) 800-160 MG per tablet Take  1 tablet by mouth 2 (two) times daily. 04/07/15   Erick Blinks, MD  venlafaxine XR (EFFEXOR-XR) 75 MG 24 hr capsule Take 225 mg by mouth at bedtime.     Historical Provider, MD   BP 130/84 mmHg  Pulse 99  Temp(Src) 97.5 F (36.4 C) (Oral)  Resp 18  Ht 5' (1.524 m)  Wt 198 lb (89.812 kg)  BMI 38.67 kg/m2  SpO2 100%  LMP 03/18/2015 Physical Exam  Constitutional: She is oriented to person, place, and time. She appears well-developed and well-nourished. No distress.  HENT:  Head:  Normocephalic.  Mouth/Throat: Uvula is midline, oropharynx is clear and moist and mucous membranes are normal. No uvula swelling.  Airway patent, no edema  Eyes: EOM are normal. Pupils are equal, round, and reactive to light.  Neck: Normal range of motion. Neck supple. No tracheal deviation present.  Cardiovascular: Normal rate, regular rhythm and normal heart sounds.   No murmur heard. Pulmonary/Chest: Effort normal and breath sounds normal. No respiratory distress. She has no wheezes.  Musculoskeletal: Normal range of motion.  Lymphadenopathy:    She has no cervical adenopathy.  Neurological: She is alert and oriented to person, place, and time. She exhibits normal muscle tone. Coordination normal.  Skin: Skin is warm. Rash noted.  Mild, erythematous slightly raised rash to bilateral inner thighs.  No edema.    Psychiatric: She has a normal mood and affect.  Nursing note and vitals reviewed.   ED Course  Procedures (including critical care time) Labs Review Labs Reviewed  URINALYSIS, ROUTINE W REFLEX MICROSCOPIC (NOT AT [facility])  POC URINE PREG, ED    Imaging Review No results found.   EKG Interpretation None      MDM   Final diagnoses:  Allergic reaction, initial encounter  Urinary tract infection, chronic   Pt has been seen here multiple times for UTI.  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    Urine culture from 03/31/15 showed E. Coli. With resistance to Ampicillin and Cipro, sensitive to cephalosporins.  Will tx with keflex, prednisone taper and benadryl.  Advised to make arrangements to f/u with [facility].  Pt appears stable for d/c    Pauline Aus, PA-C 04/12/15 0941  Vanetta Mulders, MD 04/13/15 [phone]

## 2015-04-11 NOTE — ED Notes (Signed)
Pt c/o of increased generalized itching. PA Tammy Triplett notified.

## 2015-04-11 NOTE — ED Notes (Addendum)
Per EMS, pt from home called out for allergic reaction with generalized, itchy rash about 30 minutes ago. Pt denies SOB or breathing difficulties. Airway patent. Pt given 50mg  Benadryl IV PTA. Symptoms resolving. Pt treated in [facility] ED for UTI and started on Bactrim last week.

## 2015-04-11 NOTE — ED Notes (Signed)
PA Tammy Triplett at bedside. 

## 2015-04-12 ENCOUNTER — Emergency Department (HOSPITAL_COMMUNITY)
Admission: EM | Admit: 2015-04-12 | Discharge: 2015-04-12 | Disposition: A | Discharge status: Home / Self Care | Payer: Medicare Other | Source: Home / Self Care | Attending: Emergency Medicine | Admitting: Emergency Medicine

## 2015-04-12 ENCOUNTER — Encounter (HOSPITAL_COMMUNITY): Payer: Self-pay | Admitting: Emergency Medicine

## 2015-04-12 DIAGNOSIS — E119 Type 2 diabetes mellitus without complications: Secondary | ICD-10-CM | POA: Insufficient documentation

## 2015-04-12 DIAGNOSIS — M797 Fibromyalgia: Secondary | ICD-10-CM

## 2015-04-12 DIAGNOSIS — I10 Essential (primary) hypertension: Secondary | ICD-10-CM | POA: Insufficient documentation

## 2015-04-12 DIAGNOSIS — Z792 Long term (current) use of antibiotics: Secondary | ICD-10-CM

## 2015-04-12 DIAGNOSIS — M545 Low back pain: Secondary | ICD-10-CM

## 2015-04-12 DIAGNOSIS — Z880 Allergy status to penicillin: Secondary | ICD-10-CM | POA: Insufficient documentation

## 2015-04-12 DIAGNOSIS — R300 Dysuria: Secondary | ICD-10-CM | POA: Insufficient documentation

## 2015-04-12 DIAGNOSIS — T7840XA Allergy, unspecified, initial encounter: Secondary | ICD-10-CM

## 2015-04-12 DIAGNOSIS — G8929 Other chronic pain: Secondary | ICD-10-CM | POA: Insufficient documentation

## 2015-04-12 DIAGNOSIS — R21 Rash and other nonspecific skin eruption: Secondary | ICD-10-CM | POA: Insufficient documentation

## 2015-04-12 DIAGNOSIS — T361X5A Adverse effect of cephalosporins and other beta-lactam antibiotics, initial encounter: Secondary | ICD-10-CM

## 2015-04-12 DIAGNOSIS — M069 Rheumatoid arthritis, unspecified: Secondary | ICD-10-CM

## 2015-04-12 LAB — CBG MONITORING, ED: Glucose-Capillary: 105 mg/dL — ABNORMAL HIGH (ref 65–99)

## 2015-04-12 MED ORDER — ACETAMINOPHEN 325 MG PO TABS
975.0000 mg | ORAL_TABLET | Freq: Once | ORAL | Status: AC
  Administered 2015-04-12: 975 mg via ORAL
  Filled 2015-04-12: qty 3

## 2015-04-12 MED ORDER — NITROFURANTOIN MONOHYD MACRO 100 MG PO CAPS: 100.0000 mg | ORAL_CAPSULE | Freq: Two times a day (BID) | ORAL | Status: DC

## 2015-04-12 NOTE — ED Notes (Addendum)
Patient complains of questionable allergic rxn to keflex taken last night at 10 pm. Airway is patent, lung sounds clear, and NAD noted. Patient C/O itching on left arm.

## 2015-04-12 NOTE — ED Notes (Signed)
Pt states that she was here yesterday for allergic reaction to bactrim and was started on Keflex and woke up this morning itching with rash.

## 2015-04-12 NOTE — ED Provider Notes (Addendum)
CSN: [id number]     Arrival date & time 04/12/15  0719 History   First MD Initiated Contact with Patient 04/12/15 [phone]     Chief Complaint  Patient presents with  . Allergic Reaction     (Consider location/radiation/quality/duration/timing/severity/associated sxs/prior Treatment) HPI Patient with presents with pruritic rash which started yesterday, a recurrence of rash that she's had in the past from allergic reactions for medications. She was started on Keflex yesterday as result of urinary tract infection. She was also started on prednisone and Benadryl. She was taken off of Bactrim as she developed a rash from Bactrim. She stated the rash had improved. She denies shortness of breath no other symptoms treated with Benadryl 1.5 hours prior to coming here for rash. Continues to itch. No other associated symptoms.  Past Medical History  Diagnosis Date  . Hypertension   . Chronic back pain   . Sciatic pain     right  . Rib pain on right side     chronic  . Chronic neck pain   . Cervical radiculopathy   . Chronic knee pain   . Rheumatoid arthritis     "Rhematoid factor positive but no symptoms" per EPIC notes 2012  . Fibromyalgia   . Diabetes mellitus without complication    Past Surgical History  Procedure Laterality Date  . Tubal ligation    . Bladder surgery     History reviewed. No pertinent family history. History  Substance Use Topics  . Smoking status: Never Smoker   . Smokeless tobacco: Not on file  . Alcohol Use: No   OB History    Gravida Para Term Preterm AB TAB SAB Ectopic Multiple Living            3     Review of Systems  Genitourinary: Positive for dysuria.  Musculoskeletal: Positive for back pain.  Skin: Positive for rash.  Allergic/Immunologic: Positive for immunocompromised state.       Diet controlled diabetic patient reports diagnosed with diabetes 2 months ago  All other systems reviewed and are negative.     Allergies  Mango flavor;  Penicillins; Tramadol; Bactrim; and Toradol  Home Medications   Prior to Admission medications   Medication Sig Start Date End Date Taking? Authorizing Provider  acetaminophen (TYLENOL) 500 MG tablet Take 1,000 mg by mouth every 6 (six) hours as needed for mild pain.     Historical Provider, MD  cephALEXin (KEFLEX) 500 MG capsule Take 1 capsule (500 mg total) by mouth 4 (four) times daily. 04/11/15   Tammy Triplett, PA-C  diphenhydrAMINE (BENADRYL) 25 MG tablet Take 1 tablet (25 mg total) by mouth every 4 (four) hours as needed for itching. 04/11/15   Tammy Triplett, PA-C  lisinopril-hydrochlorothiazide (PRINZIDE,ZESTORETIC) 20-25 MG per tablet Take 2 tablets by mouth every morning.     Historical Provider, MD  oxyCODONE-acetaminophen (PERCOCET/ROXICET) 5-325 MG per tablet Take 1 tablet by mouth every 4 (four) hours as needed for moderate pain. Patient not taking: Reported on 04/11/2015 04/07/15   Erick Blinks, MD  predniSONE (DELTASONE) 10 MG tablet Take 6 tablets day one, 5 tablets day two, 4 tablets day three, 3 tablets day four, 2 tablets day five, then 1 tablet day six 04/11/15   Tammy Triplett, PA-C  venlafaxine XR (EFFEXOR-XR) 75 MG 24 hr capsule Take 225 mg by mouth at bedtime.     Historical Provider, MD   BP 153/82 mmHg  Pulse 99  Temp(Src) 97.8 F (36.6 C) (Oral)  Resp 18  Ht 5' (1.524 m)  Wt 198 lb (89.812 kg)  BMI 38.67 kg/m2  SpO2 100%  LMP 03/18/2015 Physical Exam  Constitutional: She is oriented to person, place, and time. She appears well-developed and well-nourished. No distress.  HENT:  Head: Normocephalic and atraumatic.  No mucosal lesion  Eyes: Conjunctivae are normal. Pupils are equal, round, and reactive to light.  Neck: Neck supple. No tracheal deviation present. No thyromegaly present.  Cardiovascular: Normal rate and regular rhythm.   No murmur heard. Pulmonary/Chest: Effort normal and breath sounds normal.  Abdominal: Soft. Bowel sounds are normal. She exhibits  no distension. There is no tenderness.  Musculoskeletal: Normal range of motion. She exhibits no edema or tenderness.  Neurological: She is alert and oriented to person, place, and time. No cranial nerve deficit. Coordination normal.  Skin: Skin is warm and dry. Rash noted.  Hive-like pruritic rash on trunk and extremities.  Psychiatric: She has a normal mood and affect.  Nursing note and vitals reviewed.   ED Course  Procedures (including critical care time) Labs Review Labs Reviewed  CBG MONITORING, ED    Imaging Review No results found.   EKG Interpretation None       Results for orders placed or performed during the hospital encounter of 04/12/15  POC CBG, ED  Result Value Ref Range   Glucose-Capillary 105 (H) 65 - 99 mg/dL   Dg Chest 2 View  6/0/6301   CLINICAL DATA:  Cough, back pain.  EXAM: CHEST  2 VIEW  COMPARISON:  February 23, 2014.  FINDINGS: The heart size and mediastinal contours are within normal limits. Both lungs are clear. No pneumothorax or pleural effusion is noted. The visualized skeletal structures are unremarkable.  IMPRESSION: No active cardiopulmonary disease.   Electronically Signed   By: Lupita Raider, M.D.   On: 03/16/2015 14:21   Dg Abd 1 View  03/16/2015   CLINICAL DATA:  Nausea/vomiting, right upper quadrant pain  EXAM: ABDOMEN - 1 VIEW  COMPARISON:  CT abdomen pelvis dated 02/23/2015  FINDINGS: Nonobstructive bowel gas pattern.  Moderate stool in the cecum/ascending colon.  Calcified pelvic phleboliths.  Visualized osseous structures are within normal limits.  IMPRESSION: Nonobstructive bowel gas pattern.  Moderate stool in the cecum/ascending colon.   Electronically Signed   By: Charline Bills M.D.   On: 03/16/2015 14:20   Ct Abdomen Pelvis W Contrast  03/31/2015   CLINICAL DATA:  Bilateral flank pain and dysuria for 1 week  EXAM: CT ABDOMEN AND PELVIS WITH CONTRAST  TECHNIQUE: Multidetector CT imaging of the abdomen and pelvis was performed using  the standard protocol following bolus administration of intravenous contrast.  CONTRAST:  OMNIPAQUE IOHEXOL 300 MG/ML  SOLN  COMPARISON:  February 23, 2015  FINDINGS: Lung bases are clear.  No focal liver lesions are identified. The gallbladder wall is not appreciably thickened. There is no biliary duct dilatation.  Spleen, pancreas, and adrenals appear normal.  There is a cyst in the posterior lower pole left kidney measuring 1.5 x 1.5 cm. There is no other renal mass. There is no inflammatory focus in either kidney. There is no hydronephrosis. There is an extrarenal pelvis on each side, an anatomic variant. There is no renal or ureteral calculus on either side.  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 They are best seen on axial slices 91, 92, and 93 series 2 as well as on coronal slice is 46,  47, and 48 series 3. Urinary bladder is midline with normal wall thickness. There is no pelvic mass or pelvic fluid collection. Appendix appears normal.  There is no bowel obstruction. No free air or portal venous air. There is no demonstrable ascites, adenopathy, or abscess in the abdomen or pelvis. Aorta shows no evidence of aneurysm. There are no blastic or lytic bone lesions.  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 Infection within these diverticula cannot be excluded.  There is no evidence of inflammation in either kidney. No hydronephrosis on either side. No renal or ureteral calculi.  No bowel obstruction.  No abscess.  Appendix appears normal.   Electronically Signed   By: Bretta Bang III M.D.   On: 03/31/2015 18:27     MDM   plan prescription Macrobid. Urine sent for culture. Last urine culture suggested re-collection.. Culture from 03/31/2015 showed sensitivity to Macrobid Continue Benadryl and prednisone. Keep scheduled follow-up appointment with [facility]. She reports that she has an appointment in October however will  be called if an appointment opens up sooner  Final diagnoses:  None    diagnoses # allergic reaction #2 urinary tract infection     Doug Sou, MD 04/12/15 1779  Doug Sou, MD 04/12/15 [id number]

## 2015-04-12 NOTE — Discharge Instructions (Signed)
Drug Allergy Discontinue Keflex (cephalexin). Continue to take prednisone as prescribed and Benadryl 50 mg every 6 hours as needed for itch. Take the Macrobid as prescribed. Call [facility] today to see if your appointment can be moved up. Return if your condition worsens for any reason.  Allergic reactions to medicines are common. Some allergic reactions are mild. A delayed type of drug allergy that occurs 1 week or more after exposure to a medicine or vaccine is called serum sickness. A life-threatening, sudden (acute) allergic reaction that involves the whole body is called anaphylaxis. CAUSES  "True" drug allergies occur when there is an allergic reaction to a medicine. This is caused by overactivity of the immune system. First, the body becomes sensitized. The immune system is triggered by your first exposure to the medicine. Following this first exposure, future exposure to the same medicine may be life-threatening. Almost any medicine can cause an allergic reaction. Common ones are:  Penicillin.  Sulfonamides (sulfa drugs).  Local anesthetics.  X-ray dyes that contain iodine. SYMPTOMS  Common symptoms of a minor allergic reaction are:  Swelling around the mouth.  An itchy red rash or hives.  Vomiting or diarrhea. Anaphylaxis can cause swelling of the mouth and throat. This makes it difficult to breathe and swallow. Severe reactions can be fatal within seconds, even after exposure to only a trace amount of the drug that causes the reaction. HOME CARE INSTRUCTIONS   If you are unsure of what caused your reaction, keep a diary of foods and medicines used. Include the symptoms that followed. Avoid anything that causes reactions.  You may want to follow up with an allergy specialist after the reaction has cleared in order to be tested to confirm the allergy. It is important to confirm that your reaction is an allergy, not just a side effect to the medicine. If you have a true  allergy to a medicine, this may prevent that medicine and related medicines from being given to you when you are very ill.  If you have hives or a rash:  Take medicines as directed by your caregiver.  You may use an over-the-counter antihistamine (diphenhydramine) as needed.  Apply cold compresses to the skin or take baths in cool water. Avoid hot baths or showers.  If you are severely allergic:  Continuous observation after a severe reaction may be needed. Hospitalization is often required.  Wear a medical alert bracelet or necklace stating your allergy.  You and your family must learn how to use an anaphylaxis kit or give an epinephrine injection to temporarily treat an emergency allergic reaction. If you have had a severe reaction, always carry your epinephrine injection or anaphylaxis kit with you. This can be lifesaving if you have a severe reaction.  Do not drive or perform tasks after treatment until the medicines used to treat your reaction have worn off, or until your caregiver says it is okay. SEEK MEDICAL CARE IF:   You think you had an allergic reaction. Symptoms usually start within 30 minutes after exposure.  Symptoms are getting worse rather than better.  You develop new symptoms.  The symptoms that brought you to your caregiver return. SEEK IMMEDIATE MEDICAL CARE IF:   You have swelling of the mouth, difficulty breathing, or wheezing.  You have a tight feeling in your chest or throat.  You develop hives, swelling, or itching all over your body.  You develop severe vomiting or diarrhea.  You feel faint or pass out. This is  an emergency. Use your epinephrine injection or anaphylaxis kit as you have been instructed. Call for emergency medical help. Even if you improve after the injection, you need to be examined at a hospital emergency department. MAKE SURE YOU:   Understand these instructions.  Will watch your condition.  Will get help right away if you are  not doing well or get worse. Document Released: 08/24/2005 Document Revised: 11/16/2011 Document Reviewed: 01/28/2011 [facility] Patient Information 2015 [place], [place]. This information is not intended to replace advice given to you by your health care provider. Make sure you discuss any questions you have with your health care provider.

## 2015-04-13 ENCOUNTER — Encounter (HOSPITAL_COMMUNITY): Payer: Self-pay

## 2015-04-13 ENCOUNTER — Inpatient Hospital Stay (HOSPITAL_COMMUNITY)
Admission: EM | Admit: 2015-04-13 | Discharge: 2015-04-22 | DRG: 690 | Disposition: A | Discharge status: Home / Self Care | Payer: Medicare Other | Attending: Internal Medicine | Admitting: Internal Medicine

## 2015-04-13 DIAGNOSIS — N12 Tubulo-interstitial nephritis, not specified as acute or chronic: Principal | ICD-10-CM | POA: Diagnosis present

## 2015-04-13 DIAGNOSIS — R109 Unspecified abdominal pain: Secondary | ICD-10-CM

## 2015-04-13 DIAGNOSIS — A0472 Enterocolitis due to Clostridium difficile, not specified as recurrent: Secondary | ICD-10-CM | POA: Diagnosis present

## 2015-04-13 DIAGNOSIS — M5412 Radiculopathy, cervical region: Secondary | ICD-10-CM | POA: Diagnosis present

## 2015-04-13 DIAGNOSIS — Z880 Allergy status to penicillin: Secondary | ICD-10-CM

## 2015-04-13 DIAGNOSIS — A047 Enterocolitis due to Clostridium difficile: Secondary | ICD-10-CM | POA: Diagnosis not present

## 2015-04-13 DIAGNOSIS — G8929 Other chronic pain: Secondary | ICD-10-CM | POA: Diagnosis present

## 2015-04-13 DIAGNOSIS — M797 Fibromyalgia: Secondary | ICD-10-CM | POA: Diagnosis present

## 2015-04-13 DIAGNOSIS — M069 Rheumatoid arthritis, unspecified: Secondary | ICD-10-CM | POA: Diagnosis present

## 2015-04-13 DIAGNOSIS — R0781 Pleurodynia: Secondary | ICD-10-CM | POA: Diagnosis present

## 2015-04-13 DIAGNOSIS — M542 Cervicalgia: Secondary | ICD-10-CM | POA: Diagnosis present

## 2015-04-13 DIAGNOSIS — E119 Type 2 diabetes mellitus without complications: Secondary | ICD-10-CM | POA: Diagnosis present

## 2015-04-13 DIAGNOSIS — Z885 Allergy status to narcotic agent status: Secondary | ICD-10-CM

## 2015-04-13 DIAGNOSIS — N323 Diverticulum of bladder: Secondary | ICD-10-CM | POA: Diagnosis present

## 2015-04-13 DIAGNOSIS — M549 Dorsalgia, unspecified: Secondary | ICD-10-CM | POA: Diagnosis present

## 2015-04-13 DIAGNOSIS — M25569 Pain in unspecified knee: Secondary | ICD-10-CM | POA: Diagnosis present

## 2015-04-13 DIAGNOSIS — Z882 Allergy status to sulfonamides status: Secondary | ICD-10-CM

## 2015-04-13 DIAGNOSIS — N390 Urinary tract infection, site not specified: Secondary | ICD-10-CM | POA: Diagnosis present

## 2015-04-13 DIAGNOSIS — Z881 Allergy status to other antibiotic agents status: Secondary | ICD-10-CM

## 2015-04-13 DIAGNOSIS — I10 Essential (primary) hypertension: Secondary | ICD-10-CM | POA: Diagnosis present

## 2015-04-13 LAB — URINALYSIS, ROUTINE W REFLEX MICROSCOPIC
BILIRUBIN URINE: NEGATIVE
Bilirubin Urine: NEGATIVE
Glucose, UA: NEGATIVE mg/dL
Glucose, UA: NEGATIVE mg/dL
Hgb urine dipstick: NEGATIVE
KETONES UR: NEGATIVE mg/dL
Ketones, ur: NEGATIVE mg/dL
Nitrite: NEGATIVE
Nitrite: NEGATIVE
Protein, ur: NEGATIVE mg/dL
Protein, ur: 30 mg/dL — AB
SPECIFIC GRAVITY, URINE: 1.02 (ref 1.005–1.030)
SPECIFIC GRAVITY, URINE: 1.015 (ref 1.005–1.030)
UROBILINOGEN UA: 0.2 mg/dL (ref 0.0–1.0)
Urobilinogen, UA: 0.2 mg/dL (ref 0.0–1.0)
pH: 5.5 (ref 5.0–8.0)
pH: 7 (ref 5.0–8.0)

## 2015-04-13 LAB — COMPREHENSIVE METABOLIC PANEL
ALT: 12 U/L — ABNORMAL LOW (ref 14–54)
AST: 14 U/L — ABNORMAL LOW (ref 15–41)
Albumin: 3.9 g/dL (ref 3.5–5.0)
Alkaline Phosphatase: 28 U/L — ABNORMAL LOW (ref 38–126)
Anion gap: 8 (ref 5–15)
BUN: 10 mg/dL (ref 6–20)
CALCIUM: 9 mg/dL (ref 8.9–10.3)
CO2: 26 mmol/L (ref 22–32)
Chloride: 103 mmol/L (ref 101–111)
Creatinine, Ser: 0.86 mg/dL (ref 0.44–1.00)
GFR calc Af Amer: >60 mL/min (ref >60)
GFR calc non Af Amer: >60 mL/min (ref >60)
Glucose, Bld: 91 mg/dL (ref 65–99)
Potassium: 3.7 mmol/L (ref 3.5–5.1)
SODIUM: 137 mmol/L (ref 135–145)
TOTAL PROTEIN: 7.2 g/dL (ref 6.5–8.1)
Total Bilirubin: 0.5 mg/dL (ref 0.3–1.2)

## 2015-04-13 LAB — URINE MICROSCOPIC-ADD ON

## 2015-04-13 LAB — CBC WITH DIFFERENTIAL/PLATELET
Basophils Absolute: 0 10*3/uL (ref 0.0–0.1)
Basophils Relative: 0 % (ref 0–1)
EOS ABS: 0.1 10*3/uL (ref 0.0–0.7)
EOS PCT: 1 % (ref 0–5)
HCT: 39 % (ref 36.0–46.0)
Hemoglobin: 13.2 g/dL (ref 12.0–15.0)
Lymphocytes Relative: 38 % (ref 12–46)
Lymphs Abs: 3.2 10*3/uL (ref 0.7–4.0)
MCH: 30.1 pg (ref 26.0–34.0)
MCHC: 33.8 g/dL (ref 30.0–36.0)
MCV: 89 fL (ref 78.0–100.0)
MONO ABS: 0.2 10*3/uL (ref 0.1–1.0)
MONOS PCT: 3 % (ref 3–12)
NEUTROS ABS: 4.9 10*3/uL (ref 1.7–7.7)
Neutrophils Relative %: 58 % (ref 43–77)
Platelets: 315 10*3/uL (ref 150–400)
RBC: 4.38 MIL/uL (ref 3.87–5.11)
RDW: 13.6 % (ref 11.5–15.5)
WBC: 8.4 10*3/uL (ref 4.0–10.5)

## 2015-04-13 LAB — POC URINE PREG, ED: PREG TEST UR: NEGATIVE

## 2015-04-13 LAB — LIPASE, BLOOD: LIPASE: 32 U/L (ref 22–51)

## 2015-04-13 MED ORDER — VENLAFAXINE HCL ER 150 MG PO CP24
225.0000 mg | ORAL_CAPSULE | Freq: Every day | ORAL | Status: DC
Start: 2015-04-13 — End: 2015-04-22
  Administered 2015-04-13 – 2015-04-21 (×9): 225 mg via ORAL
  Filled 2015-04-13 (×10): qty 1

## 2015-04-13 MED ORDER — SODIUM CHLORIDE 0.9 % IV SOLN
INTRAVENOUS | Status: DC
  Administered 2015-04-13 – 2015-04-22 (×15): via INTRAVENOUS

## 2015-04-13 MED ORDER — SODIUM CHLORIDE 0.9 % IV SOLN
500.0000 mg | Freq: Four times a day (QID) | INTRAVENOUS | Status: DC
  Administered 2015-04-13 – 2015-04-14 (×3): 500 mg via INTRAVENOUS
  Filled 2015-04-13 (×5): qty 500

## 2015-04-13 MED ORDER — ONDANSETRON HCL 4 MG/2ML IJ SOLN
4.0000 mg | Freq: Once | INTRAMUSCULAR | Status: AC
  Administered 2015-04-13: 4 mg via INTRAVENOUS
  Filled 2015-04-13: qty 2

## 2015-04-13 MED ORDER — ENOXAPARIN SODIUM 40 MG/0.4ML ~~LOC~~ SOLN
40.0000 mg | SUBCUTANEOUS | Status: DC
  Administered 2015-04-13 – 2015-04-21 (×9): 40 mg via SUBCUTANEOUS
  Filled 2015-04-13 (×10): qty 0.4

## 2015-04-13 MED ORDER — MORPHINE SULFATE 4 MG/ML IJ SOLN
4.0000 mg | Freq: Once | INTRAMUSCULAR | Status: AC
Start: 2015-04-13 — End: 2015-04-13
  Administered 2015-04-13: 4 mg via INTRAVENOUS
  Filled 2015-04-13: qty 1

## 2015-04-13 MED ORDER — MORPHINE SULFATE 2 MG/ML IJ SOLN
2.0000 mg | INTRAMUSCULAR | Status: DC | PRN
  Administered 2015-04-13 – 2015-04-15 (×8): 2 mg via INTRAVENOUS
  Filled 2015-04-13 (×8): qty 1

## 2015-04-13 MED ORDER — LISINOPRIL-HYDROCHLOROTHIAZIDE 20-25 MG PO TABS: 2.0000 | ORAL_TABLET | Freq: Every morning | ORAL | Status: DC

## 2015-04-13 MED ORDER — ONDANSETRON HCL 4 MG/2ML IJ SOLN
4.0000 mg | Freq: Four times a day (QID) | INTRAMUSCULAR | Status: DC | PRN
  Administered 2015-04-14 – 2015-04-17 (×3): 4 mg via INTRAVENOUS
  Filled 2015-04-13 (×2): qty 2

## 2015-04-13 MED ORDER — HYDROMORPHONE HCL 1 MG/ML IJ SOLN
1.0000 mg | Freq: Once | INTRAMUSCULAR | Status: AC
  Administered 2015-04-13: 1 mg via INTRAVENOUS
  Filled 2015-04-13: qty 1

## 2015-04-13 MED ORDER — LISINOPRIL 40 MG PO TABS
40.0000 mg | ORAL_TABLET | Freq: Every day | ORAL | Status: DC
  Administered 2015-04-14 – 2015-04-22 (×9): 40 mg via ORAL
  Filled 2015-04-13 (×9): qty 1

## 2015-04-13 MED ORDER — HYDROCHLOROTHIAZIDE 50 MG PO TABS
50.0000 mg | ORAL_TABLET | Freq: Every day | ORAL | Status: DC
  Administered 2015-04-14 – 2015-04-22 (×9): 50 mg via ORAL
  Filled 2015-04-13 (×10): qty 1

## 2015-04-13 NOTE — ED Notes (Signed)
Pt c/o dysuria and bilateral flank pain x 1 month and nausea starting today.  Pain score 10/10.  Pt reports taking Tylenol w/o relief.  Pt reports several recent allergic reactions to antibiotics.  Pt has been seen several times at [facility] for same.  Sts Hx of urethral diverticula.

## 2015-04-13 NOTE — ED Notes (Signed)
Pt ambulatory to restroom without assistance 

## 2015-04-13 NOTE — ED Provider Notes (Signed)
CSN: [id number]     Arrival date & time 04/13/15  1343 History   First MD Initiated Contact with Patient 04/13/15 1510     Chief Complaint  Patient presents with  . Dysuria  . Flank Pain     (Consider location/radiation/quality/duration/timing/severity/associated sxs/prior Treatment) HPI  Regina Patton Is a 44 year old female who presents emergency Department with chief complaint of right flank pain, urinary symptoms. The patient was diagnosed with pyelonephritis back in July. At that time, her urine culture showed Escherichia coli with resistance to ampicillin and Cipro. The patient was given Bactrim to which she had an allergic reaction twice. Patient was seen yesterday after she was switched to Keflex and had an acute allergic reaction to as well. 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 The patient also was admitted and given Rocephin previously. Patient states she continues to have fever up to 102 this morning. She states took Tylenol with resolution. She is continued pain, nausea, vomiting, urinary urgency and frequency. She denies abdominal pain. Chest pain or shortness of breath.  Past Medical History  Diagnosis Date  . Hypertension   . Chronic back pain   . Sciatic pain     right  . Rib pain on right side     chronic  . Chronic neck pain   . Cervical radiculopathy   . Chronic knee pain   . Rheumatoid arthritis     "Rhematoid factor positive but no symptoms" per EPIC notes 2012  . Fibromyalgia   . Diabetes mellitus without complication    Past Surgical History  Procedure Laterality Date  . Tubal ligation    . Bladder surgery     History reviewed. No pertinent family history. History  Substance Use Topics  . Smoking status: Never Smoker   . Smokeless tobacco: Not on file  . Alcohol Use: No   OB History    Gravida Para Term Preterm AB TAB SAB Ectopic Multiple Living            3     Review of Systems  Ten systems reviewed and are negative for acute  change, except as noted in the HPI.    Allergies  Mango flavor; Penicillins; Tramadol; Bactrim; Keflex; and Toradol  Home Medications   Prior to Admission medications   Medication Sig Start Date End Date Taking? Authorizing Provider  acetaminophen (TYLENOL) 500 MG tablet Take 1,000 mg by mouth every 6 (six) hours as needed for fever.    Yes Historical Provider, MD  diphenhydrAMINE (BENADRYL) 25 MG tablet Take 1 tablet (25 mg total) by mouth every 4 (four) hours as needed for itching. Patient taking differently: Take 50 mg by mouth every 4 (four) hours as needed for itching.  04/11/15  Yes Tammy Triplett, PA-C  lisinopril-hydrochlorothiazide (PRINZIDE,ZESTORETIC) 20-25 MG per tablet Take 2 tablets by mouth every morning.    Yes Historical Provider, MD  cephALEXin (KEFLEX) 500 MG capsule Take 1 capsule (500 mg total) by mouth 4 (four) times daily. 04/11/15   Tammy Triplett, PA-C  nitrofurantoin, macrocrystal-monohydrate, (MACROBID) 100 MG capsule Take 1 capsule (100 mg total) by mouth 2 (two) times daily. X 7 days 04/12/15   Doug Sou, MD  oxyCODONE-acetaminophen (PERCOCET/ROXICET) 5-325 MG per tablet Take 1 tablet by mouth every 4 (four) hours as needed for moderate pain. Patient not taking: Reported on 04/11/2015 04/07/15   Erick Blinks, MD  predniSONE (DELTASONE) 10 MG tablet Take 6 tablets day one, 5 tablets day two, 4 tablets  day three, 3 tablets day four, 2 tablets day five, then 1 tablet day six 04/11/15   Tammy Triplett, PA-C  venlafaxine XR (EFFEXOR-XR) 75 MG 24 hr capsule Take 225 mg by mouth at bedtime.     Historical Provider, MD   BP 118/69 mmHg  Pulse 93  Temp(Src) 98.3 F (36.8 C) (Oral)  Resp 18  SpO2 97%  LMP 03/18/2015 Physical Exam  Constitutional: She is oriented to person, place, and time. She appears well-developed and well-nourished. No distress.  HENT:  Head: Normocephalic and atraumatic.  Eyes: Conjunctivae are normal. No scleral icterus.  Neck: Normal range of  motion.  Cardiovascular: Normal rate, regular rhythm and normal heart sounds.  Exam reveals no gallop and no friction rub.   No murmur heard. Pulmonary/Chest: Effort normal and breath sounds normal. No respiratory distress.  Abdominal: Soft. Bowel sounds are normal. She exhibits no distension and no mass. There is tenderness. There is no guarding.  Positive CVA tenderness bilaterally, worse on the right. Tenderness in the suprapubic region  Neurological: She is alert and oriented to person, place, and time.  Skin: Skin is warm and dry. She is not diaphoretic.  Nursing note and vitals reviewed.   ED Course  Procedures (including critical care time) Labs Review Labs Reviewed  URINALYSIS, ROUTINE W REFLEX MICROSCOPIC (NOT AT [facility]) - Abnormal; Notable for the following:    APPearance CLOUDY (*)    Leukocytes, UA MODERATE (*)    All other components within normal limits  URINE MICROSCOPIC-ADD ON - Abnormal; Notable for the following:    Squamous Epithelial / LPF FEW (*)    All other components within normal limits  COMPREHENSIVE METABOLIC PANEL - Abnormal; Notable for the following:    AST 14 (*)    ALT 12 (*)    Alkaline Phosphatase 28 (*)    All other components within normal limits  CBC WITH DIFFERENTIAL/PLATELET  LIPASE, BLOOD  URINALYSIS, ROUTINE W REFLEX MICROSCOPIC (NOT AT [facility])  POC URINE PREG, ED    Imaging Review No results found.   EKG Interpretation None      MDM   Final diagnoses:  None    8:26 PM BP 136/76 mmHg  Pulse 86  Temp(Src) 98.2 F (36.8 C) (Oral)  Resp 16  SpO2 98%  LMP 03/18/2015 Patient with UTi, failed abx with bactrim, keflex, rocephin Will need admission. Dr. Ninfa Linden to admit. Labs otherwise reassuring.    Arthor Captain, PA-C 04/13/15 2028  Richardean Canal, MD 04/13/15 [phone]

## 2015-04-13 NOTE — H&P (Signed)
History and Physical  Regina Patton:[id number] DOB: 1971/07/29 DOA: 04/13/2015  PCP: Kaleen Mask, MD   Chief Complaint: Flank pain /N/V  HPI:  Regina Patton is a 44 year old female with history of recent UTI diagnosed last month who comes back for the third time due to continuous symptoms of flank/back pain with nausea and vomiting. She said this has been going on since last month when she was diagnosed with UTI. Her pain is worse in the right side. Her oral intake has deteriorated. She also has dysuria, urgency, and frequency with no hematuria or vaginal discharge. She had a fever of 102 and had chills over the last few days. She already tried ciprofloxacin for 7 days and bactrim for 6 days with no benefit. She had rash ( likely delayed type reaction ) with bactrim on the 5th day. She is allergic to keflex as she had hives/angioedema/tightness after the second dose. She tolerated a dose of Rocephin in the hospital , however.   Review of Systems:  CONSTITUTIONAL:  No night sweats.  No fatigue, malaise, lethargy.  fever or chills.  Eyes:  No visual changes.  No eye pain.  No eye discharge.   ENT:  No runny nose.  No epistaxis.  No sinus pain.  No sore throat.  No odynophagia.  No ear pain.  No congestion. RESPIRATORY:  No cough.  No wheeze.  No hemoptysis.  No shortness of breath. CARDIOVASCULAR:  No chest pains.  No palpitations. GASTROINTESTINAL:  abdominal pain.  nausea or vomiting.  No diarrhea or constipation.  No hematemesis.  No hematochezia.  No melena. GENITOURINARY:  urgency.  frequency.  dysuria.  No hematuria.  No obstructive symptoms.  No discharge.  No pain.  No significant abnormal bleeding. MUSCULOSKELETAL:  No musculoskeletal pain.  No joint swelling.  No arthritis. NEUROLOGICAL:  He had an episode of mild confusion.  He has chronic right hemiplegia.  No headache or neck pain.  No syncope or seizure. SKIN:  No rashes.  No lesions.  No wounds. ENDOCRINE:  No unexplained  weight loss.  No polydipsia.  No polyuria.  No polyphagia. HEMATOLOGIC:  No anemia.  No purpura.  No petechiae.  No prolonged or excessive bleeding.  ALLERGIC AND IMMUNOLOGIC:  No pruritus.  No swelling Other:  Past Medical History  Diagnosis Date  . Hypertension   . Chronic back pain   . Sciatic pain     right  . Rib pain on right side     chronic  . Chronic neck pain   . Cervical radiculopathy   . Chronic knee pain   . Rheumatoid arthritis     "Rhematoid factor positive but no symptoms" per EPIC notes 2012  . Fibromyalgia   . Diabetes mellitus without complication    Past Surgical History  Procedure Laterality Date  . Tubal ligation    . Bladder surgery     Social History:  reports that she has never smoked. She does not have any smokeless tobacco history on file. She reports that she does not drink alcohol or use illicit drugs.  Allergies  Allergen Reactions  . Mango Flavor Anaphylaxis and Swelling    Lips swelling  . Penicillins Anaphylaxis and Hives  . Tramadol Nausea And Vomiting  . Bactrim [Sulfamethoxazole-Trimethoprim] Itching and Rash  . Keflex [Cephalexin] Hives, Itching and Rash  . Toradol [Ketorolac Tromethamine] Rash    FH: Mom had kidney cancer.   Prior to Admission medications   Medication Sig Start  Date End Date Taking? Authorizing Provider  acetaminophen (TYLENOL) 500 MG tablet Take 1,000 mg by mouth every 6 (six) hours as needed for fever.    Yes Historical Provider, MD  diphenhydrAMINE (BENADRYL) 25 MG tablet Take 1 tablet (25 mg total) by mouth every 4 (four) hours as needed for itching. Patient taking differently: Take 50 mg by mouth every 4 (four) hours as needed for itching.  04/11/15  Yes Tammy Triplett, PA-C  lisinopril-hydrochlorothiazide (PRINZIDE,ZESTORETIC) 20-25 MG per tablet Take 2 tablets by mouth every morning.    Yes Historical Provider, MD  cephALEXin (KEFLEX) 500 MG capsule Take 1 capsule (500 mg total) by mouth 4 (four) times daily.  04/11/15   Tammy Triplett, PA-C  nitrofurantoin, macrocrystal-monohydrate, (MACROBID) 100 MG capsule Take 1 capsule (100 mg total) by mouth 2 (two) times daily. X 7 days 04/12/15   Doug Sou, MD  oxyCODONE-acetaminophen (PERCOCET/ROXICET) 5-325 MG per tablet Take 1 tablet by mouth every 4 (four) hours as needed for moderate pain. Patient not taking: Reported on 04/11/2015 04/07/15   Erick Blinks, MD  predniSONE (DELTASONE) 10 MG tablet Take 6 tablets day one, 5 tablets day two, 4 tablets day three, 3 tablets day four, 2 tablets day five, then 1 tablet day six 04/11/15   Tammy Triplett, PA-C  venlafaxine XR (EFFEXOR-XR) 75 MG 24 hr capsule Take 225 mg by mouth at bedtime.     Historical Provider, MD    Physical Exam: BP 123/86 mmHg  Pulse 82  Temp(Src) 97.9 F (36.6 C) (Oral)  Resp 17  SpO2 97%  LMP 03/18/2015  GENERAL : Well developed, well nourished, alert and cooperative, and appears to be in no acute distress. HEAD: normocephalic. EYES: PERRL, EOMI. Fundi normal, vision is grossly intact. NOSE: No nasal discharge. THROAT: Oral cavity and pharynx normal. No inflammation, swelling, exudate, or lesions. Teeth and gingiva in good general condition. NECK: Neck supple CARDIAC: Normal S1 and S2. No S3, S4 or murmurs. Rhythm is regular. There is no peripheral edema, cyanosis or pallor. Extremities are warm and well perfused. Capillary refill is less than 2 seconds. No carotid bruits. LUNGS: Clear to auscultation and percussion without rales, rhonchi, wheezing or diminished breath sounds. ABDOMEN: positive CVA tenderness to palpation bilaterally. Soft, nondistended, nontender. No guarding or rebound. No masses. MUSKULOSKELETAL: Adequately aligned spine. ROM intact spine and extremities. No joint erythema or tenderness. Normal muscular development. Normal gait.  EXTREMITIES: No significant deformity or joint abnormality. No edema. Peripheral pulses intact. No varicosities.  NEUROLOGICAL: CN  II-XII intact. Strength and sensation symmetric and intact throughout. Reflexes 2+ throughout. Cerebellar testing normal. SKIN: Skin normal color, texture and turgor with no lesions or eruptions. PSYCHIATRIC: The mental examination revealed the patient was oriented to person, place, and time. .          Labs on Admission:  Reviewed.   Radiological Exams on Admission: No results found.   Assessment/Plan  Pyelonephritis:  Patient likely has pyelonephritis or complicated UTI Will check renal US ( recent CT abdomen showed no evidence of pyelo but showed uretheral diverticula ) Ucx from 7/24 grew E.coli R to cipro, sensitive to bactrim and ceftriaxone Started on imipenem by the ER. Will continue for now awaiting gram stain in am and Ucx Likely can step down to ceftriaxone ; with allergy to PCN may need fluroquinolone oral as outpatient.  Continue IVF, Zofran prn , PPI IV, diet as tolerated.   HTN: continue lisinopril/HCTZ  DM: diet control  Pain: morphine prn.  DVT prophylaxis: Enoxaparin [place] Code Status: Full Family Communication: at bedside.   Disposition Plan: home once culture result is back /ID consult.    Eston Esters M.D [facility]

## 2015-04-13 NOTE — Progress Notes (Signed)
ANTIBIOTIC CONSULT NOTE - INITIAL  Pharmacy Consult for Primaxin Indication: UTI  Allergies  Allergen Reactions  . Mango Flavor Anaphylaxis and Swelling    Lips swelling  . Penicillins Anaphylaxis and Hives  . Tramadol Nausea And Vomiting  . Bactrim [Sulfamethoxazole-Trimethoprim] Itching and Rash  . Keflex [Cephalexin] Hives, Itching and Rash  . Toradol [Ketorolac Tromethamine] Rash    Patient Measurements:   Adjusted Body Weight:   Vital Signs: Temp: 98.3 F (36.8 C) (08/06 1359) Temp Source: Oral (08/06 1359) BP: 118/69 mmHg (08/06 1359) Pulse Rate: 93 (08/06 1359) Intake/Output from previous day:   Intake/Output from this shift:    Labs:  Recent Labs  04/13/15 1601  WBC 8.4  HGB 13.2  PLT 315  CREATININE 0.86   Estimated Creatinine Clearance: 84.2 mL/min (by C-G formula based on Cr of 0.86). No results for input(s): VANCOTROUGH, VANCOPEAK, VANCORANDOM, GENTTROUGH, GENTPEAK, GENTRANDOM, TOBRATROUGH, TOBRAPEAK, TOBRARND, AMIKACINPEAK, AMIKACINTROU, AMIKACIN in the last 72 hours.   Microbiology: Recent Results (from the past 720 hour(s))  Urine culture     Status: None   Collection Time: 03/23/15  7:13 PM  Result Value Ref Range Status   Specimen Description URINE, CLEAN CATCH  Final   Special Requests NONE  Final   Culture   Final    >=100,000 COLONIES/mL ESCHERICHIA COLI Performed at [facility]    Report Status 03/26/2015 FINAL  Final   Organism ID, Bacteria ESCHERICHIA COLI  Final      Susceptibility   Escherichia coli - MIC*    AMPICILLIN >=32 RESISTANT Resistant     CEFAZOLIN <=4 SENSITIVE Sensitive     CEFTRIAXONE <=1 SENSITIVE Sensitive     CIPROFLOXACIN >=4 RESISTANT Resistant     GENTAMICIN <=1 SENSITIVE Sensitive     IMIPENEM <=0.25 SENSITIVE Sensitive     NITROFURANTOIN <=16 SENSITIVE Sensitive     TRIMETH/SULFA <=20 SENSITIVE Sensitive     AMPICILLIN/SULBACTAM 16 INTERMEDIATE Intermediate     PIP/TAZO <=4 SENSITIVE Sensitive      * >=100,000 COLONIES/mL ESCHERICHIA COLI  Urine culture     Status: None   Collection Time: 03/31/15  3:34 PM  Result Value Ref Range Status   Specimen Description URINE, CLEAN CATCH  Final   Special Requests NONE  Final   Culture   Final    >=100,000 COLONIES/mL ESCHERICHIA COLI Performed at [facility]    Report Status 04/03/2015 FINAL  Final   Organism ID, Bacteria ESCHERICHIA COLI  Final      Susceptibility   Escherichia coli - MIC*    AMPICILLIN >=32 RESISTANT Resistant     CEFAZOLIN <=4 SENSITIVE Sensitive     CEFTRIAXONE <=1 SENSITIVE Sensitive     CIPROFLOXACIN >=4 RESISTANT Resistant     GENTAMICIN <=1 SENSITIVE Sensitive     IMIPENEM <=0.25 SENSITIVE Sensitive     NITROFURANTOIN <=16 SENSITIVE Sensitive     TRIMETH/SULFA <=20 SENSITIVE Sensitive     AMPICILLIN/SULBACTAM 16 INTERMEDIATE Intermediate     PIP/TAZO <=4 SENSITIVE Sensitive     * >=100,000 COLONIES/mL ESCHERICHIA COLI  Urine culture     Status: None   Collection Time: 04/05/15 11:07 AM  Result Value Ref Range Status   Specimen Description URINE, CLEAN CATCH  Final   Special Requests NONE  Final   Culture   Final    MULTIPLE SPECIES PRESENT, SUGGEST RECOLLECTION Performed at [facility]    Report Status 04/07/2015 FINAL  Final  Urine culture  Status: None (Preliminary result)   Collection Time: 04/12/15  7:48 AM  Result Value Ref Range Status   Specimen Description URINE, CLEAN CATCH  Final   Special Requests Immunocompromised  Final   Culture   Final    CULTURE REINCUBATED FOR BETTER GROWTH Performed at [facility]    Report Status PENDING  Incomplete    Medical History: Past Medical History  Diagnosis Date  . Hypertension   . Chronic back pain   . Sciatic pain     right  . Rib pain on right side     chronic  . Chronic neck pain   . Cervical radiculopathy   . Chronic knee pain   . Rheumatoid arthritis     "Rhematoid factor positive but no symptoms" per  EPIC notes 2012  . Fibromyalgia   . Diabetes mellitus without complication     Assessment: 54 yoF diagnosed with pyelonephritis in July for which she was given Bactrim and then subsequently [facility] but has developed allergic reactions to both with rash, itching.  Patient also has a documented history of anaphylaxis to penicillin.  During recent encounter, patient received Rocephin 7/29-7/31.  She presents with continued dysuria, flank pain, and fever.  Her urine culture in July grew E.coli resistance to ampicillin and Cipro, intermediate to Unasyn.  Pharmacy consulted to start Primaxin.  Currently afebrile, WBC WNL, CrCl~95 ml/min (normalized). Collecting another urine culture.  Goal of Therapy:  Doses adjusted per renal function Eradication of infection  Plan:  Primaxin 500 mg IV q6h. F/u SCr, culture results, narrowing of abx.  Clance Boll 04/13/2015,5:57 PM

## 2015-04-14 ENCOUNTER — Observation Stay (HOSPITAL_COMMUNITY): Payer: Medicare Other

## 2015-04-14 DIAGNOSIS — E119 Type 2 diabetes mellitus without complications: Secondary | ICD-10-CM | POA: Diagnosis present

## 2015-04-14 DIAGNOSIS — M069 Rheumatoid arthritis, unspecified: Secondary | ICD-10-CM | POA: Diagnosis present

## 2015-04-14 DIAGNOSIS — R0781 Pleurodynia: Secondary | ICD-10-CM | POA: Diagnosis present

## 2015-04-14 DIAGNOSIS — M25569 Pain in unspecified knee: Secondary | ICD-10-CM | POA: Diagnosis present

## 2015-04-14 DIAGNOSIS — M542 Cervicalgia: Secondary | ICD-10-CM | POA: Diagnosis present

## 2015-04-14 DIAGNOSIS — K529 Noninfective gastroenteritis and colitis, unspecified: Secondary | ICD-10-CM | POA: Diagnosis not present

## 2015-04-14 DIAGNOSIS — M797 Fibromyalgia: Secondary | ICD-10-CM | POA: Diagnosis present

## 2015-04-14 DIAGNOSIS — Z882 Allergy status to sulfonamides status: Secondary | ICD-10-CM | POA: Diagnosis not present

## 2015-04-14 DIAGNOSIS — G8929 Other chronic pain: Secondary | ICD-10-CM | POA: Diagnosis present

## 2015-04-14 DIAGNOSIS — Z881 Allergy status to other antibiotic agents status: Secondary | ICD-10-CM | POA: Diagnosis not present

## 2015-04-14 DIAGNOSIS — R1030 Lower abdominal pain, unspecified: Secondary | ICD-10-CM | POA: Diagnosis not present

## 2015-04-14 DIAGNOSIS — R109 Unspecified abdominal pain: Secondary | ICD-10-CM | POA: Diagnosis not present

## 2015-04-14 DIAGNOSIS — R1032 Left lower quadrant pain: Secondary | ICD-10-CM | POA: Diagnosis not present

## 2015-04-14 DIAGNOSIS — N323 Diverticulum of bladder: Secondary | ICD-10-CM | POA: Diagnosis present

## 2015-04-14 DIAGNOSIS — M549 Dorsalgia, unspecified: Secondary | ICD-10-CM | POA: Diagnosis present

## 2015-04-14 DIAGNOSIS — R112 Nausea with vomiting, unspecified: Secondary | ICD-10-CM | POA: Diagnosis present

## 2015-04-14 DIAGNOSIS — M5412 Radiculopathy, cervical region: Secondary | ICD-10-CM | POA: Diagnosis present

## 2015-04-14 DIAGNOSIS — N12 Tubulo-interstitial nephritis, not specified as acute or chronic: Secondary | ICD-10-CM | POA: Diagnosis present

## 2015-04-14 DIAGNOSIS — N390 Urinary tract infection, site not specified: Secondary | ICD-10-CM | POA: Diagnosis present

## 2015-04-14 DIAGNOSIS — A047 Enterocolitis due to Clostridium difficile: Secondary | ICD-10-CM | POA: Diagnosis not present

## 2015-04-14 DIAGNOSIS — D649 Anemia, unspecified: Secondary | ICD-10-CM | POA: Diagnosis not present

## 2015-04-14 DIAGNOSIS — Z880 Allergy status to penicillin: Secondary | ICD-10-CM | POA: Diagnosis not present

## 2015-04-14 DIAGNOSIS — R110 Nausea: Secondary | ICD-10-CM | POA: Diagnosis not present

## 2015-04-14 DIAGNOSIS — I10 Essential (primary) hypertension: Secondary | ICD-10-CM | POA: Diagnosis present

## 2015-04-14 DIAGNOSIS — Z885 Allergy status to narcotic agent status: Secondary | ICD-10-CM | POA: Diagnosis not present

## 2015-04-14 DIAGNOSIS — Z87440 Personal history of urinary (tract) infections: Secondary | ICD-10-CM | POA: Diagnosis not present

## 2015-04-14 LAB — CBC WITH DIFFERENTIAL/PLATELET
Basophils Absolute: 0 10*3/uL (ref 0.0–0.1)
Basophils Relative: 0 % (ref 0–1)
Eosinophils Absolute: 0.2 10*3/uL (ref 0.0–0.7)
Eosinophils Relative: 2 % (ref 0–5)
HEMATOCRIT: 36.4 % (ref 36.0–46.0)
HEMOGLOBIN: 11.9 g/dL — AB (ref 12.0–15.0)
Lymphocytes Relative: 47 % — ABNORMAL HIGH (ref 12–46)
Lymphs Abs: 4.5 10*3/uL — ABNORMAL HIGH (ref 0.7–4.0)
MCH: 29.2 pg (ref 26.0–34.0)
MCHC: 32.7 g/dL (ref 30.0–36.0)
MCV: 89.2 fL (ref 78.0–100.0)
MONOS PCT: 4 % (ref 3–12)
Monocytes Absolute: 0.4 10*3/uL (ref 0.1–1.0)
NEUTROS PCT: 47 % (ref 43–77)
Neutro Abs: 4.5 10*3/uL (ref 1.7–7.7)
PLATELETS: 257 10*3/uL (ref 150–400)
RBC: 4.08 MIL/uL (ref 3.87–5.11)
RDW: 13.5 % (ref 11.5–15.5)
WBC: 9.6 10*3/uL (ref 4.0–10.5)

## 2015-04-14 LAB — COMPREHENSIVE METABOLIC PANEL
ALT: 13 U/L — ABNORMAL LOW (ref 14–54)
ANION GAP: 6 (ref 5–15)
AST: 17 U/L (ref 15–41)
Albumin: 3.6 g/dL (ref 3.5–5.0)
Alkaline Phosphatase: 25 U/L — ABNORMAL LOW (ref 38–126)
BUN: 10 mg/dL (ref 6–20)
CALCIUM: 8.2 mg/dL — AB (ref 8.9–10.3)
CO2: 27 mmol/L (ref 22–32)
Chloride: 103 mmol/L (ref 101–111)
Creatinine, Ser: 0.82 mg/dL (ref 0.44–1.00)
GFR calc Af Amer: >60 mL/min (ref >60)
Glucose, Bld: 98 mg/dL (ref 65–99)
POTASSIUM: 3.5 mmol/L (ref 3.5–5.1)
Sodium: 136 mmol/L (ref 135–145)
TOTAL PROTEIN: 6.6 g/dL (ref 6.5–8.1)
Total Bilirubin: 0.5 mg/dL (ref 0.3–1.2)

## 2015-04-14 LAB — URINE CULTURE: Culture: 9000

## 2015-04-14 MED ORDER — DEXTROSE 5 % IV SOLN
1.0000 g | INTRAVENOUS | Status: DC
  Administered 2015-04-14 – 2015-04-15 (×2): 1 g via INTRAVENOUS
  Filled 2015-04-14 (×3): qty 10

## 2015-04-14 NOTE — Progress Notes (Signed)
[facility] PROGRESS NOTE Assessment/Plan: UTI (lower urinary tract infection)/ Pyelonephritis - De-escalate antibiotic treatment to Rocephin. Urine cultures pending.    Code Status: full Family Communication: husband  Disposition Plan: home in 1-2 days   Consultants:  none  Procedures:  none  Antibiotics:  Rocephin  HPI/Subjective: No compalins  Objective: Filed Vitals:   04/13/15 2102 04/13/15 2158 04/13/15 2300 04/14/15 0517  BP: 132/74 123/86  109/56  Pulse: 79 82  80  Temp:  97.9 F (36.6 C)  97.7 F (36.5 C)  TempSrc:  Oral  Oral  Resp: 18 17  17   Height:   5' (1.524 m)   Weight:   95.8 kg (211 lb 3.2 oz)   SpO2: 97% 97%  98%    Intake/Output Summary (Last 24 hours) at 04/14/15 1157 Last data filed at 04/14/15 0958  Gross per 24 hour  Intake    240 ml  Output      0 ml  Net    240 ml   Filed Weights   04/13/15 2300  Weight: 95.8 kg (211 lb 3.2 oz)    Exam:  General: Alert, awake, oriented x3, in no acute distress.  HEENT: No bruits, no goiter.  Heart: Regular rate and rhythm. Lungs: Good air movement clear Abdomen: Soft, nontender, nondistended, positive bowel sounds.  Neuro: Grossly intact, nonfocal.   Data Reviewed: Basic Metabolic Panel:  Recent Labs Lab 04/13/15 1601 04/14/15 0531  NA 137 136  K 3.7 3.5  CL 103 103  CO2 26 27  GLUCOSE 91 98  BUN 10 10  CREATININE 0.86 0.82  CALCIUM 9.0 8.2*   Liver Function Tests:  Recent Labs Lab 04/13/15 1601 04/14/15 0531  AST 14* 17  ALT 12* 13*  ALKPHOS 28* 25*  BILITOT 0.5 0.5  PROT 7.2 6.6  ALBUMIN 3.9 3.6    Recent Labs Lab 04/13/15 1601  LIPASE 32   No results for input(s): AMMONIA in the last 168 hours. CBC:  Recent Labs Lab 04/13/15 1601 04/14/15 0531  WBC 8.4 9.6  NEUTROABS 4.9 4.5  HGB 13.2 11.9*  HCT 39.0 36.4  MCV 89.0 89.2  PLT 315 257   Cardiac Enzymes: No results for input(s): CKTOTAL, CKMB, CKMBINDEX, TROPONINI in the last 168  hours. BNP (last 3 results) No results for input(s): BNP in the last 8760 hours.  ProBNP (last 3 results) No results for input(s): PROBNP in the last 8760 hours.  CBG:  Recent Labs Lab 04/12/15 0737  GLUCAP 105*    Recent Results (from the past 240 hour(s))  Urine culture     Status: None   Collection Time: 04/05/15 11:07 AM  Result Value Ref Range Status   Specimen Description URINE, CLEAN CATCH  Final   Special Requests NONE  Final   Culture   Final    MULTIPLE SPECIES PRESENT, SUGGEST RECOLLECTION Performed at [facility]    Report Status 04/07/2015 FINAL  Final  Urine culture     Status: None (Preliminary result)   Collection Time: 04/12/15  7:48 AM  Result Value Ref Range Status   Specimen Description URINE, CLEAN CATCH  Final   Special Requests Immunocompromised  Final   Culture   Final    CULTURE REINCUBATED FOR BETTER GROWTH Performed at [facility]    Report Status PENDING  Incomplete     Studies: No results found.  Scheduled Meds: . enoxaparin (LOVENOX) injection  40 mg Subcutaneous Q24H  . lisinopril  40  mg Oral Daily   And  . hydrochlorothiazide  50 mg Oral Daily  . imipenem-cilastatin  500 mg Intravenous 4 times per day  . venlafaxine XR  225 mg Oral QHS   Continuous Infusions: . sodium chloride 125 mL/hr at 04/14/15 1058    Time Spent: 25 min   Marinda Elk  [facility] Pager [phone]. If 7PM-7AM, please contact night-coverage at [email or web address], password [email or web address] 04/14/2015, 11:57 AM

## 2015-04-15 ENCOUNTER — Inpatient Hospital Stay (HOSPITAL_COMMUNITY): Payer: Medicare Other

## 2015-04-15 ENCOUNTER — Encounter (HOSPITAL_COMMUNITY): Payer: Self-pay | Admitting: Radiology

## 2015-04-15 DIAGNOSIS — D649 Anemia, unspecified: Secondary | ICD-10-CM

## 2015-04-15 LAB — URINE CULTURE: Culture: NO GROWTH

## 2015-04-15 MED ORDER — CYCLOBENZAPRINE HCL 5 MG PO TABS
5.0000 mg | ORAL_TABLET | Freq: Once | ORAL | Status: AC
  Administered 2015-04-15: 5 mg via ORAL
  Filled 2015-04-15: qty 1

## 2015-04-15 MED ORDER — IOHEXOL 300 MG/ML  SOLN
25.0000 mL | INTRAMUSCULAR | Status: AC
  Administered 2015-04-15 (×2): 25 mL via ORAL

## 2015-04-15 MED ORDER — HYDROCODONE-ACETAMINOPHEN 5-325 MG PO TABS
1.0000 | ORAL_TABLET | ORAL | Status: AC | PRN
  Administered 2015-04-15 – 2015-04-16 (×2): 1 via ORAL
  Filled 2015-04-15 (×2): qty 1

## 2015-04-15 MED ORDER — IOHEXOL 300 MG/ML  SOLN
100.0000 mL | Freq: Once | INTRAMUSCULAR | Status: AC | PRN
  Administered 2015-04-15: 100 mL via INTRAVENOUS

## 2015-04-15 MED ORDER — PANTOPRAZOLE SODIUM 40 MG PO TBEC
40.0000 mg | DELAYED_RELEASE_TABLET | Freq: Two times a day (BID) | ORAL | Status: DC
  Administered 2015-04-15 – 2015-04-18 (×8): 40 mg via ORAL
  Filled 2015-04-15 (×10): qty 1

## 2015-04-15 MED ORDER — IBUPROFEN 200 MG PO TABS
600.0000 mg | ORAL_TABLET | Freq: Four times a day (QID) | ORAL | Status: DC | PRN
  Administered 2015-04-15 – 2015-04-18 (×4): 600 mg via ORAL
  Filled 2015-04-15 (×4): qty 3

## 2015-04-15 MED ORDER — POLYETHYLENE GLYCOL 3350 17 G PO PACK
17.0000 g | PACK | Freq: Two times a day (BID) | ORAL | Status: AC
  Administered 2015-04-15: 17 g via ORAL
  Filled 2015-04-15 (×2): qty 1

## 2015-04-15 MED ORDER — PHENAZOPYRIDINE HCL 200 MG PO TABS
200.0000 mg | ORAL_TABLET | Freq: Three times a day (TID) | ORAL | Status: AC
  Administered 2015-04-15 – 2015-04-17 (×6): 200 mg via ORAL
  Filled 2015-04-15 (×6): qty 1

## 2015-04-15 MED ORDER — DIPHENHYDRAMINE HCL 25 MG PO CAPS
25.0000 mg | ORAL_CAPSULE | Freq: Four times a day (QID) | ORAL | Status: DC | PRN
  Administered 2015-04-15 – 2015-04-17 (×4): 25 mg via ORAL
  Filled 2015-04-15 (×4): qty 1

## 2015-04-15 NOTE — Progress Notes (Signed)
[facility] PROGRESS NOTE Assessment/Plan: UTI (lower urinary tract infection)/ Pyelonephritis - De-escalate antibiotic treatment to Rocephin. Urine cultures pending. - Has remained afebrile no leukocytosis, DC morphine start her on ibuprofen. - MiraLAX when necessary.   Normocytic anemia: Follow-up with primary care doctor as an outpatient.  Code Status: full Family Communication: husband  Disposition Plan: home in 1-2 days   Consultants:  none  Procedures:  none  Antibiotics:  Rocephin  HPI/Subjective: Relate that she continues to have suprapubic pain. Objective: Filed Vitals:   04/14/15 1356 04/14/15 2118 04/15/15 0612 04/15/15 0933  BP: 103/64 119/63 119/65 120/72  Pulse: 82 79 86   Temp: 98 F (36.7 C) 98 F (36.7 C) 97.4 F (36.3 C)   TempSrc: Oral Oral Oral   Resp: 16 17 17    Height:      Weight:      SpO2: 97% 99% 97%     Intake/Output Summary (Last 24 hours) at 04/15/15 1057 Last data filed at 04/15/15 0813  Gross per 24 hour  Intake    840 ml  Output      0 ml  Net    840 ml   Filed Weights   04/13/15 2300  Weight: 95.8 kg (211 lb 3.2 oz)    Exam:  General: Alert, awake, oriented x3, in no acute distress.  HEENT: No bruits, no goiter.  Heart: Regular rate and rhythm. Lungs: Good air movement clear Abdomen: Soft, nontender, nondistended, positive bowel sounds.  Neuro: Grossly intact, nonfocal.   Data Reviewed: Basic Metabolic Panel:  Recent Labs Lab 04/13/15 1601 04/14/15 0531  NA 137 136  K 3.7 3.5  CL 103 103  CO2 26 27  GLUCOSE 91 98  BUN 10 10  CREATININE 0.86 0.82  CALCIUM 9.0 8.2*   Liver Function Tests:  Recent Labs Lab 04/13/15 1601 04/14/15 0531  AST 14* 17  ALT 12* 13*  ALKPHOS 28* 25*  BILITOT 0.5 0.5  PROT 7.2 6.6  ALBUMIN 3.9 3.6    Recent Labs Lab 04/13/15 1601  LIPASE 32   No results for input(s): AMMONIA in the last 168 hours. CBC:  Recent Labs Lab 04/13/15 1601  04/14/15 0531  WBC 8.4 9.6  NEUTROABS 4.9 4.5  HGB 13.2 11.9*  HCT 39.0 36.4  MCV 89.0 89.2  PLT 315 257   Cardiac Enzymes: No results for input(s): CKTOTAL, CKMB, CKMBINDEX, TROPONINI in the last 168 hours. BNP (last 3 results) No results for input(s): BNP in the last 8760 hours.  ProBNP (last 3 results) No results for input(s): PROBNP in the last 8760 hours.  CBG:  Recent Labs Lab 04/12/15 0737  GLUCAP 105*    Recent Results (from the past 240 hour(s))  Urine culture     Status: None   Collection Time: 04/05/15 11:07 AM  Result Value Ref Range Status   Specimen Description URINE, CLEAN CATCH  Final   Special Requests NONE  Final   Culture   Final    MULTIPLE SPECIES PRESENT, SUGGEST RECOLLECTION Performed at [facility]    Report Status 04/07/2015 FINAL  Final  Urine culture     Status: None   Collection Time: 04/12/15  7:48 AM  Result Value Ref Range Status   Specimen Description URINE, CLEAN CATCH  Final   Special Requests Immunocompromised  Final   Culture   Final    9,000 COLONIES/mL INSIGNIFICANT GROWTH Performed at [facility]    Report Status 04/14/2015 FINAL  Final  Studies: US Abdomen Complete  04/14/2015   CLINICAL DATA:  Abdominal pain.  History of bladder surgery.  EXAM: ULTRASOUND ABDOMEN COMPLETE  COMPARISON:  CT, 03/31/2015  FINDINGS: Gallbladder: Multiple mobile dependent gallstones. No wall thickening. No pericholecystic fluid.  Common bile duct: Diameter: 3.7 mm  Liver: Coarsened echotexture. No mass or focal lesion. Normal hepatopetal flow in the portal vein.  IVC: No abnormality visualized.  Pancreas: Visualized portion unremarkable.  Spleen: Size and appearance within normal limits.  Right Kidney: Length: 13.2 cm. Normal echogenicity. Extrarenal pelvis but no hydronephrosis. No masses.  Left Kidney: Length: 13.3 cm. 16 mm lower pole cyst. Normal parenchymal echogenicity. No hydronephrosis.  Abdominal aorta: No aneurysm  visualized.  Other findings: None.  IMPRESSION: 1. No acute findings.  No findings to explain abdominal pain. 2. Coarsened echotexture to the liver most likely due to hepatic steatosis. 3. Extrarenal pelvis on the right, but it fro cyst. Small left renal cyst.   Electronically Signed   By: Amie Portland M.D.   On: 04/14/2015 12:34    Scheduled Meds: . cefTRIAXone (ROCEPHIN)  IV  1 g Intravenous Q24H  . enoxaparin (LOVENOX) injection  40 mg Subcutaneous Q24H  . lisinopril  40 mg Oral Daily   And  . hydrochlorothiazide  50 mg Oral Daily  . venlafaxine XR  225 mg Oral QHS   Continuous Infusions: . sodium chloride 125 mL/hr at 04/15/15 0245    Time Spent: 25 min   Marinda Elk  [facility] Pager [phone]. If 7PM-7AM, please contact night-coverage at [email or web address], password [facility] 04/15/2015, 10:57 AM  LOS: 1 day

## 2015-04-15 NOTE — Progress Notes (Signed)
Pt reported that she suspect she is having a reaction to oral contrast. MD notified. Pt was given benadryl

## 2015-04-16 DIAGNOSIS — R1032 Left lower quadrant pain: Secondary | ICD-10-CM

## 2015-04-16 MED ORDER — HYDROCODONE-ACETAMINOPHEN 5-325 MG PO TABS
1.0000 | ORAL_TABLET | ORAL | Status: AC | PRN
  Administered 2015-04-16 (×2): 1 via ORAL
  Filled 2015-04-16 (×2): qty 1

## 2015-04-16 MED ORDER — HYDROCODONE-ACETAMINOPHEN 5-325 MG PO TABS
1.0000 | ORAL_TABLET | ORAL | Status: DC | PRN
  Administered 2015-04-16 – 2015-04-18 (×9): 1 via ORAL
  Filled 2015-04-16 (×9): qty 1

## 2015-04-16 MED ORDER — VANCOMYCIN 50 MG/ML ORAL SOLUTION
125.0000 mg | Freq: Four times a day (QID) | ORAL | Status: DC
  Administered 2015-04-16 – 2015-04-19 (×12): 125 mg via ORAL
  Filled 2015-04-16 (×16): qty 2.5

## 2015-04-16 MED ORDER — SODIUM CHLORIDE 0.9 % IV BOLUS (SEPSIS)
2000.0000 mL | Freq: Once | INTRAVENOUS | Status: AC
  Administered 2015-04-16: 2000 mL via INTRAVENOUS

## 2015-04-16 NOTE — Progress Notes (Signed)
[facility] PROGRESS NOTE Assessment/Plan: Abdominal pain: - UC negative till date CT Abd and pelvis showed colitis, Ddx Infectious colitis vs ischemic - No recent diarrhea, no leukocytosis, with a low Blood pressure. - Will give IV bolus hold Bp medications, check c.dif and start Vanc empirically until PC dif PCR pending. - norco. She relates she has had colonoscopy in the past for   Normocytic anemia: Follow-up with primary care doctor as an outpatient.  Code Status: full Family Communication: husband  Disposition Plan: home in 1-2 days   Consultants:  none  Procedures:  none  Antibiotics:  Rocephin  HPI/Subjective: Relate that she continues to have suprapubic and left flank pain  Objective: Filed Vitals:   04/15/15 0933 04/15/15 1404 04/15/15 2037 04/16/15 0511  BP: 120/72 120/53 117/74 94/57  Pulse:  108 84 62  Temp:  97.5 F (36.4 C) 98.1 F (36.7 C) 97.8 F (36.6 C)  TempSrc:  Oral Oral Oral  Resp:  20 18 16   Height:      Weight:      SpO2:  100% 97% 99%    Intake/Output Summary (Last 24 hours) at 04/16/15 0947 Last data filed at 04/16/15 0600  Gross per 24 hour  Intake   3530 ml  Output      0 ml  Net   3530 ml   Filed Weights   04/13/15 2300  Weight: 95.8 kg (211 lb 3.2 oz)    Exam:  General: Alert, awake, oriented x3, in no acute distress.  HEENT: No bruits, no goiter.  Heart: Regular rate and rhythm. Lungs: Good air movement clear Abdomen: Soft, tender suprapubic and left flank pain, nondistended, positive bowel sounds.  Neuro: Grossly intact, nonfocal.   Data Reviewed: Basic Metabolic Panel:  Recent Labs Lab 04/13/15 1601 04/14/15 0531  NA 137 136  K 3.7 3.5  CL 103 103  CO2 26 27  GLUCOSE 91 98  BUN 10 10  CREATININE 0.86 0.82  CALCIUM 9.0 8.2*   Liver Function Tests:  Recent Labs Lab 04/13/15 1601 04/14/15 0531  AST 14* 17  ALT 12* 13*  ALKPHOS 28* 25*  BILITOT 0.5 0.5  PROT 7.2 6.6  ALBUMIN 3.9  3.6    Recent Labs Lab 04/13/15 1601  LIPASE 32   No results for input(s): AMMONIA in the last 168 hours. CBC:  Recent Labs Lab 04/13/15 1601 04/14/15 0531  WBC 8.4 9.6  NEUTROABS 4.9 4.5  HGB 13.2 11.9*  HCT 39.0 36.4  MCV 89.0 89.2  PLT 315 257   Cardiac Enzymes: No results for input(s): CKTOTAL, CKMB, CKMBINDEX, TROPONINI in the last 168 hours. BNP (last 3 results) No results for input(s): BNP in the last 8760 hours.  ProBNP (last 3 results) No results for input(s): PROBNP in the last 8760 hours.  CBG:  Recent Labs Lab 04/12/15 0737  GLUCAP 105*    Recent Results (from the past 240 hour(s))  Urine culture     Status: None   Collection Time: 04/12/15  7:48 AM  Result Value Ref Range Status   Specimen Description URINE, CLEAN CATCH  Final   Special Requests Immunocompromised  Final   Culture   Final    9,000 COLONIES/mL INSIGNIFICANT GROWTH Performed at [facility]    Report Status 04/14/2015 FINAL  Final  Urine culture     Status: None   Collection Time: 04/13/15  4:16 PM  Result Value Ref Range Status   Specimen Description URINE, CATHETERIZED  Final   Special Requests roceph, keflex, bactrim  Final   Culture   Final    NO GROWTH 1 DAY Performed at [facility]    Report Status 04/15/2015 FINAL  Final     Studies: US Abdomen Complete  04/14/2015   CLINICAL DATA:  Abdominal pain.  History of bladder surgery.  EXAM: ULTRASOUND ABDOMEN COMPLETE  COMPARISON:  CT, 03/31/2015  FINDINGS: Gallbladder: Multiple mobile dependent gallstones. No wall thickening. No pericholecystic fluid.  Common bile duct: Diameter: 3.7 mm  Liver: Coarsened echotexture. No mass or focal lesion. Normal hepatopetal flow in the portal vein.  IVC: No abnormality visualized.  Pancreas: Visualized portion unremarkable.  Spleen: Size and appearance within normal limits.  Right Kidney: Length: 13.2 cm. Normal echogenicity. Extrarenal pelvis but no hydronephrosis. No masses.   Left Kidney: Length: 13.3 cm. 16 mm lower pole cyst. Normal parenchymal echogenicity. No hydronephrosis.  Abdominal aorta: No aneurysm visualized.  Other findings: None.  IMPRESSION: 1. No acute findings.  No findings to explain abdominal pain. 2. Coarsened echotexture to the liver most likely due to hepatic steatosis. 3. Extrarenal pelvis on the right, but it fro cyst. Small left renal cyst.   Electronically Signed   By: Amie Portland M.D.   On: 04/14/2015 12:34   Ct Abdomen Pelvis W Contrast  04/15/2015   CLINICAL DATA:  44 year old female with flank and back pain, nausea, and vomiting for 1 month. Fever for 2 days. Recently on antibiotics.  EXAM: CT ABDOMEN AND PELVIS WITH CONTRAST  TECHNIQUE: Multidetector CT imaging of the abdomen and pelvis was performed using the standard protocol following bolus administration of intravenous contrast.  CONTRAST:  OMNIPAQUE IOHEXOL 300 MG/ML  SOLN  COMPARISON:  03/31/2015 and prior CTs  FINDINGS: Lower chest:  Unremarkable  Hepatobiliary: The gallbladder and liver are unremarkable. There is no evidence of biliary dilatation.  Pancreas: Unremarkable  Spleen: Unremarkable  Adrenals/Urinary Tract: The kidneys, adrenal glands and bladder are unremarkable.  Stomach/Bowel: There is mild circumferential wall thickening of the descending and sigmoid colon with mild adjacent inflammation compatible with colitis. There is no evidence of bowel obstruction or pneumoperitoneum. The appendix is normal.  Vascular/Lymphatic: No enlarged lymph nodes or abdominal aortic aneurysm.  Reproductive: The uterus and adnexal regions are unremarkable.  Other: A small amount of free fluid within the pelvis is noted. There is no evidence of abscess.  Musculoskeletal: No acute or suspicious abnormalities identified.  IMPRESSION: Descending and sigmoid colitis which may be pseudomembranous with recent antibiotic treatment. No evidence of bowel obstruction, pneumoperitoneum or abscess.    Electronically Signed   By: Harmon Pier M.D.   On: 04/15/2015 16:39    Scheduled Meds: . enoxaparin (LOVENOX) injection  40 mg Subcutaneous Q24H  . lisinopril  40 mg Oral Daily   And  . hydrochlorothiazide  50 mg Oral Daily  . pantoprazole  40 mg Oral BID  . phenazopyridine  200 mg Oral TID WC  . polyethylene glycol  17 g Oral BID  . vancomycin  125 mg Oral 4 times per day  . venlafaxine XR  225 mg Oral QHS   Continuous Infusions: . sodium chloride 125 mL/hr at 04/16/15 0748    Time Spent: 25 min   Marinda Elk  [facility] Pager [phone]. If 7PM-7AM, please contact night-coverage at [email or web address], password [facility] 04/16/2015, 9:47 AM  LOS: 2 days

## 2015-04-16 NOTE — Care Management Important Message (Signed)
Important Message  Patient Details  Name: Regina Patton MRN: [id number] Date of Birth: 1970-12-26   Medicare Important Message Given:  Yes-second notification given    Haskell Flirt 04/16/2015, 12:11 PMImportant Message  Patient Details  Name: Regina Patton MRN: [id number] Date of Birth: 25-Apr-1971   Medicare Important Message Given:  Yes-second notification given    Haskell Flirt 04/16/2015, 12:11 PM

## 2015-04-16 NOTE — Progress Notes (Signed)
New order for C.diff PCR per MD, patient placed on enteric precautions.  Patient educated.

## 2015-04-17 DIAGNOSIS — K529 Noninfective gastroenteritis and colitis, unspecified: Secondary | ICD-10-CM

## 2015-04-17 DIAGNOSIS — N12 Tubulo-interstitial nephritis, not specified as acute or chronic: Principal | ICD-10-CM

## 2015-04-17 DIAGNOSIS — N390 Urinary tract infection, site not specified: Secondary | ICD-10-CM

## 2015-04-17 LAB — C DIFFICILE QUICK SCREEN W PCR REFLEX
C DIFFICILE (CDIFF) INTERP: POSITIVE
C Diff antigen: POSITIVE — AB
C Diff toxin: POSITIVE — AB

## 2015-04-17 MED ORDER — POLYETHYLENE GLYCOL 3350 17 G PO PACK: 17.0000 g | PACK | Freq: Once | ORAL | Status: DC

## 2015-04-17 MED ORDER — DEXTROSE 5 % IV SOLN
1.0000 g | INTRAVENOUS | Status: DC
  Administered 2015-04-17: 1 g via INTRAVENOUS
  Filled 2015-04-17: qty 10

## 2015-04-17 MED ORDER — BISACODYL 10 MG RE SUPP
10.0000 mg | Freq: Once | RECTAL | Status: AC
  Administered 2015-04-17: 10 mg via RECTAL
  Filled 2015-04-17: qty 1

## 2015-04-17 MED ORDER — MORPHINE SULFATE 2 MG/ML IJ SOLN
1.0000 mg | INTRAMUSCULAR | Status: AC | PRN
  Administered 2015-04-17 – 2015-04-18 (×3): 1 mg via INTRAVENOUS
  Filled 2015-04-17 (×3): qty 1

## 2015-04-17 NOTE — Progress Notes (Addendum)
[facility] Progress Note   NGUYET LAFONTANT XBW:[id number] DOB: December 28, 1970 DOA: 04/13/2015 PCP: Kaleen Mask, MD  Brief narrative: Regina Patton is a 44 y.o. female with recent UTI who presented to the hospital with flank pain, dysuria nausea and vomiting. Urinary tract infection was initially treated with ciprofloxacin for 1 week. She represented to the ER in 7/29 with symptoms of dysuria and flank pain and was admitted to the hospital and started on IV Rocephin. Based on sensitivity she was discharged home on Bactrim.  On day 4 of Bactrim she developed an urticarial rash. Bactrim was discontinued and the following day she was started on Keflex. This caused a diffuse red rash and she re-presented to the hospital the following day. She states symptoms of dysuria and flank pain never resolved. In the hospital she was started on Rocephin with resolution of symptoms. But she subsequently started to develop diarrhea which was quite severe. At this point a CT of the abdomen and pelvis was done which revealed descending and sigmoid colitis. She was started on oral vancomycin. Diarrhea has resolved.   Subjective: Still having pain in the suprapubic area and some mild bilateral flank pain. No nausea or vomiting. Is eating solid food. Urinating well without dysuria.  Assessment/Plan: Active Problems:    UTI (lower urinary tract infection)/pyelonephritis/fever 102 - cipro x 1 wk- no releif in symptoms - Admitted and started on Rocephin and then discharged on 7/31  - 8/1- Bactrim x 4 days (M-TH)- no relief in symptoms- hives on Thursday - Keflex on Friday- diffuse red rash -Started on Macrobid 8/5 - 8/6 fever 102-ongoing flank pain and dysuria- Admitted -found to have a UA with large leukocytes and started again on Rocephin - resolution of dysuria -Urine culture from 7/24 reveals organism is sensitive to Rocephin-it was discontinued yesterday-culture thus far is negative for this could be  because she was on antibiotics up until the day of admission-  - CT on 7/24 revealed bladder diverticula-  - will request a urology eval  Diarrhea -Starting 2 days ago- profuse -CT scan reveals descending and sigmoid -Possibly C. difficile colitis due to excessive need for antibiotics with resolution after starting vancomycin-C. difficile PCR still pending -Downgrade diet to clear liquids as she continues to have suprapubic pain  Appt with PCP: Requested Code Status: Full code Family Communication:  Disposition Plan: Home when stable DVT prophylaxis: Lovenox Consultants: Procedures:  Antibiotics: Anti-infectives    Start     Dose/Rate Route Frequency Ordered Stop   04/17/15 1100  cefTRIAXone (ROCEPHIN) 1 g in dextrose 5 % 50 mL IVPB     1 g 100 mL/hr over 30 Minutes Intravenous Every 24 hours 04/17/15 1059     04/16/15 1200  vancomycin (VANCOCIN) 50 mg/mL oral solution 125 mg     125 mg Oral 4 times per day 04/16/15 0924     04/14/15 1400  cefTRIAXone (ROCEPHIN) 1 g in dextrose 5 % 50 mL IVPB  Status:  Discontinued     1 g 100 mL/hr over 30 Minutes Intravenous Every 24 hours 04/14/15 1159 04/16/15 0924   04/13/15 1830  imipenem-cilastatin (PRIMAXIN) 500 mg in sodium chloride 0.9 % 100 mL IVPB  Status:  Discontinued     500 mg 200 mL/hr over 30 Minutes Intravenous 4 times per day 04/13/15 1817 04/14/15 1159      Objective: Filed Weights   04/13/15 2300  Weight: 95.8 kg (211 lb 3.2 oz)    Intake/Output Summary (Last 24  hours) at 04/17/15 1101 Last data filed at 04/17/15 0811  Gross per 24 hour  Intake   2830 ml  Output      0 ml  Net   2830 ml     Vitals Filed Vitals:   04/16/15 0511 04/16/15 1500 04/16/15 2159 04/17/15 0513  BP: 94/57 104/62 120/85 119/39  Pulse: 62 80 78 78  Temp: 97.8 F (36.6 C) 98 F (36.7 C) 98.5 F (36.9 C) 98.3 F (36.8 C)  TempSrc: Oral Oral Oral Oral  Resp: 16 16 18 18   Height:      Weight:      SpO2: 99% 97% 99% 96%     Exam:  General:  Pt is alert, not in acute distress  HEENT: No icterus, No thrush, oral mucosa moist  Cardiovascular: regular rate and rhythm, S1/S2 No murmur  Respiratory: clear to auscultation bilaterally   Abdomen: Soft, +Bowel sounds, non tender, non distended, no guarding  MSK: No LE edema, cyanosis or clubbing  Data Reviewed: Basic Metabolic Panel:  Recent Labs Lab 04/13/15 1601 04/14/15 0531  NA 137 136  K 3.7 3.5  CL 103 103  CO2 26 27  GLUCOSE 91 98  BUN 10 10  CREATININE 0.86 0.82  CALCIUM 9.0 8.2*   Liver Function Tests:  Recent Labs Lab 04/13/15 1601 04/14/15 0531  AST 14* 17  ALT 12* 13*  ALKPHOS 28* 25*  BILITOT 0.5 0.5  PROT 7.2 6.6  ALBUMIN 3.9 3.6    Recent Labs Lab 04/13/15 1601  LIPASE 32   No results for input(s): AMMONIA in the last 168 hours. CBC:  Recent Labs Lab 04/13/15 1601 04/14/15 0531  WBC 8.4 9.6  NEUTROABS 4.9 4.5  HGB 13.2 11.9*  HCT 39.0 36.4  MCV 89.0 89.2  PLT 315 257   Cardiac Enzymes: No results for input(s): CKTOTAL, CKMB, CKMBINDEX, TROPONINI in the last 168 hours. BNP (last 3 results) No results for input(s): BNP in the last 8760 hours.  ProBNP (last 3 results) No results for input(s): PROBNP in the last 8760 hours.  CBG:  Recent Labs Lab 04/12/15 0737  GLUCAP 105*    Recent Results (from the past 240 hour(s))  Urine culture     Status: None   Collection Time: 04/12/15  7:48 AM  Result Value Ref Range Status   Specimen Description URINE, CLEAN CATCH  Final   Special Requests Immunocompromised  Final   Culture   Final    9,000 COLONIES/mL INSIGNIFICANT GROWTH Performed at [facility]    Report Status 04/14/2015 FINAL  Final  Urine culture     Status: None   Collection Time: 04/13/15  4:16 PM  Result Value Ref Range Status   Specimen Description URINE, CATHETERIZED  Final   Special Requests roceph, keflex, bactrim  Final   Culture   Final    NO GROWTH 1 DAY Performed at  [facility]    Report Status 04/15/2015 FINAL  Final     Studies: Ct Abdomen Pelvis W Contrast  04/15/2015   CLINICAL DATA:  44 year old female with flank and back pain, nausea, and vomiting for 1 month. Fever for 2 days. Recently on antibiotics.  EXAM: CT ABDOMEN AND PELVIS WITH CONTRAST  TECHNIQUE: Multidetector CT imaging of the abdomen and pelvis was performed using the standard protocol following bolus administration of intravenous contrast.  CONTRAST:  55 OMNIPAQUE IOHEXOL 300 MG/ML  SOLN  COMPARISON:  03/31/2015 and prior CTs  FINDINGS: Lower chest:  Unremarkable  Hepatobiliary: The gallbladder and liver are unremarkable. There is no evidence of biliary dilatation.  Pancreas: Unremarkable  Spleen: Unremarkable  Adrenals/Urinary Tract: The kidneys, adrenal glands and bladder are unremarkable.  Stomach/Bowel: There is mild circumferential wall thickening of the descending and sigmoid colon with mild adjacent inflammation compatible with colitis. There is no evidence of bowel obstruction or pneumoperitoneum. The appendix is normal.  Vascular/Lymphatic: No enlarged lymph nodes or abdominal aortic aneurysm.  Reproductive: The uterus and adnexal regions are unremarkable.  Other: A small amount of free fluid within the pelvis is noted. There is no evidence of abscess.  Musculoskeletal: No acute or suspicious abnormalities identified.  IMPRESSION: Descending and sigmoid colitis which may be pseudomembranous with recent antibiotic treatment. No evidence of bowel obstruction, pneumoperitoneum or abscess.   Electronically Signed   By: Harmon Pier M.D.   On: 04/15/2015 16:39    Scheduled Meds:  Scheduled Meds: . cefTRIAXone (ROCEPHIN)  IV  1 g Intravenous Q24H  . enoxaparin (LOVENOX) injection  40 mg Subcutaneous Q24H  . lisinopril  40 mg Oral Daily   And  . hydrochlorothiazide  50 mg Oral Daily  . pantoprazole  40 mg Oral BID  . phenazopyridine  200 mg Oral TID WC  . vancomycin  125 mg Oral  4 times per day  . venlafaxine XR  225 mg Oral QHS   Continuous Infusions: . sodium chloride 125 mL/hr at 04/17/15 1039    Time spent on care of this patient: 45 min   Myers Tutterow, MD 04/17/2015, 11:01 AM  LOS: 3 days   [facility] Office  [phone] Pager - Text Page per [email or web address] If 7PM-7AM, please contact night-coverage [email or web address]

## 2015-04-17 NOTE — Consult Note (Signed)
Urology Consult  Referring physician: Karlyne Greenspan Reason for referral: ? UTI  Chief Complaint: ? UTI  History of Present Illness: Admitted with flank pain and dysuria and nausea and vomiting; had a hospitalized UTI treated appropriately recently; CT in keeping with colitis; side effects from bactrim and keflex; this c/s negative; no renal lesions on CT; C diff positive Clinical UTI cleared and dysuria gone Normally does not get recurrent UTI Minimal LUTS at baseline Two previous bladder tacks Modifying factors: There are no other modifying factors  Associated signs and symptoms: There are no other associated signs and symptoms Aggravating and relieving factors: There are no other aggravating or relieving factors Severity: Moderate Duration: improved   Past Medical History  Diagnosis Date  . Hypertension   . Chronic back pain   . Sciatic pain     right  . Rib pain on right side     chronic  . Chronic neck pain   . Cervical radiculopathy   . Chronic knee pain   . Rheumatoid arthritis     "Rhematoid factor positive but no symptoms" per EPIC notes 2012  . Fibromyalgia   . Diabetes mellitus without complication    Past Surgical History  Procedure Laterality Date  . Tubal ligation    . Bladder surgery      Medications: I have reviewed the patient's current medications. Allergies:  Allergies  Allergen Reactions  . Mango Flavor Anaphylaxis and Swelling    Lips swelling  . Penicillins Anaphylaxis and Hives  . Tramadol Nausea And Vomiting  . Bactrim [Sulfamethoxazole-Trimethoprim] Itching and Rash  . Keflex [Cephalexin] Hives, Itching and Rash  . Toradol [Ketorolac Tromethamine] Rash    History reviewed. No pertinent family history. Social History:  reports that she has never smoked. She does not have any smokeless tobacco history on file. She reports that she does not drink alcohol or use illicit drugs.  ROS: All systems are reviewed and negative except as noted. Rest  negative  Physical Exam:  Vital signs in last 24 hours: Temp:  [98.3 F (36.8 C)-98.6 F (37 C)] 98.6 F (37 C) (08/10 1418) Pulse Rate:  [78] 78 (08/10 1418) Resp:  [18] 18 (08/10 1418) BP: (119-130)/(39-85) 130/68 mmHg (08/10 1418) SpO2:  [96 %-100 %] 100 % (08/10 1418)  Cardiovascular: Skin warm; not flushed Respiratory: Breaths quiet; no shortness of breath Abdomen: No masses Neurological: Normal sensation to touch Musculoskeletal: Normal motor function arms and legs Lymphatics: No inguinal adenopathy Skin: No rashes Genitourinary:no bladder tenderness  Laboratory Data:  Results for orders placed or performed during the hospital encounter of 04/13/15 (from the past 72 hour(s))  C difficile quick scan w PCR reflex     Status: Abnormal   Collection Time: 04/17/15 12:20 PM  Result Value Ref Range   C Diff antigen POSITIVE (A) NEGATIVE    Comment: CRITICAL RESULT CALLED TO, READ BACK BY AND VERIFIED WITH: ESTHER EDGAL,RN [id number] @ 1450 BY J SCOTTON    C Diff toxin POSITIVE (A) NEGATIVE    Comment: CRITICAL RESULT CALLED TO, READ BACK BY AND VERIFIED WITH: ESTHER EDGAL,RN [id number] @ 1450 BY J SCOTTON    C Diff interpretation Positive for toxigenic C. difficile     Comment: CRITICAL RESULT CALLED TO, READ BACK BY AND VERIFIED WITH: Carleene Cooper [id number] @ 1450 BY J SCOTTON    Recent Results (from the past 240 hour(s))  Urine culture     Status: None   Collection Time: 04/12/15  7:48 AM  Result Value Ref Range Status   Specimen Description URINE, CLEAN CATCH  Final   Special Requests Immunocompromised  Final   Culture   Final    9,000 COLONIES/mL INSIGNIFICANT GROWTH Performed at [facility]    Report Status 04/14/2015 FINAL  Final  Urine culture     Status: None   Collection Time: 04/13/15  4:16 PM  Result Value Ref Range Status   Specimen Description URINE, CATHETERIZED  Final   Special Requests roceph, keflex, bactrim  Final   Culture   Final    NO  GROWTH 1 DAY Performed at [facility]    Report Status 04/15/2015 FINAL  Final  C difficile quick scan w PCR reflex     Status: Abnormal   Collection Time: 04/17/15 12:20 PM  Result Value Ref Range Status   C Diff antigen POSITIVE (A) NEGATIVE Final    Comment: CRITICAL RESULT CALLED TO, READ BACK BY AND VERIFIED WITH: ESTHER EDGAL,RN [id number] @ 1450 BY J SCOTTON    C Diff toxin POSITIVE (A) NEGATIVE Final    Comment: CRITICAL RESULT CALLED TO, READ BACK BY AND VERIFIED WITH: ESTHER EDGAL,RN [id number] @ 1450 BY J SCOTTON    C Diff interpretation Positive for toxigenic C. difficile  Final    Comment: CRITICAL RESULT CALLED TO, READ BACK BY AND VERIFIED WITH: Carleene Cooper [id number] @ 1450 BY J SCOTTON    Creatinine:  Recent Labs  04/13/15 1601 04/14/15 0531  CREATININE 0.86 0.82    Xrays: See report/chart reviewed  Impression/Assessment:  Resolved UTI  Plan:  No active urology management needed She already has appointment at [facility]  Roiza Wiedel A 04/17/2015, 3:48 PM

## 2015-04-18 DIAGNOSIS — R1030 Lower abdominal pain, unspecified: Secondary | ICD-10-CM

## 2015-04-18 LAB — BASIC METABOLIC PANEL
Anion gap: 7 (ref 5–15)
BUN: 5 mg/dL — AB (ref 6–20)
CHLORIDE: 101 mmol/L (ref 101–111)
CO2: 33 mmol/L — AB (ref 22–32)
Calcium: 9 mg/dL (ref 8.9–10.3)
Creatinine, Ser: 0.83 mg/dL (ref 0.44–1.00)
GFR calc non Af Amer: >60 mL/min (ref >60)
Glucose, Bld: 104 mg/dL — ABNORMAL HIGH (ref 65–99)
Potassium: 3.8 mmol/L (ref 3.5–5.1)
Sodium: 141 mmol/L (ref 135–145)

## 2015-04-18 LAB — CBC
HCT: 36.6 % (ref 36.0–46.0)
Hemoglobin: 12.3 g/dL (ref 12.0–15.0)
MCH: 30.4 pg (ref 26.0–34.0)
MCHC: 33.6 g/dL (ref 30.0–36.0)
MCV: 90.4 fL (ref 78.0–100.0)
PLATELETS: 258 10*3/uL (ref 150–400)
RBC: 4.05 MIL/uL (ref 3.87–5.11)
RDW: 13.4 % (ref 11.5–15.5)
WBC: 6.9 10*3/uL (ref 4.0–10.5)

## 2015-04-18 MED ORDER — HYDROCODONE-ACETAMINOPHEN 5-325 MG PO TABS
1.0000 | ORAL_TABLET | Freq: Four times a day (QID) | ORAL | Status: DC | PRN
Start: 2015-04-18 — End: 2015-04-22
  Administered 2015-04-18 – 2015-04-22 (×11): 1 via ORAL
  Filled 2015-04-18 (×11): qty 1

## 2015-04-18 MED ORDER — MORPHINE SULFATE 2 MG/ML IJ SOLN
1.0000 mg | INTRAMUSCULAR | Status: DC | PRN
  Administered 2015-04-18 – 2015-04-21 (×17): 1 mg via INTRAVENOUS
  Filled 2015-04-18 (×17): qty 1

## 2015-04-18 MED ORDER — OXYBUTYNIN CHLORIDE 5 MG PO TABS
5.0000 mg | ORAL_TABLET | Freq: Three times a day (TID) | ORAL | Status: DC
  Administered 2015-04-18 – 2015-04-20 (×7): 5 mg via ORAL
  Filled 2015-04-18 (×11): qty 1

## 2015-04-18 NOTE — Progress Notes (Signed)
[facility] Progress Note   Regina Patton:[id number] DOB: 1971-04-18 DOA: 04/13/2015 PCP: Kaleen Mask, MD  Brief narrative: Regina Patton is a 44 y.o. female with recent UTI who presented to the hospital with flank pain, dysuria nausea and vomiting. Urinary tract infection was initially treated with ciprofloxacin for 1 week. She represented to the ER in 7/29 with symptoms of dysuria and flank pain and was admitted to the hospital and started on IV Rocephin. Based on sensitivity she was discharged home on Bactrim.  On day 4 of Bactrim she developed an urticarial rash. Bactrim was discontinued and the following day she was started on Keflex. This caused a diffuse red rash and she re-presented to the hospital the following day. She states symptoms of dysuria and flank pain never resolved. In the hospital she was started on Rocephin with resolution of symptoms. But she subsequently started to develop diarrhea which was quite severe. At this point a CT of the abdomen and pelvis was done which revealed descending and sigmoid colitis. She was started on oral vancomycin. Diarrhea has resolved.   Subjective: Continues to have pain in the suprapubic area -no further bilateral flank pain. Has been getting nauseated on clear liquids. having watery stools each time she goes to urinate in the commode.   Assessment/Plan: Active Problems:   UTI (lower urinary tract infection)/pyelonephritis/fever 102 - cipro x 1 wk- no releif in symptoms - 7/29 Admitted and started on Rocephin and then discharged on 7/31  - 8/1- Bactrim x 4 days (M-TH)- no relief in symptoms- hives on Thursday - Keflex on Friday- diffuse red rash -Started on Macrobid 8/5 - 8/6 fever 102-ongoing flank pain and dysuria- Admitted -found to have a UA with large leukocytes and started again on Rocephin - resolution of dysuria -Urine culture from 7/24 reveals organism is sensitive to Rocephin-it was discontinued  yesterday-culture thus far is negative for this could be because she was on antibiotics up until the day of admission-  - 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   Diarrhea -Starting on 8/9- profuse -CT scan reveals descending and sigmoid -C. difficile antigen and toxin positive-Downgraded diet to clear liquids as she continues to have suprapubic pain and continues to be nauseated- still has small amount of frequent stools -Continue oral vancomycin which was started on 8/9- per Case manager cost should be $4  Appt with PCP: Requested Code Status: Full code Family Communication:  Disposition Plan: Home when stable DVT prophylaxis: Lovenox Consultants: Procedures:  Antibiotics: Anti-infectives    Start     Dose/Rate Route Frequency Ordered Stop   04/17/15 1200  cefTRIAXone (ROCEPHIN) 1 g in dextrose 5 % 50 mL IVPB  Status:  Discontinued     1 g 100 mL/hr over 30 Minutes Intravenous Every 24 hours 04/17/15 1059 04/17/15 1640   04/16/15 1200  vancomycin (VANCOCIN) 50 mg/mL oral solution 125 mg     125 mg Oral 4 times per day 04/16/15 0924     04/14/15 1400  cefTRIAXone (ROCEPHIN) 1 g in dextrose 5 % 50 mL IVPB  Status:  Discontinued     1 g 100 mL/hr over 30 Minutes Intravenous Every 24 hours 04/14/15 1159 04/16/15 0924   04/13/15 1830  imipenem-cilastatin (PRIMAXIN) 500 mg in sodium chloride 0.9 % 100 mL IVPB  Status:  Discontinued     500 mg 200 mL/hr over 30 Minutes Intravenous 4 times per day 04/13/15 1817 04/14/15 1159      Objective: Filed Weights   04/13/15 2300  Weight: 95.8 kg (211 lb 3.2 oz)    Intake/Output Summary (Last 24 hours) at 04/18/15 1053 Last data filed at 04/18/15 0000  Gross per 24 hour  Intake   2605 ml  Output      1 ml  Net   2604 ml     Vitals Filed Vitals:   04/17/15 0513  04/17/15 1418 04/17/15 2045 04/18/15 0521  BP: 119/39 130/68 122/69 123/85  Pulse: 78 78 75 81  Temp: 98.3 F (36.8 C) 98.6 F (37 C) 97.9 F (36.6 C) 98.2 F (36.8 C)  TempSrc: Oral Oral Oral Oral  Resp: 18 18 18 18   Height:      Weight:      SpO2: 96% 100% 100% 99%    Exam:  General:  Pt is alert, not in acute distress  HEENT: No icterus, No thrush, oral mucosa moist  Cardiovascular: regular rate and rhythm, S1/S2 No murmur  Respiratory: clear to auscultation bilaterally   Abdomen: Soft, +Bowel sounds, suprapubic tenderness, non distended, no guarding  MSK: No LE edema, cyanosis or clubbing  Data Reviewed: Basic Metabolic Panel:  Recent Labs Lab 04/13/15 1601 04/14/15 0531 04/18/15 0551  NA 137 136 141  K 3.7 3.5 3.8  CL 103 103 101  CO2 26 27 33*  GLUCOSE 91 98 104*  BUN 10 10 5*  CREATININE 0.86 0.82 0.83  CALCIUM 9.0 8.2* 9.0   Liver Function Tests:  Recent Labs Lab 04/13/15 1601 04/14/15 0531  AST 14* 17  ALT 12* 13*  ALKPHOS 28* 25*  BILITOT 0.5 0.5  PROT 7.2 6.6  ALBUMIN 3.9 3.6    Recent Labs Lab 04/13/15 1601  LIPASE 32   No results for input(s): AMMONIA in the last 168 hours. CBC:  Recent Labs Lab 04/13/15 1601 04/14/15 0531 04/18/15 0551  WBC 8.4 9.6 6.9  NEUTROABS 4.9 4.5  --   HGB 13.2 11.9* 12.3  HCT 39.0 36.4 36.6  MCV 89.0 89.2 90.4  PLT 315 257 258   Cardiac Enzymes: No results for input(s): CKTOTAL, CKMB, CKMBINDEX, TROPONINI in the last 168 hours. BNP (last 3 results) No results for input(s): BNP in the last 8760 hours.  ProBNP (last 3 results) No results for input(s): PROBNP in the last 8760 hours.  CBG:  Recent Labs Lab 04/12/15 0737  GLUCAP 105*    Recent Results (from the past 240 hour(s))  Urine culture     Status: None   Collection Time: 04/12/15  7:48 AM  Result Value Ref Range Status   Specimen Description URINE, CLEAN CATCH  Final   Special Requests Immunocompromised  Final   Culture    Final    9,000 COLONIES/mL INSIGNIFICANT GROWTH Performed at [facility]    Report Status 04/14/2015 FINAL  Final  Urine culture     Status: None   Collection Time: 04/13/15  4:16 PM  Result Value Ref Range Status   Specimen Description URINE, CATHETERIZED  Final   Special Requests roceph, keflex, bactrim  Final   Culture   Final    NO GROWTH 1 DAY Performed at [facility]    Report Status 04/15/2015 FINAL  Final  C difficile quick scan w PCR reflex     Status: Abnormal   Collection Time: 04/17/15 12:20  PM  Result Value Ref Range Status   C Diff antigen POSITIVE (A) NEGATIVE Final    Comment: CRITICAL RESULT CALLED TO, READ BACK BY AND VERIFIED WITH: ESTHER EDGAL,RN [id number] @ 1450 BY J SCOTTON    C Diff toxin POSITIVE (A) NEGATIVE Final    Comment: CRITICAL RESULT CALLED TO, READ BACK BY AND VERIFIED WITH: ESTHER EDGAL,RN [id number] @ 1450 BY J SCOTTON    C Diff interpretation Positive for toxigenic C. difficile  Final    Comment: CRITICAL RESULT CALLED TO, READ BACK BY AND VERIFIED WITH: Carleene Cooper [id number] @ 1450 BY J SCOTTON      Studies: No results found.  Scheduled Meds:  Scheduled Meds: . enoxaparin (LOVENOX) injection  40 mg Subcutaneous Q24H  . lisinopril  40 mg Oral Daily   And  . hydrochlorothiazide  50 mg Oral Daily  . oxybutynin  5 mg Oral TID  . pantoprazole  40 mg Oral BID  . polyethylene glycol  17 g Oral Once  . vancomycin  125 mg Oral 4 times per day  . venlafaxine XR  225 mg Oral QHS   Continuous Infusions: . sodium chloride 75 mL/hr at 04/18/15 1038    Time spent on care of this patient: 45 min   Craigory Toste, MD 04/18/2015, 10:53 AM  LOS: 4 days   [facility] Office  [phone] Pager - Text Page per [email or web address] If 7PM-7AM, please contact night-coverage [email or web address]

## 2015-04-18 NOTE — Progress Notes (Signed)
Date:  April 18, 2015 U.R. performed for needs and level of care. Will continue to follow for Case Management needs.  Notnamed Scholz, RN, BSN, CCM   [phone] 

## 2015-04-19 DIAGNOSIS — R110 Nausea: Secondary | ICD-10-CM

## 2015-04-19 DIAGNOSIS — A0472 Enterocolitis due to Clostridium difficile, not specified as recurrent: Secondary | ICD-10-CM | POA: Diagnosis present

## 2015-04-19 DIAGNOSIS — A047 Enterocolitis due to Clostridium difficile: Secondary | ICD-10-CM

## 2015-04-19 DIAGNOSIS — Z87440 Personal history of urinary (tract) infections: Secondary | ICD-10-CM

## 2015-04-19 MED ORDER — PROMETHAZINE HCL 25 MG/ML IJ SOLN
25.0000 mg | Freq: Four times a day (QID) | INTRAMUSCULAR | Status: DC | PRN
  Administered 2015-04-19 – 2015-04-22 (×9): 25 mg via INTRAVENOUS
  Filled 2015-04-19 (×9): qty 1

## 2015-04-19 MED ORDER — VANCOMYCIN 50 MG/ML ORAL SOLUTION
500.0000 mg | Freq: Four times a day (QID) | ORAL | Status: DC
  Filled 2015-04-19 (×4): qty 10

## 2015-04-19 MED ORDER — ONDANSETRON HCL 4 MG/2ML IJ SOLN
4.0000 mg | Freq: Four times a day (QID) | INTRAMUSCULAR | Status: DC
  Administered 2015-04-19 – 2015-04-22 (×10): 4 mg via INTRAVENOUS
  Filled 2015-04-19 (×12): qty 2

## 2015-04-19 MED ORDER — VANCOMYCIN 50 MG/ML ORAL SOLUTION
500.0000 mg | Freq: Four times a day (QID) | ORAL | Status: DC
  Administered 2015-04-19 – 2015-04-22 (×12): 500 mg via ORAL
  Filled 2015-04-19 (×19): qty 10

## 2015-04-19 NOTE — Consult Note (Addendum)
[facility] for Infectious Disease  Total days of antibiotics 7        Day 4 vanco               Reason for Consult: c.difficile colitis    Referring Physician:  rizwan  Active Problems:   Pyelonephritis   UTI (lower urinary tract infection)    HPI: Regina Patton is a 44 y.o. 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 She reports being on antibiotics continuously for the last month for recurrent uti due to "diverticuli"  was admitted on 8/9 with right sided flank pain. She also has dysuria, urgency, and frequency with no hematuria or vaginal discharge. She had a fever of 102 and had chills over the last few days. She was admitted after being on other courses of antibiotics with ciprofloxacin for 7 days and bactrim for 6 days with no benefit. Did have rash to both bactrim as well keflex. She started to have loose stools and abdominal pain and nausea. She was initially started on ceftriaxone for presumed uti/pyelonephritis. She underwent abd/pelvis CT which showed evidence of colitis to descending and sigmoid colon with mild adjacent inflammation. Cdiff testing postiive. She was started on vancomycin 153m QID, but has not had much improvement over the last 3 days. She has poor po intake due to nausea, having 6 loose watery stools per day.   Past Medical History  Diagnosis Date  . Hypertension   . Chronic back pain   . Sciatic pain     right  . Rib pain on right side     chronic  . Chronic neck pain   . Cervical radiculopathy   . Chronic knee pain   . Rheumatoid arthritis     "Rhematoid factor positive but no symptoms" per EPIC notes 2012  . Fibromyalgia   . Diabetes mellitus without complication     Allergies:  Allergies  Allergen Reactions  . Mango Flavor Anaphylaxis and Swelling    Lips swelling  . Penicillins Anaphylaxis and Hives    . Tramadol Nausea And Vomiting  . Bactrim [Sulfamethoxazole-Trimethoprim] Itching and Rash  . Keflex [Cephalexin] Hives, Itching and Rash  . Toradol [Ketorolac Tromethamine] Rash    Current antibiotics:   MEDICATIONS: . enoxaparin (LOVENOX) injection  40 mg Subcutaneous Q24H  . lisinopril  40 mg Oral Daily   And  . hydrochlorothiazide  50 mg Oral Daily  . oxybutynin  5 mg Oral TID  . polyethylene glycol  17 g Oral Once  . vancomycin  500 mg Oral 4 times per day  . venlafaxine XR  225 mg Oral QHS    Social History  Substance Use Topics  . Smoking status: Never Smoker   . Smokeless tobacco: None  . Alcohol Use: No    History reviewed. No pertinent family history.  Review of Systems  Constitutional: Negative for fever, chills, diaphoresis, activity change, appetite change, fatigue and unexpected weight change.  HENT: Negative for congestion, sore throat, rhinorrhea, sneezing, trouble swallowing and sinus pressure.  Eyes: Negative for photophobia and visual disturbance.  Respiratory: Negative for cough, chest tightness, shortness of breath, wheezing and stridor.  Cardiovascular: Negative for chest pain, palpitations and leg swelling.  Gastrointestinal: positive for nausea,  abdominal pain, diarrhea, no vomiting, constipation, blood in stool, abdominal distention and anal bleeding.  Genitourinary:  Negative for dysuria, hematuria, flank pain and difficulty urinating.  Musculoskeletal: Negative for myalgias, back pain, joint swelling, arthralgias and gait problem.  Skin: Negative for color change, pallor, rash and wound.  Neurological: Negative for dizziness, tremors, weakness and light-headedness.  Hematological: Negative for adenopathy. Does not bruise/bleed easily.  Psychiatric/Behavioral: Negative for behavioral problems, confusion, sleep disturbance, dysphoric mood, decreased concentration and agitation.     OBJECTIVE: Temp:  [97.9 F (36.6 C)-98.1 F (36.7 C)] 97.9 F  (36.6 C) (08/12 0534) Pulse Rate:  [66-73] 66 (08/12 0534) Resp:  [18] 18 (08/12 0534) BP: (121-137)/(73-87) 121/73 mmHg (08/12 0534) SpO2:  [99 %-100 %] 99 % (08/12 0534) Physical Exam  Constitutional:  oriented to person, place, and time. appears well-developed and well-nourished. No distress.  HENT: [place]/AT, PERRLA, no scleral icterus Mouth/Throat: Oropharynx is clear and moist. No oropharyngeal exudate.  Cardiovascular: Normal rate, regular rhythm and normal heart sounds. Exam reveals no gallop and no friction rub.  No murmur heard.  Pulmonary/Chest: Effort normal and breath sounds normal. No respiratory distress.  has no wheezes.  Neck = supple, no nuchal rigidity Abdominal: Soft. Bowel sounds are normal.  exhibits no distension. There is no tenderness.  Lymphadenopathy: no cervical adenopathy. No axillary adenopathy Neurological: alert and oriented to person, place, and time.  Skin: Skin is warm and dry. No rash noted. No erythema.  Psychiatric: a normal mood and affect.  behavior is normal.   LABS: Results for orders placed or performed during the hospital encounter of 04/13/15 (from the past 48 hour(s))  C difficile quick scan w PCR reflex     Status: Abnormal   Collection Time: 04/17/15 12:20 PM  Result Value Ref Range   C Diff antigen POSITIVE (A) NEGATIVE    Comment: CRITICAL RESULT CALLED TO, READ BACK BY AND VERIFIED WITH: ESTHER EDGAL,RN [id number] @ 1450 BY J SCOTTON    C Diff toxin POSITIVE (A) NEGATIVE    Comment: CRITICAL RESULT CALLED TO, READ BACK BY AND VERIFIED WITH: ESTHER EDGAL,RN [id number] @ 1450 BY J SCOTTON    C Diff interpretation Positive for toxigenic C. difficile     Comment: CRITICAL RESULT CALLED TO, READ BACK BY AND VERIFIED WITH: ESTHER EDGAL,RN [id number] @ 1450 BY J SCOTTON   Basic metabolic panel     Status: Abnormal   Collection Time: 04/18/15  5:51 AM  Result Value Ref Range   Sodium 141 135 - 145 mmol/L   Potassium 3.8 3.5 - 5.1 mmol/L   Chloride  101 101 - 111 mmol/L   CO2 33 (H) 22 - 32 mmol/L   Glucose, Bld 104 (H) 65 - 99 mg/dL   BUN 5 (L) 6 - 20 mg/dL   Creatinine, Ser 0.83 0.44 - 1.00 mg/dL   Calcium 9.0 8.9 - 10.3 mg/dL   GFR calc non Af Amer >60 >60 mL/min   GFR calc Af Amer >60 >60 mL/min    Comment: (NOTE) The eGFR has been calculated using the CKD EPI equation. This calculation has not been validated in all clinical situations. eGFR's persistently <60 mL/min signify possible Chronic Kidney Disease.    Anion gap 7 5 - 15  CBC     Status: None   Collection Time: 04/18/15  5:51 AM  Result Value Ref Range   WBC 6.9 4.0 - 10.5 K/uL   RBC 4.05 3.87 - 5.11 MIL/uL   Hemoglobin 12.3 12.0 - 15.0 g/dL   HCT 36.6 36.0 - 46.0 %   MCV 90.4 78.0 -  100.0 fL   MCH 30.4 26.0 - 34.0 pg   MCHC 33.6 30.0 - 36.0 g/dL   RDW 13.4 11.5 - 15.5 %   Platelets 258 150 - 400 K/uL    MICRO: 8/8 cdifficile +  IMAGING: 8/8 abd cx  Stomach/Bowel: There is mild circumferential wall thickening of the descending and sigmoid colon with mild adjacent inflammation compatible with colitis. There is no evidence of bowel obstruction or pneumoperitoneum. The appendix is normal.  Vascular/Lymphatic: No enlarged lymph nodes or abdominal aortic aneurysm.  Reproductive: The uterus and adnexal regions are unremarkable.  Other: A small amount of free fluid within the pelvis is noted. There is no evidence of abscess.  Musculoskeletal: No acute or suspicious abnormalities identified.  IMPRESSION: Descending and sigmoid colitis which may be pseudomembranous with recent antibiotic treatment. No evidence of bowel obstruction, pneumoperitoneum or abscess.  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 Recommend that she has urology follow up as outpatient.  if she has future symptoms, would try to use nitrofurantoin (less c-diff causing). Does not appear that she has uti at this time  Dr. VTommy Medalwill be available for questions over the weekend.

## 2015-04-19 NOTE — Care Management Important Message (Signed)
Important Message  Patient Details  Name: ROBECCA FULGHAM MRN: [id number] Date of Birth: 01/24/1971   Medicare Important Message Given:  Yes-third notification given    Haskell Flirt 04/19/2015, 10:36 AMImportant Message  Patient Details  Name: MARILENE VATH MRN: [id number] Date of Birth: 1971-04-07   Medicare Important Message Given:  Yes-third notification given    Haskell Flirt 04/19/2015, 10:36 AM

## 2015-04-19 NOTE — Progress Notes (Signed)
[facility] Progress Note   NOLA BOTKINS YOV:[id number] DOB: Jan 07, 1971 DOA: 04/13/2015 PCP: Kaleen Mask, MD  Brief narrative: Regina Patton is a 44 y.o. female with recent UTI who presented to the hospital with flank pain, dysuria nausea and vomiting. Urinary tract infection was initially treated with ciprofloxacin for 1 week. She represented to the ER in 7/29 with symptoms of dysuria and flank pain and was admitted to the hospital and started on IV Rocephin. Based on sensitivity she was discharged home on Bactrim.  On day 4 of Bactrim she developed an urticarial rash. Bactrim was discontinued and the following day she was started on Keflex. This caused a diffuse red rash and she re-presented to the hospital the following day. She states symptoms of dysuria and flank pain never resolved. In the hospital she was started on Rocephin with resolution of symptoms. But she subsequently started to develop diarrhea which was quite severe. At this point a CT of the abdomen and pelvis was done which revealed descending and sigmoid colitis. She was started on oral vancomycin. Diarrhea has resolved.   Subjective: No change in symptoms from yesterday. Continues to have nausea, lack of appetite, watery stools and suprapubic pain. Had 3 stools during the day and 2 during the night yesterday.   Assessment/Plan: Active Problems:   UTI (lower urinary tract infection)/pyelonephritis/fever 102 - cipro x 1 wk- no releif in symptoms - 7/29 Admitted and started on Rocephin and then discharged on 7/31  - 8/1- Bactrim x 4 days (M-TH)- no relief in symptoms- hives on Thursday - Keflex on Friday- diffuse red rash -Started on Macrobid 8/5 - 8/6 fever 102-ongoing flank pain and dysuria- Admitted -found to have a UA with large leukocytes and started again on Rocephin - resolution of dysuria -Urine culture from 7/24 reveals organism is sensitive to Rocephin-it was discontinued yesterday-culture thus far is  negative for this could be because she was on antibiotics up until the day of admission-  - 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   Diarrhea -Starting on 8/9- profuse -CT scan reveals descending and sigmoid -C. difficile antigen and toxin positive-Downgraded diet to clear liquids as she continues to have suprapubic pain and continues to be nauseated- 5 watery stools yesterday Have had to increase oral vancomycin to 500 4 times a day-see recommendations and ID note  Appt with PCP: Requested Code Status: Full code Family Communication:  Disposition Plan: Home when stable DVT prophylaxis: Lovenox Consultants: Procedures:  Antibiotics: Anti-infectives    Start     Dose/Rate Route Frequency Ordered Stop   04/19/15 1200  vancomycin (VANCOCIN) 50 mg/mL oral solution 500 mg  Status:  Discontinued     500 mg Oral 4 times per day 04/19/15 0916 04/19/15 1002   04/19/15 1200  vancomycin (VANCOCIN) 50 mg/mL oral solution 500 mg     500 mg Oral 4 times per day 04/19/15 1052 05/03/15 1159   04/17/15 1200  cefTRIAXone (ROCEPHIN) 1 g in dextrose 5 % 50 mL IVPB  Status:  Discontinued     1 g 100 mL/hr over 30 Minutes Intravenous Every 24 hours 04/17/15 1059 04/17/15 1640   04/16/15 1200  vancomycin (VANCOCIN) 50 mg/mL oral solution 125 mg  Status:  Discontinued     125 mg Oral 4 times per day 04/16/15 0924 04/19/15  9458   04/14/15 1400  cefTRIAXone (ROCEPHIN) 1 g in dextrose 5 % 50 mL IVPB  Status:  Discontinued     1 g 100 mL/hr over 30 Minutes Intravenous Every 24 hours 04/14/15 1159 04/16/15 0924   04/13/15 1830  imipenem-cilastatin (PRIMAXIN) 500 mg in sodium chloride 0.9 % 100 mL IVPB  Status:  Discontinued     500 mg 200 mL/hr over 30 Minutes Intravenous 4 times per day 04/13/15 1817 04/14/15 1159       Objective: Filed Weights   04/13/15 2300  Weight: 95.8 kg (211 lb 3.2 oz)    Intake/Output Summary (Last 24 hours) at 04/19/15 1329 Last data filed at 04/19/15 0905  Gross per 24 hour  Intake    240 ml  Output      0 ml  Net    240 ml     Vitals Filed Vitals:   04/18/15 0521 04/18/15 1453 04/18/15 2135 04/19/15 0534  BP: 123/85 137/79 136/87 121/73  Pulse: 81 73 69 66  Temp: 98.2 F (36.8 C) 98.1 F (36.7 C) 98 F (36.7 C) 97.9 F (36.6 C)  TempSrc: Oral Oral Oral Oral  Resp: 18 18 18 18   Height:      Weight:      SpO2: 99% 99% 100% 99%    Exam:  General:  Pt is alert, not in acute distress  HEENT: No icterus, No thrush, oral mucosa moist  Cardiovascular: regular rate and rhythm, S1/S2 No murmur  Respiratory: clear to auscultation bilaterally   Abdomen: Soft, +Bowel sounds, suprapubic tenderness, non distended, no guarding  MSK: No LE edema, cyanosis or clubbing  Data Reviewed: Basic Metabolic Panel:  Recent Labs Lab 04/13/15 1601 04/14/15 0531 04/18/15 0551  NA 137 136 141  K 3.7 3.5 3.8  CL 103 103 101  CO2 26 27 33*  GLUCOSE 91 98 104*  BUN 10 10 5*  CREATININE 0.86 0.82 0.83  CALCIUM 9.0 8.2* 9.0   Liver Function Tests:  Recent Labs Lab 04/13/15 1601 04/14/15 0531  AST 14* 17  ALT 12* 13*  ALKPHOS 28* 25*  BILITOT 0.5 0.5  PROT 7.2 6.6  ALBUMIN 3.9 3.6    Recent Labs Lab 04/13/15 1601  LIPASE 32   No results for input(s): AMMONIA in the last 168 hours. CBC:  Recent Labs Lab 04/13/15 1601 04/14/15 0531 04/18/15 0551  WBC 8.4 9.6 6.9  NEUTROABS 4.9 4.5  --   HGB 13.2 11.9* 12.3  HCT 39.0 36.4 36.6  MCV 89.0 89.2 90.4  PLT 315 257 258   Cardiac Enzymes: No results for input(s): CKTOTAL, CKMB, CKMBINDEX, TROPONINI in the last 168 hours. BNP (last 3 results) No results for input(s): BNP in the last 8760 hours.  ProBNP (last 3 results) No results for input(s): PROBNP in the last 8760 hours.  CBG: No results  for input(s): GLUCAP in the last 168 hours.  Recent Results (from the past 240 hour(s))  Urine culture     Status: None   Collection Time: 04/12/15  7:48 AM  Result Value Ref Range Status   Specimen Description URINE, CLEAN CATCH  Final   Special Requests Immunocompromised  Final   Culture   Final    9,000 COLONIES/mL INSIGNIFICANT GROWTH Performed at [facility]    Report Status 04/14/2015 FINAL  Final  Urine culture     Status: None   Collection Time: 04/13/15  4:16 PM  Result Value Ref Range Status   Specimen  Description URINE, CATHETERIZED  Final   Special Requests roceph, keflex, bactrim  Final   Culture   Final    NO GROWTH 1 DAY Performed at [facility]    Report Status 04/15/2015 FINAL  Final  C difficile quick scan w PCR reflex     Status: Abnormal   Collection Time: 04/17/15 12:20 PM  Result Value Ref Range Status   C Diff antigen POSITIVE (A) NEGATIVE Final    Comment: CRITICAL RESULT CALLED TO, READ BACK BY AND VERIFIED WITH: ESTHER EDGAL,RN [id number] @ 1450 BY J SCOTTON    C Diff toxin POSITIVE (A) NEGATIVE Final    Comment: CRITICAL RESULT CALLED TO, READ BACK BY AND VERIFIED WITH: ESTHER EDGAL,RN [id number] @ 1450 BY J SCOTTON    C Diff interpretation Positive for toxigenic C. difficile  Final    Comment: CRITICAL RESULT CALLED TO, READ BACK BY AND VERIFIED WITH: Carleene Cooper [id number] @ 1450 BY J SCOTTON      Studies: No results found.  Scheduled Meds:  Scheduled Meds: . enoxaparin (LOVENOX) injection  40 mg Subcutaneous Q24H  . lisinopril  40 mg Oral Daily   And  . hydrochlorothiazide  50 mg Oral Daily  . oxybutynin  5 mg Oral TID  . polyethylene glycol  17 g Oral Once  . vancomycin  500 mg Oral 4 times per day  . venlafaxine XR  225 mg Oral QHS   Continuous Infusions: . sodium chloride 75 mL/hr at 04/19/15 0147    Time spent on care of this patient: 45 min   Oris Calmes, MD 04/19/2015, 1:29 PM  LOS: 5 days   [facility] Office  [phone] Pager - Text Page per [email or web address] If 7PM-7AM, please contact night-coverage [email or web address]

## 2015-04-20 DIAGNOSIS — R109 Unspecified abdominal pain: Secondary | ICD-10-CM

## 2015-04-20 DIAGNOSIS — I10 Essential (primary) hypertension: Secondary | ICD-10-CM

## 2015-04-20 NOTE — Progress Notes (Signed)
[facility] Progress Note   Regina Patton SAY:[id number] DOB: 01/30/1971 DOA: 04/13/2015 PCP: Kaleen Mask, MD  Brief narrative: Regina Patton is a 44 y.o. female with recent UTI who presented to the hospital with flank pain, dysuria nausea and vomiting. Urinary tract infection was initially treated with ciprofloxacin for 1 week. She represented to the ER in 7/29 with symptoms of dysuria and flank pain and was admitted to the hospital and started on IV Rocephin. Based on sensitivity she was discharged home on Bactrim.  On day 4 of Bactrim she developed an urticarial rash. Bactrim was discontinued and the following day she was started on Keflex. This caused a diffuse red rash and she re-presented to the hospital the following day. She states symptoms of dysuria and flank pain never resolved. In the hospital she was started on Rocephin with resolution of symptoms. But she subsequently started to develop diarrhea which was quite severe. At this point a CT of the abdomen and pelvis was done which revealed descending and sigmoid colitis. She was started on oral vancomycin. Diarrhea has resolved.   Subjective: Diarrhea has resolved but pain continues. No nausea but also no appetite.    Assessment/Plan: Active Problems:   UTI (lower urinary tract infection)/pyelonephritis/fever 102 - cipro x 1 wk- no releif in symptoms - 7/29 Admitted and started on Rocephin and then discharged on 7/31  - 8/1- Bactrim x 4 days (M-TH)- no relief in symptoms- hives on Thursday - Keflex on Friday- diffuse red rash -Started on Macrobid 8/5 - 8/6 fever 102-ongoing flank pain and dysuria- Admitted -found to have a UA with large leukocytes and started again on Rocephin - resolution of dysuria -Urine culture from 7/24 reveals organism is sensitive to Rocephin-it was discontinued yesterday-culture thus far is negative for this could be because she was on antibiotics up until the day of admission-  - 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   Diarrhea -Starting on 8/9- profuse -CT scan reveals descending and sigmoid -C. difficile antigen and toxin positive-Downgraded diet to clear liquids - consulted ID- Have had to increase oral vancomycin to 500 4 times a day as she continued to have suprapubic pain and continued to be nauseated with watery diarrhea y- significant improvement in diarrhea- cont clear liquid as suprapubic pain is still severe  Appt with PCP: Requested Code Status: Full code Family Communication:  Disposition Plan: Home when stable DVT prophylaxis: Lovenox Consultants: Procedures:  Antibiotics: Anti-infectives    Start     Dose/Rate Route Frequency Ordered Stop   04/19/15 1200  vancomycin (VANCOCIN) 50 mg/mL oral solution 500 mg  Status:  Discontinued     500 mg Oral 4 times per day 04/19/15 0916 04/19/15 1002   04/19/15 1200  vancomycin (VANCOCIN) 50 mg/mL oral solution 500 mg     500 mg Oral 4 times per day 04/19/15 1052 05/03/15 1159   04/17/15 1200  cefTRIAXone (ROCEPHIN) 1 g in dextrose 5 % 50 mL IVPB  Status:  Discontinued     1 g 100 mL/hr over 30 Minutes Intravenous Every 24 hours 04/17/15 1059 04/17/15 1640   04/16/15 1200  vancomycin (VANCOCIN) 50 mg/mL oral solution 125 mg  Status:  Discontinued     125 mg Oral 4 times per day 04/16/15 0924 04/19/15 0916   04/14/15 1400  cefTRIAXone (ROCEPHIN) 1 g in dextrose 5 % 50 mL IVPB  Status:  Discontinued     1 g 100 mL/hr over 30 Minutes Intravenous Every 24 hours 04/14/15 1159 04/16/15 0924   04/13/15 1830  imipenem-cilastatin (PRIMAXIN) 500 mg in sodium chloride 0.9 % 100 mL IVPB  Status:  Discontinued     500 mg 200 mL/hr over 30 Minutes Intravenous 4 times per day 04/13/15 1817 04/14/15 1159      Objective: Filed Weights   04/13/15 2300   Weight: 95.8 kg (211 lb 3.2 oz)   No intake or output data in the 24 hours ending 04/20/15 1411   Vitals Filed Vitals:   04/19/15 0534 04/19/15 1406 04/19/15 2117 04/20/15 0439  BP: 121/73 118/86 109/58 95/67  Pulse: 66 77 76 101  Temp: 97.9 F (36.6 C) 98 F (36.7 C) 97.7 F (36.5 C) 97.6 F (36.4 C)  TempSrc: Oral Oral Oral Oral  Resp: 18 16 16 18   Height:      Weight:      SpO2: 99% 100% 100% 94%    Exam:  General:  Pt is alert, not in acute distress  HEENT: No icterus, No thrush, oral mucosa moist  Cardiovascular: regular rate and rhythm, S1/S2 No murmur  Respiratory: clear to auscultation bilaterally   Abdomen: Soft, +Bowel sounds, suprapubic tenderness, non distended, no guarding  MSK: No LE edema, cyanosis or clubbing  Data Reviewed: Basic Metabolic Panel:  Recent Labs Lab 04/13/15 1601 04/14/15 0531 04/18/15 0551  NA 137 136 141  K 3.7 3.5 3.8  CL 103 103 101  CO2 26 27 33*  GLUCOSE 91 98 104*  BUN 10 10 5*  CREATININE 0.86 0.82 0.83  CALCIUM 9.0 8.2* 9.0   Liver Function Tests:  Recent Labs Lab 04/13/15 1601 04/14/15 0531  AST 14* 17  ALT 12* 13*  ALKPHOS 28* 25*  BILITOT 0.5 0.5  PROT 7.2 6.6  ALBUMIN 3.9 3.6    Recent Labs Lab 04/13/15 1601  LIPASE 32   No results for input(s): AMMONIA in the last 168 hours. CBC:  Recent Labs Lab 04/13/15 1601 04/14/15 0531 04/18/15 0551  WBC 8.4 9.6 6.9  NEUTROABS 4.9 4.5  --   HGB 13.2 11.9* 12.3  HCT 39.0 36.4 36.6  MCV 89.0 89.2 90.4  PLT 315 257 258   Cardiac Enzymes: No results for input(s): CKTOTAL, CKMB, CKMBINDEX, TROPONINI in the last 168 hours. BNP (last 3 results) No results for input(s): BNP in the last 8760 hours.  ProBNP (last 3 results) No results for input(s): PROBNP in the last 8760 hours.  CBG: No results for input(s): GLUCAP in the last 168 hours.  Recent Results (from the past 240 hour(s))  Urine culture     Status: None   Collection Time: 04/12/15   7:48 AM  Result Value Ref Range Status   Specimen Description URINE, CLEAN CATCH  Final   Special Requests Immunocompromised  Final   Culture   Final    9,000 COLONIES/mL INSIGNIFICANT GROWTH Performed at [facility]    Report Status 04/14/2015 FINAL  Final  Urine culture     Status: None   Collection Time: 04/13/15  4:16 PM  Result Value Ref Range Status   Specimen Description URINE, CATHETERIZED  Final   Special Requests roceph, keflex, bactrim  Final   Culture   Final    NO GROWTH 1 DAY Performed at [facility]    Report Status 04/15/2015 FINAL  Final  C difficile quick scan w PCR reflex     Status: Abnormal   Collection Time: 04/17/15 12:20 PM  Result Value Ref Range Status   C Diff antigen POSITIVE (A) NEGATIVE Final    Comment: CRITICAL RESULT CALLED TO, READ BACK BY AND VERIFIED WITH: ESTHER EDGAL,RN [id number] @ 1450 BY J SCOTTON    C Diff toxin POSITIVE (A) NEGATIVE Final    Comment: CRITICAL RESULT CALLED TO, READ BACK BY AND VERIFIED WITH: ESTHER EDGAL,RN [id number] @ 1450 BY J SCOTTON    C Diff interpretation Positive for toxigenic C. difficile  Final    Comment: CRITICAL RESULT CALLED TO, READ BACK BY AND VERIFIED WITH: Carleene Cooper [id number] @ 1450 BY J SCOTTON      Studies: No results found.  Scheduled Meds:  Scheduled Meds: . enoxaparin (LOVENOX) injection  40 mg Subcutaneous Q24H  . lisinopril  40 mg Oral Daily   And  . hydrochlorothiazide  50 mg Oral Daily  . ondansetron (ZOFRAN) IV  4 mg Intravenous 4 times per day  . oxybutynin  5 mg Oral TID  . polyethylene glycol  17 g Oral Once  . vancomycin  500 mg Oral 4 times per day  . venlafaxine XR  225 mg Oral QHS   Continuous Infusions: . sodium chloride 75 mL/hr at 04/20/15 0426    Time spent on care of this patient: 35 min   Basilia Stuckert, MD 04/20/2015, 2:11 PM  LOS: 6 days   [facility] Office  [phone] Pager - Text Page per [email or web address] If 7PM-7AM, please contact  night-coverage [email or web address]

## 2015-04-21 LAB — HIV ANTIBODY (ROUTINE TESTING W REFLEX): HIV Screen 4th Generation wRfx: NONREACTIVE

## 2015-04-21 LAB — HEPATITIS C ANTIBODY: HCV Ab: <0.1 s/co ratio (ref 0.0–0.9)

## 2015-04-21 MED ORDER — VANCOMYCIN 50 MG/ML ORAL SOLUTION: 500.0000 mg | Freq: Four times a day (QID) | ORAL | Status: DC

## 2015-04-21 MED ORDER — IBUPROFEN 400 MG PO TABS
400.0000 mg | ORAL_TABLET | Freq: Four times a day (QID) | ORAL | Status: DC
  Administered 2015-04-21 – 2015-04-22 (×4): 400 mg via ORAL
  Filled 2015-04-21 (×8): qty 1

## 2015-04-21 NOTE — Progress Notes (Signed)
[facility] Progress Note   Regina Patton KVQ:[id number] DOB: 04-Aug-1971 DOA: 04/13/2015 PCP: Kaleen Mask, MD  Brief narrative: Regina Patton is a 44 y.o. female with recent UTI who presented to the hospital with flank pain, dysuria nausea and vomiting. Urinary tract infection was initially treated with ciprofloxacin for 1 week. She represented to the ER in 7/29 with symptoms of dysuria and flank pain and was admitted to the hospital and started on IV Rocephin. Based on sensitivity she was discharged home on Bactrim.  On day 4 of Bactrim she developed an urticarial rash. Bactrim was discontinued and the following day she was started on Keflex. This caused a diffuse red rash and she re-presented to the hospital the following day. She states symptoms of dysuria and flank pain never resolved. In the hospital she was started on Rocephin with resolution of symptoms. But she subsequently started to develop diarrhea which was quite severe. At this point a CT of the abdomen and pelvis was done which revealed descending and sigmoid colitis. She was started on oral vancomycin. Diarrhea has resolved.   Subjective: Continues to have suprapubic pain. Otherwise has not had diarrhea and greater than 24 hours. No complaint of nausea.   Assessment/Plan: Active Problems:   UTI (lower urinary tract infection)/pyelonephritis/fever 102 - cipro x 1 wk- no releif in symptoms - 7/29 Admitted and started on Rocephin and then discharged on 7/31  - 8/1- Bactrim x 4 days (M-TH)- no relief in symptoms- hives on Thursday - Keflex on Friday- diffuse red rash -Started on Macrobid 8/5 - 8/6 fever 102-ongoing flank pain and dysuria- Admitted -found to have a UA with large leukocytes and started again on Rocephin - resolution of dysuria -Urine culture from 7/24 reveals organism is sensitive to Rocephin-it was discontinued yesterday-culture thus far is negative for this could be because she was on antibiotics up  until the day of admission-  - 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   Diarrhea -Starting on 8/9- profuse -CT scan reveals descending and sigmoid -C. difficile antigen and toxin positive-Downgraded diet to clear liquids - consulted ID- Have had to increase oral vancomycin to 500 4 times a day as she continued to have suprapubic pain and continued to be nauseated with watery diarrhea y- significant improvement in diarrhea with no stool in greater than 24 hours- will advance to solid diet and hopefully discharge home tomorrow  Appt with PCP: Requested Code Status: Full code Family Communication:  Disposition Plan: Home when stable DVT prophylaxis: Lovenox Consultants: Procedures:  Antibiotics: Anti-infectives    Start     Dose/Rate Route Frequency Ordered Stop   04/19/15 1200  vancomycin (VANCOCIN) 50 mg/mL oral solution 500 mg  Status:  Discontinued     500 mg Oral 4 times per day 04/19/15 0916 04/19/15 1002   04/19/15 1200  vancomycin (VANCOCIN) 50 mg/mL oral solution 500 mg     500 mg Oral 4 times per day 04/19/15 1052 05/03/15 1159   04/17/15 1200  cefTRIAXone (ROCEPHIN) 1 g in dextrose 5 % 50 mL IVPB  Status:  Discontinued     1 g 100 mL/hr over 30 Minutes Intravenous Every 24 hours 04/17/15 1059 04/17/15 1640   04/16/15 1200  vancomycin (VANCOCIN) 50 mg/mL oral solution 125 mg  Status:  Discontinued  125 mg Oral 4 times per day 04/16/15 0924 04/19/15 0916   04/14/15 1400  cefTRIAXone (ROCEPHIN) 1 g in dextrose 5 % 50 mL IVPB  Status:  Discontinued     1 g 100 mL/hr over 30 Minutes Intravenous Every 24 hours 04/14/15 1159 04/16/15 0924   04/13/15 1830  imipenem-cilastatin (PRIMAXIN) 500 mg in sodium chloride 0.9 % 100 mL IVPB  Status:  Discontinued     500 mg 200 mL/hr over 30 Minutes Intravenous 4 times  per day 04/13/15 1817 04/14/15 1159      Objective: Filed Weights   04/13/15 2300  Weight: 95.8 kg (211 lb 3.2 oz)    Intake/Output Summary (Last 24 hours) at 04/21/15 1455 Last data filed at 04/21/15 0653  Gross per 24 hour  Intake   1380 ml  Output      0 ml  Net   1380 ml     Vitals Filed Vitals:   04/20/15 0439 04/20/15 1500 04/20/15 2126 04/21/15 0539  BP: 95/67 104/48 111/66 112/61  Pulse: 101 77 76 80  Temp: 97.6 F (36.4 C) 97.8 F (36.6 C) 97.9 F (36.6 C) 98.6 F (37 C)  TempSrc: Oral Oral Oral Axillary  Resp: 18 18 17 18   Height:      Weight:      SpO2: 94% 97% 97% 98%    Exam:  General:  Pt is alert, not in acute distress  HEENT: No icterus, No thrush, oral mucosa moist  Cardiovascular: regular rate and rhythm, S1/S2 No murmur  Respiratory: clear to auscultation bilaterally   Abdomen: Soft, +Bowel sounds, suprapubic tenderness, non distended, no guarding  MSK: No LE edema, cyanosis or clubbing  Data Reviewed: Basic Metabolic Panel:  Recent Labs Lab 04/18/15 0551  NA 141  K 3.8  CL 101  CO2 33*  GLUCOSE 104*  BUN 5*  CREATININE 0.83  CALCIUM 9.0   Liver Function Tests: No results for input(s): AST, ALT, ALKPHOS, BILITOT, PROT, ALBUMIN in the last 168 hours. No results for input(s): LIPASE, AMYLASE in the last 168 hours. No results for input(s): AMMONIA in the last 168 hours. CBC:  Recent Labs Lab 04/18/15 0551  WBC 6.9  HGB 12.3  HCT 36.6  MCV 90.4  PLT 258   Cardiac Enzymes: No results for input(s): CKTOTAL, CKMB, CKMBINDEX, TROPONINI in the last 168 hours. BNP (last 3 results) No results for input(s): BNP in the last 8760 hours.  ProBNP (last 3 results) No results for input(s): PROBNP in the last 8760 hours.  CBG: No results for input(s): GLUCAP in the last 168 hours.  Recent Results (from the past 240 hour(s))  Urine culture     Status: None   Collection Time: 04/12/15  7:48 AM  Result Value Ref Range Status    Specimen Description URINE, CLEAN CATCH  Final   Special Requests Immunocompromised  Final   Culture   Final    9,000 COLONIES/mL INSIGNIFICANT GROWTH Performed at [facility]    Report Status 04/14/2015 FINAL  Final  Urine culture     Status: None   Collection Time: 04/13/15  4:16 PM  Result Value Ref Range Status   Specimen Description URINE, CATHETERIZED  Final   Special Requests roceph, keflex, bactrim  Final   Culture   Final    NO GROWTH 1 DAY Performed at [facility]    Report Status 04/15/2015 FINAL  Final  C difficile quick scan w PCR reflex  Status: Abnormal   Collection Time: 04/17/15 12:20 PM  Result Value Ref Range Status   C Diff antigen POSITIVE (A) NEGATIVE Final    Comment: CRITICAL RESULT CALLED TO, READ BACK BY AND VERIFIED WITH: ESTHER EDGAL,RN [id number] @ 1450 BY J SCOTTON    C Diff toxin POSITIVE (A) NEGATIVE Final    Comment: CRITICAL RESULT CALLED TO, READ BACK BY AND VERIFIED WITH: ESTHER EDGAL,RN [id number] @ 1450 BY J SCOTTON    C Diff interpretation Positive for toxigenic C. difficile  Final    Comment: CRITICAL RESULT CALLED TO, READ BACK BY AND VERIFIED WITH: Carleene Cooper [id number] @ 1450 BY J SCOTTON      Studies: No results found.  Scheduled Meds:  Scheduled Meds: . enoxaparin (LOVENOX) injection  40 mg Subcutaneous Q24H  . lisinopril  40 mg Oral Daily   And  . hydrochlorothiazide  50 mg Oral Daily  . ibuprofen  400 mg Oral QID  . ondansetron (ZOFRAN) IV  4 mg Intravenous 4 times per day  . polyethylene glycol  17 g Oral Once  . vancomycin  500 mg Oral 4 times per day  . venlafaxine XR  225 mg Oral QHS   Continuous Infusions: . sodium chloride 75 mL/hr at 04/21/15 1950    Time spent on care of this patient: 35 min   Prudencio Velazco, MD 04/21/2015, 2:55 PM  LOS: 7 days   [facility] Office  [phone] Pager - Text Page per [email or web address] If 7PM-7AM, please contact night-coverage  [email or web address]

## 2015-04-22 MED ORDER — ONDANSETRON HCL 4 MG PO TABS: 4.0000 mg | ORAL_TABLET | Freq: Four times a day (QID) | ORAL | Status: DC | PRN

## 2015-04-22 MED ORDER — VANCOMYCIN 50 MG/ML ORAL SOLUTION: 500.0000 mg | Freq: Four times a day (QID) | ORAL | Status: DC

## 2015-04-22 NOTE — Assessment & Plan Note (Signed)
Her C. difficile colitis is responding well to oral vancomycin and discontinuation of systemic antibiotics. I would continue oral vancomycin for 9 more days giving her 2 weeks of therapy after discontinuation of systemic antibiotics. I will sign off now.

## 2015-04-22 NOTE — Assessment & Plan Note (Signed)
Her C. difficile colitis is responding well to oral vancomycin and discontinuation of systemic antibiotics. I would continue oral vancomycin for 9 more days giving her 2 weeks of therapy after discontinuation of systemic antibiotics. I will sign off now. 

## 2015-04-22 NOTE — Discharge Summary (Signed)
Physician Discharge Summary  Regina Patton TTS:[id number] DOB: [birth year] DOA: 04/13/2015  PCP: Kaleen Mask, MD  Admit date: 04/13/2015 Discharge date: 04/22/2015  Time spent: 55 minutes  Recommendations for Outpatient Follow-up:  1. Avoid antibiotics for non-symptomatic pyuria- if antibiotic given, should also prophylactic give PO Vancomycin to be extended for 1 wk after antibiotic course complete to prevent recurrence of c diff  Discharge Condition: stable  Discharge Diagnoses:  Active Problems:   C. difficile colitis Frequent urinary tract infections  History of present illness:  Regina Patton is a 44 y.o. female with recent UTI who presented to the hospital with flank pain, dysuria nausea and vomiting. Urinary tract infection was initially treated with ciprofloxacin for 1 week. She represented to the ER in 7/29 with symptoms of dysuria and flank pain and was admitted to the hospital and started on IV Rocephin. Based on sensitivities she was discharged home on Bactrim. On day 4 of Bactrim she developed an urticarial rash. Bactrim was discontinued and the following day she was started on Keflex. This caused a diffuse red rash and she re-presented to the hospital the following day. She states symptoms of dysuria and flank pain never resolved. In the hospital she was started on Rocephin with resolution of symptoms. But she subsequently started to develop diarrhea which was quite severe. At this point a CT of the abdomen and pelvis was done which revealed descending and sigmoid colitis. She was started on oral vancomycin.     Hospital Course:  Active Problems:  UTI (lower urinary tract infection)/pyelonephritis/fever 102 - cipro x 1 wk- no releif in symptoms - 7/29 Admitted and started on Rocephin and then discharged on 7/31  - 8/1- Bactrim x 4 days (M-TH)- no relief in symptoms- hives on Thursday - Keflex on Friday- diffuse red rash -Started on Macrobid 8/5 - 8/6 fever  102-ongoing flank pain and dysuria- Admitted -found to have a UA with large leukocytes and started again on Rocephin - resolution of dysuria -Urine culture from 7/24 reveals organism is sensitive to Rocephin-it was discontinued yesterday-culture thus far is negative for this could be because she was on antibiotics up until the day of admission-  - 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    C diff colitis Diarrhea starting on 8/9- profuse -CT scan reveals descending and sigmoid -C. difficile antigen and toxin as active- continued to have diarrhea despite multiple days of vancomycin 125 mg 4 times a day- - - - appreciate ID consult-Have had to increase oral vancomycin to 500 4 times a day per ID recommendations starting on 8/13 as she continued to have suprapubic pain and continued to be nauseated with watery diarrhea - significant improvement in diarrhea with no stool in greater than 48 hours- tolerating solid diet - mild suprapubic pain -Prescribing a total of 14 days of vancomycin 500 mg 4 times a day   Consultations:  ID  Discharge Exam: Filed Weights   04/13/15 2300  Weight: 95.8 kg (211 lb 3.2 oz)   Filed Vitals:   04/22/15 0600  BP: 112/64  Pulse: 76  Temp: 97.8 F (36.6 C)  Resp: 17    General: AAO x 3, no distress Cardiovascular: RRR, no murmurs  Respiratory: clear to auscultation bilaterally GI: soft, mild suprapubic tenderness, non-distended, bowel sound positive  Discharge Instructions  You were cared for by a hospitalist during your hospital stay. If you have any questions about your discharge medications or the care you received while you were in the hospital after you are discharged, you can call the unit and asked to speak with the hospitalist on call if the hospitalist that took care of you is  not available. Once you are discharged, your primary care physician will handle any further medical issues. Please note that NO REFILLS for any discharge medications will be authorized once you are discharged, as it is imperative that you return to your primary care physician (or establish a relationship with a primary care physician if you do not have one) for your aftercare needs so that they can reassess your need for medications and monitor your lab values.  Discharge Instructions    Discharge instructions    Complete by:  As directed   Low fiber diet See Dr Jeannetta Nap next week.     Increase activity slowly    Complete by:  As directed             Medication List    STOP taking these medications        cephALEXin 500 MG capsule  Commonly known as:  KEFLEX     nitrofurantoin (macrocrystal-monohydrate) 100 MG capsule  Commonly known as:  MACROBID      TAKE these medications        acetaminophen 500 MG tablet  Commonly known as:  TYLENOL  Take 1,000 mg by mouth every 6 (six) hours as needed for fever.     diphenhydrAMINE 25 MG tablet  Commonly known as:  BENADRYL  Take 1 tablet (25 mg total) by mouth every 4 (four) hours as needed for itching.     lisinopril-hydrochlorothiazide 20-25 MG per tablet  Commonly known as:  PRINZIDE,ZESTORETIC  Take 2 tablets by mouth every morning.     ondansetron 4 MG tablet  Commonly known as:  ZOFRAN  Take 1 tablet (4 mg total) by mouth every 6 (six) hours as needed for nausea or vomiting.     vancomycin 50 mg/mL oral solution  Commonly known as:  VANCOCIN  Take 10 mLs (500 mg total) by mouth every 6 (six) hours.     venlafaxine XR 75 MG 24 hr capsule  Commonly known as:  EFFEXOR-XR  Take 225 mg by mouth at bedtime.       Allergies  Allergen Reactions  . Mango Flavor Anaphylaxis and Swelling    Lips swelling  . Penicillins Anaphylaxis and Hives  . Tramadol Nausea And Vomiting  . Bactrim [Sulfamethoxazole-Trimethoprim] Itching and  Rash  . Keflex [Cephalexin] Hives, Itching and Rash  . Toradol [Ketorolac Tromethamine] Rash       Follow-up Information    Follow up with MACDIARMID,SCOTT A, MD On 04/26/2015.   Specialty:  Urology   Why:  The office will call you with a follow up appointment   Contact information:   [address] [place] [address] [phone]       Follow up with [facility] On 06/14/2015.   Why:  Please keep this appointment.   Contact information:   [address] [place] [phone]       The results of significant diagnostics from this hospitalization (including imaging, microbiology, ancillary and laboratory) are listed below for reference.    Significant Diagnostic Studies: US Abdomen Complete  04/14/2015   CLINICAL DATA:  Abdominal pain.  History of bladder surgery.  EXAM: ULTRASOUND ABDOMEN COMPLETE  COMPARISON:  CT, 03/31/2015  FINDINGS: Gallbladder: Multiple mobile dependent gallstones. No wall thickening. No pericholecystic fluid.  Common bile duct: Diameter: 3.7 mm  Liver: Coarsened echotexture. No mass or focal lesion. Normal hepatopetal flow in the portal vein.  IVC: No abnormality visualized.  Pancreas: Visualized portion unremarkable.  Spleen: Size and appearance within normal limits.  Right Kidney: Length: 13.2 cm. Normal echogenicity. Extrarenal pelvis but no hydronephrosis. No masses.  Left Kidney: Length: 13.3 cm. 16 mm lower pole cyst. Normal parenchymal echogenicity. No hydronephrosis.  Abdominal aorta: No aneurysm visualized.  Other findings: None.  IMPRESSION: 1. No acute findings.  No findings to explain abdominal pain. 2. Coarsened echotexture to the liver most likely due to hepatic steatosis. 3. Extrarenal pelvis on the right, but it fro cyst. Small left renal cyst.   Electronically Signed   By: Amie Portland M.D.   On: 04/14/2015 12:34   Ct Abdomen Pelvis W Contrast  04/15/2015   CLINICAL DATA:  44 year old female with  flank and back pain, nausea, and vomiting for 1 month. Fever for 2 days. Recently on antibiotics.  EXAM: CT ABDOMEN AND PELVIS WITH CONTRAST  TECHNIQUE: Multidetector CT imaging of the abdomen and pelvis was performed using the standard protocol following bolus administration of intravenous contrast.  CONTRAST:  OMNIPAQUE IOHEXOL 300 MG/ML  SOLN  COMPARISON:  03/31/2015 and prior CTs  FINDINGS: Lower chest:  Unremarkable  Hepatobiliary: The gallbladder and liver are unremarkable. There is no evidence of biliary dilatation.  Pancreas: Unremarkable  Spleen: Unremarkable  Adrenals/Urinary Tract: The kidneys, adrenal glands and bladder are unremarkable.  Stomach/Bowel: There is mild circumferential wall thickening of the descending and sigmoid colon with mild adjacent inflammation compatible with colitis. There is no evidence of bowel obstruction or pneumoperitoneum. The appendix is normal.  Vascular/Lymphatic: No enlarged lymph nodes or abdominal aortic aneurysm.  Reproductive: The uterus and adnexal regions are unremarkable.  Other: A small amount of free fluid within the pelvis is noted. There is no evidence of abscess.  Musculoskeletal: No acute or suspicious abnormalities identified.  IMPRESSION: Descending and sigmoid colitis which may be pseudomembranous with recent antibiotic treatment. No evidence of bowel obstruction, pneumoperitoneum or abscess.   Electronically Signed   By: Harmon Pier M.D.   On: 04/15/2015 16:39   Ct Abdomen Pelvis W Contrast  03/31/2015   CLINICAL DATA:  Bilateral flank pain and dysuria for 1 week  EXAM: CT ABDOMEN AND PELVIS WITH CONTRAST  TECHNIQUE: Multidetector CT imaging of the abdomen and pelvis was performed using the standard protocol following bolus administration of intravenous contrast.  CONTRAST:  OMNIPAQUE IOHEXOL 300 MG/ML  SOLN  COMPARISON:  February 23, 2015  FINDINGS: Lung bases are clear.  No focal liver lesions are identified. The gallbladder wall is not  appreciably thickened. There is no biliary duct dilatation.  Spleen, pancreas, and adrenals appear normal.  There is a cyst in the posterior lower pole left kidney measuring 1.5 x 1.5 cm. There is no other renal mass. There is no inflammatory focus in either kidney. There is no hydronephrosis. There is an extrarenal pelvis on each side, an anatomic variant. There is no renal or ureteral calculus on either side.  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 They are best seen on axial slices 91, 92, and 93 series 2 as well as on coronal slice is 46, 47, and 48 series 3.  Urinary bladder is midline with normal wall thickness. There is no pelvic mass or pelvic fluid collection. Appendix appears normal.  There is no bowel obstruction. No free air or portal venous air. There is no demonstrable ascites, adenopathy, or abscess in the abdomen or pelvis. Aorta shows no evidence of aneurysm. There are no blastic or lytic bone lesions.  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 Infection within these diverticula cannot be excluded.  There is no evidence of inflammation in either kidney. No hydronephrosis on either side. No renal or ureteral calculi.  No bowel obstruction.  No abscess.  Appendix appears normal.   Electronically Signed   By: Bretta Bang III M.D.   On: 03/31/2015 18:27    Microbiology: Recent Results (from the past 240 hour(s))  Urine culture     Status: None   Collection Time: 04/13/15  4:16 PM  Result Value Ref Range Status   Specimen Description URINE, CATHETERIZED  Final   Special Requests roceph, keflex, bactrim  Final   Culture   Final    NO GROWTH 1 DAY Performed at [facility]    Report Status 04/15/2015 FINAL  Final  C difficile quick scan w PCR reflex     Status: Abnormal   Collection Time: 04/17/15 12:20 PM  Result Value Ref Range Status   C Diff antigen POSITIVE (A) NEGATIVE Final    Comment: CRITICAL  RESULT CALLED TO, READ BACK BY AND VERIFIED WITH: ESTHER EDGAL,RN [id number] @ 1450 BY J SCOTTON    C Diff toxin POSITIVE (A) NEGATIVE Final    Comment: CRITICAL RESULT CALLED TO, READ BACK BY AND VERIFIED WITH: ESTHER EDGAL,RN [id number] @ 1450 BY J SCOTTON    C Diff interpretation Positive for toxigenic C. difficile  Final    Comment: CRITICAL RESULT CALLED TO, READ BACK BY AND VERIFIED WITH: ESTHER EDGAL,RN [id number] @ 1450 BY J SCOTTON      Labs: Basic Metabolic Panel:  Recent Labs Lab 04/18/15 0551  NA 141  K 3.8  CL 101  CO2 33*  GLUCOSE 104*  BUN 5*  CREATININE 0.83  CALCIUM 9.0   Liver Function Tests: No results for input(s): AST, ALT, ALKPHOS, BILITOT, PROT, ALBUMIN in the last 168 hours. No results for input(s): LIPASE, AMYLASE in the last 168 hours. No results for input(s): AMMONIA in the last 168 hours. CBC:  Recent Labs Lab 04/18/15 0551  WBC 6.9  HGB 12.3  HCT 36.6  MCV 90.4  PLT 258   Cardiac Enzymes: No results for input(s): CKTOTAL, CKMB, CKMBINDEX, TROPONINI in the last 168 hours. BNP: BNP (last 3 results) No results for input(s): BNP in the last 8760 hours.  ProBNP (last 3 results) No results for input(s): PROBNP in the last 8760 hours.  CBG: No results for input(s): GLUCAP in the last 168 hours.     SignedCalvert Cantor, MD [facility] 04/22/2015, 9:50 AM

## 2015-04-22 NOTE — Progress Notes (Addendum)
Patient ID: Regina Patton, female   DOB: [birth year], 44 y.o.   MRN: [id number]         [facility] for Infectious Disease    Date of Admission:  04/13/2015           Day 7 oral vancomycin        Off systemic antibiotics  5 days  Principal Problem:   C. difficile colitis   . enoxaparin (LOVENOX) injection  40 mg Subcutaneous Q24H  . lisinopril  40 mg Oral Daily   And  . hydrochlorothiazide  50 mg Oral Daily  . ibuprofen  400 mg Oral QID  . ondansetron (ZOFRAN) IV  4 mg Intravenous 4 times per day  . polyethylene glycol  17 g Oral Once  . vancomycin  500 mg Oral 4 times per day  . venlafaxine XR  225 mg Oral QHS    Subjective: She is feeling much better and eager to go home. She's not had any diarrhea in the past 48 hours. She still has some mild nausea and abdominal cramping but has not had any vomiting. She is eating a regular diet without difficulty. She denies any dysuria.  Review of Systems: Pertinent items are noted in HPI.  Past Medical History  Diagnosis Date  . Hypertension   . Chronic back pain   . Sciatic pain     right  . Rib pain on right side     chronic  . Chronic neck pain   . Cervical radiculopathy   . Chronic knee pain   . Rheumatoid arthritis     "Rhematoid factor positive but no symptoms" per EPIC notes 2012  . Fibromyalgia   . Diabetes mellitus without complication     Social History  Substance Use Topics  . Smoking status: Never Smoker   . Smokeless tobacco: None  . Alcohol Use: No    History reviewed. No pertinent family history. Allergies  Allergen Reactions  . Mango Flavor Anaphylaxis and Swelling    Lips swelling  . Penicillins Anaphylaxis and Hives  . Tramadol Nausea And Vomiting  . Bactrim [Sulfamethoxazole-Trimethoprim] Itching and Rash  . Keflex [Cephalexin] Hives, Itching and Rash  . Toradol [Ketorolac Tromethamine] Rash    OBJECTIVE: Filed Vitals:   04/21/15 0539 04/21/15 1500 04/21/15 2119 04/22/15 0600  BP:  112/61 103/61 102/51 112/64  Pulse: 80 85 88 76  Temp: 98.6 F (37 C) 98 F (36.7 C) 98.3 F (36.8 C) 97.8 F (36.6 C)  TempSrc: Axillary Oral Oral Oral  Resp: 18 18 17 17   Height:      Weight:      SpO2: 98% 97% 96% 99%   Body mass index is 41.25 kg/(m^2).  General: She appears comfortable and in no distress. She is watching television Abdomen: Soft with mild left lower quadrant tenderness. Active bowel sounds.  Lab Results Lab Results  Component Value Date   WBC 6.9 04/18/2015   HGB 12.3 04/18/2015   HCT 36.6 04/18/2015   MCV 90.4 04/18/2015   PLT 258 04/18/2015    Lab Results  Component Value Date   CREATININE 0.83 04/18/2015   BUN 5* 04/18/2015   NA 141 04/18/2015   K 3.8 04/18/2015   CL 101 04/18/2015   CO2 33* 04/18/2015    Lab Results  Component Value Date   ALT 13* 04/14/2015   AST 17 04/14/2015   ALKPHOS 25* 04/14/2015   BILITOT 0.5 04/14/2015     Microbiology: Recent Results (from  the past 240 hour(s))  Urine culture     Status: None   Collection Time: 04/13/15  4:16 PM  Result Value Ref Range Status   Specimen Description URINE, CATHETERIZED  Final   Special Requests roceph, keflex, bactrim  Final   Culture   Final    NO GROWTH 1 DAY Performed at [facility]    Report Status 04/15/2015 FINAL  Final  C difficile quick scan w PCR reflex     Status: Abnormal   Collection Time: 04/17/15 12:20 PM  Result Value Ref Range Status   C Diff antigen POSITIVE (A) NEGATIVE Final    Comment: CRITICAL RESULT CALLED TO, READ BACK BY AND VERIFIED WITH: ESTHER EDGAL,RN [id number] @ 1450 BY J SCOTTON    C Diff toxin POSITIVE (A) NEGATIVE Final    Comment: CRITICAL RESULT CALLED TO, READ BACK BY AND VERIFIED WITH: ESTHER EDGAL,RN [id number] @ 1450 BY J SCOTTON    C Diff interpretation Positive for toxigenic C. difficile  Final    Comment: CRITICAL RESULT CALLED TO, READ BACK BY AND VERIFIED WITH: Carleene Cooper [id number] @ 1450 BY J SCOTTON     Problem  List Items Addressed This Visit      Unprioritized   RESOLVED: UTI (lower urinary tract infection) - Primary    Her C. difficile colitis is responding well to oral vancomycin and discontinuation of systemic antibiotics. I would continue oral vancomycin for 9 more days giving her 2 weeks of therapy after discontinuation of systemic antibiotics. I will sign off now.      Relevant Medications   phenazopyridine (PYRIDIUM) tablet 200 mg (Completed)   vancomycin (VANCOCIN) 50 mg/mL oral solution 500 mg   vancomycin (VANCOCIN) 50 mg/mL oral solution    Other Visit Diagnoses    Abdominal pain        Relevant Orders    US Abdomen Complete (Completed)    Abdominal pain, acute        Relevant Orders    CT Abdomen Pelvis W Contrast (Completed)        Cliffton Asters, MD [facility] [phone] pager   [phone] cell 04/22/2015, 10:01 AM

## 2015-04-25 ENCOUNTER — Encounter (HOSPITAL_COMMUNITY): Payer: Self-pay | Admitting: Emergency Medicine

## 2015-04-25 ENCOUNTER — Emergency Department (HOSPITAL_COMMUNITY)
Admission: EM | Admit: 2015-04-25 | Discharge: 2015-04-25 | Disposition: A | Discharge status: Home / Self Care | Payer: Medicare Other | Attending: Emergency Medicine | Admitting: Emergency Medicine

## 2015-04-25 DIAGNOSIS — E119 Type 2 diabetes mellitus without complications: Secondary | ICD-10-CM | POA: Diagnosis not present

## 2015-04-25 DIAGNOSIS — G8929 Other chronic pain: Secondary | ICD-10-CM | POA: Insufficient documentation

## 2015-04-25 DIAGNOSIS — I10 Essential (primary) hypertension: Secondary | ICD-10-CM | POA: Diagnosis not present

## 2015-04-25 DIAGNOSIS — Z8739 Personal history of other diseases of the musculoskeletal system and connective tissue: Secondary | ICD-10-CM | POA: Insufficient documentation

## 2015-04-25 DIAGNOSIS — Z880 Allergy status to penicillin: Secondary | ICD-10-CM | POA: Diagnosis not present

## 2015-04-25 DIAGNOSIS — A047 Enterocolitis due to Clostridium difficile: Secondary | ICD-10-CM | POA: Diagnosis not present

## 2015-04-25 DIAGNOSIS — R1032 Left lower quadrant pain: Secondary | ICD-10-CM | POA: Diagnosis present

## 2015-04-25 DIAGNOSIS — A0472 Enterocolitis due to Clostridium difficile, not specified as recurrent: Secondary | ICD-10-CM

## 2015-04-25 MED ORDER — OXYCODONE-ACETAMINOPHEN 5-325 MG PO TABS: 2.0000 | ORAL_TABLET | Freq: Once | ORAL | Status: DC

## 2015-04-25 MED ORDER — METRONIDAZOLE 500 MG PO TABS: 500.0000 mg | ORAL_TABLET | Freq: Three times a day (TID) | ORAL | Status: AC

## 2015-04-25 MED ORDER — ONDANSETRON 4 MG PO TBDP: 4.0000 mg | ORAL_TABLET | Freq: Once | ORAL | Status: DC

## 2015-04-25 MED ORDER — MORPHINE SULFATE (PF) 4 MG/ML IV SOLN
6.0000 mg | Freq: Once | INTRAVENOUS | Status: AC
  Administered 2015-04-25: 6 mg via INTRAVENOUS
  Filled 2015-04-25: qty 2

## 2015-04-25 MED ORDER — OXYCODONE-ACETAMINOPHEN 5-325 MG PO TABS: 1.0000 | ORAL_TABLET | Freq: Three times a day (TID) | ORAL | Status: DC | PRN

## 2015-04-25 MED ORDER — SODIUM CHLORIDE 0.9 % IV BOLUS (SEPSIS)
1000.0000 mL | Freq: Once | INTRAVENOUS | Status: AC
  Administered 2015-04-25: 1000 mL via INTRAVENOUS

## 2015-04-25 MED ORDER — ONDANSETRON HCL 4 MG/2ML IJ SOLN
4.0000 mg | Freq: Once | INTRAMUSCULAR | Status: AC
  Administered 2015-04-25: 4 mg via INTRAVENOUS
  Filled 2015-04-25: qty 2

## 2015-04-25 NOTE — ED Notes (Signed)
Pt states that she was admitted for UTI and developed CDiff.  Was d/c with Vancomycin rx and told that insurance would not cover it so she has not had any meds in 2 days.  States that she is having abd pain and diarrhea.

## 2015-04-25 NOTE — ED Notes (Signed)
Pt made aware to return if symptoms worsen or if any life threatening symptoms occur.   

## 2015-04-25 NOTE — ED Provider Notes (Signed)
CSN: [id number]     Arrival date & time 04/25/15  1305 History   First MD Initiated Contact with Patient 04/25/15 1325     Chief Complaint  Patient presents with  . Abdominal Pain     (Consider location/radiation/quality/duration/timing/severity/associated sxs/prior Treatment) HPI   Recently contracted C. difficile infection while in the hospital however not able to obtain her by mouth vancomycin at home secondary to financial reasons. So has not had medication in 3 days and over the last day she has developed diarrhea again along with left lower quadrant abdominal pain and some nausea. No fevers pain is similar to previous C. difficile flare no different. No blood in diarrhea. No evidence of severe infection at home. Does not really radiate anywhere and is constant sharp in nature. No other new symptoms.  Past Medical History  Diagnosis Date  . Hypertension   . Chronic back pain   . Sciatic pain     right  . Rib pain on right side     chronic  . Chronic neck pain   . Cervical radiculopathy   . Chronic knee pain   . Rheumatoid arthritis     "Rhematoid factor positive but no symptoms" per EPIC notes 2012  . Fibromyalgia   . Diabetes mellitus without complication    Past Surgical History  Procedure Laterality Date  . Tubal ligation    . Bladder surgery     History reviewed. No pertinent family history. Social History  Substance Use Topics  . Smoking status: Never Smoker   . Smokeless tobacco: None  . Alcohol Use: No   OB History    Gravida Para Term Preterm AB TAB SAB Ectopic Multiple Living            3     Review of Systems  Constitutional: Negative for fever, chills and fatigue.  Eyes: Negative for pain.  Gastrointestinal: Positive for nausea, abdominal pain and diarrhea. Negative for vomiting, blood in stool and rectal pain.  Endocrine: Negative for polyuria.  Genitourinary: Negative for dysuria and flank pain.  All other systems reviewed and are  negative.     Allergies  Mango flavor; Penicillins; Tramadol; Bactrim; Keflex; and Toradol  Home Medications   Prior to Admission medications   Medication Sig Start Date End Date Taking? Authorizing Provider  lisinopril-hydrochlorothiazide (PRINZIDE,ZESTORETIC) 20-25 MG per tablet Take 2 tablets by mouth every morning.    Yes Historical Provider, MD  ondansetron (ZOFRAN) 4 MG tablet Take 1 tablet (4 mg total) by mouth every 6 (six) hours as needed for nausea or vomiting. 04/22/15  Yes Calvert Cantor, MD  vancomycin (VANCOCIN) 50 mg/mL oral solution Take 10 mLs (500 mg total) by mouth every 6 (six) hours. 04/22/15  Yes Calvert Cantor, MD  venlafaxine XR (EFFEXOR-XR) 75 MG 24 hr capsule Take 225 mg by mouth at bedtime.    Yes Historical Provider, MD  diphenhydrAMINE (BENADRYL) 25 MG tablet Take 1 tablet (25 mg total) by mouth every 4 (four) hours as needed for itching. Patient not taking: Reported on 04/25/2015 04/11/15   Tammy Triplett, PA-C  metroNIDAZOLE (FLAGYL) 500 MG tablet Take 1 tablet (500 mg total) by mouth 3 (three) times daily. 04/25/15 05/09/15  Marily Memos, MD  oxyCODONE-acetaminophen (PERCOCET/ROXICET) 5-325 MG per tablet Take 1-2 tablets by mouth every 8 (eight) hours as needed for severe pain. 04/25/15   Marily Memos, MD   BP 114/81 mmHg  Pulse 95  Temp(Src) 98.8 F (37.1 C) (Oral)  Resp 18  Ht 5' (1.524 m)  Wt 198 lb (89.812 kg)  BMI 38.67 kg/m2  SpO2 93%  LMP 04/15/2015 Physical Exam  Constitutional: She is oriented to person, place, and time. She appears well-developed and well-nourished.  HENT:  Head: Normocephalic and atraumatic.  Eyes: Conjunctivae and EOM are normal. Right eye exhibits no discharge. Left eye exhibits no discharge.  Cardiovascular: Normal rate and regular rhythm.   Pulmonary/Chest: Effort normal and breath sounds normal. No respiratory distress.  Abdominal: Soft. She exhibits no distension. There is tenderness (left lower quadrant without rebound. Is  soft.). There is no rebound.  Musculoskeletal: Normal range of motion. She exhibits no edema or tenderness.  Neurological: She is alert and oriented to person, place, and time.  Skin: Skin is warm and dry.  Nursing note and vitals reviewed.   ED Course  Procedures (including critical care time) Labs Review Labs Reviewed - No data to display  Imaging Review No results found. I have personally reviewed and evaluated these images and lab results as part of my medical decision-making.   EKG Interpretation None      MDM   Final diagnoses:  C. difficile colitis   C. difficile colitis. Doubt complication as the pain is very similar to previous in her she has normal vital signs here show a doubt systemic infection. We will give her some fluids and pain medicine and some antiemetics and discharge her home with a new prescription for Flagyl if we cannot get vancomycin a cheaper price.  Patient without change in abdominal exam. I spoke with the pharmacy and vancomycin is too expensive so we'll switch her over to Flagyl. Noted or 2 day course she's been off it for couple days. Her heart rate improved with minimal fluid administration affect is likely secondary to anxiety rather than a systemic problem.  She'll follow with PCP for further management.  I have personally and contemperaneously reviewed labs and imaging and used in my decision making as above.   A medical screening exam was performed and I feel the patient has had an appropriate workup for their chief complaint at this time and likelihood of emergent condition existing is low. They have been counseled on decision, discharge, follow up and which symptoms necessitate immediate return to the emergency department. They or their family verbally stated understanding and agreement with plan and discharged in stable condition.      Marily Memos, MD 04/25/15 2155

## 2015-04-25 NOTE — Clinical Social Work Note (Signed)
CSW received consult for medication assistance. CM discussing with ED staff. Will sign off.  Derenda Fennel, LCSW [phone]

## 2015-04-29 ENCOUNTER — Encounter (HOSPITAL_COMMUNITY): Payer: Self-pay | Admitting: Emergency Medicine

## 2015-04-29 ENCOUNTER — Emergency Department (HOSPITAL_COMMUNITY): Payer: Medicare Other

## 2015-04-29 ENCOUNTER — Emergency Department (HOSPITAL_COMMUNITY)
Admission: EM | Admit: 2015-04-29 | Discharge: 2015-04-29 | Disposition: A | Discharge status: Home / Self Care | Payer: Medicare Other | Attending: Emergency Medicine | Admitting: Emergency Medicine

## 2015-04-29 DIAGNOSIS — A0472 Enterocolitis due to Clostridium difficile, not specified as recurrent: Secondary | ICD-10-CM

## 2015-04-29 DIAGNOSIS — G8929 Other chronic pain: Secondary | ICD-10-CM | POA: Insufficient documentation

## 2015-04-29 DIAGNOSIS — R109 Unspecified abdominal pain: Secondary | ICD-10-CM

## 2015-04-29 DIAGNOSIS — E119 Type 2 diabetes mellitus without complications: Secondary | ICD-10-CM | POA: Insufficient documentation

## 2015-04-29 DIAGNOSIS — R1030 Lower abdominal pain, unspecified: Secondary | ICD-10-CM | POA: Diagnosis present

## 2015-04-29 DIAGNOSIS — Z8739 Personal history of other diseases of the musculoskeletal system and connective tissue: Secondary | ICD-10-CM | POA: Diagnosis not present

## 2015-04-29 DIAGNOSIS — Z880 Allergy status to penicillin: Secondary | ICD-10-CM | POA: Insufficient documentation

## 2015-04-29 DIAGNOSIS — A047 Enterocolitis due to Clostridium difficile: Secondary | ICD-10-CM | POA: Insufficient documentation

## 2015-04-29 DIAGNOSIS — I10 Essential (primary) hypertension: Secondary | ICD-10-CM | POA: Insufficient documentation

## 2015-04-29 DIAGNOSIS — Z79899 Other long term (current) drug therapy: Secondary | ICD-10-CM | POA: Diagnosis not present

## 2015-04-29 LAB — CBC WITH DIFFERENTIAL/PLATELET
Basophils Absolute: 0 10*3/uL (ref 0.0–0.1)
Basophils Relative: 0 % (ref 0–1)
EOS PCT: 3 % (ref 0–5)
Eosinophils Absolute: 0.2 10*3/uL (ref 0.0–0.7)
HEMATOCRIT: 35.3 % — AB (ref 36.0–46.0)
Hemoglobin: 12.1 g/dL (ref 12.0–15.0)
LYMPHS PCT: 37 % (ref 12–46)
Lymphs Abs: 2.2 10*3/uL (ref 0.7–4.0)
MCH: 30.3 pg (ref 26.0–34.0)
MCHC: 34.3 g/dL (ref 30.0–36.0)
MCV: 88.5 fL (ref 78.0–100.0)
MONO ABS: 0.3 10*3/uL (ref 0.1–1.0)
MONOS PCT: 4 % (ref 3–12)
NEUTROS ABS: 3.4 10*3/uL (ref 1.7–7.7)
Neutrophils Relative %: 56 % (ref 43–77)
PLATELETS: 264 10*3/uL (ref 150–400)
RBC: 3.99 MIL/uL (ref 3.87–5.11)
RDW: 13.2 % (ref 11.5–15.5)
WBC: 6.1 10*3/uL (ref 4.0–10.5)

## 2015-04-29 LAB — COMPREHENSIVE METABOLIC PANEL
ALT: 17 U/L (ref 14–54)
ANION GAP: 8 (ref 5–15)
AST: 19 U/L (ref 15–41)
Albumin: 3.7 g/dL (ref 3.5–5.0)
Alkaline Phosphatase: 32 U/L — ABNORMAL LOW (ref 38–126)
BILIRUBIN TOTAL: 0.4 mg/dL (ref 0.3–1.2)
BUN: 7 mg/dL (ref 6–20)
CO2: 26 mmol/L (ref 22–32)
Calcium: 8.9 mg/dL (ref 8.9–10.3)
Chloride: 104 mmol/L (ref 101–111)
Creatinine, Ser: 0.7 mg/dL (ref 0.44–1.00)
Glucose, Bld: 102 mg/dL — ABNORMAL HIGH (ref 65–99)
POTASSIUM: 4 mmol/L (ref 3.5–5.1)
Sodium: 138 mmol/L (ref 135–145)
TOTAL PROTEIN: 6.8 g/dL (ref 6.5–8.1)

## 2015-04-29 LAB — URINALYSIS, ROUTINE W REFLEX MICROSCOPIC
BILIRUBIN URINE: NEGATIVE
Glucose, UA: NEGATIVE mg/dL
Hgb urine dipstick: NEGATIVE
KETONES UR: NEGATIVE mg/dL
Leukocytes, UA: NEGATIVE
NITRITE: NEGATIVE
PH: 6 (ref 5.0–8.0)
PROTEIN: NEGATIVE mg/dL
Specific Gravity, Urine: 1.025 (ref 1.005–1.030)
Urobilinogen, UA: 0.2 mg/dL (ref 0.0–1.0)

## 2015-04-29 LAB — LIPASE, BLOOD: LIPASE: 36 U/L (ref 22–51)

## 2015-04-29 LAB — I-STAT CG4 LACTIC ACID, ED: Lactic Acid, Venous: 1.69 mmol/L (ref 0.5–2.0)

## 2015-04-29 MED ORDER — SODIUM CHLORIDE 0.9 % IV BOLUS (SEPSIS)
1000.0000 mL | Freq: Once | INTRAVENOUS | Status: AC
  Administered 2015-04-29: 1000 mL via INTRAVENOUS

## 2015-04-29 MED ORDER — MORPHINE SULFATE (PF) 4 MG/ML IV SOLN
4.0000 mg | Freq: Once | INTRAVENOUS | Status: AC
  Administered 2015-04-29: 4 mg via INTRAVENOUS
  Filled 2015-04-29: qty 1

## 2015-04-29 MED ORDER — IOHEXOL 300 MG/ML  SOLN: 50.0000 mL | Freq: Once | INTRAMUSCULAR | Status: DC | PRN

## 2015-04-29 MED ORDER — IOHEXOL 300 MG/ML  SOLN
100.0000 mL | Freq: Once | INTRAMUSCULAR | Status: AC | PRN
  Administered 2015-04-29: 100 mL via INTRAVENOUS

## 2015-04-29 MED ORDER — ONDANSETRON HCL 4 MG/2ML IJ SOLN
4.0000 mg | Freq: Once | INTRAMUSCULAR | Status: AC
  Administered 2015-04-29: 4 mg via INTRAVENOUS
  Filled 2015-04-29: qty 2

## 2015-04-29 NOTE — ED Notes (Signed)
Md states that he does not want repeat Lactic Acid performed

## 2015-04-29 NOTE — ED Provider Notes (Signed)
CSN: [id number]     Arrival date & time 04/29/15  1253 History   First MD Initiated Contact with Patient 04/29/15 1304     No chief complaint on file.    (Consider location/radiation/quality/duration/timing/severity/associated sxs/prior Treatment) HPI Comments: Patient diagnosed with C. difficile earlier this month and was discharged on by mouth vancomycin. She was switched to by mouth Flagyl on August 18 financial reasons. She states over the past 2 days she's had worsening lower abdominal pain and worsening diarrhea. Diarrhea hasn't increased to 5 episodes daily from 2 episodes. It is nonbloody. Nausea but no vomiting. Fever to 101 yesterday. Worsening lower abdominal pain with decreased appetite. No urinary symptoms. No previous episodes of C. difficile. No abdominal surgeries. Taking Flagyl as prescribed. She was prescribed 14 day course started on August 18.  The history is provided by the patient.    Past Medical History  Diagnosis Date  . Hypertension   . Chronic back pain   . Sciatic pain     right  . Rib pain on right side     chronic  . Chronic neck pain   . Cervical radiculopathy   . Chronic knee pain   . Rheumatoid arthritis     "Rhematoid factor positive but no symptoms" per EPIC notes 2012  . Fibromyalgia   . Diabetes mellitus without complication    Past Surgical History  Procedure Laterality Date  . Tubal ligation    . Bladder surgery     History reviewed. No pertinent family history. Social History  Substance Use Topics  . Smoking status: Never Smoker   . Smokeless tobacco: None  . Alcohol Use: No   OB History    Gravida Para Term Preterm AB TAB SAB Ectopic Multiple Living            3     Review of Systems  Constitutional: Positive for fever, activity change, appetite change and fatigue.  HENT: Negative for congestion and rhinorrhea.   Respiratory: Negative for cough, chest tightness and shortness of breath.   Cardiovascular: Negative for chest  pain.  Gastrointestinal: Positive for nausea, abdominal pain and diarrhea. Negative for vomiting.  Genitourinary: Negative for dysuria, hematuria, vaginal bleeding and vaginal discharge.  Musculoskeletal: Negative for myalgias and arthralgias.  Skin: Negative for rash.  Neurological: Positive for weakness. Negative for dizziness and headaches.  A complete 10 system review of systems was obtained and all systems are negative except as noted in the HPI and PMH.      Allergies  Mango flavor; Penicillins; Tramadol; Bactrim; Keflex; and Toradol  Home Medications   Prior to Admission medications   Medication Sig Start Date End Date Taking? Authorizing Provider  diphenhydrAMINE (BENADRYL) 25 MG tablet Take 1 tablet (25 mg total) by mouth every 4 (four) hours as needed for itching. Patient not taking: Reported on 04/25/2015 04/11/15   Tammy Triplett, PA-C  lisinopril-hydrochlorothiazide (PRINZIDE,ZESTORETIC) 20-25 MG per tablet Take 2 tablets by mouth every morning.     Historical Provider, MD  metroNIDAZOLE (FLAGYL) 500 MG tablet Take 1 tablet (500 mg total) by mouth 3 (three) times daily. 04/25/15 05/09/15  Marily Memos, MD  ondansetron (ZOFRAN) 4 MG tablet Take 1 tablet (4 mg total) by mouth every 6 (six) hours as needed for nausea or vomiting. 04/22/15   Calvert Cantor, MD  oxyCODONE-acetaminophen (PERCOCET/ROXICET) 5-325 MG per tablet Take 1-2 tablets by mouth every 8 (eight) hours as needed for severe pain. 04/25/15   Marily Memos, MD  vancomycin (  VANCOCIN) 50 mg/mL oral solution Take 10 mLs (500 mg total) by mouth every 6 (six) hours. 04/22/15   Calvert Cantor, MD  venlafaxine XR (EFFEXOR-XR) 75 MG 24 hr capsule Take 225 mg by mouth at bedtime.     Historical Provider, MD   BP 129/88 mmHg  Pulse 89  Temp(Src) 97.9 F (36.6 C) (Oral)  Resp 18  Ht 5' (1.524 m)  Wt 198 lb (89.812 kg)  BMI 38.67 kg/m2  SpO2 98%  LMP 04/15/2015 Physical Exam  Constitutional: She is oriented to person, place, and  time. She appears well-developed and well-nourished. No distress.  HENT:  Head: Normocephalic and atraumatic.  Mouth/Throat: Oropharynx is clear and moist. No oropharyngeal exudate.  Moist mucus membranes  Eyes: Conjunctivae and EOM are normal. Pupils are equal, round, and reactive to light.  Neck: Normal range of motion. Neck supple.  No meningismus.  Cardiovascular: Normal rate, regular rhythm, normal heart sounds and intact distal pulses.   No murmur heard. Pulmonary/Chest: Effort normal and breath sounds normal. No respiratory distress.  Abdominal: Soft. There is tenderness. There is no rebound and no guarding.  TTP LLQ and suprapubic with voluntary guarding  Musculoskeletal: Normal range of motion. She exhibits no edema or tenderness.  Neurological: She is alert and oriented to person, place, and time. No cranial nerve deficit. She exhibits normal muscle tone. Coordination normal.  No ataxia on finger to nose bilaterally. No pronator drift. 5/5 strength throughout. CN 2-12 intact. Negative Romberg. Equal grip strength. Sensation intact. Gait is normal.   Skin: Skin is warm.  Psychiatric: She has a normal mood and affect. Her behavior is normal.  Nursing note and vitals reviewed.   ED Course  Procedures (including critical care time) Labs Review Labs Reviewed  CBC WITH DIFFERENTIAL/PLATELET - Abnormal; Notable for the following:    HCT 35.3 (*)    All other components within normal limits  COMPREHENSIVE METABOLIC PANEL - Abnormal; Notable for the following:    Glucose, Bld 102 (*)    Alkaline Phosphatase 32 (*)    All other components within normal limits  LIPASE, BLOOD  URINALYSIS, ROUTINE W REFLEX MICROSCOPIC (NOT AT [facility])  I-STAT CG4 LACTIC ACID, ED    Imaging Review Ct Abdomen Pelvis W Contrast  04/29/2015   CLINICAL DATA:  Abdominal pain and diarrhea  EXAM: CT ABDOMEN AND PELVIS WITH CONTRAST  TECHNIQUE: Multidetector CT imaging of the abdomen and pelvis was performed  using the standard protocol following bolus administration of intravenous contrast.  CONTRAST:  OMNIPAQUE IOHEXOL 300 MG/ML SOLN, 1 OMNIPAQUE IOHEXOL 300 MG/ML SOLN  COMPARISON:  04/15/2015  FINDINGS: Lower chest:  Lung bases are clear.  Hepatobiliary: Liver and gallbladder appear unremarkable.  Pancreas: Normal  Spleen: Normal  Adrenals/Urinary Tract: Adrenal glands are unremarkable. Bilateral extrarenal pelves incidentally noted. 1.6 cm left lower renal pole cyst reidentified. No hydroureteronephrosis. No radiopaque renal or ureteral calculus. Bladder is unremarkable.  Stomach/Bowel: No bowel wall thickening or focal segmental dilatation is identified. Normal appendix.  Vascular/Lymphatic: Mild atheromatous aortic calcification without aneurysm. No lymphadenopathy.  Other: No free air or fluid. Uterus and ovaries appear unremarkable. Possible injection sites within the anterior abdominal wall subcutaneous fat, images 79 and 84.  Musculoskeletal: No acute osseous abnormality.  IMPRESSION: Interval apparent resolution of previously seen colonic wall thickening. No new acute intra-abdominal or pelvic pathology.   Electronically Signed   By: Christiana Pellant M.D.   On: 04/29/2015 16:10   I have personally reviewed and evaluated  these images and lab results as part of my medical decision-making.   EKG Interpretation None      MDM   Final diagnoses:  Abdominal pain, unspecified abdominal location  C. difficile diarrhea   History of C. difficile colitis with worsening diarrhea and lower abdominal pain. Fever at home yesterday. IV fluids, and symptom control given.  Urinalysis negative. Labs are reassuring. Appear to be at Baseline. No leukocytosis. Lactate is normal.  One episode of diarrhea in the ED.  No vomiting.  CT shows resolution of previous colitis. No abscess or other pathology.  Tolerating PO.  Workup reassuring.  Continue flagyl as prescribed. Followup with PCP and GI.  Return  precautions discussed.  BP 129/88 mmHg  Pulse 89  Temp(Src) 97.9 F (36.6 C) (Oral)  Resp 18  Ht 5' (1.524 m)  Wt 198 lb (89.812 kg)  BMI 38.67 kg/m2  SpO2 98%  LMP 04/15/2015   Glynn Octave, MD 04/29/15 2247

## 2015-04-29 NOTE — Discharge Instructions (Signed)
Abdominal Pain Your condition is improving. Continue to take the Flagyl as prescribed. Follow-up with your doctor and the stomach doctor. Return to the ED if you develop new or worsening symptoms. Many things can cause abdominal pain. Usually, abdominal pain is not caused by a disease and will improve without treatment. It can often be observed and treated at home. Your health care provider will do a physical exam and possibly order blood tests and X-rays to help determine the seriousness of your pain. However, in many cases, more time must pass before a clear cause of the pain can be found. Before that point, your health care provider may not know if you need more testing or further treatment. HOME CARE INSTRUCTIONS  Monitor your abdominal pain for any changes. The following actions may help to alleviate any discomfort you are experiencing:  Only take over-the-counter or prescription medicines as directed by your health care provider.  Do not take laxatives unless directed to do so by your health care provider.  Try a clear liquid diet (broth, tea, or water) as directed by your health care provider. Slowly move to a bland diet as tolerated. SEEK MEDICAL CARE IF:  You have unexplained abdominal pain.  You have abdominal pain associated with nausea or diarrhea.  You have pain when you urinate or have a bowel movement.  You experience abdominal pain that wakes you in the night.  You have abdominal pain that is worsened or improved by eating food.  You have abdominal pain that is worsened with eating fatty foods.  You have a fever. SEEK IMMEDIATE MEDICAL CARE IF:   Your pain does not go away within 2 hours.  You keep throwing up (vomiting).  Your pain is felt only in portions of the abdomen, such as the right side or the left lower portion of the abdomen.  You pass bloody or black tarry stools. MAKE SURE YOU:  Understand these instructions.   Will watch your condition.   Will  get help right away if you are not doing well or get worse.  Document Released: 06/03/2005 Document Revised: 08/29/2013 Document Reviewed: 05/03/2013 [facility] Patient Information 2015 [place], [place]. This information is not intended to replace advice given to you by your health care provider. Make sure you discuss any questions you have with your health care provider.

## 2015-04-29 NOTE — ED Notes (Signed)
Discharge instructions given - verbalized understanding- requested further pain meds -- MD informed and pt informed that she needed to FU with her primary MD . Pt verbalized understanding  - Ambulated off unit

## 2015-04-29 NOTE — ED Notes (Signed)
Pt states that her diarrhea and abdominal pain are not improving since last visit.  Pt was c-diff positive.

## 2015-05-01 ENCOUNTER — Encounter: Payer: Self-pay | Admitting: Gastroenterology

## 2015-05-04 ENCOUNTER — Encounter (HOSPITAL_COMMUNITY): Payer: Self-pay | Admitting: Emergency Medicine

## 2015-05-04 ENCOUNTER — Emergency Department (HOSPITAL_COMMUNITY): Payer: Medicare Other

## 2015-05-04 ENCOUNTER — Emergency Department (HOSPITAL_COMMUNITY)
Admission: EM | Admit: 2015-05-04 | Discharge: 2015-05-04 | Disposition: A | Discharge status: Home / Self Care | Payer: Medicare Other | Attending: Emergency Medicine | Admitting: Emergency Medicine

## 2015-05-04 DIAGNOSIS — R197 Diarrhea, unspecified: Secondary | ICD-10-CM | POA: Insufficient documentation

## 2015-05-04 DIAGNOSIS — E119 Type 2 diabetes mellitus without complications: Secondary | ICD-10-CM | POA: Insufficient documentation

## 2015-05-04 DIAGNOSIS — R1084 Generalized abdominal pain: Secondary | ICD-10-CM | POA: Diagnosis not present

## 2015-05-04 DIAGNOSIS — M797 Fibromyalgia: Secondary | ICD-10-CM | POA: Diagnosis not present

## 2015-05-04 DIAGNOSIS — Z8619 Personal history of other infectious and parasitic diseases: Secondary | ICD-10-CM | POA: Insufficient documentation

## 2015-05-04 DIAGNOSIS — I10 Essential (primary) hypertension: Secondary | ICD-10-CM | POA: Insufficient documentation

## 2015-05-04 DIAGNOSIS — R1030 Lower abdominal pain, unspecified: Secondary | ICD-10-CM | POA: Diagnosis present

## 2015-05-04 DIAGNOSIS — Z880 Allergy status to penicillin: Secondary | ICD-10-CM | POA: Insufficient documentation

## 2015-05-04 DIAGNOSIS — G8929 Other chronic pain: Secondary | ICD-10-CM | POA: Insufficient documentation

## 2015-05-04 DIAGNOSIS — Z792 Long term (current) use of antibiotics: Secondary | ICD-10-CM | POA: Diagnosis not present

## 2015-05-04 DIAGNOSIS — M069 Rheumatoid arthritis, unspecified: Secondary | ICD-10-CM | POA: Insufficient documentation

## 2015-05-04 DIAGNOSIS — R109 Unspecified abdominal pain: Secondary | ICD-10-CM

## 2015-05-04 HISTORY — DX: Enterocolitis due to Clostridium difficile, not specified as recurrent: A04.72

## 2015-05-04 LAB — URINE MICROSCOPIC-ADD ON

## 2015-05-04 LAB — CBC WITH DIFFERENTIAL/PLATELET
BASOS ABS: 0 10*3/uL (ref 0.0–0.1)
BASOS PCT: 0 % (ref 0–1)
Eosinophils Absolute: 0.2 10*3/uL (ref 0.0–0.7)
Eosinophils Relative: 2 % (ref 0–5)
HEMATOCRIT: 35.9 % — AB (ref 36.0–46.0)
Hemoglobin: 12.2 g/dL (ref 12.0–15.0)
LYMPHS PCT: 33 % (ref 12–46)
Lymphs Abs: 2.4 10*3/uL (ref 0.7–4.0)
MCH: 30 pg (ref 26.0–34.0)
MCHC: 34 g/dL (ref 30.0–36.0)
MCV: 88.2 fL (ref 78.0–100.0)
MONO ABS: 0.4 10*3/uL (ref 0.1–1.0)
Monocytes Relative: 5 % (ref 3–12)
NEUTROS ABS: 4.4 10*3/uL (ref 1.7–7.7)
Neutrophils Relative %: 60 % (ref 43–77)
PLATELETS: 285 10*3/uL (ref 150–400)
RBC: 4.07 MIL/uL (ref 3.87–5.11)
RDW: 13.3 % (ref 11.5–15.5)
WBC: 7.4 10*3/uL (ref 4.0–10.5)

## 2015-05-04 LAB — URINALYSIS, ROUTINE W REFLEX MICROSCOPIC
Bilirubin Urine: NEGATIVE
Glucose, UA: NEGATIVE mg/dL
Ketones, ur: NEGATIVE mg/dL
NITRITE: NEGATIVE
Protein, ur: NEGATIVE mg/dL
SPECIFIC GRAVITY, URINE: 1.015 (ref 1.005–1.030)
UROBILINOGEN UA: 0.2 mg/dL (ref 0.0–1.0)
pH: 6 (ref 5.0–8.0)

## 2015-05-04 LAB — COMPREHENSIVE METABOLIC PANEL
ALBUMIN: 3.8 g/dL (ref 3.5–5.0)
ALT: 16 U/L (ref 14–54)
AST: 17 U/L (ref 15–41)
Alkaline Phosphatase: 30 U/L — ABNORMAL LOW (ref 38–126)
Anion gap: 6 (ref 5–15)
BILIRUBIN TOTAL: 0.4 mg/dL (ref 0.3–1.2)
BUN: 8 mg/dL (ref 6–20)
CHLORIDE: 104 mmol/L (ref 101–111)
CO2: 26 mmol/L (ref 22–32)
CREATININE: 0.74 mg/dL (ref 0.44–1.00)
Calcium: 8.8 mg/dL — ABNORMAL LOW (ref 8.9–10.3)
GFR calc Af Amer: >60 mL/min (ref >60)
GLUCOSE: 102 mg/dL — AB (ref 65–99)
POTASSIUM: 3.8 mmol/L (ref 3.5–5.1)
Sodium: 136 mmol/L (ref 135–145)
Total Protein: 7 g/dL (ref 6.5–8.1)

## 2015-05-04 LAB — LIPASE, BLOOD: Lipase: 41 U/L (ref 22–51)

## 2015-05-04 LAB — I-STAT CG4 LACTIC ACID, ED: Lactic Acid, Venous: 1.42 mmol/L (ref 0.5–2.0)

## 2015-05-04 MED ORDER — PROMETHAZINE HCL 25 MG PO TABS: 25.0000 mg | ORAL_TABLET | Freq: Four times a day (QID) | ORAL | Status: DC | PRN

## 2015-05-04 MED ORDER — OXYCODONE-ACETAMINOPHEN 5-325 MG PO TABS: 1.0000 | ORAL_TABLET | ORAL | Status: DC | PRN

## 2015-05-04 MED ORDER — ONDANSETRON 8 MG PO TBDP
8.0000 mg | ORAL_TABLET | Freq: Once | ORAL | Status: AC
  Administered 2015-05-04: 8 mg via ORAL
  Filled 2015-05-04: qty 1

## 2015-05-04 MED ORDER — MORPHINE SULFATE (PF) 4 MG/ML IV SOLN
4.0000 mg | Freq: Once | INTRAVENOUS | Status: AC
  Administered 2015-05-04: 4 mg via INTRAVENOUS
  Filled 2015-05-04: qty 1

## 2015-05-04 MED ORDER — DIPHENOXYLATE-ATROPINE 2.5-0.025 MG PO TABS
2.0000 | ORAL_TABLET | Freq: Four times a day (QID) | ORAL | Status: DC | PRN
Start: 2015-05-04 — End: 2015-10-12

## 2015-05-04 MED ORDER — SODIUM CHLORIDE 0.9 % IV BOLUS (SEPSIS)
1000.0000 mL | Freq: Once | INTRAVENOUS | Status: AC
  Administered 2015-05-04: 1000 mL via INTRAVENOUS

## 2015-05-04 NOTE — ED Notes (Signed)
Pt unable to stool, Pollina aware, okay to dc without stool sample.

## 2015-05-04 NOTE — ED Notes (Signed)
Patient c/o lower abd pain. Per patient has c-diff x 1 month. Patient reports nausea and vomiting with increase in drahhrea. Per patient has noted bright red blood in stool with past few BMs. Patient reports being on vancomycin but insurance wouldn't pay for it so she is now taking flagyl in which she has taken for past 10 days.

## 2015-05-04 NOTE — Discharge Instructions (Signed)
Abdominal Pain °Many things can cause abdominal pain. Usually, abdominal pain is not caused by a disease and will improve without treatment. It can often be observed and treated at home. Your health care provider will do a physical exam and possibly order blood tests and X-rays to help determine the seriousness of your pain. However, in many cases, more time must pass before a clear cause of the pain can be found. Before that point, your health care provider may not know if you need more testing or further treatment. °HOME CARE INSTRUCTIONS  °Monitor your abdominal pain for any changes. The following actions may help to alleviate any discomfort you are experiencing: °· Only take over-the-counter or prescription medicines as directed by your health care provider. °· Do not take laxatives unless directed to do so by your health care provider. °· Try a clear liquid diet (broth, tea, or water) as directed by your health care provider. Slowly move to a bland diet as tolerated. °SEEK MEDICAL CARE IF: °· You have unexplained abdominal pain. °· You have abdominal pain associated with nausea or diarrhea. °· You have pain when you urinate or have a bowel movement. °· You experience abdominal pain that wakes you in the night. °· You have abdominal pain that is worsened or improved by eating food. °· You have abdominal pain that is worsened with eating fatty foods. °· You have a fever. °SEEK IMMEDIATE MEDICAL CARE IF:  °· Your pain does not go away within 2 hours. °· You keep throwing up (vomiting). °· Your pain is felt only in portions of the abdomen, such as the right side or the left lower portion of the abdomen. °· You pass bloody or black tarry stools. °MAKE SURE YOU: °· Understand these instructions.   °· Will watch your condition.   °· Will get help right away if you are not doing well or get worse.   °Document Released: 06/03/2005 Document Revised: 08/29/2013 Document Reviewed: 05/03/2013 °[employer]® Patient Information  ©2015 [employer]. This information is not intended to replace advice given to you by your health care provider. Make sure you discuss any questions you have with your health care provider. ° °Diarrhea °Diarrhea is frequent loose and watery bowel movements. It can cause you to feel weak and dehydrated. Dehydration can cause you to become tired and thirsty, have a dry mouth, and have decreased urination that often is dark yellow. Diarrhea is a sign of another problem, most often an infection that will not last long. In most cases, diarrhea typically lasts 2-3 days. However, it can last longer if it is a sign of something more serious. It is important to treat your diarrhea as directed by your caregiver to lessen or prevent future episodes of diarrhea. °CAUSES  °Some common causes include: °· Gastrointestinal infections caused by viruses, bacteria, or parasites. °· Food poisoning or food allergies. °· Certain medicines, such as antibiotics, chemotherapy, and laxatives. °· Artificial sweeteners and fructose. °· Digestive disorders. °HOME CARE INSTRUCTIONS °· Ensure adequate fluid intake (hydration): Have 1 cup (8 oz) of fluid for each diarrhea episode. Avoid fluids that contain simple sugars or sports drinks, fruit juices, whole milk products, and sodas. Your urine should be clear or pale yellow if you are drinking enough fluids. Hydrate with an oral rehydration solution that you can purchase at pharmacies, retail stores, and online. You can prepare an oral rehydration solution at home by mixing the following ingredients together: °·  - tsp table salt. °· ¾ tsp baking soda. °·    tsp salt substitute containing potassium chloride.  1  tablespoons sugar.  1 L (34 oz) of water.  Certain foods and beverages may increase the speed at which food moves through the gastrointestinal (GI) tract. These foods and beverages should be avoided and include:  Caffeinated and alcoholic beverages.  High-fiber foods, such as raw  fruits and vegetables, nuts, seeds, and whole grain breads and cereals.  Foods and beverages sweetened with sugar alcohols, such as xylitol, sorbitol, and mannitol.  Some foods may be well tolerated and may help thicken stool including:  Starchy foods, such as rice, toast, pasta, low-sugar cereal, oatmeal, grits, baked potatoes, crackers, and bagels.  Bananas.  Applesauce.  Add probiotic-rich foods to help increase healthy bacteria in the GI tract, such as yogurt and fermented milk products.  Wash your hands well after each diarrhea episode.  Only take over-the-counter or prescription medicines as directed by your caregiver.  Take a warm bath to relieve any burning or pain from frequent diarrhea episodes. SEEK IMMEDIATE MEDICAL CARE IF:   You are unable to keep fluids down.  You have persistent vomiting.  You have blood in your stool, or your stools are black and tarry.  You do not urinate in 6-8 hours, or there is only a small amount of very dark urine.  You have abdominal pain that increases or localizes.  You have weakness, dizziness, confusion, or light-headedness.  You have a severe headache.  Your diarrhea gets worse or does not get better.  You have a fever or persistent symptoms for more than 2-3 days.  You have a fever and your symptoms suddenly get worse. MAKE SURE YOU:   Understand these instructions.  Will watch your condition.  Will get help right away if you are not doing well or get worse. Document Released: 08/14/2002 Document Revised: 01/08/2014 Document Reviewed: 05/01/2012 [facility] Patient Information 2015 [place], [place]. This information is not intended to replace advice given to you by your health care provider. Make sure you discuss any questions you have with your health care provider. Clostridium Difficile Infection Clostridium difficile (C. difficile) is a bacteria found in the intestinal tract or colon. Under certain conditions, it causes  diarrhea and sometimes severe disease. The severe form of the disease is known as pseudomembranous colitis (often called C. difficile colitis). This disease can damage the lining of the colon or cause the colon to become enlarged (toxic megacolon). CAUSES Your colon normally contains many different bacteria, including C. difficile. The balance of bacteria in your colon can change during illness. This is especially true when you take antibiotic medicine. Taking antibiotics may allow the C. difficile to grow, multiply excessively, and make a toxin that then causes illness. The elderly and people with certain medical conditions have a greater risk of getting C. difficile infections. SYMPTOMS  Watery diarrhea.  Fever.  Fatigue.  Loss of appetite.  Nausea.  Abdominal swelling, pain, or tenderness.  Dehydration. DIAGNOSIS Your symptoms may make your caregiver suspect a C. difficile infection, especially if you have used antibiotics in the preceding weeks. However, there are only 2 ways to know for certain whether you have a C. difficile infection:  A lab test that finds the toxin in your stool.  The specific appearance of an abnormality (pseudomembrane) in your colon. This can only be seen by doing a sigmoidoscopy or colonoscopy. These procedures involve passing an instrument through your rectum to look at the inside of your colon. Your caregiver will help determine if these  tests are necessary. TREATMENT  Most people are successfully treated with one of two specific antibiotics, usually given by mouth. Other antibiotics you are receiving are stopped if possible.  Intravenous (IV) fluids and correction of electrolyte imbalance may be necessary.  Rarely, surgery may be needed to remove the infected part of the intestines.  Careful hand washing by you and your caregivers is important to prevent the spread of infection. In the hospital, your caregivers may also put on gowns and gloves to  prevent the spread of the C. difficile bacteria. Your room is also cleaned regularly with a solution containing bleach or a product that is known to kill C. difficile. HOME CARE INSTRUCTIONS  Drink enough fluids to keep your urine clear or pale yellow. Avoid milk, caffeine, and alcohol.  Ask your caregiver for specific rehydration instructions.  Try eating small, frequent meals rather than large meals.  Take your antibiotics as directed. Finish them even if you start to feel better.  Do not use medicines to slow diarrhea. This could delay healing or cause complications.  Wash your hands thoroughly after using the bathroom and before preparing food.  Make sure people who live with you wash their hands often, too.  Carefully disinfect all surfaces with a product that contains chlorine bleach. SEEK MEDICAL CARE IF:  Diarrhea persists longer than expected or recurs after completing your course of antibiotic treatment for the C. difficile infection.  You have trouble staying hydrated. SEEK IMMEDIATE MEDICAL CARE IF:  You develop a new fever.  You have increasing abdominal pain or tenderness.  There is blood in your stools, or your stools are dark black and tarry.  You cannot hold down food or liquids. MAKE SURE YOU:  Understand these instructions.  Will watch your condition.  Will get help right away if you are not doing well or get worse. Document Released: 06/03/2005 Document Revised: 01/08/2014 Document Reviewed: 01/30/2011 [facility] Patient Information 2015 [place], [place]. This information is not intended to replace advice given to you by your health care provider. Make sure you discuss any questions you have with your health care provider.

## 2015-05-04 NOTE — ED Notes (Signed)
Called lab to inquire about Lipase, states they will run it now

## 2015-05-04 NOTE — ED Provider Notes (Addendum)
CSN: [id number]     Arrival date & time 05/04/15  1100 History  This chart was scribed for Gilda Crease, MD by Elon Spanner, ED Scribe. This patient was seen in room APA09/APA09 and the patient's care was started at 11:56 AM.   Chief Complaint  Patient presents with  . Abdominal Pain   The history is provided by the patient. No language interpreter was used.   HPI Comments: Regina Patton is a 44 y.o. female with a recent admission for c. diff (8/6-8/15) discharged home with po vancomycin changed to flagyl for financial reasons.  She presents to the Emergency Department complaining of continued lower abdominal that worsened the past two days.  Associated symptoms include vomiting, diarrhea.  Patient was seen by her PCP last week and is scheduling following up with GI.    Past Medical History  Diagnosis Date  . Hypertension   . Chronic back pain   . Sciatic pain     right  . Rib pain on right side     chronic  . Chronic neck pain   . Cervical radiculopathy   . Chronic knee pain   . Rheumatoid arthritis     "Rhematoid factor positive but no symptoms" per EPIC notes 2012  . Fibromyalgia   . Diabetes mellitus without complication   . C. difficile diarrhea    Past Surgical History  Procedure Laterality Date  . Tubal ligation    . Bladder surgery     Family History  Problem Relation Age of Onset  . Cancer Mother   . Diabetes Mother    Social History  Substance Use Topics  . Smoking status: Never Smoker   . Smokeless tobacco: Never Used  . Alcohol Use: No   OB History    Gravida Para Term Preterm AB TAB SAB Ectopic Multiple Living   3 3 3       3      Review of Systems  Gastrointestinal: Positive for abdominal pain.  All other systems reviewed and are negative.     Allergies  Mango flavor; Penicillins; Tramadol; Bactrim; Keflex; and Toradol  Home Medications   Prior to Admission medications   Medication Sig Start Date End Date Taking? Authorizing  Provider  diphenhydrAMINE (BENADRYL) 25 MG tablet Take 1 tablet (25 mg total) by mouth every 4 (four) hours as needed for itching. Patient not taking: Reported on 04/25/2015 04/11/15   Tammy Triplett, PA-C  lisinopril-hydrochlorothiazide (PRINZIDE,ZESTORETIC) 20-25 MG per tablet Take 2 tablets by mouth every morning.     Historical Provider, MD  metroNIDAZOLE (FLAGYL) 500 MG tablet Take 1 tablet (500 mg total) by mouth 3 (three) times daily. 04/25/15 05/09/15  07/09/15, MD  ondansetron (ZOFRAN) 4 MG tablet Take 1 tablet (4 mg total) by mouth every 6 (six) hours as needed for nausea or vomiting. 04/22/15   04/24/15, MD  oxyCODONE-acetaminophen (PERCOCET/ROXICET) 5-325 MG per tablet Take 1-2 tablets by mouth every 8 (eight) hours as needed for severe pain. 04/25/15   04/27/15, MD  vancomycin (VANCOCIN) 50 mg/mL oral solution Take 10 mLs (500 mg total) by mouth every 6 (six) hours. 04/22/15   04/24/15, MD  venlafaxine XR (EFFEXOR-XR) 75 MG 24 hr capsule Take 225 mg by mouth at bedtime.     Historical Provider, MD   BP 139/93 mmHg  Pulse 87  Temp(Src) 98.5 F (36.9 C) (Oral)  Resp 16  Ht 5' (1.524 m)  Wt 198 lb (89.812 kg)  BMI 38.67 kg/m2  SpO2 99%  LMP 04/15/2015 Physical Exam  Constitutional: She is oriented to person, place, and time. She appears well-developed and well-nourished. No distress.  HENT:  Head: Normocephalic and atraumatic.  Right Ear: Hearing normal.  Left Ear: Hearing normal.  Nose: Nose normal.  Mouth/Throat: Oropharynx is clear and moist and mucous membranes are normal.  Eyes: Conjunctivae and EOM are normal. Pupils are equal, round, and reactive to light.  Neck: Normal range of motion. Neck supple.  Cardiovascular: Regular rhythm, S1 normal and S2 normal.  Exam reveals no gallop and no friction rub.   No murmur heard. Pulmonary/Chest: Effort normal and breath sounds normal. No respiratory distress. She exhibits no tenderness.  Abdominal: Soft. Normal  appearance and bowel sounds are normal. There is no hepatosplenomegaly. There is no rebound, no guarding, no tenderness at McBurney's point and negative Murphy's sign. No hernia.  Diffusely tender abdomen  Musculoskeletal: Normal range of motion.  Neurological: She is alert and oriented to person, place, and time. She has normal strength. No cranial nerve deficit or sensory deficit. Coordination normal. GCS eye subscore is 4. GCS verbal subscore is 5. GCS motor subscore is 6.  Skin: Skin is warm, dry and intact. No rash noted. No cyanosis.  Psychiatric: She has a normal mood and affect. Her speech is normal and behavior is normal. Thought content normal.  Nursing note and vitals reviewed.   ED Course  Procedures (including critical care time)  DIAGNOSTIC STUDIES: Oxygen Saturation is 99% on RA, normal by my interpretation.    COORDINATION OF CARE:  12:02 PM Discussed treatment plan with patient at bedside.  Patient acknowledges and agrees with plan.     Labs Review Labs Reviewed  CBC WITH DIFFERENTIAL/PLATELET  COMPREHENSIVE METABOLIC PANEL    Imaging Review No results found. I have personally reviewed and evaluated these images and lab results as part of my medical decision-making.   EKG Interpretation None      MDM   Final diagnoses:  None   abdominal pain  Diarrhea  Patient presents to the ER for evaluation of continued abdominal pain with nausea and diarrhea. Patient was recently diagnosed with C. difficile colitis. She has been seen in the ER several times with similar complaints. Recent CAT scan showed resolution of the colitis. She has not had repeat C. difficile testing. She is currently taking the Flagyl without any missed doses. Patient has a benign exam. Lab work is unremarkable. It's unclear what is causing her continued symptomatic diarrhea and pain, as it appears that C. difficile is responding to Flagyl. Will try to obtain stool for repeat testing, follow-up  with primary doctor as an outpatient. Patient will be treated with Phenergan for nausea, Lomotil for diarrhea, percocet #10 for pain. She is being scheduled for follow-up with gastroenterology. Primary doctor.  I personally performed the services described in this documentation, which was scribed in my presence. The recorded information has been reviewed and is accurate.     Gilda Crease, MD 05/04/15 1535  Gilda Crease, MD 05/04/15 1537

## 2015-05-09 ENCOUNTER — Ambulatory Visit (INDEPENDENT_AMBULATORY_CARE_PROVIDER_SITE_OTHER): Payer: Medicare Other | Admitting: Gastroenterology

## 2015-05-09 ENCOUNTER — Encounter: Payer: Self-pay | Admitting: Gastroenterology

## 2015-05-09 ENCOUNTER — Other Ambulatory Visit: Payer: Self-pay

## 2015-05-09 VITALS — BP 123/89 | HR 91 | Temp 98.2°F | Ht 60.0 in | Wt 211.6 lb

## 2015-05-09 DIAGNOSIS — A0472 Enterocolitis due to Clostridium difficile, not specified as recurrent: Secondary | ICD-10-CM

## 2015-05-09 DIAGNOSIS — A047 Enterocolitis due to Clostridium difficile: Secondary | ICD-10-CM

## 2015-05-09 DIAGNOSIS — R197 Diarrhea, unspecified: Secondary | ICD-10-CM

## 2015-05-09 MED ORDER — ONDANSETRON HCL 4 MG PO TABS: 4.0000 mg | ORAL_TABLET | Freq: Four times a day (QID) | ORAL | Status: DC | PRN

## 2015-05-09 MED ORDER — HYDROCODONE-ACETAMINOPHEN 5-325 MG PO TABS: ORAL_TABLET | ORAL | Status: DC

## 2015-05-09 NOTE — Assessment & Plan Note (Addendum)
SYMPTOMS NOT RESOLVED. CONTINUES WITH FEVER AND DIARRHEA.   DRINK WATER TO KEEP YOUR URINE LIGHT YELLOW. FOLLOW A LACTOSE FREE DIET.  HANDOUT GIVEN. COMPLETE stool study. USE PHENERGAN AND ZOFRAN AS NEEDED. VICODIN AS NEEDED FOR PAIN. FOLLOW UP IN 2 MOS.

## 2015-05-09 NOTE — Patient Instructions (Addendum)
DRINK WATER TO KEEP YOUR URINE LIGHT YELLOW.  FOLLOW A LACTOSE FREE DIET. SEE INFO BELOW.   COMPLETE stool study.  USE PHENERGAN AND ZOFRAN AS NEEDED.  VICODIN AS NEEDED FOR PAIN.  FOLLOW UP IN 2 MOS.    Lactose Free Diet Lactose is a carbohydrate that is found mainly in milk and milk products, as well as in foods with added milk or whey. Lactose must be digested by the enzyme in order to be used by the body. Lactose intolerance occurs when there is a shortage of lactase. When your body is not able to digest lactose, you may feel sick to your stomach (nausea), bloating, cramping, gas and diarrhea.  There are many dairy products that may be tolerated better than milk by some people:  The use of cultured dairy products such as yogurt, buttermilk, cottage cheese, and sweet acidophilus milk (Kefir) for lactase-deficient individuals is usually well tolerated. This is because the healthy bacteria help digest lactose.   Lactose-hydrolyzed milk (Lactaid) contains 40-90% less lactose than milk and may also be well tolerated.    SPECIAL NOTES  Lactose is a carbohydrates. The major food source is dairy products. Reading food labels is important. Many products contain lactose even when they are not made from milk. Look for the following words: whey, milk solids, dry milk solids, nonfat dry milk powder. Typical sources of lactose other than dairy products include breads, candies, cold cuts, prepared and processed foods, and commercial sauces and gravies.   All foods must be prepared without milk, cream, or other dairy foods.   Soy milk and lactose-free supplements (LACTASE) may be used as an alternative to milk.   FOOD GROUP ALLOWED/RECOMMENDED AVOID/USE SPARINGLY  BREADS / STARCHES 4 servings or more* Breads and rolls made without milk. [place], [place], or [place] bread. Breads and rolls that contain milk. Prepared mixes such as muffins, biscuits, waffles, pancakes. Sweet rolls, donuts,  [place] toast (if made with milk or lactose).  Crackers: Soda crackers, graham crackers. Any crackers prepared without lactose. Zwieback crackers, corn curls, or any that contain lactose.  Cereals: Cooked or dry cereals prepared without lactose (read labels). Cooked or dry cereals prepared with lactose (read labels). Total, Cocoa Krispies. Special K.  Potatoes / Pasta / Rice: Any prepared without milk or lactose. Popcorn. Instant potatoes, frozen [place] fries, scalloped or au gratin potatoes.  VEGETABLES 2 servings or more Fresh, frozen, and canned vegetables. Creamed or breaded vegetables. Vegetables in a cheese sauce or with lactose-containing margarines.  FRUIT 2 servings or more All fresh, canned, or frozen fruits that are not processed with lactose. Any canned or frozen fruits processed with lactose.  MEAT & SUBSTITUTES 2 servings or more (4 to 6 oz. total per day) Plain beef, chicken, fish, [place], lamb, veal, pork, or ham. Kosher prepared meat products. Strained or junior meats that do not contain milk. Eggs, soy meat substitutes, nuts. Scrambled eggs, omelets, and souffles that contain milk. Creamed or breaded meat, fish, or fowl. Sausage products such as wieners, liver sausage, or cold cuts that contain milk solids. Cheese, cottage cheese, or cheese spreads.  MILK None. (See "BEVERAGES" for milk substitutes. See "DESSERTS" for ice cream and frozen desserts.) Milk (whole, 2%, skim, or chocolate). Evaporated, powdered, or condensed milk; malted milk.  SOUPS & COMBINATION FOODS Bouillon, broth, vegetable soups, clear soups, consomms. Homemade soups made with allowed ingredients. Combination or prepared foods that do not contain milk or milk products (read labels). Cream soups, chowders, commercially prepared soups  containing lactose. Macaroni and cheese, pizza. Combination or prepared foods that contain milk or milk products.  DESSERTS & SWEETS In moderation Water and fruit ices; gelatin;  angel food cake. Homemade cookies, pies, or cakes made from allowed ingredients. Pudding (if made with water or a milk substitute). Lactose-free tofu desserts. Sugar, honey, corn syrup, jam, jelly; marmalade; molasses (beet sugar); Pure sugar candy; marshmallows. Ice cream, ice milk, sherbet, custard, pudding, frozen yogurt. Commercial cake and cookie mixes. Desserts that contain chocolate. Pie crust made with milk-containing margarine; reduced-calorie desserts made with a sugar substitute that contains lactose. Toffee, peppermint, butterscotch, chocolate, caramels.  FATS & OILS In moderation Butter (as tolerated; contains very small amounts of lactose). Margarines and dressings that do not contain milk, Vegetable oils, shortening, Miracle Whip, mayonnaise, nondairy cream & whipped toppings without lactose or milk solids added (examples: Coffee Rich, Carnation Coffeemate, Rich's Whipped Topping, PolyRich). Tomasa Blase. Margarines and salad dressings containing milk; cream, cream cheese; peanut butter with added milk solids, sour cream, chip dips, made with sour cream.  BEVERAGES Carbonated drinks; tea; coffee and freeze-dried coffee; some instant coffees (check labels). Fruit drinks; fruit and vegetable juice; Rice or Soy milk. Ovaltine, hot chocolate. Some cocoas; some instant coffees; instant iced teas; powdered fruit drinks (read labels).   CONDIMENTS / MISCELLANEOUS Soy sauce, carob powder, olives, gravy made with water, baker's cocoa, pickles, pure seasonings and spices, wine, pure monosodium glutamate, catsup, mustard. Some chewing gums, chocolate, some cocoas. Certain antibiotics and vitamin / mineral preparations. Spice blends if they contain milk products. MSG extender. Artificial sweeteners that contain lactose such as Equal (Nutra-Sweet) and Sweet 'n Low. Some nondairy creamers (read labels).   SAMPLE MENU*  Breakfast   Orange Juice.  Banana.   Bran flakes.   Nondairy Creamer.  [place]  Bread (toasted).   Butter or milk-free margarine.   Coffee or tea.    Noon Meal   Chicken Breast.  Rice.   Green beans.   Butter or milk-free margarine.  Fresh melon.   Coffee or tea.    Evening Meal   Roast Beef.  Baked potato.   Butter or milk-free margarine.   Broccoli.   Lettuce salad with vinegar and oil dressing.  [employer].   Coffee or tea.

## 2015-05-09 NOTE — Progress Notes (Signed)
CC'D TO PCP °

## 2015-05-09 NOTE — Progress Notes (Signed)
ON RECALL  °

## 2015-05-09 NOTE — Progress Notes (Signed)
Subjective:    Patient ID: Regina Patton, female    DOB: 1971-02-05, 44 y.o.   MRN: [id number]  Kaleen Mask, MD  HPI WAS ON ABX FOR UTI FOR 1.5 MOS AND WAS ON ABX(BACTRIM, KEFLEX, MACROBID, CIPRO). BMs: 8-9 WATERY STOOLS A DAY. OCCASIONAL BLOOD IN STOOL. HAD C DIFF PRECAUTIONS DISCUSSED IN HOSPITAL. D/C FROM HOSPITAL 2 WEEKS AGO. WAS SUPPOSE TO BEON VANC BUT COULDN'T AFFORD IT SO SHE WA DISCHARGED ON VANC AND ONLY HAD 2 DOSES. SHE WEN TO ED AND SHE WAS PLACED ON FLAGYL FOR 14 DAYS-NO DIFFERENCE IN STOOLS. T 101-102F LAST NIGHT. NAUSEA: OFF AND ON. VOMITING: 2-3 A DAY(NO BLOOD). ABDOMINAL PAIN: LOWER, ALL THE TIME, CRAMPY & SHARP, KEEPS HER AWAKE, PAIN GETS WORSE AND THE THE SAME.  LAST ABD PAIN FREE TIME: 1 MO AGO. HEARTBURN: 1-2X/WEEK. WEIGHT LOSS: 15 LBS.  PT DENIES melena, diarrhea, CHEST PAIN, SHORTNESS OF BREATH,  constipation, problems swallowing, OR heartburn or indigestion.  Past Medical History  Diagnosis Date  . Hypertension   . Chronic back pain   . Sciatic pain     right  . Rib pain on right side     chronic  . Chronic neck pain   . Cervical radiculopathy   . Chronic knee pain   . Rheumatoid arthritis     "Rhematoid factor positive but no symptoms" per EPIC notes 2012  . Fibromyalgia   . Diabetes mellitus without complication   . C. difficile diarrhea    Past Surgical History  Procedure Laterality Date  . Tubal ligation    . Bladder surgery     Allergies  Allergen Reactions  . Mango Flavor Anaphylaxis and Swelling    Lips swelling  . Penicillins Anaphylaxis and Hives  . Tramadol Nausea And Vomiting  . Bactrim [Sulfamethoxazole-Trimethoprim] Itching and Rash  . Keflex [Cephalexin] Hives, Itching and Rash  . Toradol [Ketorolac Tromethamine] Rash   Current Outpatient Prescriptions  Medication Sig Dispense Refill  . lisinopril-hydrochlorothiazide (PRINZIDE,ZESTORETIC) 20-25 MG per tablet Take 2 tablets by mouth every morning.     . ondansetron  (ZOFRAN) 4 MG tablet Take 1 tablet (4 mg total) by mouth every 6 (six) hours as needed for nausea or vomiting.    . promethazine (PHENERGAN) 25 MG tablet Take 1 tablet (25 mg total) by mouth every 6 (six) hours as needed for nausea or vomiting.    . venlafaxine XR (EFFEXOR-XR) 75 MG 24 hr capsule Take 225 mg by mouth at bedtime.     .      .      .      .       Review of Systems PER HPI OTHERWISE ALL SYSTEMS ARE NEGATIVE.     Objective:   Physical Exam  Constitutional: She is oriented to person, place, and time. She appears well-developed and well-nourished. No distress.  HENT:  Head: Normocephalic and atraumatic.  Mouth/Throat: Oropharynx is clear and moist. No oropharyngeal exudate.  Eyes: Pupils are equal, round, and reactive to light. No scleral icterus.  Neck: Normal range of motion. Neck supple.  Cardiovascular: Normal rate, regular rhythm and normal heart sounds.   Pulmonary/Chest: Effort normal and breath sounds normal. No respiratory distress.  Abdominal: Soft. Bowel sounds are normal. She exhibits no distension. There is tenderness. There is no rebound and no guarding.  MILD BLQs   Musculoskeletal: She exhibits no edema.  Lymphadenopathy:    She has no cervical adenopathy.  Neurological: She is alert  and oriented to person, place, and time.  NO FOCAL DEFICITS   Psychiatric:  FLAT AFFECT, SLIGHTLY ANXIOUS MOOD    Vitals reviewed.         Assessment & Plan:

## 2015-05-10 NOTE — Progress Notes (Signed)
AWAITING C DIFF RESULTS. PT WILL NEED PRIOR AUTHORIZATIONS FOR VANCOMYCIN.

## 2015-05-14 ENCOUNTER — Encounter (HOSPITAL_COMMUNITY): Payer: Self-pay | Admitting: Emergency Medicine

## 2015-05-14 ENCOUNTER — Telehealth: Payer: Self-pay

## 2015-05-14 ENCOUNTER — Emergency Department (HOSPITAL_COMMUNITY)
Admission: EM | Admit: 2015-05-14 | Discharge: 2015-05-14 | Disposition: A | Discharge status: Home / Self Care | Payer: Medicare Other | Attending: Emergency Medicine | Admitting: Emergency Medicine

## 2015-05-14 DIAGNOSIS — Z8619 Personal history of other infectious and parasitic diseases: Secondary | ICD-10-CM | POA: Insufficient documentation

## 2015-05-14 DIAGNOSIS — M797 Fibromyalgia: Secondary | ICD-10-CM | POA: Insufficient documentation

## 2015-05-14 DIAGNOSIS — M069 Rheumatoid arthritis, unspecified: Secondary | ICD-10-CM | POA: Insufficient documentation

## 2015-05-14 DIAGNOSIS — Z79899 Other long term (current) drug therapy: Secondary | ICD-10-CM | POA: Insufficient documentation

## 2015-05-14 DIAGNOSIS — E119 Type 2 diabetes mellitus without complications: Secondary | ICD-10-CM | POA: Diagnosis not present

## 2015-05-14 DIAGNOSIS — Z880 Allergy status to penicillin: Secondary | ICD-10-CM | POA: Diagnosis not present

## 2015-05-14 DIAGNOSIS — R509 Fever, unspecified: Secondary | ICD-10-CM | POA: Diagnosis not present

## 2015-05-14 DIAGNOSIS — R197 Diarrhea, unspecified: Secondary | ICD-10-CM | POA: Diagnosis present

## 2015-05-14 DIAGNOSIS — Z8739 Personal history of other diseases of the musculoskeletal system and connective tissue: Secondary | ICD-10-CM | POA: Diagnosis not present

## 2015-05-14 DIAGNOSIS — G8929 Other chronic pain: Secondary | ICD-10-CM | POA: Diagnosis not present

## 2015-05-14 DIAGNOSIS — R109 Unspecified abdominal pain: Secondary | ICD-10-CM | POA: Diagnosis not present

## 2015-05-14 DIAGNOSIS — I10 Essential (primary) hypertension: Secondary | ICD-10-CM | POA: Insufficient documentation

## 2015-05-14 LAB — COMPREHENSIVE METABOLIC PANEL
ALBUMIN: 4 g/dL (ref 3.5–5.0)
ALK PHOS: 38 U/L (ref 38–126)
ALT: 17 U/L (ref 14–54)
ANION GAP: 7 (ref 5–15)
AST: 20 U/L (ref 15–41)
BILIRUBIN TOTAL: 0.5 mg/dL (ref 0.3–1.2)
BUN: 6 mg/dL (ref 6–20)
CALCIUM: 8.8 mg/dL — AB (ref 8.9–10.3)
CO2: 24 mmol/L (ref 22–32)
Chloride: 105 mmol/L (ref 101–111)
Creatinine, Ser: 0.73 mg/dL (ref 0.44–1.00)
GLUCOSE: 99 mg/dL (ref 65–99)
POTASSIUM: 3.7 mmol/L (ref 3.5–5.1)
Sodium: 136 mmol/L (ref 135–145)
TOTAL PROTEIN: 7.7 g/dL (ref 6.5–8.1)

## 2015-05-14 LAB — CBC WITH DIFFERENTIAL/PLATELET
BASOS PCT: 0 % (ref 0–1)
Basophils Absolute: 0 10*3/uL (ref 0.0–0.1)
Eosinophils Absolute: 0.1 10*3/uL (ref 0.0–0.7)
Eosinophils Relative: 2 % (ref 0–5)
HEMATOCRIT: 38.7 % (ref 36.0–46.0)
HEMOGLOBIN: 13.3 g/dL (ref 12.0–15.0)
LYMPHS ABS: 2.7 10*3/uL (ref 0.7–4.0)
Lymphocytes Relative: 36 % (ref 12–46)
MCH: 30.2 pg (ref 26.0–34.0)
MCHC: 34.4 g/dL (ref 30.0–36.0)
MCV: 88 fL (ref 78.0–100.0)
MONO ABS: 0.3 10*3/uL (ref 0.1–1.0)
MONOS PCT: 4 % (ref 3–12)
NEUTROS ABS: 4.5 10*3/uL (ref 1.7–7.7)
NEUTROS PCT: 58 % (ref 43–77)
Platelets: 287 10*3/uL (ref 150–400)
RBC: 4.4 MIL/uL (ref 3.87–5.11)
RDW: 13.3 % (ref 11.5–15.5)
WBC: 7.7 10*3/uL (ref 4.0–10.5)

## 2015-05-14 LAB — LIPASE, BLOOD: LIPASE: 32 U/L (ref 22–51)

## 2015-05-14 MED ORDER — ONDANSETRON HCL 4 MG/2ML IJ SOLN
4.0000 mg | Freq: Once | INTRAMUSCULAR | Status: AC
  Administered 2015-05-14: 4 mg via INTRAVENOUS
  Filled 2015-05-14: qty 2

## 2015-05-14 MED ORDER — SODIUM CHLORIDE 0.9 % IV BOLUS (SEPSIS)
1000.0000 mL | Freq: Once | INTRAVENOUS | Status: AC
  Administered 2015-05-14: 1000 mL via INTRAVENOUS

## 2015-05-14 MED ORDER — MORPHINE SULFATE (PF) 4 MG/ML IV SOLN
4.0000 mg | Freq: Once | INTRAVENOUS | Status: AC
  Administered 2015-05-14: 4 mg via INTRAVENOUS
  Filled 2015-05-14: qty 1

## 2015-05-14 NOTE — Telephone Encounter (Signed)
Agree. With that level of pain and persistent bloody stools, needs ED evaluation .

## 2015-05-14 NOTE — ED Notes (Signed)
Pt dx with C-diff last month, was admitted to [facility], pt states bloody diarrhea has gotten worse. Abdominal  pain

## 2015-05-14 NOTE — Telephone Encounter (Signed)
Forwarding to Gerrit Halls, NP in Dr. Evelina Dun absence for [facility].

## 2015-05-14 NOTE — ED Provider Notes (Signed)
CSN: [id number]     Arrival date & time 05/14/15  1436 History   First MD Initiated Contact with Patient 05/14/15 1507     Chief Complaint  Patient presents with  . Diarrhea    C- DIFF     (Consider location/radiation/quality/duration/timing/severity/associated sxs/prior Treatment) HPI Comments: Patient is a 44 year old female with history of chronic low back pain, hypertension, and recent hospitalizations for C. difficile colitis. She has been off and on multiple antibiotics medications for this. She was discharged from the hospital with oral vancomycin, however stopped taking this due to the cost of the medication. She was seen by her gastroenterologist last week and had a stool culture ordered. This was obtained this morning. The patient called the office stating that she is having continued pain and was advised to come here for evaluation. She reports bloody stools and abdominal cramping.  Patient is a 44 y.o. female presenting with diarrhea. The history is provided by the patient.  Diarrhea Quality:  Bloody Severity:  Moderate Onset quality:  Gradual Duration:  2 months Timing:  Intermittent Relieved by:  Nothing Worsened by:  Nothing tried Ineffective treatments:  None tried Associated symptoms: abdominal pain and fever   Associated symptoms: no chills     Past Medical History  Diagnosis Date  . Hypertension   . Chronic back pain   . Sciatic pain     right  . Rib pain on right side     chronic  . Chronic neck pain   . Cervical radiculopathy   . Chronic knee pain   . Rheumatoid arthritis     "Rhematoid factor positive but no symptoms" per EPIC notes 2012  . Fibromyalgia   . Diabetes mellitus without complication   . C. difficile diarrhea    Past Surgical History  Procedure Laterality Date  . Tubal ligation    . Bladder surgery     Family History  Problem Relation Age of Onset  . Cancer Mother   . Diabetes Mother   . Colon cancer Father     AFTER AGE 16  .  Crohn's disease Sister    Social History  Substance Use Topics  . Smoking status: Never Smoker   . Smokeless tobacco: Never Used  . Alcohol Use: No   OB History    Gravida Para Term Preterm AB TAB SAB Ectopic Multiple Living   3 3 3       3      Review of Systems  Constitutional: Positive for fever. Negative for chills.  Gastrointestinal: Positive for abdominal pain and diarrhea.  All other systems reviewed and are negative.     Allergies  Mango flavor; Penicillins; Tramadol; Bactrim; Keflex; and Toradol  Home Medications   Prior to Admission medications   Medication Sig Start Date End Date Taking? Authorizing Provider  diphenoxylate-atropine (LOMOTIL) 2.5-0.025 MG per tablet Take 2 tablets by mouth 4 (four) times daily as needed for diarrhea or loose stools. Patient not taking: Reported on 05/09/2015 05/04/15   05/06/15, MD  HYDROcodone-acetaminophen (NORCO/VICODIN) 5-325 MG per tablet 1 OR 2 EVERY 4-6 H PRN FOR PAIN 05/09/15   07/09/15, MD  lisinopril-hydrochlorothiazide (PRINZIDE,ZESTORETIC) 20-25 MG per tablet Take 2 tablets by mouth every morning.     Historical Provider, MD  ondansetron (ZOFRAN) 4 MG tablet Take 1 tablet (4 mg total) by mouth every 6 (six) hours as needed for nausea or vomiting. 05/09/15   07/09/15, MD  oxyCODONE-acetaminophen (PERCOCET) 5-325 MG  per tablet Take 1-2 tablets by mouth every 4 (four) hours as needed. Patient not taking: Reported on 05/09/2015 05/04/15   Gilda Crease, MD  promethazine (PHENERGAN) 25 MG tablet Take 1 tablet (25 mg total) by mouth every 6 (six) hours as needed for nausea or vomiting. 05/04/15   Gilda Crease, MD  venlafaxine XR (EFFEXOR-XR) 75 MG 24 hr capsule Take 225 mg by mouth at bedtime.     Historical Provider, MD   BP 139/102 mmHg  Pulse 100  Temp(Src) 98.3 F (36.8 C) (Oral)  Resp 20  Ht 5' (1.524 m)  Wt 198 lb (89.812 kg)  BMI 38.67 kg/m2  SpO2 97%  LMP 05/13/2015 Physical Exam   Constitutional: She is oriented to person, place, and time. She appears well-developed and well-nourished. No distress.  HENT:  Head: Normocephalic and atraumatic.  Neck: Normal range of motion. Neck supple.  Cardiovascular: Normal rate and regular rhythm.  Exam reveals no gallop and no friction rub.   No murmur heard. Pulmonary/Chest: Effort normal and breath sounds normal. No respiratory distress. She has no wheezes.  Abdominal: Soft. Bowel sounds are normal. She exhibits no distension and no mass. There is tenderness. There is no rebound and no guarding.  There is tenderness to palpation in the left lower quadrant, right lower quadrant, and suprapubic region.  Musculoskeletal: Normal range of motion.  Neurological: She is alert and oriented to person, place, and time.  Skin: Skin is warm and dry. She is not diaphoretic.  Nursing note and vitals reviewed.   ED Course  Procedures (including critical care time) Labs Review Labs Reviewed  CBC WITH DIFFERENTIAL/PLATELET  COMPREHENSIVE METABOLIC PANEL  LIPASE, BLOOD    Imaging Review No results found. I have personally reviewed and evaluated these images and lab results as part of my medical decision-making.   EKG Interpretation None      MDM   Final diagnoses:  None    Patient presents here with ongoing abdominal pain and reported bloody loose stool. She was recently hospitalized for C. difficile. She tells me she is awaiting authorization for for oral vancomycin from her gastroenterologist. She appears well today. She is nontoxic and appears in no discomfort. Her laboratory studies are reassuring, she is afebrile, and vitals are normal. I see no indication for further imaging and do not feel as though any further workup is indicated at this time. I attempted to reach her gastroenterologist, Dr. Darrick Penna, however was unsuccessful. I will advise her to call Dr. Darrick Penna office in the morning to arrange a follow-up appointment as  soon as possible to expedite the relation of her medications.    Geoffery Lyons, MD 05/14/15 [phone]

## 2015-05-14 NOTE — Telephone Encounter (Signed)
Pt called and states that she has been having bloody diarrhea all night. She states her pain is a 10 out of 10. States she is hurting in her stomach. Patient also states that she did completer her stool sample this morning.   I advised pt that Dr. Darrick Penna is not in the office today and that if her pain is that bad she needs to go to the ER for evaluation.

## 2015-05-14 NOTE — Discharge Instructions (Signed)
Follow-up with Dr. Darrick Penna tomorrow to expedite authorization of your medication.   Abdominal Pain Many things can cause abdominal pain. Usually, abdominal pain is not caused by a disease and will improve without treatment. It can often be observed and treated at home. Your health care provider will do a physical exam and possibly order blood tests and X-rays to help determine the seriousness of your pain. However, in many cases, more time must pass before a clear cause of the pain can be found. Before that point, your health care provider may not know if you need more testing or further treatment. HOME CARE INSTRUCTIONS  Monitor your abdominal pain for any changes. The following actions may help to alleviate any discomfort you are experiencing:  Only take over-the-counter or prescription medicines as directed by your health care provider.  Do not take laxatives unless directed to do so by your health care provider.  Try a clear liquid diet (broth, tea, or water) as directed by your health care provider. Slowly move to a bland diet as tolerated. SEEK MEDICAL CARE IF:  You have unexplained abdominal pain.  You have abdominal pain associated with nausea or diarrhea.  You have pain when you urinate or have a bowel movement.  You experience abdominal pain that wakes you in the night.  You have abdominal pain that is worsened or improved by eating food.  You have abdominal pain that is worsened with eating fatty foods.  You have a fever. SEEK IMMEDIATE MEDICAL CARE IF:   Your pain does not go away within 2 hours.  You keep throwing up (vomiting).  Your pain is felt only in portions of the abdomen, such as the right side or the left lower portion of the abdomen.  You pass bloody or black tarry stools. MAKE SURE YOU:  Understand these instructions.   Will watch your condition.   Will get help right away if you are not doing well or get worse.  Document Released: 06/03/2005  Document Revised: 08/29/2013 Document Reviewed: 05/03/2013 [facility] Patient Information 2015 [place], [place]. This information is not intended to replace advice given to you by your health care provider. Make sure you discuss any questions you have with your health care provider.

## 2015-05-15 ENCOUNTER — Encounter (HOSPITAL_COMMUNITY): Payer: Self-pay | Admitting: *Deleted

## 2015-05-15 ENCOUNTER — Telehealth: Payer: Self-pay

## 2015-05-15 ENCOUNTER — Encounter: Payer: Self-pay | Admitting: Gastroenterology

## 2015-05-15 ENCOUNTER — Emergency Department (HOSPITAL_COMMUNITY)
Admission: EM | Admit: 2015-05-15 | Discharge: 2015-05-15 | Disposition: A | Discharge status: Home / Self Care | Payer: Medicare Other | Attending: Emergency Medicine | Admitting: Emergency Medicine

## 2015-05-15 ENCOUNTER — Emergency Department (HOSPITAL_COMMUNITY): Payer: Medicare Other

## 2015-05-15 DIAGNOSIS — Z8619 Personal history of other infectious and parasitic diseases: Secondary | ICD-10-CM | POA: Insufficient documentation

## 2015-05-15 DIAGNOSIS — M069 Rheumatoid arthritis, unspecified: Secondary | ICD-10-CM | POA: Diagnosis not present

## 2015-05-15 DIAGNOSIS — M797 Fibromyalgia: Secondary | ICD-10-CM | POA: Insufficient documentation

## 2015-05-15 DIAGNOSIS — I10 Essential (primary) hypertension: Secondary | ICD-10-CM | POA: Diagnosis not present

## 2015-05-15 DIAGNOSIS — R197 Diarrhea, unspecified: Secondary | ICD-10-CM | POA: Diagnosis not present

## 2015-05-15 DIAGNOSIS — Z880 Allergy status to penicillin: Secondary | ICD-10-CM | POA: Diagnosis not present

## 2015-05-15 DIAGNOSIS — Z3202 Encounter for pregnancy test, result negative: Secondary | ICD-10-CM | POA: Diagnosis not present

## 2015-05-15 DIAGNOSIS — R1084 Generalized abdominal pain: Secondary | ICD-10-CM | POA: Diagnosis present

## 2015-05-15 DIAGNOSIS — Z79899 Other long term (current) drug therapy: Secondary | ICD-10-CM | POA: Insufficient documentation

## 2015-05-15 DIAGNOSIS — G8929 Other chronic pain: Secondary | ICD-10-CM | POA: Insufficient documentation

## 2015-05-15 DIAGNOSIS — E119 Type 2 diabetes mellitus without complications: Secondary | ICD-10-CM | POA: Insufficient documentation

## 2015-05-15 DIAGNOSIS — Z9851 Tubal ligation status: Secondary | ICD-10-CM | POA: Diagnosis not present

## 2015-05-15 DIAGNOSIS — R109 Unspecified abdominal pain: Secondary | ICD-10-CM

## 2015-05-15 LAB — URINALYSIS, ROUTINE W REFLEX MICROSCOPIC
BILIRUBIN URINE: NEGATIVE
GLUCOSE, UA: NEGATIVE mg/dL
Ketones, ur: NEGATIVE mg/dL
Nitrite: NEGATIVE
PH: 6.5 (ref 5.0–8.0)
Protein, ur: NEGATIVE mg/dL
SPECIFIC GRAVITY, URINE: 1.01 (ref 1.005–1.030)
Urobilinogen, UA: 0.2 mg/dL (ref 0.0–1.0)

## 2015-05-15 LAB — CBC WITH DIFFERENTIAL/PLATELET
BASOS ABS: 0 10*3/uL (ref 0.0–0.1)
Basophils Relative: 0 % (ref 0–1)
EOS PCT: 1 % (ref 0–5)
Eosinophils Absolute: 0.1 10*3/uL (ref 0.0–0.7)
HCT: 39.7 % (ref 36.0–46.0)
Hemoglobin: 13.5 g/dL (ref 12.0–15.0)
LYMPHS ABS: 2.3 10*3/uL (ref 0.7–4.0)
LYMPHS PCT: 25 % (ref 12–46)
MCH: 30 pg (ref 26.0–34.0)
MCHC: 34 g/dL (ref 30.0–36.0)
MCV: 88.2 fL (ref 78.0–100.0)
MONO ABS: 0.3 10*3/uL (ref 0.1–1.0)
Monocytes Relative: 3 % (ref 3–12)
Neutro Abs: 6.3 10*3/uL (ref 1.7–7.7)
Neutrophils Relative %: 71 % (ref 43–77)
PLATELETS: 281 10*3/uL (ref 150–400)
RBC: 4.5 MIL/uL (ref 3.87–5.11)
RDW: 13.4 % (ref 11.5–15.5)
WBC: 9.1 10*3/uL (ref 4.0–10.5)

## 2015-05-15 LAB — LIPASE, BLOOD: Lipase: 33 U/L (ref 22–51)

## 2015-05-15 LAB — COMPREHENSIVE METABOLIC PANEL
ALBUMIN: 4.2 g/dL (ref 3.5–5.0)
ALK PHOS: 32 U/L — AB (ref 38–126)
ALT: 17 U/L (ref 14–54)
ANION GAP: 9 (ref 5–15)
AST: 22 U/L (ref 15–41)
BILIRUBIN TOTAL: 0.5 mg/dL (ref 0.3–1.2)
BUN: 6 mg/dL (ref 6–20)
CALCIUM: 9.1 mg/dL (ref 8.9–10.3)
CO2: 26 mmol/L (ref 22–32)
Chloride: 106 mmol/L (ref 101–111)
Creatinine, Ser: 0.74 mg/dL (ref 0.44–1.00)
GFR calc Af Amer: >60 mL/min (ref >60)
GLUCOSE: 101 mg/dL — AB (ref 65–99)
Potassium: 4 mmol/L (ref 3.5–5.1)
Sodium: 141 mmol/L (ref 135–145)
TOTAL PROTEIN: 8 g/dL (ref 6.5–8.1)

## 2015-05-15 LAB — C DIFFICILE QUICK SCREEN W PCR REFLEX
C Diff antigen: POSITIVE — AB
C Diff toxin: NEGATIVE

## 2015-05-15 LAB — URINE MICROSCOPIC-ADD ON

## 2015-05-15 LAB — POC OCCULT BLOOD, ED: Fecal Occult Bld: POSITIVE — AB

## 2015-05-15 LAB — CLOSTRIDIUM DIFFICILE BY PCR: Toxigenic C. Difficile by PCR: DETECTED — CR

## 2015-05-15 LAB — LACTIC ACID, PLASMA
LACTIC ACID, VENOUS: 0.9 mmol/L (ref 0.5–2.0)
Lactic Acid, Venous: 1.7 mmol/L (ref 0.5–2.0)

## 2015-05-15 LAB — PREGNANCY, URINE: Preg Test, Ur: NEGATIVE

## 2015-05-15 MED ORDER — IOHEXOL 300 MG/ML  SOLN
100.0000 mL | Freq: Once | INTRAMUSCULAR | Status: AC | PRN
  Administered 2015-05-15: 100 mL via INTRAVENOUS

## 2015-05-15 MED ORDER — ONDANSETRON HCL 4 MG/2ML IJ SOLN
4.0000 mg | INTRAMUSCULAR | Status: AC | PRN
  Administered 2015-05-15 (×2): 4 mg via INTRAVENOUS
  Filled 2015-05-15 (×2): qty 2

## 2015-05-15 MED ORDER — VANCOMYCIN HCL 250 MG PO CAPS: ORAL_CAPSULE | ORAL | Status: DC

## 2015-05-15 MED ORDER — IOHEXOL 300 MG/ML  SOLN
25.0000 mL | Freq: Once | INTRAMUSCULAR | Status: AC | PRN
  Administered 2015-05-15: 25 mL via ORAL

## 2015-05-15 MED ORDER — SODIUM CHLORIDE 0.9 % IV SOLN
INTRAVENOUS | Status: DC
  Administered 2015-05-15: 15:00:00 via INTRAVENOUS

## 2015-05-15 MED ORDER — MORPHINE SULFATE (PF) 4 MG/ML IV SOLN
4.0000 mg | INTRAVENOUS | Status: AC | PRN
  Administered 2015-05-15 (×2): 4 mg via INTRAVENOUS
  Filled 2015-05-15 (×2): qty 1

## 2015-05-15 NOTE — Telephone Encounter (Signed)
PT SEEN SEP 1 AS OP. TOLD TO SUBMIT STOOL SAMPLE. SAMPLE SUBMITTED SEP 6 IN THE ED. RESULTED SEP 7 AT 244 PM. NEED VANC FOR 4 WEEKS. SUBMITTED [facility] FOR PRIOR AUTHORIZATION May 14 1648.

## 2015-05-15 NOTE — Telephone Encounter (Signed)
DR. Clarene Duke CALLED. UNABLE TO OBTAIN VANCOYCIN. WILL GET PRIOR AUTHORIZATION.

## 2015-05-15 NOTE — ED Notes (Signed)
Pt states seen here yesterday. States bad abdominal pain, diarrhea and nausea. Pt states she was dx with Cdiff a month ago. Uanble to get in to see Dr. Darrick Penna this today and was told to come to the ED.

## 2015-05-15 NOTE — ED Notes (Signed)
Pt states she has had 5-6 BM since leaving hospital yesterday

## 2015-05-15 NOTE — Telephone Encounter (Signed)
T/C from Yolande Jolly at [place] reporting C-Diff detected in stool.

## 2015-05-15 NOTE — ED Provider Notes (Signed)
CSN: [id number]     Arrival date & time 05/15/15  1159 History   First MD Initiated Contact with Patient 05/15/15 1404     Chief Complaint  Patient presents with  . Abdominal Pain      HPI Pt was seen at 1415.  Per pt, c/o gradual onset and persistence of constant generalized abd "pain" for the past 2 months, worse over the past 4 days.  Has been associated with nausea and multiple intermittent episodes of diarrhea.  Describes the abd pain as "stabbing." Pt states she was dx with cdff 1 month ago, rx vancomycin, but has not filled the rx due to cost/insurance issues. Pt states she finished a 14 day course of flagyl last week without change in her symptoms. Pt states she called her GI Dr. Darrick Penna today and was told to come to the ED for evaluation (last ofc eval was 05/09/15). Denies vomiting, no fevers, no back pain, no rash, no CP/SOB, no black or blood in stools. The symptoms have been associated with no other complaints. The patient has a significant history of similar symptoms previously, recently being evaluated for this complaint and multiple prior evals for same, most recently yesterday.        Past Medical History  Diagnosis Date  . Hypertension   . Chronic back pain   . Sciatic pain     right  . Rib pain on right side     chronic  . Chronic neck pain   . Cervical radiculopathy   . Chronic knee pain   . Rheumatoid arthritis     "Rhematoid factor positive but no symptoms" per EPIC notes 2012  . Fibromyalgia   . Diabetes mellitus without complication   . C. difficile diarrhea    Past Surgical History  Procedure Laterality Date  . Tubal ligation    . Bladder surgery     Family History  Problem Relation Age of Onset  . Cancer Mother   . Diabetes Mother   . Colon cancer Father     AFTER AGE 31  . Crohn's disease Sister    Social History  Substance Use Topics  . Smoking status: Never Smoker   . Smokeless tobacco: Never Used  . Alcohol Use: No   OB History    Gravida  Para Term Preterm AB TAB SAB Ectopic Multiple Living   3 3 3       3      Review of Systems ROS: Statement: All systems negative except as marked or noted in the HPI; Constitutional: Negative for fever and chills. ; ; Eyes: Negative for eye pain, redness and discharge. ; ; ENMT: Negative for ear pain, hoarseness, nasal congestion, sinus pressure and sore throat. ; ; Cardiovascular: Negative for chest pain, palpitations, diaphoresis, dyspnea and peripheral edema. ; ; Respiratory: Negative for cough, wheezing and stridor. ; ; Gastrointestinal: +abd pain, diarrhea, nausea. Negative for vomiting, blood in stool, hematemesis, jaundice and rectal bleeding. . ; ; Genitourinary: Negative for dysuria, flank pain and hematuria. ; ; Musculoskeletal: Negative for back pain and neck pain. Negative for swelling and trauma.; ; Skin: Negative for pruritus, rash, abrasions, blisters, bruising and skin lesion.; ; Neuro: Negative for headache, lightheadedness and neck stiffness. Negative for weakness, altered level of consciousness , altered mental status, extremity weakness, paresthesias, involuntary movement, seizure and syncope.     Allergies  Mango flavor; Penicillins; Tramadol; Bactrim; Keflex; and Toradol  Home Medications   Prior to Admission medications   Medication  Sig Start Date End Date Taking? Authorizing Provider  lisinopril-hydrochlorothiazide (PRINZIDE,ZESTORETIC) 20-25 MG per tablet Take 2 tablets by mouth every morning.    Yes Historical Provider, MD  ondansetron (ZOFRAN) 4 MG tablet Take 1 tablet (4 mg total) by mouth every 6 (six) hours as needed for nausea or vomiting. 05/09/15  Yes [place] Bali, MD  promethazine (PHENERGAN) 25 MG tablet Take 1 tablet (25 mg total) by mouth every 6 (six) hours as needed for nausea or vomiting. 05/04/15  Yes Gilda Crease, MD  venlafaxine XR (EFFEXOR-XR) 75 MG 24 hr capsule Take 225 mg by mouth at bedtime.    Yes Historical Provider, MD   diphenoxylate-atropine (LOMOTIL) 2.5-0.025 MG per tablet Take 2 tablets by mouth 4 (four) times daily as needed for diarrhea or loose stools. Patient not taking: Reported on 05/09/2015 05/04/15   Gilda Crease, MD  HYDROcodone-acetaminophen (NORCO/VICODIN) 5-325 MG per tablet 1 OR 2 EVERY 4-6 H PRN FOR PAIN Patient not taking: Reported on 05/14/2015 05/09/15   [place] Bali, MD  oxyCODONE-acetaminophen (PERCOCET) 5-325 MG per tablet Take 1-2 tablets by mouth every 4 (four) hours as needed. Patient not taking: Reported on 05/09/2015 05/04/15   Gilda Crease, MD   BP 136/75 mmHg  Pulse 91  Temp(Src) 98.7 F (37.1 C) (Oral)  Resp 16  Ht 5' (1.524 m)  Wt 197 lb (89.359 kg)  BMI 38.47 kg/m2  SpO2 100%  LMP 05/13/2015 Physical Exam  1420: Physical examination:  Nursing notes reviewed; Vital signs and O2 SAT reviewed;  Constitutional: Well developed, Well nourished, Well hydrated, In no acute distress; Head:  Normocephalic, atraumatic; Eyes: EOMI, PERRL, No scleral icterus; ENMT: Mouth and pharynx normal, Mucous membranes moist; Neck: Supple, Full range of motion, No lymphadenopathy; Cardiovascular: Regular rate and rhythm, No gallop; Respiratory: Breath sounds clear & equal bilaterally, No wheezes.  Speaking full sentences with ease, Normal respiratory effort/excursion; Chest: Nontender, Movement normal; Abdomen: Soft, +diffuse tenderness to palp. No rebound or guarding. Nondistended, Normal bowel sounds; Genitourinary: No CVA tenderness; Extremities: Pulses normal, No tenderness, No edema, No calf edema or asymmetry.; Neuro: AA&Ox3, Major CN grossly intact.  Speech clear. No gross focal motor or sensory deficits in extremities.; Skin: Color normal, Warm, Dry.   ED Course  Procedures (including critical care time) Labs Review   Imaging Review  I have personally reviewed and evaluated these images and lab results as part of my medical decision-making.   EKG Interpretation None       MDM  MDM Reviewed: previous chart, nursing note and vitals Reviewed previous: labs Interpretation: labs and CT scan     Results for orders placed or performed during the hospital encounter of 05/15/15  Comprehensive metabolic panel  Result Value Ref Range   Sodium 141 135 - 145 mmol/L   Potassium 4.0 3.5 - 5.1 mmol/L   Chloride 106 101 - 111 mmol/L   CO2 26 22 - 32 mmol/L   Glucose, Bld 101 (H) 65 - 99 mg/dL   BUN 6 6 - 20 mg/dL   Creatinine, Ser 7.32 0.44 - 1.00 mg/dL   Calcium 9.1 8.9 - 20.2 mg/dL   Total Protein 8.0 6.5 - 8.1 g/dL   Albumin 4.2 3.5 - 5.0 g/dL   AST 22 15 - 41 U/L   ALT 17 14 - 54 U/L   Alkaline Phosphatase 32 (L) 38 - 126 U/L   Total Bilirubin 0.5 0.3 - 1.2 mg/dL   GFR calc non Af Amer >  60 >60 mL/min   GFR calc Af Amer >60 >60 mL/min   Anion gap 9 5 - 15  Lipase, blood  Result Value Ref Range   Lipase 33 22 - 51 U/L  Lactic acid, plasma  Result Value Ref Range   Lactic Acid, Venous 1.7 0.5 - 2.0 mmol/L  Lactic acid, plasma  Result Value Ref Range   Lactic Acid, Venous 0.9 0.5 - 2.0 mmol/L  CBC with Differential  Result Value Ref Range   WBC 9.1 4.0 - 10.5 K/uL   RBC 4.50 3.87 - 5.11 MIL/uL   Hemoglobin 13.5 12.0 - 15.0 g/dL   HCT 12.1 62.4 - 46.9 %   MCV 88.2 78.0 - 100.0 fL   MCH 30.0 26.0 - 34.0 pg   MCHC 34.0 30.0 - 36.0 g/dL   RDW 50.7 22.5 - 75.0 %   Platelets 281 150 - 400 K/uL   Neutrophils Relative % 71 43 - 77 %   Neutro Abs 6.3 1.7 - 7.7 K/uL   Lymphocytes Relative 25 12 - 46 %   Lymphs Abs 2.3 0.7 - 4.0 K/uL   Monocytes Relative 3 3 - 12 %   Monocytes Absolute 0.3 0.1 - 1.0 K/uL   Eosinophils Relative 1 0 - 5 %   Eosinophils Absolute 0.1 0.0 - 0.7 K/uL   Basophils Relative 0 0 - 1 %   Basophils Absolute 0.0 0.0 - 0.1 K/uL  Pregnancy, urine  Result Value Ref Range   Preg Test, Ur NEGATIVE NEGATIVE  Urinalysis, Routine w reflex microscopic  Result Value Ref Range   Color, Urine YELLOW YELLOW   APPearance HAZY (A) CLEAR    Specific Gravity, Urine 1.010 1.005 - 1.030   pH 6.5 5.0 - 8.0   Glucose, UA NEGATIVE NEGATIVE mg/dL   Hgb urine dipstick MODERATE (A) NEGATIVE   Bilirubin Urine NEGATIVE NEGATIVE   Ketones, ur NEGATIVE NEGATIVE mg/dL   Protein, ur NEGATIVE NEGATIVE mg/dL   Urobilinogen, UA 0.2 0.0 - 1.0 mg/dL   Nitrite NEGATIVE NEGATIVE   Leukocytes, UA MODERATE (A) NEGATIVE  Urine microscopic-add on  Result Value Ref Range   Squamous Epithelial / LPF FEW (A) RARE   WBC, UA 21-50 <3 WBC/hpf   RBC / HPF 3-6 <3 RBC/hpf   Bacteria, UA FEW (A) RARE  POC occult blood, ED  Result Value Ref Range   Fecal Occult Bld POSITIVE (A) NEGATIVE   Ct Abdomen Pelvis W Contrast 05/15/2015   CLINICAL DATA:  Bloody diarrhea and abdominal pain for the last month. Recent diagnosis of Clostridium diff  EXAM: CT ABDOMEN AND PELVIS WITH CONTRAST  TECHNIQUE: Multidetector CT imaging of the abdomen and pelvis was performed using the standard protocol following bolus administration of intravenous contrast.  CONTRAST:  OMNIPAQUE IOHEXOL 300 MG/ML SOLN, 57mL OMNIPAQUE IOHEXOL 300 MG/ML SOLN  COMPARISON:  04/29/2015  FINDINGS: Lung bases are clear. No pleural or pericardial fluid. The liver appears normal without focal lesions or biliary ductal dilatation. No calcified gallstones. The spleen is normal. The pancreas is normal. The adrenal glands are normal. The kidneys are normal except for a 15 mm cyst at the lower pole on the left. The aorta and IVC are normal. The uterus and adnexal regions are normal. No bowel pathology is discernible. No dilated or thick walled bowel. The appendix is normal. No significant bone finding. Mild lower lumbar facet degeneration.  IMPRESSION: Negative study.  No CT evidence of colitis at this time.   Electronically Signed  By: Paulina Fusi M.D.   On: 05/15/2015 15:57     1600:  T/C from GI Dr. Darrick Penna, case discussed, including:  HPI, pertinent PM/SHx, VS/PE, dx testing, ED course and treatment:   States pt as +cdiff, pt was sent to ED to speak with Social Worker to assist pt with cost of vancomycin.   1640:  No c/b from SW. No clear UTI on Udip and pt denies dysuria at this time; UC is pending.  T/C to GI Dr. Darrick Penna, case discussed, including:  HPI, pertinent PM/SHx, VS/PE, dx testing, ED course and treatment:  Don't treat now because pt cannot afford to fill the rx, states she will write rx for vancomycin and have her staff look into how to assist pt with cost. Dx and testing, as well as d/w GI MD, d/w pt and family.  Questions answered.  Verb understanding, agreeable to d/c home with outpt f/u.     Samuel Jester, DO 05/17/15 2211

## 2015-05-15 NOTE — Addendum Note (Signed)
Addended by: [place] Bali on: 05/15/2015 04:47 PM   Modules accepted: Orders

## 2015-05-15 NOTE — ED Notes (Signed)
Patient transported to CT 

## 2015-05-15 NOTE — Telephone Encounter (Signed)
Working on PA

## 2015-05-15 NOTE — Discharge Instructions (Signed)
°Emergency Department Resource Guide °1) Find a Doctor and Pay Out of Pocket °Although you won't have to find out who is covered by your insurance plan, it is a good idea to ask around and get recommendations. You will then need to call the office and see if the doctor you have chosen will accept you as a new patient and what types of options they offer for patients who are self-pay. Some doctors offer discounts or will set up payment plans for their patients who do not have insurance, but you will need to ask so you aren't surprised when you get to your appointment. ° °2) Contact Your Local Health Department °Not all health departments have doctors that can see patients for sick visits, but many do, so it is worth a call to see if yours does. If you don't know where your local health department is, you can check in your phone book. The CDC also has a tool to help you locate your state's health department, and many state websites also have listings of all of their local health departments. ° °3) Find a Walk-in Clinic °If your illness is not likely to be very severe or complicated, you may want to try a walk in clinic. These are popping up all over the country in pharmacies, drugstores, and shopping centers. They're usually staffed by nurse practitioners or physician assistants that have been trained to treat common illnesses and complaints. They're usually fairly quick and inexpensive. However, if you have serious medical issues or chronic medical problems, these are probably not your best option. ° °No Primary Care Doctor: °- Call Health Connect at  832-8000 - they can help you locate a primary care doctor that  accepts your insurance, provides certain services, etc. °- Physician Referral Service- 1-800-533-3463 ° °Chronic Pain Problems: °Organization         Address  Phone   Notes  °[facility]  (336) 297-2271 Patients need to be referred by their primary care doctor.  ° °Medication  Assistance: °Organization         Address  Phone   Notes  °[place] [facility] [address] °[place], [place] [address] (336) 641-8030 --Must be a resident of [place] °-- Must have NO insurance coverage whatsoever (no Medicaid/ Medicare, etc.) °-- The pt. MUST have a primary care doctor that directs their care regularly and follows them in the community °  °[facility]  (866) 331-1348   °[employer]  (888) 892-1162   ° °Agencies that provide inexpensive medical care: °Organization         Address  Phone   Notes  °[facility]  (336) 832-8035   °[facility]    (336) 832-7272   °[facility] [address] °[place], [place] [address] (336) 832-4777   °[facility] [address], °[place] (336) 271-4999   °[facility]    (336) 373-0678   °[facility]    (336) 272-1050   °[facility] ° [address], [place] Phone:  (336) 832-4444, Fax:  (336) 832-4440 Hours of Operation:  9 am - 6 pm, M-F.  Also accepts Medicaid/Medicare and self-pay.  °[facility] ° [address], [place] Phone: (336) 832-3150, Fax: (336) [phone]. Hours of Operation:  8:30 am - 5:30 pm, M-F.  Also accepts Medicaid and self-pay.  °[facility] [place] [address], [place] Phone: (336) 878-6027   °[facility] [address], [place], [place] (336)723-1848, Ext. 123 Mondays & Thursdays: 7-9 AM.  First 15 patients are seen on a first come, first serve basis. °  ° °Medicaid-accepting [place] Providers: ° °Organization         Address  Phone   Notes  °[facility] [address], [place] (336) 641-2100 Also accepts self-pay patients.  °[facility] [address], [place] ° (336) 856-9996   °[facility] [address], [place]  (336) 288-8857   °[facility] [address] [place] [address], [place] (336) 299-7000   °Veita Bland [address], [place]  ° (336) 373-1557 Only accepts [place] Access Medicaid patients after they have their name applied to their card.  ° °Self-Pay (no insurance) in [place]: ° °Organization         Address  Phone   Notes  °Sickle Cell Patients, [facility] [address], [place] (336) 832-1970   °[facility] [address], [place] (336) 832-4400   °[facility] ° [address], [place] (336) 992-4800   °[facility]/Dr. Osei-Bonsu ° [address] [place] [address], [place] or [address], [place] (336) 841-8500 Phone number for both [place] and [place] locations is the same.  °[facility] [address], [place] (336) 299-0000   °[facility] [address] [place] [address], [place] or [address] (336) 852-7530 °(336) 878-2260   °[facility] [address], [place] (336) 350-1642, phone; (336) 294-5005, fax Sees patients 1st and 3rd Saturday of every month.  Must not qualify for public or private insurance (i.e. Medicaid, Medicare, [place] Health Choice, Veterans' Benefits) • Household income should be no more than 200% of the poverty level •The clinic cannot treat you if you are pregnant or think you are pregnant • Sexually transmitted diseases are not treated at the clinic.  ° ° °Dental Care: °Organization         Address  Phone  Notes  °[place] [facility] [address], [place] (336) 641-6152 Accepts children up to age 21 who are enrolled in Medicaid or [place] Health Choice; pregnant women with a Medicaid card; and children who have applied for Medicaid or [place] Health Choice, but were declined, whose parents can pay a reduced fee at time of service.  °[place]  [facility] [place]  [address], [place] (336) 641-7733 Accepts children up to age 21 who are enrolled in Medicaid or [place] Health Choice; pregnant women with a Medicaid card; and children who have applied for Medicaid or [place] Health Choice, but were declined, whose parents can pay a reduced fee at time of service.  °[facility] ° [address], [place] (336) 641-4533 Patients are seen by appointment only. Walk-ins are not accepted. [facility] will see patients 18 years of age and older. °Monday - Tuesday (8am-5pm) °Most Wednesdays (8:30-5pm) °$30 per visit, cash only  °[facility] ° [address], [place] (336) 641-4533 Patients are seen by appointment only. Walk-ins are not accepted. [facility] will see patients 18 years of age and older. °One   Wednesday Evening (Monthly: Volunteer Based).  $30 per visit, cash only  °[facility]  (919) 537-3737 for adults; Children under age 4, call Graduate Pediatric Dentistry at (919) [phone]. Children aged 4-14, please call (919) [phone] to request a pediatric application. ° Dental services are provided in all areas of dental care including fillings, crowns and bridges, complete and partial dentures, implants, gum treatment, root canals, and extractions. Preventive care is also provided. Treatment is provided to both adults and children. °Patients are selected via a lottery and there is often a waiting list. °  °[facility] [address], °[place] ° (336) 763-8833 [email or web address] °  °[facility] [address], [place], [place] (336)723-1848, Ext. 123 Second and Fourth Thursday of each month, opens at 6:30 AM; Clinic ends at 9 AM.  Patients are seen on a first-come first-served basis, and a limited number are seen during each clinic.  ° °[facility] ° [address], [place], [place] (336) 723-7904    Eligibility Requirements °You must have lived in [place], or [place] counties for at least the last three months. °  You cannot be eligible for state or federal sponsored healthcare insurance, including Veterans Administration, Medicaid, or Medicare. °  You generally cannot be eligible for healthcare insurance through your employer.  °  How to apply: °Eligibility screenings are held every Tuesday and Wednesday afternoon from 1:00 pm until 4:00 pm. You do not need an appointment for the interview!  °[facility] [address], [place], [place] 336-631-2330   °[facility]  336-342-8273   °[facility]  336-703-3100   °[facility]  336-570-6415   ° °Behavioral Health Resources in the Community: °Intensive Outpatient Programs °Organization         Address  Phone  Notes  °[facility] [address], [place], [place] 336-878-6098   °[facility] [address], [place], [place] 336-832-9800   °[facility] [address], [place], [place] ° 336-882-2125   °[facility] [address],  °[place], [place] 1-800-853-5163 or 336-641-4981   °Substance Abuse Resources °Organization         Address  Phone  Notes  °[facility]  336-882-2125   °[facility]  336-784-9470   °[facility]  336-285-9073   °[facility]  336-845-3988   °Residential & Outpatient Substance Abuse Program  1-800-659-3381   °Psychological Services °Organization         Address  Phone  Notes  °[facility]  336- 832-9600   °[facility]  336- 378-7881   °[facility] [address], [place] 1-800-853-5163 or 336-641-4981   ° °Mobile Crisis Teams °Organization         Address  Phone  Notes  °[facility], Mobile Crisis Care Unit  1-877-626-1772   °Assertive °[facility] ° [address]  [place], [place] 336-834-9664   °Sharon DeEsch [address] °[place] 336-554-5454   ° °Self-Help/Support Groups °Organization         Address  Phone             Notes  °[facility]  - variety of support groups  336- 373-1402 Call for more information  °Narcotics Anonymous (NA), [facility] [address], °[place]  2 meetings at this location  ° °  Residential Treatment Programs Organization         Address  Phone  Notes  [facility] [address]    [place]  5-400-867-6195   [facility]  [address] [place] [address], [place], [place] 124-580-9983   [facility] [address] [place], [place] 382-505-3976 Admissions: 8am-3pm M-F  [facility] [address],    [place], [place] 734-193-7902   [facility] [address] [place], [place], [place] 409-735-3299   [facility] [address],  [place], [place] 242-683-4196   [facility] Intensive Outpatient [address], [place], [place] 222-979-8921   [facility] ([facility] [address]  [place], [place] 1-941-740-8144 or 249-631-2630   [facility] ([facility] [address] [place], [place] 026-378-5885 Accepts Medicaid  [facility] [address]  [place] 0-277-412-8786 Substance Abuse/Addiction Treatment   [facility]         Address  Phone  Notes  [facility]  669-197-2002   Angie Fava, PhD [address] [place], [place]   616-170-8386 or 551-068-1900   [facility]   [address] [place], [place] 701-281-8725   [facility] [address] [place], [place] 702-653-3544 Insurance/Medicaid/sponsorship through [facility] [address]                                    [place], [place] [phone] Therapy/tele-psych/case    [facility] [address], [place] (682) [phone]    Dr. Lolly Mustache  [phone]   [facility]  [employer] [facility] Dept. 1) [address] [place] 2) [address], [place] 3)  [address], [place] [phone]  [phone]   [facility] Child Abuse Hotline (340) 711-5076 or 832-546-2560 (After Hours)      Dr Darrick Penna has written you a prescription for vancomycin.  Increase your fluid intake (ie:  Gatoraide) for the next few days.  Eat a bland diet and advance to your regular diet slowly as you can tolerate it.  Call your GI doctor tomorrow to schedule a follow up appointment within the next 1 to 2 days.  Return to the Emergency Department immediately sooner if worsening.

## 2015-05-15 NOTE — Telephone Encounter (Signed)
Pt called back, said she is having abdominal pain and she rates the pain at a 9. I told her Dr. Darrick Penna would tell her to go to the ED with pain like thatl She said she went to the ED yesterday and they did not do any thing for her. Please advise!

## 2015-05-15 NOTE — ED Notes (Signed)
Pt provided urine specimen that was contaminated with blood.  Nurse will re collect sample

## 2015-05-15 NOTE — Telephone Encounter (Signed)
Pt in ED. SPOKE TO DR. Clarene Duke. PT NEED VANCOMYCIN BECAUSE SHE FAILED ERADICATION WITH FLAGYL. CAN'T AFFORD VANC. NEEDS VANCOMYCIN 250 MG Q6H FOR 14 DAYS THE Q8H FOR 7 DAYS THE Q12H FOR 7 DAYS. SHE WILL CONSULT SOCIAL WORK. WILL CALL IF THEY ARE UNABLE TO OBTAIN MEDS.

## 2015-05-15 NOTE — Telephone Encounter (Signed)
PT called this morning and said she needs to be seen today. Said she went to the ED yesterday and left with out any medication or anything. She said that she had been told that Dr. Darrick Penna would have to do PA on Vancomycin, but the Rx has not been sent to the pharmacy. Please advise!

## 2015-05-16 LAB — URINE CULTURE: CULTURE: NO GROWTH

## 2015-05-16 MED ORDER — HYDROCODONE-ACETAMINOPHEN 5-325 MG PO TABS: ORAL_TABLET | ORAL | Status: DC

## 2015-05-16 NOTE — Addendum Note (Signed)
Addended by: [place] on: 05/16/2015 10:16 AM   Modules accepted: Orders

## 2015-05-16 NOTE — Telephone Encounter (Signed)
Pt is aware.  

## 2015-05-16 NOTE — Telephone Encounter (Signed)
Pt called and I informed her that Raynelle Fanning is working on the PA for Vancomycin. She said the ED wanted her to be seen here today. I told her Dr. Darrick Penna is aware of the C-Diff and she would have advised if she needed to come in. She wants to know if she can have a Rx for pain med while she is waiting for the Vancomycin. Please advise!

## 2015-05-16 NOTE — Telephone Encounter (Signed)
Pt is aware and Rx at front for pick up.

## 2015-05-16 NOTE — Telephone Encounter (Signed)
PLEASE CALL PT. SHE DOES NOT NEED TO BE SEEN TODAY. SHE CAN HAVE A FAMILY MEMBER PICK UP A RX FOR PAIN MEDS AT THE OFC. SHE IS CONTAGIOUS AND SHE NEEDS TO REMAIN IN HER HOME UNTIL HER VANCOMYCIN IS IMPROVED AND HER DIARRHEA HAS RESOLVED.

## 2015-05-16 NOTE — Telephone Encounter (Signed)
PA for vanc has been approved and faxed to the pharmacy.

## 2015-05-18 LAB — GI PATHOGEN PANEL BY PCR, STOOL

## 2015-05-20 ENCOUNTER — Telehealth: Payer: Self-pay | Admitting: Gastroenterology

## 2015-05-20 ENCOUNTER — Other Ambulatory Visit: Payer: Self-pay

## 2015-05-20 DIAGNOSIS — R109 Unspecified abdominal pain: Secondary | ICD-10-CM

## 2015-05-20 LAB — CBC WITH DIFFERENTIAL/PLATELET
BASOS ABS: 0 10*3/uL (ref 0.0–0.1)
Basophils Relative: 0 % (ref 0–1)
EOS ABS: 0.1 10*3/uL (ref 0.0–0.7)
Eosinophils Relative: 1 % (ref 0–5)
HCT: 37.2 % (ref 36.0–46.0)
Hemoglobin: 12.5 g/dL (ref 12.0–15.0)
LYMPHS ABS: 2.6 10*3/uL (ref 0.7–4.0)
LYMPHS PCT: 35 % (ref 12–46)
MCH: 29 pg (ref 26.0–34.0)
MCHC: 33.6 g/dL (ref 30.0–36.0)
MCV: 86.3 fL (ref 78.0–100.0)
MPV: 8.8 fL (ref 8.6–12.4)
Monocytes Absolute: 0.4 10*3/uL (ref 0.1–1.0)
Monocytes Relative: 5 % (ref 3–12)
NEUTROS PCT: 59 % (ref 43–77)
Neutro Abs: 4.3 10*3/uL (ref 1.7–7.7)
PLATELETS: 288 10*3/uL (ref 150–400)
RBC: 4.31 MIL/uL (ref 3.87–5.11)
RDW: 14.1 % (ref 11.5–15.5)
WBC: 7.3 10*3/uL (ref 4.0–10.5)

## 2015-05-20 NOTE — Telephone Encounter (Signed)
PATIENT CALLED WANTING TO KNOW WHEN DR WAS GOING TO SIGN OFF ON HER PRESCRIPTION [phone]

## 2015-05-20 NOTE — Telephone Encounter (Signed)
[phone]  Patient needs refill on her hydrocodone.   Please advise

## 2015-05-20 NOTE — Telephone Encounter (Signed)
Needs CBC and CMP now. When did she start the vancomycin? She was prescribed Vanc 250 QID X 14 days then TID for 7 days then BID for 7 days. Might need increased vanc to 500 QID and tapering adjusted thereafter, but I want to see what her labs are doing and how long she has been on vanc. Recent CT is on file.

## 2015-05-20 NOTE — Telephone Encounter (Signed)
Pt has received and started on her Vancomycin. She is still having a lot of diarrhea and stomach pain. Tobi Bastos, will you please advise in Dr. Evelina Dun absence today.

## 2015-05-20 NOTE — Telephone Encounter (Signed)
Pt is aware we have ordered the labs to find out what's going on, and no pain meds to be given at this time. I told her if her pain gets severe to go to the ED. She said it just feels like trapped gas.

## 2015-05-20 NOTE — Telephone Encounter (Signed)
Pt is aware to go [facility] for the blood work. Lab orders faxed to [facility]. Pt said she started the Vancomycin Friday.

## 2015-05-21 ENCOUNTER — Emergency Department (HOSPITAL_COMMUNITY)
Admission: EM | Admit: 2015-05-21 | Discharge: 2015-05-21 | Disposition: A | Discharge status: Home / Self Care | Payer: Medicare Other | Attending: Emergency Medicine | Admitting: Emergency Medicine

## 2015-05-21 ENCOUNTER — Encounter (HOSPITAL_COMMUNITY): Payer: Self-pay | Admitting: Emergency Medicine

## 2015-05-21 DIAGNOSIS — Z87440 Personal history of urinary (tract) infections: Secondary | ICD-10-CM | POA: Diagnosis not present

## 2015-05-21 DIAGNOSIS — R300 Dysuria: Secondary | ICD-10-CM | POA: Insufficient documentation

## 2015-05-21 DIAGNOSIS — Z79899 Other long term (current) drug therapy: Secondary | ICD-10-CM | POA: Insufficient documentation

## 2015-05-21 DIAGNOSIS — R140 Abdominal distension (gaseous): Secondary | ICD-10-CM | POA: Diagnosis not present

## 2015-05-21 DIAGNOSIS — Z880 Allergy status to penicillin: Secondary | ICD-10-CM | POA: Insufficient documentation

## 2015-05-21 DIAGNOSIS — E119 Type 2 diabetes mellitus without complications: Secondary | ICD-10-CM | POA: Diagnosis not present

## 2015-05-21 DIAGNOSIS — R109 Unspecified abdominal pain: Secondary | ICD-10-CM | POA: Insufficient documentation

## 2015-05-21 DIAGNOSIS — I10 Essential (primary) hypertension: Secondary | ICD-10-CM | POA: Diagnosis not present

## 2015-05-21 DIAGNOSIS — A047 Enterocolitis due to Clostridium difficile: Secondary | ICD-10-CM | POA: Diagnosis not present

## 2015-05-21 DIAGNOSIS — G8929 Other chronic pain: Secondary | ICD-10-CM | POA: Diagnosis not present

## 2015-05-21 DIAGNOSIS — R197 Diarrhea, unspecified: Secondary | ICD-10-CM | POA: Diagnosis present

## 2015-05-21 DIAGNOSIS — M797 Fibromyalgia: Secondary | ICD-10-CM | POA: Diagnosis not present

## 2015-05-21 DIAGNOSIS — M069 Rheumatoid arthritis, unspecified: Secondary | ICD-10-CM | POA: Insufficient documentation

## 2015-05-21 LAB — COMPREHENSIVE METABOLIC PANEL
ALK PHOS: 33 U/L (ref 33–115)
ALT: 11 U/L (ref 6–29)
AST: 16 U/L (ref 10–30)
Albumin: 4.1 g/dL (ref 3.6–5.1)
BILIRUBIN TOTAL: 0.3 mg/dL (ref 0.2–1.2)
BUN: 8 mg/dL (ref 7–25)
CO2: 23 mmol/L (ref 20–31)
CREATININE: 0.66 mg/dL (ref 0.50–1.10)
Calcium: 9.1 mg/dL (ref 8.6–10.2)
Chloride: 102 mmol/L (ref 98–110)
Glucose, Bld: 94 mg/dL (ref 65–99)
POTASSIUM: 3.8 mmol/L (ref 3.5–5.3)
Sodium: 137 mmol/L (ref 135–146)
TOTAL PROTEIN: 6.6 g/dL (ref 6.1–8.1)

## 2015-05-21 LAB — URINALYSIS, ROUTINE W REFLEX MICROSCOPIC
Bilirubin Urine: NEGATIVE
Glucose, UA: NEGATIVE mg/dL
Ketones, ur: NEGATIVE mg/dL
Leukocytes, UA: NEGATIVE
NITRITE: NEGATIVE
PROTEIN: NEGATIVE mg/dL
SPECIFIC GRAVITY, URINE: 1.025 (ref 1.005–1.030)
UROBILINOGEN UA: 0.2 mg/dL (ref 0.0–1.0)
pH: 6 (ref 5.0–8.0)

## 2015-05-21 LAB — URINE MICROSCOPIC-ADD ON

## 2015-05-21 MED ORDER — DICYCLOMINE HCL 20 MG PO TABS: 20.0000 mg | ORAL_TABLET | Freq: Two times a day (BID) | ORAL | Status: DC

## 2015-05-21 MED ORDER — DICYCLOMINE HCL 10 MG PO CAPS
10.0000 mg | ORAL_CAPSULE | Freq: Once | ORAL | Status: AC
  Administered 2015-05-21: 10 mg via ORAL
  Filled 2015-05-21: qty 1

## 2015-05-21 NOTE — Telephone Encounter (Signed)
Tried to call pt. Many rings and no answer.  

## 2015-05-21 NOTE — ED Notes (Signed)
Pt made aware to return if symptoms worsen or if any life threatening symptoms occur.   

## 2015-05-21 NOTE — ED Notes (Signed)
Having diarrhea and bloating.  Abdominal pain 9/10.  Pt also says she is having vaginal burning when voiding.

## 2015-05-21 NOTE — Telephone Encounter (Signed)
Pt called and was informed of the results. She said she has been having bad abdominal cramps and has been up since 3:00 Am this morning. ( SHE DID NOT TELL ME SHE HAD BEEN TO THE ED) She said had abdomin is swollen also. Tobi Bastos, please advise!

## 2015-05-21 NOTE — Telephone Encounter (Signed)
None of her labs are concerning. Her vitals are stable. From looking at the ED note, her exam is benign. She has had multiple CT scans this year alone.   How many loose stools is she having a day? Copying Dr. Darrick Penna for FYI.

## 2015-05-21 NOTE — Telephone Encounter (Signed)
CBC and CMP completely normal, which is good. I wanted to make sure she did not have a change in renal function or evidence of leukocytosis. How is patient doing today?

## 2015-05-21 NOTE — Telephone Encounter (Signed)
Pt said she is having 6-8 loose stools daily.

## 2015-05-21 NOTE — ED Provider Notes (Signed)
CSN: [id number]     Arrival date & time 05/21/15  1253 History   First MD Initiated Contact with Patient 05/21/15 1358     Chief Complaint  Patient presents with  . Diarrhea    c-diff  . Bloated      HPI Patient was recent seen and evaluated by her gastroenterologist.  She's currently taking vancomycin for C. difficile colitis.  She reports bloating and ongoing diarrhea.  She denies fevers and chills.  No nausea or vomiting.  Her symptoms are mild to moderate in severity.  She also reports some dysuria without urinary frequency.  She has a history of recurrent urinary tract infections however recurrent antibiotics for urinary tract infections is what led to her C. difficile.  She is hesitant to start antibiotics given her current C. difficile infection.   Past Medical History  Diagnosis Date  . Hypertension   . Chronic back pain   . Sciatic pain     right  . Rib pain on right side     chronic  . Chronic neck pain   . Cervical radiculopathy   . Chronic knee pain   . Rheumatoid arthritis     "Rhematoid factor positive but no symptoms" per EPIC notes 2012  . Fibromyalgia   . Diabetes mellitus without complication   . C. difficile diarrhea    Past Surgical History  Procedure Laterality Date  . Tubal ligation    . Bladder surgery     Family History  Problem Relation Age of Onset  . Cancer Mother   . Diabetes Mother   . Colon cancer Father     AFTER AGE 89  . Crohn's disease Sister    Social History  Substance Use Topics  . Smoking status: Never Smoker   . Smokeless tobacco: Never Used  . Alcohol Use: No   OB History    Gravida Para Term Preterm AB TAB SAB Ectopic Multiple Living   3 3 3       3      Review of Systems  All other systems reviewed and are negative.     Allergies  Mango flavor; Penicillins; Tramadol; Bactrim; Keflex; and Toradol  Home Medications   Prior to Admission medications   Medication Sig Start Date End Date Taking? Authorizing  Provider  lisinopril-hydrochlorothiazide (PRINZIDE,ZESTORETIC) 20-25 MG per tablet Take 2 tablets by mouth every morning.    Yes Historical Provider, MD  vancomycin (VANCOCIN) 250 MG capsule 1 PO Q6H FOR 7 DAYS THEN PO Q8H FOR 7 DAYS THEN PO Q12H FOR 7 DAYS THEN PO QD FOR 7 DAYS 05/15/15  Yes 07/15/15, MD  venlafaxine XR (EFFEXOR-XR) 75 MG 24 hr capsule Take 225 mg by mouth at bedtime.    Yes Historical Provider, MD  diphenoxylate-atropine (LOMOTIL) 2.5-0.025 MG per tablet Take 2 tablets by mouth 4 (four) times daily as needed for diarrhea or loose stools. Patient not taking: Reported on 05/09/2015 05/04/15   05/06/15, MD  HYDROcodone-acetaminophen (NORCO/VICODIN) 5-325 MG per tablet 1 OR 2 EVERY 4-6 H PRN FOR PAIN Patient not taking: Reported on 05/21/2015 05/16/15   07/16/15, MD  ondansetron (ZOFRAN) 4 MG tablet Take 1 tablet (4 mg total) by mouth every 6 (six) hours as needed for nausea or vomiting. Patient not taking: Reported on 05/21/2015 05/09/15   07/09/15, MD  oxyCODONE-acetaminophen (PERCOCET) 5-325 MG per tablet Take 1-2 tablets by mouth every 4 (four) hours as needed. Patient not taking:  Reported on 05/09/2015 05/04/15   Gilda Crease, MD  promethazine (PHENERGAN) 25 MG tablet Take 1 tablet (25 mg total) by mouth every 6 (six) hours as needed for nausea or vomiting. Patient not taking: Reported on 05/21/2015 05/04/15   Gilda Crease, MD   BP 157/72 mmHg  Pulse 88  Temp(Src) 98.3 F (36.8 C) (Oral)  Resp 18  Ht 5' (1.524 m)  Wt 198 lb (89.812 kg)  BMI 38.67 kg/m2  SpO2 99%  LMP 05/13/2015 Physical Exam  Constitutional: She is oriented to person, place, and time. She appears well-developed and well-nourished. No distress.  HENT:  Head: Normocephalic and atraumatic.  Eyes: EOM are normal.  Neck: Normal range of motion.  Cardiovascular: Normal rate, regular rhythm and normal heart sounds.   Pulmonary/Chest: Effort normal and breath sounds normal.   Abdominal: Soft. She exhibits no distension. There is no tenderness.  Musculoskeletal: Normal range of motion.  Neurological: She is alert and oriented to person, place, and time.  Skin: Skin is warm and dry.  Psychiatric: She has a normal mood and affect. Judgment normal.  Nursing note and vitals reviewed.   ED Course  Procedures (including critical care time) Labs Review Labs Reviewed  URINALYSIS, ROUTINE W REFLEX MICROSCOPIC (NOT AT [facility]) - Abnormal; Notable for the following:    Hgb urine dipstick MODERATE (*)    All other components within normal limits  URINE MICROSCOPIC-ADD ON - Abnormal; Notable for the following:    Squamous Epithelial / LPF MANY (*)    Bacteria, UA FEW (*)    All other components within normal limits    Imaging Review No results found. I have personally reviewed and evaluated these images and lab results as part of my medical decision-making.   EKG Interpretation None      MDM   Final diagnoses:  None    Vital signs are normal this time.  Her abdominal exam is benign.  Urine culture will be sent.  She is early on antibiotics for C. difficile.  I will add Bentyl for cramping and bloating.    Azalia Bilis, MD 05/21/15 [phone]

## 2015-05-22 NOTE — Telephone Encounter (Signed)
I called and spoke to the pt. She is not taking the Bentyl. She said they gave her a dose at the hospital and it made her stomach cramp more. She had 9 watery stools today and it looked like seeds in it. She is drinking liquids and staying hydrated, but she does not have any appetite.

## 2015-05-22 NOTE — Telephone Encounter (Signed)
She does not have any laboratory indicators of severe disease. Literature is mixed regarding efficacy of high dose Vanc vs dosage she is already on. She is not going to go back to normal BMs overnight. If she is staying hydrated, urine clear, tolerating diet, then I think we need to continue the vancomycin dosing she is on. The ED gave her Bentyl. Need to be cautious with this in the setting of Cdiff. However, if she has started it, I am curious as to how she is doing with it? Would want to monitor for any sudden cessation of BMs, distended abdomen, etc. I would limit Bentyl significantly to a maximum of 2 doses a day.

## 2015-05-23 ENCOUNTER — Telehealth: Payer: Self-pay | Admitting: Gastroenterology

## 2015-05-23 LAB — URINE CULTURE

## 2015-05-23 MED ORDER — CHOLESTYRAMINE 4 G PO PACK: 2.0000 g | PACK | Freq: Every day | ORAL | Status: DC

## 2015-05-23 MED ORDER — HYDROCODONE-ACETAMINOPHEN 5-325 MG PO TABS: ORAL_TABLET | ORAL | Status: DC

## 2015-05-23 NOTE — Telephone Encounter (Signed)
I sent in questran 2 grams (1/2 packet) to take daily for supportive measures. This may help with diarrhea. I am hesitant to increase her vanc dosing because she does not fit criteria for severe disease and the 500 mg dosing has been shown to be similar to standard dosing, which she is on. Monitor for constipation and cut back if this happens.

## 2015-05-23 NOTE — Addendum Note (Signed)
Addended by: Nira Retort on: 05/23/2015 10:27 AM   Modules accepted: Orders

## 2015-05-23 NOTE — Telephone Encounter (Signed)
Pt aware. She said she will pick it up today. I informed her that there were no refills on this. She verbalized understanding.

## 2015-05-23 NOTE — Telephone Encounter (Signed)
I have printed a prescription for Norco # 20. No further refills.

## 2015-05-23 NOTE — Telephone Encounter (Signed)
Pt called to see if [facility] or [facility] would write her a prescription for something for pain. She is tire of going to the ED for c-diff and wants something to help her pain. Please call [phone]

## 2015-05-23 NOTE — Addendum Note (Signed)
Addended by: Nira Retort on: 05/23/2015 01:23 PM   Modules accepted: Orders

## 2015-05-26 ENCOUNTER — Encounter (HOSPITAL_COMMUNITY): Payer: Self-pay | Admitting: Emergency Medicine

## 2015-05-26 ENCOUNTER — Emergency Department (HOSPITAL_COMMUNITY)
Admission: EM | Admit: 2015-05-26 | Discharge: 2015-05-26 | Disposition: A | Discharge status: Home / Self Care | Payer: Medicare Other | Attending: Physician Assistant | Admitting: Physician Assistant

## 2015-05-26 DIAGNOSIS — Z79899 Other long term (current) drug therapy: Secondary | ICD-10-CM | POA: Diagnosis not present

## 2015-05-26 DIAGNOSIS — I10 Essential (primary) hypertension: Secondary | ICD-10-CM | POA: Insufficient documentation

## 2015-05-26 DIAGNOSIS — R42 Dizziness and giddiness: Secondary | ICD-10-CM | POA: Insufficient documentation

## 2015-05-26 DIAGNOSIS — R112 Nausea with vomiting, unspecified: Secondary | ICD-10-CM | POA: Insufficient documentation

## 2015-05-26 DIAGNOSIS — A047 Enterocolitis due to Clostridium difficile: Secondary | ICD-10-CM | POA: Diagnosis not present

## 2015-05-26 DIAGNOSIS — M797 Fibromyalgia: Secondary | ICD-10-CM | POA: Insufficient documentation

## 2015-05-26 DIAGNOSIS — E119 Type 2 diabetes mellitus without complications: Secondary | ICD-10-CM | POA: Diagnosis not present

## 2015-05-26 DIAGNOSIS — R1084 Generalized abdominal pain: Secondary | ICD-10-CM | POA: Diagnosis not present

## 2015-05-26 DIAGNOSIS — M069 Rheumatoid arthritis, unspecified: Secondary | ICD-10-CM | POA: Diagnosis not present

## 2015-05-26 DIAGNOSIS — R110 Nausea: Secondary | ICD-10-CM

## 2015-05-26 DIAGNOSIS — G8929 Other chronic pain: Secondary | ICD-10-CM | POA: Insufficient documentation

## 2015-05-26 DIAGNOSIS — Z880 Allergy status to penicillin: Secondary | ICD-10-CM | POA: Insufficient documentation

## 2015-05-26 LAB — URINALYSIS, ROUTINE W REFLEX MICROSCOPIC
BILIRUBIN URINE: NEGATIVE
Glucose, UA: NEGATIVE mg/dL
KETONES UR: NEGATIVE mg/dL
Leukocytes, UA: NEGATIVE
NITRITE: NEGATIVE
PH: 8 (ref 5.0–8.0)
Protein, ur: NEGATIVE mg/dL
Specific Gravity, Urine: 1.02 (ref 1.005–1.030)
Urobilinogen, UA: 0.2 mg/dL (ref 0.0–1.0)

## 2015-05-26 LAB — CBC WITH DIFFERENTIAL/PLATELET
BASOS ABS: 0 10*3/uL (ref 0.0–0.1)
BASOS PCT: 0 %
EOS PCT: 1 %
Eosinophils Absolute: 0.1 10*3/uL (ref 0.0–0.7)
HCT: 36.8 % (ref 36.0–46.0)
Hemoglobin: 12.8 g/dL (ref 12.0–15.0)
Lymphocytes Relative: 36 %
Lymphs Abs: 3 10*3/uL (ref 0.7–4.0)
MCH: 30.3 pg (ref 26.0–34.0)
MCHC: 34.8 g/dL (ref 30.0–36.0)
MCV: 87 fL (ref 78.0–100.0)
MONO ABS: 0.4 10*3/uL (ref 0.1–1.0)
Monocytes Relative: 5 %
Neutro Abs: 4.9 10*3/uL (ref 1.7–7.7)
Neutrophils Relative %: 58 %
PLATELETS: 298 10*3/uL (ref 150–400)
RBC: 4.23 MIL/uL (ref 3.87–5.11)
RDW: 13.2 % (ref 11.5–15.5)
WBC: 8.5 10*3/uL (ref 4.0–10.5)

## 2015-05-26 LAB — COMPREHENSIVE METABOLIC PANEL
ALBUMIN: 3.9 g/dL (ref 3.5–5.0)
ALK PHOS: 32 U/L — AB (ref 38–126)
ALT: 15 U/L (ref 14–54)
AST: 17 U/L (ref 15–41)
Anion gap: 7 (ref 5–15)
BILIRUBIN TOTAL: 0.4 mg/dL (ref 0.3–1.2)
BUN: 9 mg/dL (ref 6–20)
CALCIUM: 8.3 mg/dL — AB (ref 8.9–10.3)
CO2: 25 mmol/L (ref 22–32)
Chloride: 100 mmol/L — ABNORMAL LOW (ref 101–111)
Creatinine, Ser: 0.64 mg/dL (ref 0.44–1.00)
GFR calc Af Amer: >60 mL/min (ref >60)
GFR calc non Af Amer: >60 mL/min (ref >60)
GLUCOSE: 111 mg/dL — AB (ref 65–99)
Potassium: 3.4 mmol/L — ABNORMAL LOW (ref 3.5–5.1)
Sodium: 132 mmol/L — ABNORMAL LOW (ref 135–145)
TOTAL PROTEIN: 7.2 g/dL (ref 6.5–8.1)

## 2015-05-26 LAB — URINE MICROSCOPIC-ADD ON

## 2015-05-26 MED ORDER — SODIUM CHLORIDE 0.9 % IV BOLUS (SEPSIS)
1000.0000 mL | Freq: Once | INTRAVENOUS | Status: AC
  Administered 2015-05-26: 1000 mL via INTRAVENOUS

## 2015-05-26 MED ORDER — PROMETHAZINE HCL 25 MG/ML IJ SOLN
12.5000 mg | Freq: Once | INTRAMUSCULAR | Status: AC
  Administered 2015-05-26: 12.5 mg via INTRAVENOUS
  Filled 2015-05-26: qty 1

## 2015-05-26 MED ORDER — OXYCODONE-ACETAMINOPHEN 5-325 MG PO TABS
1.0000 | ORAL_TABLET | Freq: Once | ORAL | Status: AC
  Administered 2015-05-26: 1 via ORAL
  Filled 2015-05-26: qty 1

## 2015-05-26 MED ORDER — PROMETHAZINE HCL 25 MG PO TABS: 25.0000 mg | ORAL_TABLET | Freq: Four times a day (QID) | ORAL | Status: DC | PRN

## 2015-05-26 NOTE — ED Provider Notes (Signed)
CSN: [id number]     Arrival date & time 05/26/15  1831 History   This chart was scribed for Carreen Milius Randall An, MD by Arlan Organ, ED Scribe. This patient was seen in room APA09/[facility]09 and the patient's care was started 9:48 PM.   Chief Complaint  Patient presents with  . Emesis  . Diarrhea  . Abdominal Pain   The history is provided by the patient. No language interpreter was used.    HPI Comments: Regina Patton is a 44 y.o. female with a PMHx of HTN and DM who presents to the Emergency Department complaining of constant, ongoing vomiting x 1 month. Pt states she is unable to keep any fluids or solid foods down at this time. Associated abdominal pain, diarrhea, and new onset dizziness also reported. Pt is currently being treated for C. Difficile. She is due to take her last dose of Vancomycin tomorrow. However, she states she was unable to take her dose today. Prescribed Zofran attempted today without any improvement. No recent fever or chills.  Past Medical History  Diagnosis Date  . Hypertension   . Chronic back pain   . Sciatic pain     right  . Rib pain on right side     chronic  . Chronic neck pain   . Cervical radiculopathy   . Chronic knee pain   . Rheumatoid arthritis     "Rhematoid factor positive but no symptoms" per EPIC notes 2012  . Fibromyalgia   . Diabetes mellitus without complication   . C. difficile diarrhea    Past Surgical History  Procedure Laterality Date  . Tubal ligation    . Bladder surgery     Family History  Problem Relation Age of Onset  . Cancer Mother   . Diabetes Mother   . Colon cancer Father     AFTER AGE 87  . Crohn's disease Sister    Social History  Substance Use Topics  . Smoking status: Never Smoker   . Smokeless tobacco: Never Used  . Alcohol Use: No   OB History    Gravida Para Term Preterm AB TAB SAB Ectopic Multiple Living   3 3 3       3      Review of Systems  Constitutional: Negative for fever and chills.   Respiratory: Negative for cough and shortness of breath.   Cardiovascular: Negative for chest pain.  Gastrointestinal: Positive for nausea, vomiting, abdominal pain and diarrhea.  Musculoskeletal: Negative for back pain.  Neurological: Negative for headaches.  Psychiatric/Behavioral: Negative for confusion.  All other systems reviewed and are negative.     Allergies  Mango flavor; Penicillins; Tramadol; Bactrim; Keflex; and Toradol  Home Medications   Prior to Admission medications   Medication Sig Start Date End Date Taking? Authorizing Provider  cholestyramine (QUESTRAN) 4 G packet Take 0.5 packets (2 g total) by mouth daily. 05/23/15   05/25/15, NP  dicyclomine (BENTYL) 20 MG tablet Take 1 tablet (20 mg total) by mouth 2 (two) times daily. 05/21/15   05/23/15, MD  diphenoxylate-atropine (LOMOTIL) 2.5-0.025 MG per tablet Take 2 tablets by mouth 4 (four) times daily as needed for diarrhea or loose stools. Patient not taking: Reported on 05/09/2015 05/04/15   05/06/15, MD  HYDROcodone-acetaminophen (NORCO/VICODIN) 5-325 MG per tablet 1 OR 2 EVERY 4-6 H PRN FOR PAIN 05/23/15   05/25/15, NP  lisinopril-hydrochlorothiazide (PRINZIDE,ZESTORETIC) 20-25 MG per tablet Take 2 tablets by mouth every  morning.     Historical Provider, MD  ondansetron (ZOFRAN) 4 MG tablet Take 1 tablet (4 mg total) by mouth every 6 (six) hours as needed for nausea or vomiting. Patient not taking: Reported on 05/21/2015 05/09/15   West Bali, MD  oxyCODONE-acetaminophen (PERCOCET) 5-325 MG per tablet Take 1-2 tablets by mouth every 4 (four) hours as needed. Patient not taking: Reported on 05/09/2015 05/04/15   Gilda Crease, MD  promethazine (PHENERGAN) 25 MG tablet Take 1 tablet (25 mg total) by mouth every 6 (six) hours as needed for nausea or vomiting. Patient not taking: Reported on 05/21/2015 05/04/15   Gilda Crease, MD  vancomycin (VANCOCIN) 250 MG capsule 1 PO Q6H FOR 7 DAYS  THEN PO Q8H FOR 7 DAYS THEN PO Q12H FOR 7 DAYS THEN PO QD FOR 7 DAYS 05/15/15   West Bali, MD  venlafaxine XR (EFFEXOR-XR) 75 MG 24 hr capsule Take 225 mg by mouth at bedtime.     Historical Provider, MD   Triage Vitals: BP 145/94 mmHg  Pulse 115  Temp(Src) 98.5 F (36.9 C) (Oral)  Resp 20  Ht 5' (1.524 m)  Wt 198 lb (89.812 kg)  BMI 38.67 kg/m2  SpO2 99%  LMP 05/13/2015   Physical Exam  Constitutional: She is oriented to person, place, and time. She appears well-developed and well-nourished.  HENT:  Head: Normocephalic.  Mouth/Throat: Oropharynx is clear and moist.  Eyes: EOM are normal.  Neck: Normal range of motion.  Cardiovascular: Normal rate, regular rhythm and normal heart sounds.   Pulmonary/Chest: Effort normal.  Abdominal: She exhibits no distension. There is tenderness.  Abdomen is diffusely tender  Musculoskeletal: Normal range of motion.  Neurological: She is alert and oriented to person, place, and time.  Psychiatric: She has a normal mood and affect.  Nursing note and vitals reviewed.   ED Course  Procedures (including critical care time)  DIAGNOSTIC STUDIES: Oxygen Saturation is 99% on RA, Normal by my interpretation.    COORDINATION OF CARE: 9:52 PM-Discussed treatment plan with pt at bedside and pt agreed to plan.     Labs Review Labs Reviewed - No data to display  Imaging Review No results found. I have personally reviewed and evaluated these images and lab results as part of my medical decision-making.   EKG Interpretation None      MDM   Final diagnoses:  None    Patient is a 44 year old female with chronic gastroparesis and nausea vomiting who is well-known to this emergency Department. She's presenting today with nausea starting today. Patient is being treated actively for C. difficile. However she was unable to take her vancomycin at home because of nausea and vomiting.  Patient tried taking Zofran but it didn't work. She reports  that Phenergan usually works for her. We will give her 1 L fluid check her CBC and Chem-7. We will give her Phenergan and then have her return home to take her by mouth medications as usual.  This seems similar to her usual exacerbation of chronic nausea vomiting.  Abdomen is soft, non-concerning.   I personally performed the services described in this documentation, which was scribed in my presence. The recorded information has been reviewed and is accurate.   Zian Delair Randall An, MD 05/26/15 2217

## 2015-05-26 NOTE — ED Notes (Signed)
Pt discharged to home

## 2015-05-26 NOTE — ED Notes (Signed)
Patient complaining of vomiting, abdominal pain, and diarrhea x 1 month. States she is currently being treated for c-diff. States tomorrow will be last dose of vancomycin. States symptoms are no better.

## 2015-05-26 NOTE — Discharge Instructions (Signed)

## 2015-05-27 NOTE — Telephone Encounter (Signed)
Tried to call pt. Many rings and no answer.  

## 2015-05-27 NOTE — Telephone Encounter (Signed)
Pt is aware. She said the pharmacy had to order it and it will be there today and she will begin.

## 2015-05-29 ENCOUNTER — Telehealth: Payer: Self-pay

## 2015-05-29 DIAGNOSIS — R197 Diarrhea, unspecified: Secondary | ICD-10-CM

## 2015-05-29 NOTE — Telephone Encounter (Signed)
Pt called and said she is having a lot of diarrhea, 11 times since last night and it is a lot each time. She will finish Vancomycin today. Please advise! Routing to Dr. Darrick Penna since Tobi Bastos is away this afternoon.

## 2015-05-31 ENCOUNTER — Other Ambulatory Visit: Payer: Self-pay

## 2015-05-31 DIAGNOSIS — R197 Diarrhea, unspecified: Secondary | ICD-10-CM

## 2015-05-31 NOTE — Telephone Encounter (Signed)
Pt aware, lab order done and pt will go by the lab to pick up the container.

## 2015-05-31 NOTE — Telephone Encounter (Signed)
Routing to [place] in Dr. Darrick Penna absence today.

## 2015-05-31 NOTE — Telephone Encounter (Signed)
Repeating a stool for PCR is not recommended to document eradication, as it could be positive up to 6 weeks after treatment. HOWEVER, she is persistently having loose stool, and IF it is negative, we will have a direction in which to go and need to start thinking of other underlying issues (post-infectious IBS, possible need for colonoscopy if no improvement to assess for presence of pseudomembranes or other etiology). UP to 50% of patients who have stool studies done after completion of therapy may have a positive result. If Cdiff was truly not eradicated, she has a refractory disease. May need to consider fidoxamicin, but insurance is going to be a huge issue.   For now: check Cdiff PCR. I know there is a chance this will still be positive and may be a false positive, BUT if it is negative, it can help guide the next step.

## 2015-05-31 NOTE — Telephone Encounter (Signed)
Pt is taking the Questran. She finished the Vanc yesterday. She said her left side is swollen some and she continues to have numerous watery stools. Please advise!

## 2015-05-31 NOTE — Telephone Encounter (Signed)
Is she taking questran?  What taper part is she on for vanc?

## 2015-06-01 ENCOUNTER — Encounter (HOSPITAL_COMMUNITY): Payer: Self-pay | Admitting: Emergency Medicine

## 2015-06-01 ENCOUNTER — Emergency Department (HOSPITAL_COMMUNITY)
Admission: EM | Admit: 2015-06-01 | Discharge: 2015-06-01 | Disposition: A | Discharge status: Home / Self Care | Payer: Medicare Other | Attending: Emergency Medicine | Admitting: Emergency Medicine

## 2015-06-01 DIAGNOSIS — E119 Type 2 diabetes mellitus without complications: Secondary | ICD-10-CM | POA: Diagnosis not present

## 2015-06-01 DIAGNOSIS — R197 Diarrhea, unspecified: Secondary | ICD-10-CM | POA: Diagnosis not present

## 2015-06-01 DIAGNOSIS — Z8619 Personal history of other infectious and parasitic diseases: Secondary | ICD-10-CM | POA: Insufficient documentation

## 2015-06-01 DIAGNOSIS — Z880 Allergy status to penicillin: Secondary | ICD-10-CM | POA: Diagnosis not present

## 2015-06-01 DIAGNOSIS — I10 Essential (primary) hypertension: Secondary | ICD-10-CM | POA: Insufficient documentation

## 2015-06-01 DIAGNOSIS — R1032 Left lower quadrant pain: Secondary | ICD-10-CM

## 2015-06-01 DIAGNOSIS — M797 Fibromyalgia: Secondary | ICD-10-CM | POA: Diagnosis not present

## 2015-06-01 DIAGNOSIS — N390 Urinary tract infection, site not specified: Secondary | ICD-10-CM | POA: Diagnosis not present

## 2015-06-01 DIAGNOSIS — M069 Rheumatoid arthritis, unspecified: Secondary | ICD-10-CM | POA: Insufficient documentation

## 2015-06-01 DIAGNOSIS — Z79899 Other long term (current) drug therapy: Secondary | ICD-10-CM | POA: Insufficient documentation

## 2015-06-01 DIAGNOSIS — G8929 Other chronic pain: Secondary | ICD-10-CM | POA: Insufficient documentation

## 2015-06-01 LAB — URINALYSIS, ROUTINE W REFLEX MICROSCOPIC
Bilirubin Urine: NEGATIVE
Glucose, UA: NEGATIVE mg/dL
KETONES UR: NEGATIVE mg/dL
Nitrite: NEGATIVE
Specific Gravity, Urine: >1.03 — ABNORMAL HIGH (ref 1.005–1.030)
UROBILINOGEN UA: 0.2 mg/dL (ref 0.0–1.0)
pH: 6 (ref 5.0–8.0)

## 2015-06-01 LAB — I-STAT CHEM 8, ED
BUN: 7 mg/dL (ref 6–20)
CHLORIDE: 101 mmol/L (ref 101–111)
Calcium, Ion: 1.18 mmol/L (ref 1.12–1.23)
Creatinine, Ser: 0.7 mg/dL (ref 0.44–1.00)
Glucose, Bld: 96 mg/dL (ref 65–99)
HEMATOCRIT: 40 % (ref 36.0–46.0)
Hemoglobin: 13.6 g/dL (ref 12.0–15.0)
Potassium: 3.6 mmol/L (ref 3.5–5.1)
SODIUM: 137 mmol/L (ref 135–145)
TCO2: 24 mmol/L (ref 0–100)

## 2015-06-01 LAB — URINE MICROSCOPIC-ADD ON

## 2015-06-01 LAB — I-STAT BETA HCG BLOOD, ED (MC, WL, AP ONLY): I-stat hCG, quantitative: <5 m[IU]/mL (ref <5)

## 2015-06-01 MED ORDER — ACETAMINOPHEN 325 MG PO TABS
650.0000 mg | ORAL_TABLET | Freq: Once | ORAL | Status: AC
  Administered 2015-06-01: 650 mg via ORAL
  Filled 2015-06-01: qty 2

## 2015-06-01 MED ORDER — CIPROFLOXACIN HCL 500 MG PO TABS: 500.0000 mg | ORAL_TABLET | Freq: Two times a day (BID) | ORAL | Status: DC

## 2015-06-01 MED ORDER — ONDANSETRON 8 MG PO TBDP
8.0000 mg | ORAL_TABLET | Freq: Once | ORAL | Status: AC
  Administered 2015-06-01: 8 mg via ORAL
  Filled 2015-06-01: qty 1

## 2015-06-01 NOTE — ED Notes (Signed)
Patient made aware a urine sample is needed at this time. 

## 2015-06-01 NOTE — ED Provider Notes (Signed)
CSN: [id number]     Arrival date & time 06/01/15  1347 History   First MD Initiated Contact with Patient 06/01/15 1353     Chief Complaint  Patient presents with  . Abdominal Pain     Patient is a 44 y.o. female presenting with abdominal pain. The history is provided by the patient.  Abdominal Pain Pain location:  LLQ Pain quality: aching   Pain severity:  Moderate Onset quality:  Gradual Duration:  1 day Timing:  Constant Progression:  Worsening Chronicity:  Recurrent Relieved by:  Nothing Worsened by:  Movement and palpation Associated symptoms: diarrhea, nausea and vomiting   Associated symptoms: no dysuria, no fever, no hematemesis, no hematochezia and no vaginal bleeding   Pt presents with vomiting/diarrhea She reports she just completed therapy for cdif She reports her diarrhea worsened over past 24 hours She reports pain in LLQ with "swelling" in that region She called her GI physician and they requested to have be re-tested for cdif   Past Medical History  Diagnosis Date  . Hypertension   . Chronic back pain   . Sciatic pain     right  . Rib pain on right side     chronic  . Chronic neck pain   . Cervical radiculopathy   . Chronic knee pain   . Rheumatoid arthritis     "Rhematoid factor positive but no symptoms" per EPIC notes 2012  . Fibromyalgia   . Diabetes mellitus without complication   . C. difficile diarrhea    Past Surgical History  Procedure Laterality Date  . Tubal ligation    . Bladder surgery     Family History  Problem Relation Age of Onset  . Cancer Mother   . Diabetes Mother   . Colon cancer Father     AFTER AGE 53  . Crohn's disease Sister    Social History  Substance Use Topics  . Smoking status: Never Smoker   . Smokeless tobacco: Never Used  . Alcohol Use: No   OB History    Gravida Para Term Preterm AB TAB SAB Ectopic Multiple Living   3 3 3       3      Review of Systems  Constitutional: Negative for fever.   Gastrointestinal: Positive for nausea, vomiting, abdominal pain and diarrhea. Negative for hematochezia and hematemesis.  Genitourinary: Negative for dysuria and vaginal bleeding.  All other systems reviewed and are negative.     Allergies  Mango flavor; Penicillins; Tramadol; Bactrim; Keflex; and Toradol  Home Medications   Prior to Admission medications   Medication Sig Start Date End Date Taking? Authorizing Provider  cholestyramine (QUESTRAN) 4 G packet Take 0.5 packets (2 g total) by mouth daily. 05/23/15   05/25/15, NP  dicyclomine (BENTYL) 20 MG tablet Take 1 tablet (20 mg total) by mouth 2 (two) times daily. Patient not taking: Reported on 05/26/2015 05/21/15   05/23/15, MD  diphenoxylate-atropine (LOMOTIL) 2.5-0.025 MG per tablet Take 2 tablets by mouth 4 (four) times daily as needed for diarrhea or loose stools. Patient not taking: Reported on 05/09/2015 05/04/15   05/06/15, MD  HYDROcodone-acetaminophen (NORCO/VICODIN) 5-325 MG per tablet 1 OR 2 EVERY 4-6 H PRN FOR PAIN Patient not taking: Reported on 05/26/2015 05/23/15   05/25/15, NP  lisinopril-hydrochlorothiazide (PRINZIDE,ZESTORETIC) 20-25 MG per tablet Take 2 tablets by mouth every morning.     Historical Provider, MD  ondansetron (ZOFRAN) 4 MG tablet Take 1  tablet (4 mg total) by mouth every 6 (six) hours as needed for nausea or vomiting. Patient not taking: Reported on 05/21/2015 05/09/15   West Bali, MD  oxyCODONE-acetaminophen (PERCOCET) 5-325 MG per tablet Take 1-2 tablets by mouth every 4 (four) hours as needed. Patient not taking: Reported on 05/09/2015 05/04/15   Gilda Crease, MD  promethazine (PHENERGAN) 25 MG tablet Take 1 tablet (25 mg total) by mouth every 6 (six) hours as needed for nausea or vomiting. Patient not taking: Reported on 05/21/2015 05/04/15   Gilda Crease, MD  promethazine (PHENERGAN) 25 MG tablet Take 1 tablet (25 mg total) by mouth every 6 (six) hours as needed  for nausea or vomiting. 05/26/15   Courteney Lyn Mackuen, MD  vancomycin (VANCOCIN) 250 MG capsule 1 PO Q6H FOR 7 DAYS THEN PO Q8H FOR 7 DAYS THEN PO Q12H FOR 7 DAYS THEN PO QD FOR 7 DAYS 05/15/15   West Bali, MD  venlafaxine XR (EFFEXOR-XR) 75 MG 24 hr capsule Take 225 mg by mouth at bedtime.     Historical Provider, MD   BP 122/79 mmHg  Pulse 93  Temp(Src) 98.2 F (36.8 C) (Oral)  Resp 16  Ht 5' (1.524 m)  Wt 198 lb (89.812 kg)  BMI 38.67 kg/m2  SpO2 98%  LMP 05/13/2015 Physical Exam CONSTITUTIONAL: Well developed/well nourished HEAD: Normocephalic/atraumatic EYES: EOMI/PERRL ENMT: Mucous membranes moist NECK: supple no meningeal signs SPINE/BACK:entire spine nontender CV: S1/S2 noted, no murmurs/rubs/gallops noted LUNGS: Lungs are clear to auscultation bilaterally, no apparent distress ABDOMEN: soft, nontender, no rebound or guarding, bowel sounds noted throughout abdomen GU:no cva tenderness NEURO: Pt is awake/alert/appropriate, moves all extremitiesx4.  No facial droop.   EXTREMITIES: pulses normal/equal, full ROM SKIN: warm, color normal PSYCH: no abnormalities of mood noted, alert and oriented to situation  ED Course  Procedures  3:14 PM  Pt reports return of diarrhea and also LLQ abd pain/swelling I don't see any visible swelling to abd wall No signs of hernia Pt is not toxic in appearance She has had no stool output as of yet Labs reassuring despite recent reports 20 bouts of diarrhea GI specialist would like to have repeat cdif studies, but if no stool output here this can be deferred to outpatient workup Pt has had multiple CT abd/pelvis scans in recent past (21 ER visits in 6 months) Defer further workup/imaging at this time   Labs Review Labs Reviewed  C DIFFICILE QUICK SCREEN W PCR REFLEX  URINALYSIS, ROUTINE W REFLEX MICROSCOPIC (NOT AT [facility])  I-STAT CHEM 8, ED  I-STAT BETA HCG BLOOD, ED ([facility], [facility], [facility] ONLY)   I have personally reviewed and evaluated  these lab results as part of my medical decision-making.   Medications  ondansetron (ZOFRAN-ODT) disintegrating tablet 8 mg (8 mg Oral Given 06/01/15 1444)  acetaminophen (TYLENOL) tablet 650 mg (650 mg Oral Given 06/01/15 1444)    MDM   Final diagnoses:  LLQ abdominal pain  Diarrhea    Nursing notes including past medical history and social history reviewed and considered in documentation Labs/vital reviewed myself and considered during evaluation     Zadie Rhine, MD 06/01/15 [phone]

## 2015-06-01 NOTE — ED Notes (Signed)
Patient states she has vomited 4-5 time in the past 24 hours, and has had 20 bouts of watery diarrhea inf past 24 hours. Patient states has been diagnosed with C-diff 2 months. Was treated with Vancomycin and finished it 2 days ago. States pain in left lower abdomen that started last night.

## 2015-06-01 NOTE — ED Notes (Signed)
Patient has not had any bowel movement's while in ER.

## 2015-06-01 NOTE — ED Notes (Signed)
Waiting on UA results before discharging patient.

## 2015-06-01 NOTE — Discharge Instructions (Signed)
°  SEEK IMMEDIATE MEDICAL ATTENTION IF:  A temperature above 100.53F develops.  Repeated vomiting occurs (multiple episodes).  The pain becomes localized to portions of the abdomen. The right side could possibly be appendicitis. In an adult, the left lower portion of the abdomen could be colitis or diverticulitis.  Blood is being passed in stools or vomit (bright red or black tarry stools).  Return also if you develop chest pain, difficulty breathing, dizziness or fainting, or become confused, poorly responsive, or inconsolable.  Additionally,  You have a urinary tract infection.  Antibiotic for same.  Increase fluids

## 2015-06-01 NOTE — ED Notes (Signed)
Patient prescription and d/c papers given and reviewed. Questions answered. Patient verbalized understanding.

## 2015-06-04 LAB — CLOSTRIDIUM DIFFICILE BY PCR

## 2015-06-04 MED ORDER — HYOSCYAMINE SULFATE 0.125 MG SL SUBL
0.1250 mg | SUBLINGUAL_TABLET | SUBLINGUAL | Status: DC | PRN
Start: 2015-06-04 — End: 2015-10-12

## 2015-06-04 NOTE — Telephone Encounter (Signed)
Cdiff was unable to be resulted BECAUSE IT WAS NOT LOOSE STOOL.   When she says she is having diarrhea, is it profuse/liquid stool OR is it like soft serve?   I am sending in Levsin to take every 4 hours as needed for abdominal cramping. DO NOT TAKE BENTYL with it. Looks like she never filled it. 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 If she still has liquid stools, could submit an additional sample BUT STOOL NEEDS TO BE LIQUID if checking Cdiff.

## 2015-06-04 NOTE — Telephone Encounter (Signed)
Pt called today and asked about her C-Diff result. Said she has had diarrhea about 5 times today, it is some better. She is complaining with LLQ pain and it is constant and she rates the pain at a 9 at the present time. ( See ED notes from the week end).

## 2015-06-04 NOTE — Telephone Encounter (Signed)
Pt said she is NOT having watery stool, it is soft. She is aware to stop Bentyl and start the Levsin.

## 2015-06-05 NOTE — Telephone Encounter (Signed)
Pt said she is taking the Levsin every 4 hours and still having bad abdominal cramps. She has never drank alcohol and not aware if she has every had pancreatitis.

## 2015-06-05 NOTE — Telephone Encounter (Signed)
Patient called in and stated she started the Levsin yesterday around 4pm and her stomach is still cramping accompanied with diarrhea.  Routing to [place] for advice

## 2015-06-05 NOTE — Telephone Encounter (Signed)
Take every 4 hours.   Does she have a history of alcohol use or pancreatitis? 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

## 2015-06-06 MED ORDER — ELUXADOLINE 100 MG PO TABS: 100.0000 mg | ORAL_TABLET | Freq: Two times a day (BID) | ORAL | Status: DC

## 2015-06-06 NOTE — Addendum Note (Signed)
Addended by: West Bali on: 06/06/2015 01:43 PM   Modules accepted: Orders

## 2015-06-06 NOTE — Telephone Encounter (Addendum)
PLEASE CALL PT. HER STOOL COULD NOT BE PROCESSED BECAUSE IT WAS  LOOSE STOOL NOT DIARRHEA. SHE SHOULD START VIBERZI TWICE DAILY. IT WILL HELP WITH ABDOMINAL PAIN AND LOOSE STOOLS. SHE CAN PICK UP PRESCRIPTION TODAY.   TAKE A PROBIOTIC DAILY FOR 3 MOS ([place] [facility] [employer], [employer], ALIGN).   STOP LEVSIN. ONLY USE IMODIUM 1 OR 2 TIMES A DAY IF NEEDED FOR ADDITIONAL RELIEF FROM ABDOMINAL PAIN OR DIARRHEA..  DO NOT USE LOTRENEX, LOMOTIL, LEVSIN, OR BENTYL WITH VIBERZI.  YOU MUST CALL IF YOU GO MORE THAN 4 DAYS WITHOUT A BOWEL MOVEMENT OR YOU DEVELOP INCREASE IN NAUSEA, VOMITING, OR ABDOMINAL PAIN.  FOLLOW UP IN 2 MOS E30 ABD PAIN/DIARRHEA.

## 2015-06-06 NOTE — Telephone Encounter (Signed)
APPOINTMENT MADE AND APPT CARD ON SAMPLE BAG

## 2015-06-06 NOTE — Telephone Encounter (Signed)
Called and informed pt. She will come by and pick up the prescription and I am giving her samples of the Viberzi 100 mg #16 to take one twice daily.  I went over all of the info and printed it off for her also.  Routing to [place] to make appt in 2 months.

## 2015-08-05 ENCOUNTER — Encounter (INDEPENDENT_AMBULATORY_CARE_PROVIDER_SITE_OTHER): Payer: Self-pay | Admitting: Gastroenterology

## 2015-08-05 ENCOUNTER — Emergency Department (HOSPITAL_COMMUNITY)
Admission: EM | Admit: 2015-08-05 | Discharge: 2015-08-05 | Disposition: A | Discharge status: Home / Self Care | Payer: Medicare Other | Attending: Emergency Medicine | Admitting: Emergency Medicine

## 2015-08-05 ENCOUNTER — Telehealth: Payer: Self-pay | Admitting: Gastroenterology

## 2015-08-05 ENCOUNTER — Encounter (HOSPITAL_COMMUNITY): Payer: Self-pay | Admitting: Emergency Medicine

## 2015-08-05 ENCOUNTER — Encounter: Payer: Self-pay | Admitting: Gastroenterology

## 2015-08-05 DIAGNOSIS — E119 Type 2 diabetes mellitus without complications: Secondary | ICD-10-CM | POA: Insufficient documentation

## 2015-08-05 DIAGNOSIS — M797 Fibromyalgia: Secondary | ICD-10-CM | POA: Diagnosis not present

## 2015-08-05 DIAGNOSIS — Z79899 Other long term (current) drug therapy: Secondary | ICD-10-CM | POA: Diagnosis not present

## 2015-08-05 DIAGNOSIS — R197 Diarrhea, unspecified: Secondary | ICD-10-CM | POA: Insufficient documentation

## 2015-08-05 DIAGNOSIS — Z792 Long term (current) use of antibiotics: Secondary | ICD-10-CM | POA: Diagnosis not present

## 2015-08-05 DIAGNOSIS — R1031 Right lower quadrant pain: Secondary | ICD-10-CM | POA: Diagnosis present

## 2015-08-05 DIAGNOSIS — G8929 Other chronic pain: Secondary | ICD-10-CM | POA: Insufficient documentation

## 2015-08-05 DIAGNOSIS — Z880 Allergy status to penicillin: Secondary | ICD-10-CM | POA: Insufficient documentation

## 2015-08-05 DIAGNOSIS — M069 Rheumatoid arthritis, unspecified: Secondary | ICD-10-CM | POA: Diagnosis not present

## 2015-08-05 DIAGNOSIS — Z8619 Personal history of other infectious and parasitic diseases: Secondary | ICD-10-CM | POA: Diagnosis not present

## 2015-08-05 DIAGNOSIS — N390 Urinary tract infection, site not specified: Secondary | ICD-10-CM | POA: Insufficient documentation

## 2015-08-05 DIAGNOSIS — I10 Essential (primary) hypertension: Secondary | ICD-10-CM | POA: Diagnosis not present

## 2015-08-05 LAB — COMPREHENSIVE METABOLIC PANEL
ALK PHOS: 32 U/L — AB (ref 38–126)
ALT: 15 U/L (ref 14–54)
AST: 20 U/L (ref 15–41)
Albumin: 4.1 g/dL (ref 3.5–5.0)
Anion gap: 10 (ref 5–15)
BUN: 13 mg/dL (ref 6–20)
CALCIUM: 9.3 mg/dL (ref 8.9–10.3)
CO2: 26 mmol/L (ref 22–32)
CREATININE: 0.77 mg/dL (ref 0.44–1.00)
Chloride: 101 mmol/L (ref 101–111)
Glucose, Bld: 116 mg/dL — ABNORMAL HIGH (ref 65–99)
Potassium: 3.9 mmol/L (ref 3.5–5.1)
Sodium: 137 mmol/L (ref 135–145)
Total Bilirubin: 0.5 mg/dL (ref 0.3–1.2)
Total Protein: 7.8 g/dL (ref 6.5–8.1)

## 2015-08-05 LAB — CBC WITH DIFFERENTIAL/PLATELET
BASOS PCT: 0 %
Basophils Absolute: 0 10*3/uL (ref 0.0–0.1)
EOS ABS: 0.1 10*3/uL (ref 0.0–0.7)
Eosinophils Relative: 2 %
HEMATOCRIT: 39.1 % (ref 36.0–46.0)
HEMOGLOBIN: 13.7 g/dL (ref 12.0–15.0)
LYMPHS ABS: 2.9 10*3/uL (ref 0.7–4.0)
Lymphocytes Relative: 36 %
MCH: 30 pg (ref 26.0–34.0)
MCHC: 35 g/dL (ref 30.0–36.0)
MCV: 85.7 fL (ref 78.0–100.0)
MONOS PCT: 5 %
Monocytes Absolute: 0.4 10*3/uL (ref 0.1–1.0)
NEUTROS ABS: 4.7 10*3/uL (ref 1.7–7.7)
NEUTROS PCT: 57 %
Platelets: 311 10*3/uL (ref 150–400)
RBC: 4.56 MIL/uL (ref 3.87–5.11)
RDW: 13.1 % (ref 11.5–15.5)
WBC: 8.2 10*3/uL (ref 4.0–10.5)

## 2015-08-05 LAB — URINALYSIS, ROUTINE W REFLEX MICROSCOPIC
BILIRUBIN URINE: NEGATIVE
Glucose, UA: NEGATIVE mg/dL
Ketones, ur: NEGATIVE mg/dL
NITRITE: NEGATIVE
Protein, ur: NEGATIVE mg/dL
SPECIFIC GRAVITY, URINE: 1.025 (ref 1.005–1.030)
pH: 6 (ref 5.0–8.0)

## 2015-08-05 LAB — AMYLASE: Amylase: 56 U/L (ref 28–100)

## 2015-08-05 LAB — LIPASE, BLOOD: LIPASE: 44 U/L (ref 11–51)

## 2015-08-05 LAB — URINE MICROSCOPIC-ADD ON

## 2015-08-05 MED ORDER — HYDROCODONE-ACETAMINOPHEN 5-325 MG PO TABS: 1.0000 | ORAL_TABLET | Freq: Four times a day (QID) | ORAL | Status: DC | PRN

## 2015-08-05 MED ORDER — CIPROFLOXACIN HCL 500 MG PO TABS: 500.0000 mg | ORAL_TABLET | Freq: Two times a day (BID) | ORAL | Status: DC

## 2015-08-05 NOTE — Telephone Encounter (Signed)
PATIENT WAS A NO SHOW AND LETTER SENT  °

## 2015-08-05 NOTE — Progress Notes (Signed)
   Subjective:    Patient ID: Regina Patton, female    DOB: [birth year], 44 y.o.   MRN: [id number]  No PCP Per Patient  HPI  No SHOW/NO CALL  PT DENIES FEVER, CHILLS, HEMATOCHEZIA, HEMATEMESIS, nausea, vomiting, melena, diarrhea, CHEST PAIN, SHORTNESS OF BREATH,  CHANGE IN BOWEL IN HABITS, constipation, abdominal pain, problems swallowing, problems with sedation, heartburn or indigestion.   Past Medical History  Diagnosis Date  . Hypertension   . Chronic back pain   . Sciatic pain     right  . Rib pain on right side     chronic  . Chronic neck pain   . Cervical radiculopathy   . Chronic knee pain   . Rheumatoid arthritis     "Rhematoid factor positive but no symptoms" per EPIC notes 2012  . Fibromyalgia   . Diabetes mellitus without complication   . C. difficile diarrhea     Past Surgical History  Procedure Laterality Date  . Tubal ligation    . Bladder surgery     Allergies  Allergen Reactions  . Mango Flavor Anaphylaxis and Swelling    Lips swelling  . Penicillins Anaphylaxis and Hives  . Tramadol Nausea And Vomiting  . Bactrim [Sulfamethoxazole-Trimethoprim] Itching and Rash  . Keflex [Cephalexin] Hives, Itching and Rash  . Toradol [Ketorolac Tromethamine] Rash    Review of Systems PER HPI OTHERWISE ALL SYSTEMS ARE NEGATIVE.    Objective:   Physical Exam        Assessment & Plan:

## 2015-08-05 NOTE — Discharge Instructions (Signed)
Cipro as prescribed.  Hydrocodone as prescribed as needed for pain.  Return to the emergency department for worsening pain, high fevers, bloody stools, or other new and concerning symptoms.   Urinary Tract Infection Urinary tract infections (UTIs) can develop anywhere along your urinary tract. Your urinary tract is your body's drainage system for removing wastes and extra water. Your urinary tract includes two kidneys, two ureters, a bladder, and a urethra. Your kidneys are a pair of bean-shaped organs. Each kidney is about the size of your fist. They are located below your ribs, one on each side of your spine. CAUSES Infections are caused by microbes, which are microscopic organisms, including fungi, viruses, and bacteria. These organisms are so small that they can only be seen through a microscope. Bacteria are the microbes that most commonly cause UTIs. SYMPTOMS  Symptoms of UTIs may vary by age and gender of the patient and by the location of the infection. Symptoms in young women typically include a frequent and intense urge to urinate and a painful, burning feeling in the bladder or urethra during urination. Older women and men are more likely to be tired, shaky, and weak and have muscle aches and abdominal pain. A fever may mean the infection is in your kidneys. Other symptoms of a kidney infection include pain in your back or sides below the ribs, nausea, and vomiting. DIAGNOSIS To diagnose a UTI, your caregiver will ask you about your symptoms. Your caregiver will also ask you to provide a urine sample. The urine sample will be tested for bacteria and white blood cells. White blood cells are made by your body to help fight infection. TREATMENT  Typically, UTIs can be treated with medication. Because most UTIs are caused by a bacterial infection, they usually can be treated with the use of antibiotics. The choice of antibiotic and length of treatment depend on your symptoms and the type of  bacteria causing your infection. HOME CARE INSTRUCTIONS  If you were prescribed antibiotics, take them exactly as your caregiver instructs you. Finish the medication even if you feel better after you have only taken some of the medication.  Drink enough water and fluids to keep your urine clear or pale yellow.  Avoid caffeine, tea, and carbonated beverages. They tend to irritate your bladder.  Empty your bladder often. Avoid holding urine for long periods of time.  Empty your bladder before and after sexual intercourse.  After a bowel movement, women should cleanse from front to back. Use each tissue only once. SEEK MEDICAL CARE IF:   You have back pain.  You develop a fever.  Your symptoms do not begin to resolve within 3 days. SEEK IMMEDIATE MEDICAL CARE IF:   You have severe back pain or lower abdominal pain.  You develop chills.  You have nausea or vomiting.  You have continued burning or discomfort with urination. MAKE SURE YOU:   Understand these instructions.  Will watch your condition.  Will get help right away if you are not doing well or get worse.   This information is not intended to replace advice given to you by your health care provider. Make sure you discuss any questions you have with your health care provider.   Document Released: 06/03/2005 Document Revised: 05/15/2015 Document Reviewed: 10/02/2011 Elsevier Interactive Patient Education [employer]

## 2015-08-05 NOTE — Telephone Encounter (Signed)
REVIEWED-NO ADDITIONAL RECOMMENDATIONS. 

## 2015-08-05 NOTE — ED Notes (Signed)
Pt states that she has been having right side pain, upper and lower, since last Wednesday with a little diarrhea.  Two stools in last 24 hours.

## 2015-08-05 NOTE — ED Provider Notes (Signed)
CSN: [id number]     Arrival date & time 08/05/15  1200 History   First MD Initiated Contact with Patient 08/05/15 1326     Chief Complaint  Patient presents with  . Abdominal Pain     (Consider location/radiation/quality/duration/timing/severity/associated sxs/prior Treatment) HPI Comments: Patient is a 44 year old female with history of chronic abdominal pain and recurrent C. difficile colitis. She presents for evaluation of right-sided upper abdominal and flank pain for the past 5 days. She denies any vomiting but does report 2 episodes of loose stool. She denies any blood. She denies any urinary complaints.  Patient is a 44 y.o. female presenting with abdominal pain. The history is provided by the patient.  Abdominal Pain Pain location:  R flank and RLQ Pain quality: cramping   Pain radiates to:  Does not radiate Pain severity:  Moderate Onset quality:  Gradual Duration:  5 days Timing:  Constant Progression:  Worsening Chronicity:  New Relieved by:  Nothing Worsened by:  Nothing tried   Past Medical History  Diagnosis Date  . Hypertension   . Chronic back pain   . Sciatic pain     right  . Rib pain on right side     chronic  . Chronic neck pain   . Cervical radiculopathy   . Chronic knee pain   . Rheumatoid arthritis (HCC)     "Rhematoid factor positive but no symptoms" per EPIC notes 2012  . Fibromyalgia   . Diabetes mellitus without complication (HCC)   . C. difficile diarrhea    Past Surgical History  Procedure Laterality Date  . Tubal ligation    . Bladder surgery     Family History  Problem Relation Age of Onset  . Cancer Mother   . Diabetes Mother   . Colon cancer Father     AFTER AGE 16  . Crohn's disease Sister    Social History  Substance Use Topics  . Smoking status: Never Smoker   . Smokeless tobacco: Never Used  . Alcohol Use: No   OB History    Gravida Para Term Preterm AB TAB SAB Ectopic Multiple Living   3 3 3       3      Review  of Systems  Gastrointestinal: Positive for abdominal pain.  All other systems reviewed and are negative.     Allergies  Mango flavor; Penicillins; Tramadol; Bactrim; Keflex; and Toradol  Home Medications   Prior to Admission medications   Medication Sig Start Date End Date Taking? Authorizing Provider  cholestyramine (QUESTRAN) 4 G packet Take 0.5 packets (2 g total) by mouth daily. 05/23/15   05/25/15, NP  ciprofloxacin (CIPRO) 500 MG tablet Take 1 tablet (500 mg total) by mouth 2 (two) times daily. 06/01/15   06/03/15, MD  dicyclomine (BENTYL) 20 MG tablet Take 1 tablet (20 mg total) by mouth 2 (two) times daily. Patient not taking: Reported on 05/26/2015 05/21/15   05/23/15, MD  diphenoxylate-atropine (LOMOTIL) 2.5-0.025 MG per tablet Take 2 tablets by mouth 4 (four) times daily as needed for diarrhea or loose stools. Patient not taking: Reported on 05/09/2015 05/04/15   05/06/15, MD  Eluxadoline (VIBERZI) 100 MG TABS Take 100 mg by mouth 2 (two) times daily. 06/06/15   06/08/15, MD  HYDROcodone-acetaminophen (NORCO/VICODIN) 5-325 MG per tablet 1 OR 2 EVERY 4-6 H PRN FOR PAIN Patient not taking: Reported on 05/26/2015 05/23/15   05/25/15, NP  hyoscyamine (LEVSIN  SL) 0.125 MG SL tablet Place 1 tablet (0.125 mg total) under the tongue every 4 (four) hours as needed. 06/04/15   Nira Retort, NP  lisinopril-hydrochlorothiazide (PRINZIDE,ZESTORETIC) 20-25 MG per tablet Take 2 tablets by mouth every morning.     Historical Provider, MD  ondansetron (ZOFRAN) 4 MG tablet Take 1 tablet (4 mg total) by mouth every 6 (six) hours as needed for nausea or vomiting. Patient not taking: Reported on 05/21/2015 05/09/15   West Bali, MD  oxyCODONE-acetaminophen (PERCOCET) 5-325 MG per tablet Take 1-2 tablets by mouth every 4 (four) hours as needed. Patient not taking: Reported on 05/09/2015 05/04/15   Gilda Crease, MD  promethazine (PHENERGAN) 25 MG tablet Take 1 tablet (25 mg  total) by mouth every 6 (six) hours as needed for nausea or vomiting. Patient not taking: Reported on 05/21/2015 05/04/15   Gilda Crease, MD  promethazine (PHENERGAN) 25 MG tablet Take 1 tablet (25 mg total) by mouth every 6 (six) hours as needed for nausea or vomiting. 05/26/15   Courteney Lyn Mackuen, MD  vancomycin (VANCOCIN) 250 MG capsule 1 PO Q6H FOR 7 DAYS THEN PO Q8H FOR 7 DAYS THEN PO Q12H FOR 7 DAYS THEN PO QD FOR 7 DAYS 05/15/15   West Bali, MD  venlafaxine XR (EFFEXOR-XR) 75 MG 24 hr capsule Take 225 mg by mouth at bedtime.     Historical Provider, MD   BP 155/95 mmHg  Pulse 107  Temp(Src) 98 F (36.7 C) (Oral)  Resp 20  Ht 5' (1.524 m)  Wt 198 lb (89.812 kg)  BMI 38.67 kg/m2  SpO2 97%  LMP 07/29/2015 Physical Exam  Constitutional: She is oriented to person, place, and time. She appears well-developed and well-nourished. No distress.  HENT:  Head: Normocephalic and atraumatic.  Neck: Normal range of motion. Neck supple.  Cardiovascular: Normal rate and regular rhythm.  Exam reveals no gallop and no friction rub.   No murmur heard. Pulmonary/Chest: Effort normal and breath sounds normal. No respiratory distress. She has no wheezes.  Abdominal: Soft. Bowel sounds are normal. She exhibits no distension. There is tenderness.  There is tenderness to palpation in the right flank.  Musculoskeletal: Normal range of motion.  Neurological: She is alert and oriented to person, place, and time.  Skin: Skin is warm and dry. She is not diaphoretic.  Nursing note and vitals reviewed.   ED Course  Procedures (including critical care time) Labs Review Labs Reviewed  COMPREHENSIVE METABOLIC PANEL - Abnormal; Notable for the following:    Glucose, Bld 116 (*)    Alkaline Phosphatase 32 (*)    All other components within normal limits  URINALYSIS, ROUTINE W REFLEX MICROSCOPIC (NOT AT [facility]) - Abnormal; Notable for the following:    APPearance CLOUDY (*)    Hgb urine dipstick  MODERATE (*)    Leukocytes, UA MODERATE (*)    All other components within normal limits  URINE MICROSCOPIC-ADD ON - Abnormal; Notable for the following:    Squamous Epithelial / LPF 6-30 (*)    Bacteria, UA MANY (*)    All other components within normal limits  CBC WITH DIFFERENTIAL/PLATELET  LIPASE, BLOOD  AMYLASE    Imaging Review No results found. I have personally reviewed and evaluated these images and lab results as part of my medical decision-making.   EKG Interpretation None      MDM   Final diagnoses:  None    Patient's laboratory studies are reassuring. She  has some tenderness in the right flank and a urinalysis which is consistent with a urinary tract infection. This will be treated with Cipro and when necessary follow-up. I doubt there is any acute intra-abdominal process. There is no tenderness in her abdomen and her laboratory studies are unremarkable. If she worsens or does not improve she is to be followed up or return.    Geoffery Lyons, MD 08/05/15 [phone]

## 2015-08-11 ENCOUNTER — Emergency Department (HOSPITAL_COMMUNITY)
Admission: EM | Admit: 2015-08-11 | Discharge: 2015-08-11 | Disposition: A | Discharge status: Home / Self Care | Payer: Medicare Other | Attending: Emergency Medicine | Admitting: Emergency Medicine

## 2015-08-11 ENCOUNTER — Emergency Department (HOSPITAL_COMMUNITY)
Admission: EM | Admit: 2015-08-11 | Discharge: 2015-08-12 | Disposition: A | Discharge status: Home / Self Care | Payer: Medicare Other | Source: Home / Self Care | Attending: Emergency Medicine | Admitting: Emergency Medicine

## 2015-08-11 ENCOUNTER — Emergency Department (HOSPITAL_COMMUNITY): Payer: Medicare Other

## 2015-08-11 ENCOUNTER — Encounter (HOSPITAL_COMMUNITY): Payer: Self-pay | Admitting: Emergency Medicine

## 2015-08-11 ENCOUNTER — Encounter (HOSPITAL_COMMUNITY): Payer: Self-pay | Admitting: *Deleted

## 2015-08-11 DIAGNOSIS — Z8619 Personal history of other infectious and parasitic diseases: Secondary | ICD-10-CM | POA: Diagnosis not present

## 2015-08-11 DIAGNOSIS — M797 Fibromyalgia: Secondary | ICD-10-CM | POA: Insufficient documentation

## 2015-08-11 DIAGNOSIS — G8929 Other chronic pain: Secondary | ICD-10-CM | POA: Insufficient documentation

## 2015-08-11 DIAGNOSIS — S93401A Sprain of unspecified ligament of right ankle, initial encounter: Secondary | ICD-10-CM

## 2015-08-11 DIAGNOSIS — Z880 Allergy status to penicillin: Secondary | ICD-10-CM | POA: Diagnosis not present

## 2015-08-11 DIAGNOSIS — W108XXA Fall (on) (from) other stairs and steps, initial encounter: Secondary | ICD-10-CM | POA: Diagnosis not present

## 2015-08-11 DIAGNOSIS — Y998 Other external cause status: Secondary | ICD-10-CM | POA: Diagnosis not present

## 2015-08-11 DIAGNOSIS — Z79899 Other long term (current) drug therapy: Secondary | ICD-10-CM | POA: Insufficient documentation

## 2015-08-11 DIAGNOSIS — Y9389 Activity, other specified: Secondary | ICD-10-CM | POA: Insufficient documentation

## 2015-08-11 DIAGNOSIS — Y9289 Other specified places as the place of occurrence of the external cause: Secondary | ICD-10-CM | POA: Diagnosis not present

## 2015-08-11 DIAGNOSIS — I10 Essential (primary) hypertension: Secondary | ICD-10-CM | POA: Diagnosis not present

## 2015-08-11 DIAGNOSIS — E119 Type 2 diabetes mellitus without complications: Secondary | ICD-10-CM | POA: Diagnosis not present

## 2015-08-11 DIAGNOSIS — M5431 Sciatica, right side: Secondary | ICD-10-CM

## 2015-08-11 DIAGNOSIS — S99911A Unspecified injury of right ankle, initial encounter: Secondary | ICD-10-CM | POA: Diagnosis present

## 2015-08-11 MED ORDER — HYDROCODONE-ACETAMINOPHEN 5-325 MG PO TABS: 1.0000 | ORAL_TABLET | ORAL | Status: DC | PRN

## 2015-08-11 MED ORDER — NAPROXEN 250 MG PO TABS
500.0000 mg | ORAL_TABLET | Freq: Once | ORAL | Status: AC
  Administered 2015-08-12: 500 mg via ORAL
  Filled 2015-08-11: qty 2

## 2015-08-11 MED ORDER — NAPROXEN 250 MG PO TABS: 500.0000 mg | ORAL_TABLET | Freq: Once | ORAL | Status: DC

## 2015-08-11 MED ORDER — NAPROXEN 500 MG PO TABS: ORAL_TABLET | ORAL | Status: DC

## 2015-08-11 MED ORDER — HYDROCODONE-ACETAMINOPHEN 5-325 MG PO TABS
1.0000 | ORAL_TABLET | Freq: Once | ORAL | Status: AC
  Administered 2015-08-11: 1 via ORAL
  Filled 2015-08-11: qty 1

## 2015-08-11 MED ORDER — METHOCARBAMOL 500 MG PO TABS
1000.0000 mg | ORAL_TABLET | Freq: Once | ORAL | Status: AC
  Administered 2015-08-11: 1000 mg via ORAL
  Filled 2015-08-11: qty 2

## 2015-08-11 MED ORDER — PREDNISONE 50 MG PO TABS
60.0000 mg | ORAL_TABLET | Freq: Once | ORAL | Status: AC
  Administered 2015-08-11: 60 mg via ORAL
  Filled 2015-08-11: qty 1

## 2015-08-11 MED ORDER — METHOCARBAMOL 500 MG PO TABS: 1000.0000 mg | ORAL_TABLET | Freq: Four times a day (QID) | ORAL | Status: AC

## 2015-08-11 MED ORDER — PREDNISONE 10 MG PO TABS: ORAL_TABLET | ORAL | Status: DC

## 2015-08-11 NOTE — ED Notes (Signed)
Pt states spasms to right lower back with pain radiating down right leg.

## 2015-08-11 NOTE — Discharge Instructions (Signed)
Elevate your ankle, use ice packs. Take the naproxen for pain. Use the crutches until you are able to walk without them. Wear the ankle support for the next several weeks until your ankle is healed. You can be rechecked by the orthopedist on call, Dr Romeo Apple. Call his office if you need an appointment.    Ankle Sprain An ankle sprain is an injury to the strong, fibrous tissues (ligaments) that hold the bones of your ankle joint together.  CAUSES An ankle sprain is usually caused by a fall or by twisting your ankle. Ankle sprains most commonly occur when you step on the outer edge of your foot, and your ankle turns inward. People who participate in sports are more prone to these types of injuries.  SYMPTOMS   Pain in your ankle. The pain may be present at rest or only when you are trying to stand or walk.  Swelling.  Bruising. Bruising may develop immediately or within 1 to 2 days after your injury.  Difficulty standing or walking, particularly when turning corners or changing directions. DIAGNOSIS  Your caregiver will ask you details about your injury and perform a physical exam of your ankle to determine if you have an ankle sprain. During the physical exam, your caregiver will press on and apply pressure to specific areas of your foot and ankle. Your caregiver will try to move your ankle in certain ways. An X-ray exam may be done to be sure a bone was not broken or a ligament did not separate from one of the bones in your ankle (avulsion fracture).  TREATMENT  Certain types of braces can help stabilize your ankle. Your caregiver can make a recommendation for this. Your caregiver may recommend the use of medicine for pain. If your sprain is severe, your caregiver may refer you to a surgeon who helps to restore function to parts of your skeletal system (orthopedist) or a physical therapist. HOME CARE INSTRUCTIONS   Apply ice to your injury for 1-2 days or as directed by your caregiver.  Applying ice helps to reduce inflammation and pain.  Put ice in a plastic bag.  Place a towel between your skin and the bag.  Leave the ice on for 15-20 minutes at a time, every 2 hours while you are awake.  Only take over-the-counter or prescription medicines for pain, discomfort, or fever as directed by your caregiver.  Elevate your injured ankle above the level of your heart as much as possible for 2-3 days.  If your caregiver recommends crutches, use them as instructed. Gradually put weight on the affected ankle. Continue to use crutches or a cane until you can walk without feeling pain in your ankle.  If you have a plaster splint, wear the splint as directed by your caregiver. Do not rest it on anything harder than a pillow for the first 24 hours. Do not put weight on it. Do not get it wet. You may take it off to take a shower or bath.  You may have been given an elastic bandage to wear around your ankle to provide support. If the elastic bandage is too tight (you have numbness or tingling in your foot or your foot becomes cold and blue), adjust the bandage to make it comfortable.  If you have an air splint, you may blow more air into it or let air out to make it more comfortable. You may take your splint off at night and before taking a shower or bath. Wiggle  your toes in the splint several times per day to decrease swelling. SEEK MEDICAL CARE IF:   You have rapidly increasing bruising or swelling.  Your toes feel extremely cold or you lose feeling in your foot.  Your pain is not relieved with medicine. SEEK IMMEDIATE MEDICAL CARE IF:  Your toes are numb or blue.  You have severe pain that is increasing. MAKE SURE YOU:   Understand these instructions.  Will watch your condition.  Will get help right away if you are not doing well or get worse.   This information is not intended to replace advice given to you by your health care provider. Make sure you discuss any  questions you have with your health care provider.   Document Released: 08/24/2005 Document Revised: 09/14/2014 Document Reviewed: 09/05/2011 Elsevier Interactive Patient Education [employer]

## 2015-08-11 NOTE — ED Provider Notes (Signed)
CSN: [id number]     Arrival date & time 08/11/15  2212 History  By signing my name below, I, Regina Patton, attest that this documentation has been prepared under the direction and in the presence of [name], MD 23:15. Electronically Signed: Budd Patton, ED Scribe. 08/11/2015. 11:31 PM.     Chief Complaint  Patient presents with  . Ankle Pain   The history is provided by the patient. No language interpreter was used.   HPI Comments: Regina Patton is a 44 y.o. female with a PMHx of DM, HTN, sciatic pain, and rheumatoid arthritis who presents to the Emergency Department complaining of constant, aching lateral right ankle pain onset 2 hours ago. Pt states she was taking the dog out when her right leg "went numb," causing her to fall down missing the bottom two steps. She reports associated mild swelling to the right ankle. She states that her sciatica typically affects the right leg. She notes a PMHx of arthritis for which she is occasionally given steroids. She has never been on a biologic or had a rheumatologist. She also reports a FHx of rheumatoid arthritis. She denies drinking alcohol an smoking. Pt denies hitting her head, LOC, wrist pain, and right knee or shin pain.   PCP none  (was Dr Jeannetta Nap)  Past Medical History  Diagnosis Date  . Hypertension   . Chronic back pain   . Sciatic pain     right  . Rib pain on right side     chronic  . Chronic neck pain   . Cervical radiculopathy   . Chronic knee pain   . Rheumatoid arthritis (HCC)     "Rhematoid factor positive but no symptoms" per EPIC notes 2012  . Fibromyalgia   . Diabetes mellitus without complication (HCC)   . C. difficile diarrhea    Past Surgical History  Procedure Laterality Date  . Tubal ligation    . Bladder surgery     Family History  Problem Relation Age of Onset  . Cancer Mother   . Diabetes Mother   . Colon cancer Father     AFTER AGE 17  . Crohn's disease Sister    Social History  Substance  Use Topics  . Smoking status: Never Smoker   . Smokeless tobacco: Never Used  . Alcohol Use: No   On disability for RA  OB History    Gravida Para Term Preterm AB TAB SAB Ectopic Multiple Living   3 3 3       3      Review of Systems  Musculoskeletal: Positive for joint swelling and arthralgias.  Neurological: Positive for numbness. Negative for syncope.  All other systems reviewed and are negative.   Allergies  Mango flavor; Penicillins; Tramadol; Bactrim; Keflex; and Toradol  Home Medications   Prior to Admission medications   Medication Sig Start Date End Date Taking? Authorizing Provider  HYDROcodone-acetaminophen (NORCO/VICODIN) 5-325 MG tablet Take 1 tablet by mouth every 4 (four) hours as needed. 08/11/15  Yes 14/4/16, PA-C  lisinopril-hydrochlorothiazide (PRINZIDE,ZESTORETIC) 20-25 MG per tablet Take 2 tablets by mouth every morning.    Yes Historical Provider, MD  methocarbamol (ROBAXIN) 500 MG tablet Take 2 tablets (1,000 mg total) by mouth 4 (four) times daily. 08/11/15 08/21/15 Yes 08/23/15 Idol, PA-C  venlafaxine XR (EFFEXOR-XR) 75 MG 24 hr capsule Take 225 mg by mouth at bedtime.    Yes Historical Provider, MD  cholestyramine Raynelle Fanning) 4 G packet Take 0.5 packets (2 g  total) by mouth daily. 05/23/15   Nira Retort, NP  ciprofloxacin (CIPRO) 500 MG tablet Take 1 tablet (500 mg total) by mouth every 12 (twelve) hours. 08/05/15   Geoffery Lyons, MD  dicyclomine (BENTYL) 20 MG tablet Take 1 tablet (20 mg total) by mouth 2 (two) times daily. Patient not taking: Reported on 05/26/2015 05/21/15   Azalia Bilis, MD  diphenoxylate-atropine (LOMOTIL) 2.5-0.025 MG per tablet Take 2 tablets by mouth 4 (four) times daily as needed for diarrhea or loose stools. Patient not taking: Reported on 05/09/2015 05/04/15   Gilda Crease, MD  Eluxadoline (VIBERZI) 100 MG TABS Take 100 mg by mouth 2 (two) times daily. 06/06/15   West Bali, MD  hyoscyamine (LEVSIN SL) 0.125 MG SL tablet Place  1 tablet (0.125 mg total) under the tongue every 4 (four) hours as needed. 06/04/15   Nira Retort, NP  naproxen (NAPROSYN) 500 MG tablet Take 1 po BID with food prn pain 08/11/15   [name], MD  ondansetron (ZOFRAN) 4 MG tablet Take 1 tablet (4 mg total) by mouth every 6 (six) hours as needed for nausea or vomiting. Patient not taking: Reported on 05/21/2015 05/09/15   West Bali, MD  oxyCODONE-acetaminophen (PERCOCET) 5-325 MG per tablet Take 1-2 tablets by mouth every 4 (four) hours as needed. Patient not taking: Reported on 05/09/2015 05/04/15   Gilda Crease, MD  predniSONE (DELTASONE) 10 MG tablet 6, 5, 4, 3, 2 then 1 tablet by mouth daily for 6 days total. 08/11/15   Burgess Amor, PA-C  promethazine (PHENERGAN) 25 MG tablet Take 1 tablet (25 mg total) by mouth every 6 (six) hours as needed for nausea or vomiting. Patient not taking: Reported on 05/21/2015 05/04/15   Gilda Crease, MD  promethazine (PHENERGAN) 25 MG tablet Take 1 tablet (25 mg total) by mouth every 6 (six) hours as needed for nausea or vomiting. 05/26/15   Courteney Lyn Mackuen, MD  vancomycin (VANCOCIN) 250 MG capsule 1 PO Q6H FOR 7 DAYS THEN PO Q8H FOR 7 DAYS THEN PO Q12H FOR 7 DAYS THEN PO QD FOR 7 DAYS 05/15/15   West Bali, MD   BP 131/86 mmHg  Pulse 125  Temp(Src) 98.7 F (37.1 C) (Oral)  Resp 18  Ht 5' (1.524 m)  Wt 198 lb (89.812 kg)  BMI 38.67 kg/m2  SpO2 97%  LMP 07/29/2015  Vital signs normal except for tachycardia  Physical Exam  Constitutional: She is oriented to person, place, and time. She appears well-developed and well-nourished.  Non-toxic appearance. She does not appear ill. No distress.  HENT:  Head: Normocephalic and atraumatic.  Right Ear: External ear normal.  Left Ear: External ear normal.  Nose: Nose normal. No mucosal edema or rhinorrhea.  Mouth/Throat: Mucous membranes are normal. No dental abscesses or uvula swelling.  Eyes: Conjunctivae and EOM are normal.  Neck: Normal  range of motion and full passive range of motion without pain.  Pulmonary/Chest: Effort normal. No respiratory distress. She has no rhonchi. She exhibits no crepitus.  Abdominal: Normal appearance.  Musculoskeletal: Normal range of motion. She exhibits edema and tenderness.  Non-tender R knee and R lower leg. Minimal swelling anterior to the right lateral malleolus. Nontender medially. Nontender foot. Good distal pusles.    Neurological: She is alert and oriented to person, place, and time. She has normal strength. No cranial nerve deficit.  Skin: Skin is warm, dry and intact. No rash noted. No erythema. No pallor.  Psychiatric: She has a normal mood and affect. Her speech is normal and behavior is normal. Her mood appears not anxious.  Nursing note and vitals reviewed.   ED Course  Procedures   Medications  naproxen (NAPROSYN) tablet 500 mg (not administered)    DIAGNOSTIC STUDIES: Oxygen Saturation is 97% on RA, adequate by my interpretation.    COORDINATION OF CARE: 11:21 PM - Discussed plans to wait on XR interpretation. Will refer to an orthopedist. Pt advised of plan for treatment and pt agrees. Pt was placed in an ASO and crutches.   11:23 PM - Discussed normal XR results.    Pt has had 22 ED visits in the past 5 months with 2 admissions. She was just in the ED this afternoon for back pain with sciatica on the right.   Pt has 11 narcotic scripts since June 19, mainly from out ED system. Pt is frequent visitor for chronic abdominal pain.    Imaging Review Dg Ankle Complete Right  08/11/2015  CLINICAL DATA:  44 year old female with right lateral ankle trauma and pain. EXAM: RIGHT ANKLE - COMPLETE 3+ VIEW COMPARISON:  None. FINDINGS: There is no acute fracture or dislocation. The ankle mortise is intact. A 9 x 5 mm ovoid lesion in the distal tibia likely represent an enostosis. There is minimal soft tissue swelling over the lateral malleolus. No radiopaque foreign object  identified. IMPRESSION: No acute fracture or dislocation. Electronically Signed   By: Elgie Collard M.D.   On: 08/11/2015 23:12   I have personally reviewed and evaluated these images and lab results as part of my medical decision-making.   EKG Interpretation None      MDM   Final diagnoses:  Sprain of ankle, right, initial encounter   New Prescriptions   NAPROXEN (NAPROSYN) 500 MG TABLET    Take 1 po BID with food prn pain    Plan discharge  [name], MD, FACEP    I personally performed the services described in this documentation, which was scribed in my presence. The recorded information has been reviewed and considered.  [name], MD, Concha Pyo, MD 08/12/15 [phone]

## 2015-08-11 NOTE — ED Provider Notes (Signed)
CSN: [id number]     Arrival date & time 08/11/15  1453 History   By signing my name below, I, Gwenyth Ober, attest that this documentation has been prepared under the direction and in the presence of Burgess Amor, PA-C.  Electronically Signed: Gwenyth Ober, ED Scribe. 08/11/2015. 4:12 PM.  Chief Complaint  Patient presents with  . Back Pain   The history is provided by the patient. No language interpreter was used.    HPI Comments: Regina Patton is a 44 y.o. female with a history of rheumatoid arthritis who presents to the Emergency Department complaining of intermittent, moderate right lower back pain that radiates down her right leg and started 2 days ago. She states intermittent numbness of her right leg as associated symptoms. Pt tried Ibuprofen and Motrin with no relief. Pt reports pain started while cleaning, but denies any obvious injuries. She has a history of sciatica, but states this episode is more severe than prior pain. Her prior exacerbations were treated with steroids and muscle relaxants. Pt is unemployed. She denies bladder or bowel incontinence.  Prior PCP Jeannetta Nap, but pt denies current PCP  Past Medical History  Diagnosis Date  . Hypertension   . Chronic back pain   . Sciatic pain     right  . Rib pain on right side     chronic  . Chronic neck pain   . Cervical radiculopathy   . Chronic knee pain   . Rheumatoid arthritis (HCC)     "Rhematoid factor positive but no symptoms" per EPIC notes 2012  . Fibromyalgia   . Diabetes mellitus without complication (HCC)   . C. difficile diarrhea    Past Surgical History  Procedure Laterality Date  . Tubal ligation    . Bladder surgery     Family History  Problem Relation Age of Onset  . Cancer Mother   . Diabetes Mother   . Colon cancer Father     AFTER AGE 72  . Crohn's disease Sister    Social History  Substance Use Topics  . Smoking status: Never Smoker   . Smokeless tobacco: Never Used  . Alcohol Use:  No   OB History    Gravida Para Term Preterm AB TAB SAB Ectopic Multiple Living   3 3 3       3      Review of Systems  Constitutional: Negative for fever.  Respiratory: Negative for shortness of breath.   Cardiovascular: Negative for chest pain and leg swelling.  Gastrointestinal: Negative for abdominal pain, constipation and abdominal distention.  Genitourinary: Negative for dysuria, urgency, frequency, flank pain and difficulty urinating.  Musculoskeletal: Positive for back pain. Negative for joint swelling and gait problem.  Skin: Negative for rash.  Neurological: Positive for numbness.   Allergies  Mango flavor; Penicillins; Tramadol; Bactrim; Keflex; and Toradol  Home Medications   Prior to Admission medications   Medication Sig Start Date End Date Taking? Authorizing Provider  cholestyramine (QUESTRAN) 4 G packet Take 0.5 packets (2 g total) by mouth daily. 05/23/15   05/25/15, NP  ciprofloxacin (CIPRO) 500 MG tablet Take 1 tablet (500 mg total) by mouth every 12 (twelve) hours. 08/05/15   08/07/15, MD  dicyclomine (BENTYL) 20 MG tablet Take 1 tablet (20 mg total) by mouth 2 (two) times daily. Patient not taking: Reported on 05/26/2015 05/21/15   05/23/15, MD  diphenoxylate-atropine (LOMOTIL) 2.5-0.025 MG per tablet Take 2 tablets by mouth 4 (four) times daily  as needed for diarrhea or loose stools. Patient not taking: Reported on 05/09/2015 05/04/15   Gilda Crease, MD  Eluxadoline (VIBERZI) 100 MG TABS Take 100 mg by mouth 2 (two) times daily. 06/06/15   West Bali, MD  HYDROcodone-acetaminophen (NORCO/VICODIN) 5-325 MG tablet Take 1 tablet by mouth every 4 (four) hours as needed. 08/11/15   Burgess Amor, PA-C  hyoscyamine (LEVSIN SL) 0.125 MG SL tablet Place 1 tablet (0.125 mg total) under the tongue every 4 (four) hours as needed. 06/04/15   Nira Retort, NP  lisinopril-hydrochlorothiazide (PRINZIDE,ZESTORETIC) 20-25 MG per tablet Take 2 tablets by mouth every  morning.     Historical Provider, MD  methocarbamol (ROBAXIN) 500 MG tablet Take 2 tablets (1,000 mg total) by mouth 4 (four) times daily. 08/11/15 08/21/15  Burgess Amor, PA-C  naproxen (NAPROSYN) 500 MG tablet Take 1 po BID with food prn pain 08/11/15   [name], MD  ondansetron (ZOFRAN) 4 MG tablet Take 1 tablet (4 mg total) by mouth every 6 (six) hours as needed for nausea or vomiting. Patient not taking: Reported on 05/21/2015 05/09/15   West Bali, MD  oxyCODONE-acetaminophen (PERCOCET) 5-325 MG per tablet Take 1-2 tablets by mouth every 4 (four) hours as needed. Patient not taking: Reported on 05/09/2015 05/04/15   Gilda Crease, MD  predniSONE (DELTASONE) 10 MG tablet 6, 5, 4, 3, 2 then 1 tablet by mouth daily for 6 days total. 08/11/15   Burgess Amor, PA-C  promethazine (PHENERGAN) 25 MG tablet Take 1 tablet (25 mg total) by mouth every 6 (six) hours as needed for nausea or vomiting. Patient not taking: Reported on 05/21/2015 05/04/15   Gilda Crease, MD  promethazine (PHENERGAN) 25 MG tablet Take 1 tablet (25 mg total) by mouth every 6 (six) hours as needed for nausea or vomiting. 05/26/15   Courteney Lyn Mackuen, MD  vancomycin (VANCOCIN) 250 MG capsule 1 PO Q6H FOR 7 DAYS THEN PO Q8H FOR 7 DAYS THEN PO Q12H FOR 7 DAYS THEN PO QD FOR 7 DAYS 05/15/15   West Bali, MD  venlafaxine XR (EFFEXOR-XR) 75 MG 24 hr capsule Take 225 mg by mouth at bedtime.     Historical Provider, MD   BP 140/103 mmHg  Pulse 101  Temp(Src) 98.3 F (36.8 C) (Oral)  Resp 16  Ht 5' (1.524 m)  Wt 89.812 kg  BMI 38.67 kg/m2  SpO2 99%  LMP 07/29/2015 Physical Exam  Constitutional: She appears well-developed and well-nourished.  HENT:  Head: Normocephalic.  Eyes: Conjunctivae are normal.  Neck: Normal range of motion. Neck supple.  Cardiovascular: Normal rate and intact distal pulses.   Pedal pulses normal.  Pulmonary/Chest: Effort normal.  Abdominal: Soft. Bowel sounds are normal. She exhibits no  distension and no mass.  Musculoskeletal: Normal range of motion. She exhibits no edema.       Lumbar back: She exhibits tenderness. She exhibits no swelling, no edema and no spasm.  Positive straight leg raise right at 90 degrees TTP at right lower lumbar and sacral area  Neurological: She is alert. She has normal strength. She displays no atrophy and no tremor. No sensory deficit. Gait normal.  Reflex Scores:      Patellar reflexes are 2+ on the right side and 2+ on the left side.      Achilles reflexes are 2+ on the right side and 2+ on the left side. No strength deficit noted in hip and knee flexor and extensor  muscle groups.  Ankle flexion and extension intact.  Skin: Skin is warm and dry.  Psychiatric: She has a normal mood and affect.  Nursing note and vitals reviewed.   ED Course  Procedures  DIAGNOSTIC STUDIES: Oxygen Saturation is 99% on RA, normal by my interpretation.    COORDINATION OF CARE: 4:13 PM Discussed treatment plan with pt which includes pain management, heat and a referral to a PCP. She agreed to plan.  Labs Review Labs Reviewed - No data to display  Imaging Review No results found.   EKG Interpretation None      MDM   Final diagnoses:  Sciatica of right side    No neuro deficit on exam or by history to suggest emergent or surgical presentation.  Also discussed worsened sx that should prompt immediate re-evaluation including distal weakness, bowel/bladder retention/incontinence. Pt with chronic pain associated with rheumatoid arthritis,  Has had difficulty locating a primary doctor since moving [address].  Unable to find anyone accepting medicare.  Referrals given for obtaining pcp.  Prescribed small quant of hydrocodone, along with  robaxin, prednisone. Activity as tolerated.  Pt encouraged to obtain pcp.   I personally performed the services described in this documentation, which was scribed in my presence. The recorded information has  been reviewed and is accurate.   Burgess Amor, PA-C 08/13/15 [id number]  Donnetta Hutching, MD 08/15/15 [phone]

## 2015-08-11 NOTE — Discharge Instructions (Signed)
Sciatica °Sciatica is pain, weakness, numbness, or tingling along the path of the sciatic nerve. The nerve starts in the lower back and runs down the back of each leg. The nerve controls the muscles in the lower leg and in the back of the knee, while also providing sensation to the back of the thigh, lower leg, and the sole of your foot. Sciatica is a symptom of another medical condition. For instance, nerve damage or certain conditions, such as a herniated disk or bone spur on the spine, pinch or put pressure on the sciatic nerve. This causes the pain, weakness, or other sensations normally associated with sciatica. Generally, sciatica only affects one side of the body. °CAUSES  °· Herniated or slipped disc. °· Degenerative disk disease. °· A pain disorder involving the narrow muscle in the buttocks (piriformis syndrome). °· Pelvic injury or fracture. °· Pregnancy. °· Tumor (rare). °SYMPTOMS  °Symptoms can vary from mild to very severe. The symptoms usually travel from the low back to the buttocks and down the back of the leg. Symptoms can include: °· Mild tingling or dull aches in the lower back, leg, or hip. °· Numbness in the back of the calf or sole of the foot. °· Burning sensations in the lower back, leg, or hip. °· Sharp pains in the lower back, leg, or hip. °· Leg weakness. °· Severe back pain inhibiting movement. °These symptoms may get worse with coughing, sneezing, laughing, or prolonged sitting or standing. Also, being overweight may worsen symptoms. °DIAGNOSIS  °Your caregiver will perform a physical exam to look for common symptoms of sciatica. He or she may ask you to do certain movements or activities that would trigger sciatic nerve pain. Other tests may be performed to find the cause of the sciatica. These may include: °· Blood tests. °· X-rays. °· Imaging tests, such as an MRI or CT scan. °TREATMENT  °Treatment is directed at the cause of the sciatic pain. Sometimes, treatment is not necessary  and the pain and discomfort goes away on its own. If treatment is needed, your caregiver may suggest: °· Over-the-counter medicines to relieve pain. °· Prescription medicines, such as anti-inflammatory medicine, muscle relaxants, or narcotics. °· Applying heat or ice to the painful area. °· Steroid injections to lessen pain, irritation, and inflammation around the nerve. °· Reducing activity during periods of pain. °· Exercising and stretching to strengthen your abdomen and improve flexibility of your spine. Your caregiver may suggest losing weight if the extra weight makes the back pain worse. °· Physical therapy. °· Surgery to eliminate what is pressing or pinching the nerve, such as a bone spur or part of a herniated disk. °HOME CARE INSTRUCTIONS  °· Only take over-the-counter or prescription medicines for pain or discomfort as directed by your caregiver. °· Apply ice to the affected area for 20 minutes, 3-4 times a day for the first 48-72 hours. Then try heat in the same way. °· Exercise, stretch, or perform your usual activities if these do not aggravate your pain. °· Attend physical therapy sessions as directed by your caregiver. °· Keep all follow-up appointments as directed by your caregiver. °· Do not wear high heels or shoes that do not provide proper support. °· Check your mattress to see if it is too soft. A firm mattress may lessen your pain and discomfort. °SEEK IMMEDIATE MEDICAL CARE IF:  °· You lose control of your bowel or bladder (incontinence). °· You have increasing weakness in the lower back, pelvis, buttocks,   or legs.  You have redness or swelling of your back.  You have a burning sensation when you urinate.  You have pain that gets worse when you lie down or awakens you at night.  Your pain is worse than you have experienced in the past.  Your pain is lasting longer than 4 weeks.  You are suddenly losing weight without reason. MAKE SURE YOU:  Understand these  instructions.  Will watch your condition.  Will get help right away if you are not doing well or get worse.   This information is not intended to replace advice given to you by your health care provider. Make sure you discuss any questions you have with your health care provider.   Document Released: 08/18/2001 Document Revised: 05/15/2015 Document Reviewed: 01/03/2012 Elsevier Interactive Patient Education 2016 [employer].   Emergency Department Resource Guide 1) Find a Doctor and Pay Out of Pocket Although you won't have to find out who is covered by your insurance plan, it is a good idea to ask around and get recommendations. You will then need to call the office and see if the doctor you have chosen will accept you as a new patient and what types of options they offer for patients who are self-pay. Some doctors offer discounts or will set up payment plans for their patients who do not have insurance, but you will need to ask so you aren't surprised when you get to your appointment.  2) Contact Your Local Health Department Not all health departments have doctors that can see patients for sick visits, but many do, so it is worth a call to see if yours does. If you don't know where your local health department is, you can check in your phone book. The CDC also has a tool to help you locate your state's health department, and many state websites also have listings of all of their local health departments.  3) Find a Walk-in Clinic If your illness is not likely to be very severe or complicated, you may want to try a walk in clinic. These are popping up all over the country in pharmacies, drugstores, and shopping centers. They're usually staffed by nurse practitioners or physician assistants that have been trained to treat common illnesses and complaints. They're usually fairly quick and inexpensive. However, if you have serious medical issues or chronic medical problems, these are probably not  your best option.  No Primary Care Doctor: - Call Health Connect at  [phone] - they can help you locate a primary care doctor that  accepts your insurance, provides certain services, etc. - Physician Referral Service- [phone]  Chronic Pain Problems: Organization         Address  Phone   Notes  [facility]  [phone] Patients need to be referred by their primary care doctor.   Medication Assistance: Organization         Address  Phone   Notes  [facility] Medication [facility] [address] [place] [place], [place] [address] [phone] --Must be a resident of [facility] -- Must have NO insurance coverage whatsoever (no Medicaid/ Medicare, etc.) -- The pt. MUST have a primary care doctor that directs their care regularly and follows them in the community   [facility]  [phone]   [employer]  [phone]    Agencies that provide inexpensive medical care: Organization         Address  Phone   Notes  [facility]  [phone]   [facility]    [phone]   [facility] [address] [place], [place] [address] [phone]   [facility] [address] [place] [place] [phone]   Planned Parenthood    [phone]   [facility]    [phone]   Community Health and [facility]  [address], [place] Phone:  [phone], Fax:  [phone] Hours of Operation:  9 am - 6 pm, M-F.  Also accepts Medicaid/Medicare and self-pay.  [facility]  [address], [place] Phone: [phone], Fax: [phone]. Hours of Operation:  8:30 am - 5:30 pm, M-F.  Also accepts Medicaid and self-pay.  [facility] [place] [address], [place] Phone: [phone]   [facility] [address] Natasha [place], [place] [phone], Ext. [phone] Mondays & Thursdays: 7-9 AM.  First  15 patients are seen on a first come, first serve basis.    Medicaid-accepting [facility] Providers:  Organization         Address  Phone   Notes  [facility] [address], [place] [phone] Also accepts self-pay patients.  [facility] [address] Laurell Josephs [place], [place]  [phone]   [facility] [address], [place] [phone]   [facility] [address], [place] [phone]   Renaye Rakers [address], [place]   [phone] Only accepts [place] Access [place] patients after they have their name applied to their card.   Self-Pay (no insurance) in [facility]:  Organization         Address  Phone   Notes  Sickle Cell Patients, [facility] Internal Medicine [address] [place], [place] [phone]   [facility] [address] [place], [place] [phone]   [facility] [place]  [address], [place] [phone]   [facility]/Dr. Osei-Bonsu  [address] [place] or [address], [place] [phone] Phone number for both [place] and [place] locations is the same.  [facility]gent Medical and [facility] [address] [place] [phone]   [facility] [address] [place] or [address] [phone]   [facility] [address] [place] [phone], phone; [phone], fax Sees patients 1st and 3rd Saturday of every month.  Must not qualify for public or private insurance (i.e. Medicaid, Medicare, [place] Health Choice, Veterans' Benefits)  Household income should be no more than 200% of the poverty level The clinic cannot treat you if you are pregnant or think you are pregnant  Sexually transmitted diseases are not treated at the clinic.    Dental  Care: Organization         Address  Phone  Notes  [facility] Department of [facility] [address] [place], [place] [phone] Accepts children up to age 75 who are enrolled in [place] or  Health Choice; pregnant women with a Medicaid card; and children who have applied for Medicaid or  Health Choice, but were declined, whose parents can pay a reduced fee at time of service.  [facility] Department of  [facility]  [address], [place] [phone] Accepts children up to age 3 who are enrolled in Medicaid or [place]; pregnant women with a Medicaid card; and children who have applied for Medicaid or [place] Health Choice, but were declined, whose parents can pay a reduced fee at time of service.  [facility]  [place] [phone] Patients are seen by appointment only. Walk-ins are not accepted. [place] will see patients 80 years of age and older. Monday - Tuesday (8am-5pm) Most Wednesdays (8:30-5pm) $30 per visit, cash only  [facility] Adult Dental Access PROGRAM  [address], [facility] [phone] Patients are seen by appointment only. Walk-ins are not accepted. [place] will see patients 28 years of age and older. One Wednesday Evening (Monthly: Volunteer Based).  $30 per visit, cash only  [place]  [phone] for adults; Children under age 55, call Graduate Pediatric Dentistry at [phone]. Children aged 58-14, please call [phone] to request a pediatric application.  Dental services are provided in all areas of dental care including fillings, crowns and bridges, complete and partial dentures, implants, gum treatment, root canals, and extractions. Preventive care is also provided. Treatment is provided to both adults and children. Patients are selected via a lottery and there is often a waiting list.   [facility] [address] [place]  [phone] [email or web address]   [facility] [address] [place], [place] [phone], Ext. [phone] Second and Fourth Thursday of each month, opens at 6:30 AM; Clinic ends at 9 AM.  Patients are seen on a first-come first-served basis, and a limited number are seen during each clinic.   [facility]  [address] Hillard Danker [place], [place] [phone]   Eligibility Requirements You must have lived in [place], [place], or [place] counties for at least the last three months.   You cannot be eligible for state or federal sponsored [employer], including [employer], [place], or [employer].   You generally cannot be eligible for healthcare insurance through your employer.    How to apply: Eligibility screenings are held every Tuesday and Wednesday afternoon from 1:00 pm until 4:00 pm. You do not need an appointment for the interview!  [facility] [address], [place], [place]   Between  [place] Department  [place]  [phone]    Behavioral Health Resources in the Community: Intensive Outpatient Programs Organization         Address  Phone  Notes  [place]. [address] [place], [place] [phone]   [facility] Out[facility] [address] [place], [place]   [facility] [address] [place], [place]   [place] [address],  [place], [place] or [phone]   Substance Abuse Resources Organization         Address  Phone  Notes  [facility] Drug [facility]  [phone]   [place]  [phone]   [facility]   Chinita Pester  [phone]   Residential & Outpatient Substance Abuse Program  [phone]    Psychological Services Organization         Address  Phone  Notes  [place]  [phone]- [phone]   [employer]   [facility] Mental Health [address], [place] [phone] or [phone]    Mobile Crisis Teams Organization         Address  Phone  Notes  [facility], Mobile Crisis Care Unit  [phone]   [facility]  [address] [place], [place] [phone]   Doristine Locks [address] [place] [phone]    Self-Help/Support Groups Organization         Address  Phone             Notes  [facility] [place] - variety of support groups  336- I7437963 Call for more information  Narcotics Anonymous (NA), [facility] [address], [employer] [place]  2 meetings at this location   Statistician         Address  Phone  Notes  [facility] [address],    [place]  [phone]   [facility]  [address] [place] [address], [place], [place] [phone]   [facility] [address] [place], [place] [phone] Admissions: 8am-3pm M-F  [facility] [address],    [place], [place] [phone]   [facility] [address] [place], [place], [place] [phone]   [facility] [address],  [place], [place] [phone]   [facility] - Intensive Outpatient [address] Laure[address] Josephs [address], [place], [place] [phone]   [facility] ([facility]iction [facility] [address] [place].,  [place], [place] [phone] or [phone]   [facility] ([facility]) [address], [place], [place] [phone] Accepts Medicaid  [facility] [place] [address]  [place] [phone] Substance Abuse/Addiction Treatment   [facility] Organization         Address  Phone  Notes  [facility]  [phone]   Angie Fava, PhD [address] Ervin Knack [place], [place]   [phone] or [phone]   [facility] Behavioral   [address] [place], [place] [phone]   [facility] [address], [place], [place] [phone] Insurance/Medicaid/sponsorship through [facility] [address]                                    [place], [place] [phone] Therapy/tele-psych/case  [facility] [address], [place] [phone]    Dr. Lolly Mustache  [phone]   [facility]  [employer] [facility] Dept. 1) [address], [place] 2) [address] [place] 3)  [address], [place] [phone]  [phone]   [facility] Child Abuse Hotline (431)866-2932 or (725)129-3916 (After Hours)         Take your next dose of prednisone tomorrow evening.  Use the the other medicines as directed.  Do not drive within 4 hours of taking hydrocodone as this will make you drowsy.  Avoid lifting,  Bending,  Twisting or any other activity that worsens your pain over the next week.  Apply an  icepack  to your lower back for 10-15 minutes every 2 hours for the next 2 days.  You should get rechecked if your symptoms are not better over the next  5 days,  Or you develop increased pain,  Weakness in your leg(s) or loss of bladder or bowel function - these are symptoms of a worsening problem.

## 2015-08-11 NOTE — ED Notes (Signed)
Pt c/o right ankle pain.

## 2015-08-11 NOTE — ED Notes (Signed)
Pt reports low back pain that radiates down her right leg. Pt reports taking tylenol with no relief.

## 2015-08-12 DIAGNOSIS — S93401A Sprain of unspecified ligament of right ankle, initial encounter: Secondary | ICD-10-CM | POA: Diagnosis not present

## 2015-08-12 NOTE — ED Notes (Signed)
Pt states understanding of care given and follow up instructions.  Refused to use crutches and ambulated to car carrying crutches.

## 2015-08-29 ENCOUNTER — Encounter (HOSPITAL_COMMUNITY): Payer: Self-pay | Admitting: *Deleted

## 2015-08-29 ENCOUNTER — Emergency Department (HOSPITAL_COMMUNITY): Payer: Medicare Other

## 2015-08-29 ENCOUNTER — Emergency Department (HOSPITAL_COMMUNITY)
Admission: EM | Admit: 2015-08-29 | Discharge: 2015-08-29 | Disposition: A | Discharge status: Home / Self Care | Payer: Medicare Other | Attending: Emergency Medicine | Admitting: Emergency Medicine

## 2015-08-29 DIAGNOSIS — Z8619 Personal history of other infectious and parasitic diseases: Secondary | ICD-10-CM | POA: Insufficient documentation

## 2015-08-29 DIAGNOSIS — S99911A Unspecified injury of right ankle, initial encounter: Secondary | ICD-10-CM | POA: Diagnosis present

## 2015-08-29 DIAGNOSIS — Z79899 Other long term (current) drug therapy: Secondary | ICD-10-CM | POA: Insufficient documentation

## 2015-08-29 DIAGNOSIS — S93401A Sprain of unspecified ligament of right ankle, initial encounter: Secondary | ICD-10-CM | POA: Diagnosis not present

## 2015-08-29 DIAGNOSIS — Y998 Other external cause status: Secondary | ICD-10-CM | POA: Insufficient documentation

## 2015-08-29 DIAGNOSIS — Y9289 Other specified places as the place of occurrence of the external cause: Secondary | ICD-10-CM | POA: Insufficient documentation

## 2015-08-29 DIAGNOSIS — Y9301 Activity, walking, marching and hiking: Secondary | ICD-10-CM | POA: Diagnosis not present

## 2015-08-29 DIAGNOSIS — G8929 Other chronic pain: Secondary | ICD-10-CM | POA: Diagnosis not present

## 2015-08-29 DIAGNOSIS — M069 Rheumatoid arthritis, unspecified: Secondary | ICD-10-CM | POA: Diagnosis not present

## 2015-08-29 DIAGNOSIS — M797 Fibromyalgia: Secondary | ICD-10-CM | POA: Insufficient documentation

## 2015-08-29 DIAGNOSIS — I10 Essential (primary) hypertension: Secondary | ICD-10-CM | POA: Diagnosis not present

## 2015-08-29 DIAGNOSIS — Z880 Allergy status to penicillin: Secondary | ICD-10-CM | POA: Diagnosis not present

## 2015-08-29 DIAGNOSIS — E119 Type 2 diabetes mellitus without complications: Secondary | ICD-10-CM | POA: Insufficient documentation

## 2015-08-29 DIAGNOSIS — W19XXXA Unspecified fall, initial encounter: Secondary | ICD-10-CM | POA: Insufficient documentation

## 2015-08-29 MED ORDER — HYDROCODONE-ACETAMINOPHEN 5-325 MG PO TABS
1.0000 | ORAL_TABLET | Freq: Once | ORAL | Status: AC
  Administered 2015-08-29: 1 via ORAL
  Filled 2015-08-29: qty 1

## 2015-08-29 MED ORDER — DICLOFENAC SODIUM 50 MG PO TBEC: 50.0000 mg | DELAYED_RELEASE_TABLET | Freq: Two times a day (BID) | ORAL | Status: DC

## 2015-08-29 NOTE — ED Notes (Signed)
Pt states she stepped off a curb and rolled right ankle. Hx of recent sprain.

## 2015-08-29 NOTE — ED Provider Notes (Signed)
CSN: [id number]     Arrival date & time 08/29/15  1503 History   First MD Initiated Contact with Patient 08/29/15 1604     Chief Complaint  Patient presents with  . Ankle Pain     (Consider location/radiation/quality/duration/timing/severity/associated sxs/prior Treatment) Patient is a 44 y.o. female presenting with ankle pain. The history is provided by the patient.  Ankle Pain Location:  Ankle Injury: yes   Mechanism of injury: fall   Fall:    Fall occurred:  Walking Ankle location:  R ankle  Orva I Nordquist is a 44 y.o. female who presents to the ED with right ankle pain. She reports stepping off a curb and turning her ankle. She denies any other injuries.   Past Medical History  Diagnosis Date  . Hypertension   . Chronic back pain   . Sciatic pain     right  . Rib pain on right side     chronic  . Chronic neck pain   . Cervical radiculopathy   . Chronic knee pain   . Rheumatoid arthritis (HCC)     "Rhematoid factor positive but no symptoms" per EPIC notes 2012  . Fibromyalgia   . C. difficile diarrhea   . Diabetes mellitus without complication (HCC)     diet controlled   Past Surgical History  Procedure Laterality Date  . Tubal ligation    . Bladder surgery     Family History  Problem Relation Age of Onset  . Cancer Mother   . Diabetes Mother   . Colon cancer Father     AFTER AGE 44  . Crohn's disease Sister    Social History  Substance Use Topics  . Smoking status: Never Smoker   . Smokeless tobacco: Never Used  . Alcohol Use: No   OB History    Gravida Para Term Preterm AB TAB SAB Ectopic Multiple Living   3 3 3       3      Review of Systems  Musculoskeletal: Positive for arthralgias.       Right ankle pain  all other systems negative    Allergies  Mango flavor; Penicillins; Tramadol; Bactrim; Keflex; and Toradol  Home Medications   Prior to Admission medications   Medication Sig Start Date End Date Taking? Authorizing Provider   cholestyramine (QUESTRAN) 4 G packet Take 0.5 packets (2 g total) by mouth daily. 05/23/15   05/25/15, NP  ciprofloxacin (CIPRO) 500 MG tablet Take 1 tablet (500 mg total) by mouth every 12 (twelve) hours. 08/05/15   08/07/15, MD  diclofenac (VOLTAREN) 50 MG EC tablet Take 1 tablet (50 mg total) by mouth 2 (two) times daily. 08/29/15   Hope 08/31/15, NP  dicyclomine (BENTYL) 20 MG tablet Take 1 tablet (20 mg total) by mouth 2 (two) times daily. Patient not taking: Reported on 05/26/2015 05/21/15   05/23/15, MD  diphenoxylate-atropine (LOMOTIL) 2.5-0.025 MG per tablet Take 2 tablets by mouth 4 (four) times daily as needed for diarrhea or loose stools. Patient not taking: Reported on 05/09/2015 05/04/15   05/06/15, MD  Eluxadoline (VIBERZI) 100 MG TABS Take 100 mg by mouth 2 (two) times daily. 06/06/15   06/08/15, MD  HYDROcodone-acetaminophen (NORCO/VICODIN) 5-325 MG tablet Take 1 tablet by mouth every 4 (four) hours as needed. 08/11/15   14/4/16, PA-C  hyoscyamine (LEVSIN SL) 0.125 MG SL tablet Place 1 tablet (0.125 mg total) under the tongue every 4 (four)  hours as needed. 06/04/15   Nira Retort, NP  lisinopril-hydrochlorothiazide (PRINZIDE,ZESTORETIC) 20-25 MG per tablet Take 2 tablets by mouth every morning.     Historical Provider, MD  ondansetron (ZOFRAN) 4 MG tablet Take 1 tablet (4 mg total) by mouth every 6 (six) hours as needed for nausea or vomiting. Patient not taking: Reported on 05/21/2015 05/09/15   West Bali, MD  predniSONE (DELTASONE) 10 MG tablet 6, 5, 4, 3, 2 then 1 tablet by mouth daily for 6 days total. 08/11/15   Burgess Amor, PA-C  promethazine (PHENERGAN) 25 MG tablet Take 1 tablet (25 mg total) by mouth every 6 (six) hours as needed for nausea or vomiting. Patient not taking: Reported on 05/21/2015 05/04/15   Gilda Crease, MD  promethazine (PHENERGAN) 25 MG tablet Take 1 tablet (25 mg total) by mouth every 6 (six) hours as needed for nausea or  vomiting. 05/26/15   Courteney Lyn Mackuen, MD  vancomycin (VANCOCIN) 250 MG capsule 1 PO Q6H FOR 7 DAYS THEN PO Q8H FOR 7 DAYS THEN PO Q12H FOR 7 DAYS THEN PO QD FOR 7 DAYS 05/15/15   West Bali, MD  venlafaxine XR (EFFEXOR-XR) 75 MG 24 hr capsule Take 225 mg by mouth at bedtime.     Historical Provider, MD   BP 124/78 mmHg  Pulse 70  Temp(Src) 98 F (36.7 C) (Oral)  Resp 18  SpO2 96%  LMP 07/26/2015 Physical Exam  Constitutional: She is oriented to person, place, and time. She appears well-developed and well-nourished. No distress.  HENT:  Head: Normocephalic.  Eyes: Conjunctivae and EOM are normal.  Neck: Normal range of motion. Neck supple.  Cardiovascular: Normal rate.   Pulmonary/Chest: Effort normal.  Musculoskeletal:       Right ankle: She exhibits swelling and ecchymosis. She exhibits normal range of motion, no laceration and normal pulse. Tenderness. Lateral malleolus tenderness found. Achilles tendon normal.  Pedal pulses 2+, adequate circulation, good touch sensation. Normal strength with plantar and dorsiflexion.   Neurological: She is alert and oriented to person, place, and time. No cranial nerve deficit.  Skin: Skin is warm and dry.  Psychiatric: She has a normal mood and affect. Her behavior is normal.  Nursing note and vitals reviewed.   ED Course  Procedures (including critical care time) Labs Review Labs Reviewed - No data to display  Imaging Review Dg Ankle Complete Right  08/29/2015  CLINICAL DATA:  Pain and swelling of the lateral ankle. EXAM: RIGHT ANKLE - COMPLETE 3+ VIEW COMPARISON:  None. FINDINGS: There is no evidence of fracture, dislocation, or joint effusion. The ankle mortise is intact. There is a sclerotic lesion in the distal tibial metaphysis consistent with a bone island. Soft tissues are unremarkable. IMPRESSION: No acute osseous injury of the right ankle. Electronically Signed   By: Elige Ko   On: 08/29/2015 15:40    MDM  44 y.o.  female with right ankle pain s/p injury stable for d/c without focal neuro deficits. Splint, ice, elevation and pain management. Patient states she has crutches at home. She will follow up with ortho or return here as needed for problems. Discussed with the patient x-ray findings and plan of care and all questioned fully answered.   Final diagnoses:  Ankle sprain, right, initial encounter       [facility], NP 08/29/15 1937  Glynn Octave, MD 08/30/15 [phone]

## 2015-09-26 ENCOUNTER — Emergency Department (HOSPITAL_COMMUNITY)
Admission: EM | Admit: 2015-09-26 | Discharge: 2015-09-26 | Disposition: A | Discharge status: Home / Self Care | Payer: Medicare Other | Attending: Emergency Medicine | Admitting: Emergency Medicine

## 2015-09-26 ENCOUNTER — Emergency Department (HOSPITAL_COMMUNITY): Payer: Medicare Other

## 2015-09-26 ENCOUNTER — Encounter (HOSPITAL_COMMUNITY): Payer: Self-pay | Admitting: Emergency Medicine

## 2015-09-26 DIAGNOSIS — R1030 Lower abdominal pain, unspecified: Secondary | ICD-10-CM | POA: Diagnosis present

## 2015-09-26 DIAGNOSIS — Z3202 Encounter for pregnancy test, result negative: Secondary | ICD-10-CM | POA: Insufficient documentation

## 2015-09-26 DIAGNOSIS — E119 Type 2 diabetes mellitus without complications: Secondary | ICD-10-CM | POA: Diagnosis not present

## 2015-09-26 DIAGNOSIS — G8929 Other chronic pain: Secondary | ICD-10-CM | POA: Diagnosis not present

## 2015-09-26 DIAGNOSIS — I10 Essential (primary) hypertension: Secondary | ICD-10-CM | POA: Insufficient documentation

## 2015-09-26 DIAGNOSIS — M109 Gout, unspecified: Secondary | ICD-10-CM | POA: Diagnosis not present

## 2015-09-26 DIAGNOSIS — Z880 Allergy status to penicillin: Secondary | ICD-10-CM | POA: Insufficient documentation

## 2015-09-26 DIAGNOSIS — Z8619 Personal history of other infectious and parasitic diseases: Secondary | ICD-10-CM | POA: Diagnosis not present

## 2015-09-26 DIAGNOSIS — N390 Urinary tract infection, site not specified: Secondary | ICD-10-CM | POA: Insufficient documentation

## 2015-09-26 LAB — COMPREHENSIVE METABOLIC PANEL
ALT: 16 U/L (ref 14–54)
AST: 20 U/L (ref 15–41)
Albumin: 3.8 g/dL (ref 3.5–5.0)
Alkaline Phosphatase: 30 U/L — ABNORMAL LOW (ref 38–126)
Anion gap: 8 (ref 5–15)
BILIRUBIN TOTAL: 0.5 mg/dL (ref 0.3–1.2)
BUN: 8 mg/dL (ref 6–20)
CHLORIDE: 105 mmol/L (ref 101–111)
CO2: 27 mmol/L (ref 22–32)
CREATININE: 0.75 mg/dL (ref 0.44–1.00)
Calcium: 9 mg/dL (ref 8.9–10.3)
Glucose, Bld: 104 mg/dL — ABNORMAL HIGH (ref 65–99)
POTASSIUM: 3.3 mmol/L — AB (ref 3.5–5.1)
Sodium: 140 mmol/L (ref 135–145)
TOTAL PROTEIN: 7.3 g/dL (ref 6.5–8.1)

## 2015-09-26 LAB — CBC WITH DIFFERENTIAL/PLATELET
Basophils Absolute: 0 10*3/uL (ref 0.0–0.1)
Basophils Relative: 0 %
EOS PCT: 2 %
Eosinophils Absolute: 0.1 10*3/uL (ref 0.0–0.7)
HEMATOCRIT: 37.4 % (ref 36.0–46.0)
Hemoglobin: 12.6 g/dL (ref 12.0–15.0)
LYMPHS ABS: 3.3 10*3/uL (ref 0.7–4.0)
LYMPHS PCT: 42 %
MCH: 29.6 pg (ref 26.0–34.0)
MCHC: 33.7 g/dL (ref 30.0–36.0)
MCV: 88 fL (ref 78.0–100.0)
MONO ABS: 0.6 10*3/uL (ref 0.1–1.0)
Monocytes Relative: 7 %
NEUTROS ABS: 3.8 10*3/uL (ref 1.7–7.7)
Neutrophils Relative %: 49 %
PLATELETS: 319 10*3/uL (ref 150–400)
RBC: 4.25 MIL/uL (ref 3.87–5.11)
RDW: 14 % (ref 11.5–15.5)
WBC: 7.9 10*3/uL (ref 4.0–10.5)

## 2015-09-26 LAB — URINALYSIS, ROUTINE W REFLEX MICROSCOPIC
GLUCOSE, UA: NEGATIVE mg/dL
Nitrite: NEGATIVE
Specific Gravity, Urine: >1.03 — ABNORMAL HIGH (ref 1.005–1.030)
pH: 6 (ref 5.0–8.0)

## 2015-09-26 LAB — URINE MICROSCOPIC-ADD ON

## 2015-09-26 LAB — LIPASE, BLOOD: LIPASE: 45 U/L (ref 11–51)

## 2015-09-26 LAB — AMYLASE: Amylase: 50 U/L (ref 28–100)

## 2015-09-26 LAB — PREGNANCY, URINE: PREG TEST UR: NEGATIVE

## 2015-09-26 MED ORDER — HYDROMORPHONE HCL 1 MG/ML IJ SOLN
1.0000 mg | Freq: Once | INTRAMUSCULAR | Status: AC
  Administered 2015-09-26: 1 mg via INTRAVENOUS
  Filled 2015-09-26: qty 1

## 2015-09-26 MED ORDER — HYDROMORPHONE HCL 1 MG/ML IJ SOLN
0.5000 mg | Freq: Once | INTRAMUSCULAR | Status: AC
  Administered 2015-09-26: 0.5 mg via INTRAVENOUS
  Filled 2015-09-26: qty 1

## 2015-09-26 MED ORDER — ONDANSETRON HCL 4 MG/2ML IJ SOLN
4.0000 mg | Freq: Once | INTRAMUSCULAR | Status: AC
  Administered 2015-09-26: 4 mg via INTRAVENOUS
  Filled 2015-09-26: qty 2

## 2015-09-26 MED ORDER — HYDROCODONE-ACETAMINOPHEN 5-325 MG PO TABS: 1.0000 | ORAL_TABLET | Freq: Four times a day (QID) | ORAL | Status: DC | PRN

## 2015-09-26 MED ORDER — SODIUM CHLORIDE 0.9 % IV BOLUS (SEPSIS)
1000.0000 mL | Freq: Once | INTRAVENOUS | Status: AC
  Administered 2015-09-26: 1000 mL via INTRAVENOUS

## 2015-09-26 MED ORDER — IOHEXOL 300 MG/ML  SOLN
100.0000 mL | Freq: Once | INTRAMUSCULAR | Status: AC | PRN
  Administered 2015-09-26: 100 mL via INTRAVENOUS

## 2015-09-26 MED ORDER — CIPROFLOXACIN IN D5W 400 MG/200ML IV SOLN
400.0000 mg | Freq: Once | INTRAVENOUS | Status: AC
Start: 2015-09-26 — End: 2015-09-26
  Administered 2015-09-26: 400 mg via INTRAVENOUS
  Filled 2015-09-26: qty 200

## 2015-09-26 MED ORDER — CIPROFLOXACIN HCL 500 MG PO TABS: 500.0000 mg | ORAL_TABLET | Freq: Two times a day (BID) | ORAL | Status: DC

## 2015-09-26 MED ORDER — ONDANSETRON 4 MG PO TBDP: ORAL_TABLET | ORAL | Status: DC

## 2015-09-26 MED ORDER — KETOROLAC TROMETHAMINE 30 MG/ML IJ SOLN: 30.0000 mg | Freq: Once | INTRAMUSCULAR | Status: DC

## 2015-09-26 MED ORDER — DIATRIZOATE MEGLUMINE & SODIUM 66-10 % PO SOLN
ORAL | Status: AC
  Administered 2015-09-26: 30 mL
  Filled 2015-09-26: qty 30

## 2015-09-26 NOTE — ED Notes (Signed)
In CT

## 2015-09-26 NOTE — ED Notes (Signed)
Patient ambulatory to restroom with steady gait.

## 2015-09-26 NOTE — ED Notes (Signed)
Patient reports pain is starting to increase and no relief of nausea. Dr. Estell Harpin made aware. New orders given.

## 2015-09-26 NOTE — ED Provider Notes (Addendum)
CSN: [id number]     Arrival date & time 09/26/15  1836 History   First MD Initiated Contact with Patient 09/26/15 1945     Chief Complaint  Patient presents with  . Abdominal Pain     (Consider location/radiation/quality/duration/timing/severity/associated sxs/prior Treatment) Patient is a 45 y.o. female presenting with abdominal pain. The history is provided by the patient (Patient complains of lower abdominal pain and right flank pain).  Abdominal Pain Pain location:  Suprapubic Pain quality: aching   Pain radiation: radiates to right flank. Pain severity:  Moderate Onset quality:  Sudden Timing:  Constant Progression:  Worsening Chronicity:  New Associated symptoms: no chest pain, no cough, no diarrhea, no fatigue and no hematuria     Past Medical History  Diagnosis Date  . Hypertension   . Chronic back pain   . Sciatic pain     right  . Rib pain on right side     chronic  . Chronic neck pain   . Cervical radiculopathy   . Chronic knee pain   . Rheumatoid arthritis (HCC)     "Rhematoid factor positive but no symptoms" per EPIC notes 2012  . Fibromyalgia   . C. difficile diarrhea   . Diabetes mellitus without complication (HCC)     diet controlled   Past Surgical History  Procedure Laterality Date  . Tubal ligation    . Bladder surgery     Family History  Problem Relation Age of Onset  . Cancer Mother   . Diabetes Mother   . Colon cancer Father     AFTER AGE 12  . Crohn's disease Sister    Social History  Substance Use Topics  . Smoking status: Never Smoker   . Smokeless tobacco: Never Used  . Alcohol Use: No   OB History    Gravida Para Term Preterm AB TAB SAB Ectopic Multiple Living   3 3 3       3      Review of Systems  Constitutional: Negative for appetite change and fatigue.  HENT: Negative for congestion, ear discharge and sinus pressure.   Eyes: Negative for discharge.  Respiratory: Negative for cough.   Cardiovascular: Negative for chest  pain.  Gastrointestinal: Positive for abdominal pain. Negative for diarrhea.  Genitourinary: Positive for flank pain. Negative for frequency and hematuria.  Musculoskeletal: Negative for back pain.  Skin: Negative for rash.  Neurological: Negative for seizures and headaches.  Psychiatric/Behavioral: Negative for hallucinations.      Allergies  Mango flavor; Penicillins; Tramadol; Bactrim; Keflex; and Toradol  Home Medications   Prior to Admission medications   Medication Sig Start Date End Date Taking? Authorizing Provider  lisinopril-hydrochlorothiazide (PRINZIDE,ZESTORETIC) 20-25 MG per tablet Take 2 tablets by mouth every morning.    Yes Historical Provider, MD  venlafaxine XR (EFFEXOR-XR) 75 MG 24 hr capsule Take 225 mg by mouth at bedtime.    Yes Historical Provider, MD  cholestyramine Lanetta Inch) 4 G packet Take 0.5 packets (2 g total) by mouth daily. Patient not taking: Reported on 09/26/2015 05/23/15   Nira Retort, NP  ciprofloxacin (CIPRO) 500 MG tablet Take 1 tablet (500 mg total) by mouth 2 (two) times daily. One po bid x 7 days 09/26/15   Bethann Berkshire, MD  diclofenac (VOLTAREN) 50 MG EC tablet Take 1 tablet (50 mg total) by mouth 2 (two) times daily. Patient not taking: Reported on 09/26/2015 08/29/15   Janne Napoleon, NP  dicyclomine (BENTYL) 20 MG tablet Take 1  tablet (20 mg total) by mouth 2 (two) times daily. Patient not taking: Reported on 05/26/2015 05/21/15   Azalia Bilis, MD  diphenoxylate-atropine (LOMOTIL) 2.5-0.025 MG per tablet Take 2 tablets by mouth 4 (four) times daily as needed for diarrhea or loose stools. Patient not taking: Reported on 05/09/2015 05/04/15   Gilda Crease, MD  Eluxadoline (VIBERZI) 100 MG TABS Take 100 mg by mouth 2 (two) times daily. Patient not taking: Reported on 09/26/2015 06/06/15   West Bali, MD  HYDROcodone-acetaminophen (NORCO/VICODIN) 5-325 MG tablet Take 1 tablet by mouth every 6 (six) hours as needed. 09/26/15   Bethann Berkshire, MD   hyoscyamine (LEVSIN SL) 0.125 MG SL tablet Place 1 tablet (0.125 mg total) under the tongue every 4 (four) hours as needed. Patient not taking: Reported on 09/26/2015 06/04/15   Nira Retort, NP  ondansetron (ZOFRAN ODT) 4 MG disintegrating tablet 4mg  ODT q4 hours prn nausea/vomit 09/26/15   09/28/15, MD  ondansetron (ZOFRAN) 4 MG tablet Take 1 tablet (4 mg total) by mouth every 6 (six) hours as needed for nausea or vomiting. Patient not taking: Reported on 05/21/2015 05/09/15   07/09/15, MD  predniSONE (DELTASONE) 10 MG tablet 6, 5, 4, 3, 2 then 1 tablet by mouth daily for 6 days total. Patient not taking: Reported on 09/26/2015 08/11/15   14/4/16, PA-C  promethazine (PHENERGAN) 25 MG tablet Take 1 tablet (25 mg total) by mouth every 6 (six) hours as needed for nausea or vomiting. Patient not taking: Reported on 05/21/2015 05/04/15   05/06/15, MD  promethazine (PHENERGAN) 25 MG tablet Take 1 tablet (25 mg total) by mouth every 6 (six) hours as needed for nausea or vomiting. Patient not taking: Reported on 09/26/2015 05/26/15   Courteney Lyn Mackuen, MD  vancomycin (VANCOCIN) 250 MG capsule 1 PO Q6H FOR 7 DAYS THEN PO Q8H FOR 7 DAYS THEN PO Q12H FOR 7 DAYS THEN PO QD FOR 7 DAYS Patient not taking: Reported on 09/26/2015 05/15/15   07/15/15, MD   BP 120/62 mmHg  Pulse 79  Temp(Src) 98 F (36.7 C) (Oral)  Resp 16  Ht 5' (1.524 m)  Wt 194 lb (87.998 kg)  BMI 37.89 kg/m2  SpO2 96%  LMP 09/12/2015 Physical Exam  Constitutional: She is oriented to person, place, and time. She appears well-developed.  HENT:  Head: Normocephalic.  Eyes: Conjunctivae and EOM are normal. No scleral icterus.  Neck: Neck supple. No thyromegaly present.  Cardiovascular: Normal rate and regular rhythm.  Exam reveals no gallop and no friction rub.   No murmur heard. Pulmonary/Chest: No stridor. She has no wheezes. She has no rales. She exhibits no tenderness.  Abdominal: She exhibits no  distension. There is tenderness. There is no rebound.  Suprapubic tenderness  Genitourinary:  Tender right flank  Musculoskeletal: Normal range of motion. She exhibits no edema.  Lymphadenopathy:    She has no cervical adenopathy.  Neurological: She is oriented to person, place, and time. She exhibits normal muscle tone. Coordination normal.  Skin: No rash noted. No erythema.  Psychiatric: She has a normal mood and affect. Her behavior is normal.    ED Course  Procedures (including critical care time) Labs Review Labs Reviewed  COMPREHENSIVE METABOLIC PANEL - Abnormal; Notable for the following:    Potassium 3.3 (*)    Glucose, Bld 104 (*)    Alkaline Phosphatase 30 (*)    All other components within normal limits  URINALYSIS, ROUTINE W REFLEX MICROSCOPIC (NOT AT [facility]) - Abnormal; Notable for the following:    APPearance HAZY (*)    Specific Gravity, Urine >1.030 (*)    Hgb urine dipstick TRACE (*)    Bilirubin Urine SMALL (*)    Ketones, ur TRACE (*)    Protein, ur TRACE (*)    Leukocytes, UA SMALL (*)    All other components within normal limits  URINE MICROSCOPIC-ADD ON - Abnormal; Notable for the following:    Squamous Epithelial / LPF 6-30 (*)    Bacteria, UA MANY (*)    All other components within normal limits  URINE CULTURE  AMYLASE  CBC WITH DIFFERENTIAL/PLATELET  LIPASE, BLOOD  PREGNANCY, URINE    Imaging Review Ct Abdomen Pelvis W Contrast  09/26/2015  CLINICAL DATA:  45 year old female with history of fever and abdominal pain. Nausea. EXAM: CT ABDOMEN AND PELVIS WITH CONTRAST TECHNIQUE: Multidetector CT imaging of the abdomen and pelvis was performed using the standard protocol following bolus administration of intravenous contrast. CONTRAST:  OMNIPAQUE IOHEXOL 300 MG/ML  SOLN COMPARISON:  CT the abdomen and pelvis 05/15/2015. FINDINGS: Lower chest:  Unremarkable. Hepatobiliary: No cystic or solid hepatic lesions. No intra or extrahepatic biliary ductal  dilatation. Tiny calcified gallstone lying dependently in the gallbladder. No findings to suggest an acute cholecystitis at this time. Pancreas: No pancreatic mass. No pancreatic ductal dilatation. No pancreatic or peripancreatic fluid or inflammatory changes. Spleen: Unremarkable. Adrenals/Urinary Tract: 11 mm low-attenuation lesion in the lower pole of the left kidney is compatible with a small simple cyst. Right kidney and bilateral adrenal glands are normal in appearance. No hydroureteronephrosis. Urinary bladder is normal in appearance. 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 Stomach/Bowel: Normal appearance of the stomach. No pathologic dilatation of small bowel or colon. Normal appendix. Vascular/Lymphatic: Atherosclerosis in the abdominal and pelvic vasculature, without evidence of aneurysm or dissection. No lymphadenopathy noted in the abdomen or pelvis. Reproductive: 1.5 cm low-intermediate attenuation lesion in the cervix likely a Nabothian cyst. Ovaries are unremarkable in appearance. Other: Trace volume of free fluid in the cul-de-sac, presumably physiologic. No larger volume of ascites. No pneumoperitoneum. Musculoskeletal: There are no aggressive appearing lytic or blastic lesions noted in the visualized portions of the skeleton. IMPRESSION: 1. No acute findings in the abdomen or pelvis. 2. Normal appendix. 3. 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 4. Cholelithiasis without evidence of acute cholecystitis at this time. 5. Trace volume of free fluid in the cul-de-sac, presumably physiologic. 6. Additional incidental findings, as above. Electronically Signed   By: Trudie Reed M.D.   On: 09/26/2015 21:24   I have personally reviewed and evaluated these images and lab results as part of my medical decision-making.   EKG Interpretation None      MDM   Final diagnoses:  UTI (lower  urinary tract infection)    Patient has urinary tract infection and possible pyelonephritis.  Will treat with Cipro Vicodin and Zofran and she will follow-up next week   Bethann Berkshire, MD 09/26/15 0355  Bethann Berkshire, MD 09/26/15 2242

## 2015-09-29 LAB — URINE CULTURE
Culture: >=100000
Special Requests: NORMAL

## 2015-09-30 ENCOUNTER — Telehealth (HOSPITAL_COMMUNITY): Payer: Self-pay

## 2015-09-30 NOTE — Telephone Encounter (Signed)
Post ED Visit - Positive Culture Follow-up  Culture report reviewed by antimicrobial stewardship pharmacist:  []  , Pharm.D. []  Enzo Bi, Pharm.D., BCPS []  , Pharm.D. []  Celedonio Miyamoto, .D., BCPS []  [place], .D., BCPS, AAHIVP []  Georgina Pillion, Pharm.D., BCPS, AAHIVP []  [address], Pharm.D. []  , [place].[address].D.   Positive urine culture. >/= 100,000 colonies -> Proteus Vulgaris Treated with Ciprofloxacin, organism sensitive to the same and no further patient follow-up is required at this time.   09/30/2015, 8:35 PM

## 2015-10-07 ENCOUNTER — Emergency Department (HOSPITAL_COMMUNITY)
Admission: EM | Admit: 2015-10-07 | Discharge: 2015-10-07 | Disposition: A | Discharge status: Home / Self Care | Payer: Medicare Other | Attending: Emergency Medicine | Admitting: Emergency Medicine

## 2015-10-07 ENCOUNTER — Emergency Department (HOSPITAL_COMMUNITY): Payer: Medicare Other

## 2015-10-07 ENCOUNTER — Encounter (HOSPITAL_COMMUNITY): Payer: Self-pay | Admitting: Emergency Medicine

## 2015-10-07 DIAGNOSIS — I10 Essential (primary) hypertension: Secondary | ICD-10-CM | POA: Insufficient documentation

## 2015-10-07 DIAGNOSIS — Z3202 Encounter for pregnancy test, result negative: Secondary | ICD-10-CM | POA: Insufficient documentation

## 2015-10-07 DIAGNOSIS — Z880 Allergy status to penicillin: Secondary | ICD-10-CM | POA: Insufficient documentation

## 2015-10-07 DIAGNOSIS — G8929 Other chronic pain: Secondary | ICD-10-CM | POA: Insufficient documentation

## 2015-10-07 DIAGNOSIS — Z8739 Personal history of other diseases of the musculoskeletal system and connective tissue: Secondary | ICD-10-CM | POA: Insufficient documentation

## 2015-10-07 DIAGNOSIS — N390 Urinary tract infection, site not specified: Secondary | ICD-10-CM | POA: Diagnosis not present

## 2015-10-07 DIAGNOSIS — E119 Type 2 diabetes mellitus without complications: Secondary | ICD-10-CM | POA: Diagnosis not present

## 2015-10-07 DIAGNOSIS — R109 Unspecified abdominal pain: Secondary | ICD-10-CM | POA: Diagnosis present

## 2015-10-07 LAB — BASIC METABOLIC PANEL
Anion gap: 7 (ref 5–15)
BUN: 9 mg/dL (ref 6–20)
CHLORIDE: 106 mmol/L (ref 101–111)
CO2: 24 mmol/L (ref 22–32)
CREATININE: 0.67 mg/dL (ref 0.44–1.00)
Calcium: 8.7 mg/dL — ABNORMAL LOW (ref 8.9–10.3)
GFR calc Af Amer: >60 mL/min (ref >60)
GLUCOSE: 92 mg/dL (ref 65–99)
Potassium: 3.9 mmol/L (ref 3.5–5.1)
SODIUM: 137 mmol/L (ref 135–145)

## 2015-10-07 LAB — URINE MICROSCOPIC-ADD ON: RBC / HPF: NONE SEEN RBC/hpf (ref 0–5)

## 2015-10-07 LAB — CBC WITH DIFFERENTIAL/PLATELET
BASOS PCT: 0 %
Basophils Absolute: 0 10*3/uL (ref 0.0–0.1)
EOS ABS: 0.1 10*3/uL (ref 0.0–0.7)
Eosinophils Relative: 2 %
HCT: 38.7 % (ref 36.0–46.0)
Hemoglobin: 13 g/dL (ref 12.0–15.0)
LYMPHS ABS: 2.9 10*3/uL (ref 0.7–4.0)
Lymphocytes Relative: 37 %
MCH: 29.2 pg (ref 26.0–34.0)
MCHC: 33.6 g/dL (ref 30.0–36.0)
MCV: 87 fL (ref 78.0–100.0)
MONO ABS: 0.4 10*3/uL (ref 0.1–1.0)
MONOS PCT: 5 %
Neutro Abs: 4.5 10*3/uL (ref 1.7–7.7)
Neutrophils Relative %: 56 %
Platelets: 306 10*3/uL (ref 150–400)
RBC: 4.45 MIL/uL (ref 3.87–5.11)
RDW: 13.8 % (ref 11.5–15.5)
WBC: 8 10*3/uL (ref 4.0–10.5)

## 2015-10-07 LAB — URINALYSIS, ROUTINE W REFLEX MICROSCOPIC
BILIRUBIN URINE: NEGATIVE
GLUCOSE, UA: NEGATIVE mg/dL
HGB URINE DIPSTICK: NEGATIVE
KETONES UR: NEGATIVE mg/dL
Nitrite: POSITIVE — AB
PROTEIN: NEGATIVE mg/dL
Specific Gravity, Urine: 1.025 (ref 1.005–1.030)
pH: 6 (ref 5.0–8.0)

## 2015-10-07 LAB — PREGNANCY, URINE: Preg Test, Ur: NEGATIVE

## 2015-10-07 LAB — LACTIC ACID, PLASMA: LACTIC ACID, VENOUS: 0.9 mmol/L (ref 0.5–2.0)

## 2015-10-07 MED ORDER — FENTANYL CITRATE (PF) 100 MCG/2ML IJ SOLN
100.0000 ug | Freq: Once | INTRAMUSCULAR | Status: AC
  Administered 2015-10-07: 100 ug via INTRAMUSCULAR
  Filled 2015-10-07: qty 2

## 2015-10-07 MED ORDER — CIPROFLOXACIN HCL 500 MG PO TABS: 500.0000 mg | ORAL_TABLET | Freq: Two times a day (BID) | ORAL | Status: DC

## 2015-10-07 MED ORDER — HYDROCODONE-ACETAMINOPHEN 5-325 MG PO TABS: ORAL_TABLET | ORAL | Status: DC

## 2015-10-07 MED ORDER — ONDANSETRON 4 MG PO TBDP
4.0000 mg | ORAL_TABLET | Freq: Once | ORAL | Status: AC
  Administered 2015-10-07: 4 mg via ORAL
  Filled 2015-10-07: qty 1

## 2015-10-07 MED ORDER — CIPROFLOXACIN HCL 250 MG PO TABS
500.0000 mg | ORAL_TABLET | Freq: Once | ORAL | Status: AC
  Administered 2015-10-07: 500 mg via ORAL
  Filled 2015-10-07: qty 2

## 2015-10-07 MED ORDER — OXYCODONE-ACETAMINOPHEN 5-325 MG PO TABS
2.0000 | ORAL_TABLET | Freq: Once | ORAL | Status: AC
  Administered 2015-10-07: 2 via ORAL
  Filled 2015-10-07: qty 2

## 2015-10-07 MED ORDER — PROMETHAZINE HCL 25 MG PO TABS: 25.0000 mg | ORAL_TABLET | Freq: Four times a day (QID) | ORAL | Status: DC | PRN

## 2015-10-07 NOTE — ED Notes (Signed)
Pt complain of nausea

## 2015-10-07 NOTE — Discharge Instructions (Signed)
°Emergency Department Resource Guide °1) Find a Doctor and Pay Out of Pocket °Although you won't have to find out who is covered by your insurance plan, it is a good idea to ask around and get recommendations. You will then need to call the office and see if the doctor you have chosen will accept you as a new patient and what types of options they offer for patients who are self-pay. Some doctors offer discounts or will set up payment plans for their patients who do not have insurance, but you will need to ask so you aren't surprised when you get to your appointment. ° °2) Contact Your Local Health Department °Not all health departments have doctors that can see patients for sick visits, but many do, so it is worth a call to see if yours does. If you don't know where your local health department is, you can check in your phone book. The CDC also has a tool to help you locate your state's health department, and many state websites also have listings of all of their local health departments. ° °3) Find a Walk-in Clinic °If your illness is not likely to be very severe or complicated, you may want to try a walk in clinic. These are popping up all over the country in pharmacies, drugstores, and shopping centers. They're usually staffed by nurse practitioners or physician assistants that have been trained to treat common illnesses and complaints. They're usually fairly quick and inexpensive. However, if you have serious medical issues or chronic medical problems, these are probably not your best option. ° °No Primary Care Doctor: °- Call [facility] at  832-8000 - they can help you locate a primary care doctor that  accepts your insurance, provides certain services, etc. °- Physician Referral Service- 1-800-533-3463 ° °Chronic Pain Problems: °Organization         Address  Phone   Notes  °[facility]  (336) [phone] Patients need to be referred by their primary care doctor.  ° °Medication  Assistance: °Organization         Address  Phone   Notes  °[place] [facility] [address] °[place], [place] [address] (336) 641-8030 --Must be a resident of [place] °-- Must have NO insurance coverage whatsoever (no Medicaid/ Medicare, etc.) °-- The pt. MUST have a primary care doctor that directs their care regularly and follows them in the community °  °[facility]  (866) 331-1348   °[employer]  (888) 892-1162   ° °Agencies that provide inexpensive medical care: °Organization         Address  Phone   Notes  °[facility]  (336) 832-8035   °[facility]    (336) 832-7272   °[facility] [address] °[place], [place] [address] (336) 832-4777   °[facility] [address], °[place] (336) 271-4999   °[facility]    (336) 373-0678   °[facility]    (336) 272-1050   °[facility] ° [address], [place] Phone:  (336) 832-4444, Fax:  (336) 832-4440 Hours of Operation:  9 am - 6 pm, M-F.  Also accepts Medicaid/Medicare and self-pay.  °[facility] ° [address], [place] Phone: (336) [phone], Fax: (336) [phone]. Hours of Operation:  8:30 am - 5:30 pm, M-F.  Also accepts Medicaid and self-pay.  °[facility] [place] [address], [place] Phone: (336) 878-6027   °[facility] [address], [place], [place] (336)723-1848, Ext. 123 Mondays & Thursdays: 7-9 AM.  First 15 patients are seen on a first come, first serve basis. °  ° °Medicaid-accepting [place] Providers: ° °Organization         Address  Phone   Notes  °[facility] [address], [place] (336) 641-2100 Also accepts self-pay patients.  °[facility] [address], [place] ° (336) 856-9996   °[facility] [address], [place]  (336) 288-8857   °[facility] [address] [place] [address], [place] (336) 299-7000   °Veita Bland [address], [place]  ° (336) 373-1557 Only accepts [place] Access Medicaid patients after they have their name applied to their card.  ° °Self-Pay (no insurance) in [place]: ° °Organization         Address  Phone   Notes  °Sickle Cell Patients, [facility] [address], [place] (336) 832-1970   °[facility] [address], [place] (336) 832-4400   °[facility] ° [address], [place] (336) 992-4800   °[facility]/Dr. Osei-Bonsu ° [address] [place] [address], [place] or [address], [place] (336) 841-8500 Phone number for both [place] and [place] locations is the same.  °[facility] [address], [place] (336) 299-0000   °[facility] [address] [place] [address], [place] or [address] (336) 852-7530 °(336) 878-2260   °[facility] [address], [place] (336) 350-1642, phone; (336) 294-5005, fax Sees patients 1st and 3rd Saturday of every month.  Must not qualify for public or private insurance (i.e. Medicaid, Medicare, [place] Health Choice, Veterans' Benefits) • Household income should be no more than 200% of the poverty level •The clinic cannot treat you if you are pregnant or think you are pregnant • Sexually transmitted diseases are not treated at the clinic.  ° ° °Dental Care: °Organization         Address  Phone  Notes  °[place] [facility] [address], [place] (336) 641-6152 Accepts children up to age 21 who are enrolled in Medicaid or [place] Health Choice; pregnant women with a Medicaid card; and children who have applied for Medicaid or [place] Health Choice, but were declined, whose parents can pay a reduced fee at time of service.  °[place]  [facility] [place]  [address], [place] (336) 641-7733 Accepts children up to age 21 who are enrolled in Medicaid or [place] Health Choice; pregnant women with a Medicaid card; and children who have applied for Medicaid or [place] Health Choice, but were declined, whose parents can pay a reduced fee at time of service.  °[facility] ° [address], [place] (336) 641-4533 Patients are seen by appointment only. Walk-ins are not accepted. [facility] will see patients 18 years of age and older. °Monday - Tuesday (8am-5pm) °Most Wednesdays (8:30-5pm) °$30 per visit, cash only  °[facility] ° [address], [place] (336) 641-4533 Patients are seen by appointment only. Walk-ins are not accepted. [facility] will see patients 18 years of age and older. °One   Wednesday Evening (Monthly: Volunteer Based).  $30 per visit, cash only  °[facility]  (919) [phone] for adults; Children under age 4, call Graduate Pediatric Dentistry at ([phone]) [phone]. Children aged 4-14, please call ([phone]) [phone] to request a pediatric application. ° Dental services are provided in all areas of dental care including fillings, crowns and bridges, complete and partial dentures, implants, gum treatment, root canals, and extractions. Preventive care is also provided. Treatment is provided to both adults and children. °Patients are selected via a lottery and there is often a waiting list. °  °[facility] [address], °[place] ° (336) 763-8833 [email or web address] °  °[facility] [address], [place], [place] (336)723-1848, Ext. 123 Second and Fourth Thursday of each month, opens at 6:30 AM; Clinic ends at 9 AM.  Patients are seen on a first-come first-served basis, and a limited number are seen during each clinic.  ° °[facility] ° [address], [place], [place] (336) 723-7904    Eligibility Requirements °You must have lived in [place], [place], or [place] counties for at least the last three months. °  You cannot be eligible for state or federal sponsored healthcare insurance, including Veterans Administration, Medicaid, or Medicare. °  You generally cannot be eligible for healthcare insurance through your employer.  °  How to apply: °Eligibility screenings are held every Tuesday and Wednesday afternoon from 1:00 pm until 4:00 pm. You do not need an appointment for the interview!  °[facility] [address], [place], [place] 336-631-2330   °[facility]  336-342-8273   °[facility]  336-703-3100   °[facility]  336-570-6415   ° °Behavioral Health Resources in the Community: °Intensive Outpatient Programs °Organization         Address  Phone  Notes  °[facility] [address], [place], [place] 336-878-6098   °[facility] [address], [place], [place] 336-832-9800   °[facility] [address], [place], [place] ° 336-882-2125   °[facility] [address],  °[place], [place] 1-800-853-5163 or 336-641-4981   °Substance Abuse Resources °Organization         Address  Phone  Notes  °[facility]  336-882-2125   °[facility]  336-784-9470   °[facility]  336-285-9073   °[facility]  336-845-3988   °Residential & Outpatient Substance Abuse Program  1-800-659-3381   °Psychological Services °Organization         Address  Phone  Notes  °[facility]  336- 832-9600   °[facility]  336- 378-7881   °[facility] [address], [place] 1-800-853-5163 or 336-641-4981   ° °Mobile Crisis Teams °Organization         Address  Phone  Notes  °[facility], Mobile Crisis Care Unit  1-877-626-1772   °Assertive °[facility] ° [address]  [place], [place] 336-834-9664   °Sharon DeEsch [address] °[place] 336-554-5454   ° °Self-Help/Support Groups °Organization         Address  Phone             Notes  °[facility]  - variety of support groups  336- 373-1402 Call for more information  °Narcotics Anonymous (NA), [facility] [address], °[place]  2 meetings at this location  ° °  Residential Treatment Programs Organization         Address  Phone  Notes  [facility] [address]    [place]  8-003-491-7915   [facility]  [address] [place] [address], [place], [place] 480-165-5374   [facility] [address] [place], [place] 827-078-6754 Admissions: 8am-3pm M-F  [facility] [address],    [place], [place] 492-010-0712   [facility] [address] [place], [place], [place] 197-588-3254   [facility] [address],  [place], [place] 982-641-5830   [facility] Intensive Outpatient [address], [place], [place] 940-768-0881   [facility] [address] [place]  [place], [place] 1-031-594-5859 or 250 242 4547   [facility] [address], [place], [place] 817-711-6579 Accepts Medicaid  [facility] [place] [address],  [place] 0-383-338-3291 Substance Abuse/Addiction Treatment   [facility] Organization         Address  Phone  Notes  [facility]  [phone]   Angie Fava, PhD [address] [place]vin Knack [place], [place]   6174082653 or (810)068-7092   [facility]   [address] [place], [place] (743)221-0983   [facility] [address], [place], [place] 775-600-2857 Insurance/Medicaid/sponsorship through [facility] [address] [place] [address]                                    [place], [place] (440) [phone] Therapy/tele-psych/case    [facility] [address], [place] (703)[phone]    Dr. Lolly Mustache  567-128-7126   [facility]  [employer] [facility] Dept. 1) [address] [place] 2) [address], [place] 3)  [address], [place] 340-170-0245 (586)[phone]  515-448-4217   [facility] Child Abuse Hotline (805)887-2821 or 507-424-7266 (After Hours)      Take the prescriptions as directed.  Call your regular medical doctor and the Urologist tomorrow to schedule a follow up appointment within the next 3 days.  Return to the Emergency Department immediately sooner if worsening.

## 2015-10-07 NOTE — ED Provider Notes (Signed)
CSN: [id number]     Arrival date & time 10/07/15  1313 History   First MD Initiated Contact with Patient 10/07/15 1354     Chief Complaint  Patient presents with  . Flank Pain      HPI Pt was seen at 1410. Per pt, c/o gradual onset and persistence of constant dysuria since yesterday. Has been associated with right sided LBP, nausea, and subjective home fevers/chills.  Denies objective fevers, no abd pain, no vomiting/diarrhea, no vaginal bleeding/discharge, no rash.   Past Medical History  Diagnosis Date  . Hypertension   . Chronic back pain   . Sciatic pain     right  . Rib pain on right side     chronic  . Chronic neck pain   . Cervical radiculopathy   . Chronic knee pain   . Rheumatoid arthritis (HCC)     "Rhematoid factor positive but no symptoms" per EPIC notes 2012  . Fibromyalgia   . C. difficile diarrhea   . Diabetes mellitus without complication (HCC)     diet controlled   Past Surgical History  Procedure Laterality Date  . Tubal ligation    . Bladder surgery     Family History  Problem Relation Age of Onset  . Cancer Mother   . Diabetes Mother   . Colon cancer Father     AFTER AGE 57  . Crohn's disease Sister    Social History  Substance Use Topics  . Smoking status: Never Smoker   . Smokeless tobacco: Never Used  . Alcohol Use: No   OB History    Gravida Para Term Preterm AB TAB SAB Ectopic Multiple Living   3 3 3       3      Review of Systems ROS: Statement: All systems negative except as marked or noted in the HPI; Constitutional: +subjective fever and chills. ; ; Eyes: Negative for eye pain, redness and discharge. ; ; ENMT: Negative for ear pain, hoarseness, nasal congestion, sinus pressure and sore throat. ; ; Cardiovascular: Negative for chest pain, palpitations, diaphoresis, dyspnea and peripheral edema. ; ; Respiratory: Negative for cough, wheezing and stridor. ; ; Gastrointestinal: +nausea. Negative for vomiting, diarrhea, abdominal pain,  blood in stool, hematemesis, jaundice and rectal bleeding. . ; ; Genitourinary: +dysuria. Negative for hematuria. ; ; Musculoskeletal: +LBP. Negative for neck pain. Negative for swelling and trauma.; ; Skin: Negative for pruritus, rash, abrasions, blisters, bruising and skin lesion.; ; Neuro: Negative for headache, lightheadedness and neck stiffness. Negative for weakness, altered level of consciousness , altered mental status, extremity weakness, paresthesias, involuntary movement, seizure and syncope.      Allergies  Mango flavor; Penicillins; Tramadol; Bactrim; Keflex; and Toradol  Home Medications   Prior to Admission medications   Medication Sig Start Date End Date Taking? Authorizing Provider  acetaminophen (TYLENOL) 500 MG tablet Take 1,000 mg by mouth every 4 (four) hours as needed for moderate pain.   Yes Historical Provider, MD  lisinopril-hydrochlorothiazide (PRINZIDE,ZESTORETIC) 20-25 MG per tablet Take 2 tablets by mouth every morning.    Yes Historical Provider, MD  ondansetron (ZOFRAN ODT) 4 MG disintegrating tablet 4mg  ODT q4 hours prn nausea/vomit Patient taking differently: Take 4 mg by mouth every 4 (four) hours as needed for nausea or vomiting.  09/26/15  Yes , MD  venlafaxine XR (EFFEXOR-XR) 75 MG 24 hr capsule Take 225 mg by mouth at bedtime.    Yes Historical Provider, MD  cholestyramine 09/28/15) 4 G  packet Take 0.5 packets (2 g total) by mouth daily. Patient not taking: Reported on 09/26/2015 05/23/15   Nira Retort, NP  ciprofloxacin (CIPRO) 500 MG tablet Take 1 tablet (500 mg total) by mouth 2 (two) times daily. One po bid x 7 days Patient not taking: Reported on 10/07/2015 09/26/15   Bethann Berkshire, MD  diclofenac (VOLTAREN) 50 MG EC tablet Take 1 tablet (50 mg total) by mouth 2 (two) times daily. Patient not taking: Reported on 09/26/2015 08/29/15   Janne Napoleon, NP  dicyclomine (BENTYL) 20 MG tablet Take 1 tablet (20 mg total) by mouth 2 (two) times  daily. Patient not taking: Reported on 05/26/2015 05/21/15   Azalia Bilis, MD  diphenoxylate-atropine (LOMOTIL) 2.5-0.025 MG per tablet Take 2 tablets by mouth 4 (four) times daily as needed for diarrhea or loose stools. Patient not taking: Reported on 05/09/2015 05/04/15   Gilda Crease, MD  Eluxadoline (VIBERZI) 100 MG TABS Take 100 mg by mouth 2 (two) times daily. Patient not taking: Reported on 09/26/2015 06/06/15   [place] Bali, MD  HYDROcodone-acetaminophen (NORCO/VICODIN) 5-325 MG tablet Take 1 tablet by mouth every 6 (six) hours as needed. Patient not taking: Reported on 10/07/2015 09/26/15   Bethann Berkshire, MD  hyoscyamine (LEVSIN SL) 0.125 MG SL tablet Place 1 tablet (0.125 mg total) under the tongue every 4 (four) hours as needed. Patient not taking: Reported on 09/26/2015 06/04/15   Nira Retort, NP  ondansetron (ZOFRAN) 4 MG tablet Take 1 tablet (4 mg total) by mouth every 6 (six) hours as needed for nausea or vomiting. Patient not taking: Reported on 05/21/2015 05/09/15   [place] Bali, MD  predniSONE (DELTASONE) 10 MG tablet 6, 5, 4, 3, 2 then 1 tablet by mouth daily for 6 days total. Patient not taking: Reported on 09/26/2015 08/11/15   Burgess Amor, PA-C  promethazine (PHENERGAN) 25 MG tablet Take 1 tablet (25 mg total) by mouth every 6 (six) hours as needed for nausea or vomiting. Patient not taking: Reported on 05/21/2015 05/04/15   Gilda Crease, MD  promethazine (PHENERGAN) 25 MG tablet Take 1 tablet (25 mg total) by mouth every 6 (six) hours as needed for nausea or vomiting. Patient not taking: Reported on 09/26/2015 05/26/15   Courteney Lyn Mackuen, MD  vancomycin (VANCOCIN) 250 MG capsule 1 PO Q6H FOR 7 DAYS THEN PO Q8H FOR 7 DAYS THEN PO Q12H FOR 7 DAYS THEN PO QD FOR 7 DAYS Patient not taking: Reported on 09/26/2015 05/15/15   [place] Bali, MD   BP 148/93 mmHg  Pulse 100  Temp(Src) 98.5 F (36.9 C) (Oral)  Resp 16  Ht 5' (1.524 m)  Wt 195 lb (88.451 kg)  BMI 38.08  kg/m2  SpO2 97%  LMP 09/12/2015  BP 131/93 mmHg  Pulse 83  Temp(Src) 98.5 F (36.9 C) (Oral)  Resp 18  Ht 5' (1.524 m)  Wt 195 lb (88.451 kg)  BMI 38.08 kg/m2  SpO2 96%  LMP 09/12/2015  Physical Exam  1415: Physical examination:  Nursing notes reviewed; Vital signs and O2 SAT reviewed;  Constitutional: Well developed, Well nourished, Well hydrated, In no acute distress; Head:  Normocephalic, atraumatic; Eyes: EOMI, PERRL, No scleral icterus; ENMT: Mouth and pharynx normal, Mucous membranes moist; Neck: Supple, Full range of motion, No lymphadenopathy; Cardiovascular: Tachycardic rate and rhythm, No murmur, rub, or gallop; Respiratory: Breath sounds clear & equal bilaterally, No rales, rhonchi, wheezes.  Speaking full sentences with ease, Normal respiratory  effort/excursion; Chest: Nontender, Movement normal; Abdomen: Soft, Nontender, Nondistended, Normal bowel sounds; Genitourinary: No CVA tenderness; Spine:  No midline CS, TS, LS tenderness. +TTP right lumbar paraspinal muscles.;; Extremities: Pulses normal, No tenderness, No edema, No calf edema or asymmetry.; Neuro: AA&Ox3, Major CN grossly intact.  Speech clear. No gross focal motor or sensory deficits in extremities.; Skin: Color normal, Warm, Dry.   ED Course  Procedures (including critical care time) Labs Review  Imaging Review  I have personally reviewed and evaluated these images and lab results as part of my medical decision-making.   EKG Interpretation None      MDM  MDM Reviewed: previous chart, nursing note and vitals Reviewed previous: labs and CT scan Interpretation: labs and ultrasound     Results for orders placed or performed during the hospital encounter of 10/07/15  Urinalysis, Routine w reflex microscopic  Result Value Ref Range   Color, Urine YELLOW YELLOW   APPearance HAZY (A) CLEAR   Specific Gravity, Urine 1.025 1.005 - 1.030   pH 6.0 5.0 - 8.0   Glucose, UA NEGATIVE NEGATIVE mg/dL   Hgb urine  dipstick NEGATIVE NEGATIVE   Bilirubin Urine NEGATIVE NEGATIVE   Ketones, ur NEGATIVE NEGATIVE mg/dL   Protein, ur NEGATIVE NEGATIVE mg/dL   Nitrite POSITIVE (A) NEGATIVE   Leukocytes, UA SMALL (A) NEGATIVE  Pregnancy, urine  Result Value Ref Range   Preg Test, Ur NEGATIVE NEGATIVE  CBC with Differential  Result Value Ref Range   WBC 8.0 4.0 - 10.5 K/uL   RBC 4.45 3.87 - 5.11 MIL/uL   Hemoglobin 13.0 12.0 - 15.0 g/dL   HCT 05.6 97.9 - 48.0 %   MCV 87.0 78.0 - 100.0 fL   MCH 29.2 26.0 - 34.0 pg   MCHC 33.6 30.0 - 36.0 g/dL   RDW 16.5 53.7 - 48.2 %   Platelets 306 150 - 400 K/uL   Neutrophils Relative % 56 %   Neutro Abs 4.5 1.7 - 7.7 K/uL   Lymphocytes Relative 37 %   Lymphs Abs 2.9 0.7 - 4.0 K/uL   Monocytes Relative 5 %   Monocytes Absolute 0.4 0.1 - 1.0 K/uL   Eosinophils Relative 2 %   Eosinophils Absolute 0.1 0.0 - 0.7 K/uL   Basophils Relative 0 %   Basophils Absolute 0.0 0.0 - 0.1 K/uL  Lactic acid, plasma  Result Value Ref Range   Lactic Acid, Venous 0.9 0.5 - 2.0 mmol/L  Basic metabolic panel  Result Value Ref Range   Sodium 137 135 - 145 mmol/L   Potassium 3.9 3.5 - 5.1 mmol/L   Chloride 106 101 - 111 mmol/L   CO2 24 22 - 32 mmol/L   Glucose, Bld 92 65 - 99 mg/dL   BUN 9 6 - 20 mg/dL   Creatinine, Ser 7.07 0.44 - 1.00 mg/dL   Calcium 8.7 (L) 8.9 - 10.3 mg/dL   GFR calc non Af Amer >60 >60 mL/min   GFR calc Af Amer >60 >60 mL/min   Anion gap 7 5 - 15  Urine microscopic-add on  Result Value Ref Range   Squamous Epithelial / LPF 0-5 (A) NONE SEEN   WBC, UA TOO NUMEROUS TO COUNT 0 - 5 WBC/hpf   RBC / HPF NONE SEEN 0 - 5 RBC/hpf   Bacteria, UA MANY (A) NONE SEEN   Ct Abdomen Pelvis W Contrast 09/26/2015  CLINICAL DATA:  45 year old female with history of fever and abdominal pain. Nausea. EXAM: CT ABDOMEN AND  PELVIS WITH CONTRAST TECHNIQUE: Multidetector CT imaging of the abdomen and pelvis was performed using the standard protocol following bolus  administration of intravenous contrast. CONTRAST:  OMNIPAQUE IOHEXOL 300 MG/ML  SOLN COMPARISON:  CT the abdomen and pelvis 05/15/2015. FINDINGS: Lower chest:  Unremarkable. Hepatobiliary: No cystic or solid hepatic lesions. No intra or extrahepatic biliary ductal dilatation. Tiny calcified gallstone lying dependently in the gallbladder. No findings to suggest an acute cholecystitis at this time. Pancreas: No pancreatic mass. No pancreatic ductal dilatation. No pancreatic or peripancreatic fluid or inflammatory changes. Spleen: Unremarkable. Adrenals/Urinary Tract: 11 mm low-attenuation lesion in the lower pole of the left kidney is compatible with a small simple cyst. Right kidney and bilateral adrenal glands are normal in appearance. No hydroureteronephrosis. Urinary bladder is normal in appearance. 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 Stomach/Bowel: Normal appearance of the stomach. No pathologic dilatation of small bowel or colon. Normal appendix. Vascular/Lymphatic: Atherosclerosis in the abdominal and pelvic vasculature, without evidence of aneurysm or dissection. No lymphadenopathy noted in the abdomen or pelvis. Reproductive: 1.5 cm low-intermediate attenuation lesion in the cervix likely a Nabothian cyst. Ovaries are unremarkable in appearance. Other: Trace volume of free fluid in the cul-de-sac, presumably physiologic. No larger volume of ascites. No pneumoperitoneum. Musculoskeletal: There are no aggressive appearing lytic or blastic lesions noted in the visualized portions of the skeleton. IMPRESSION: 1. No acute findings in the abdomen or pelvis. 2. Normal appendix. 3. 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 4. Cholelithiasis without evidence of acute cholecystitis at this time. 5. Trace volume of free fluid in the cul-de-sac, presumably physiologic. 6. Additional incidental findings, as  above. Electronically Signed   By: Trudie Reed M.D.   On: 09/26/2015 21:24   US Renal 10/07/2015  CLINICAL DATA:  Right flank pain since yesterday, history of bladder tack EXAM: RENAL / URINARY TRACT ULTRASOUND COMPLETE COMPARISON:  09/26/2015 FINDINGS: Right Kidney: Length: 14.2 cm. Echogenicity within normal limits. No mass or hydronephrosis visualized. Left Kidney: Length: 14.9 cm. Echogenicity within normal limits. No mass or hydronephrosis visualized. There is an 18 mm lower pole cyst. Bladder: Bladder not very distended and therefore not well evaluated. There appears to be hyperechoic material within the bladder. Ureteral jets not identified. IMPRESSION: Hyperechoic material within the bladder could represent infectious debris, hemorrhage, or less likely, mass. Ureteral jets are not identified, although there is no evidence of hydronephrosis. Electronically Signed   By: Esperanza Heir M.D.   On: 10/07/2015 15:21    1725:  +UTI, UC pending; tx cipro given multiple pt's allergies. Workup otherwise reassuring with normal WBC count and lactic acid. Remains afebrile while in the ED with stable VS. Has tol PO well without N/V. Tx for UTI, f/u PMD and Uro MD. Dx and testing d/w pt.  Questions answered.  Verb understanding, agreeable to d/c home with outpt f/u.   Samuel Jester, DO 10/09/15 1301

## 2015-10-07 NOTE — ED Notes (Signed)
Pt reports right flank pain since yesterday with nausea.  Pt has pain with urination, fevers, chills.

## 2015-10-09 LAB — URINE CULTURE

## 2015-10-10 ENCOUNTER — Telehealth (HOSPITAL_COMMUNITY): Payer: Self-pay

## 2015-10-10 NOTE — Telephone Encounter (Signed)
Post ED Visit - Positive Culture Follow-up: Chart Hand-off to ED Flow Manager  Culture assessed and recommendations reviewed by: [x]  , Pharm.D., BCPS []  Isaac Bliss, .D., BCPS-AQ ID []  Celedonio Miyamoto, Pharm.D., BCPS []  [place], .D., BCPS, AAHIVP []  Georgina Pillion, Pharm .D., BCPS, AAHIVP []  , Pharm.D. []  [place], Pharm.D.  Positive urine culture  []  Patient discharged without antimicrobial prescription and treatment is now indicated [x]  Organism is resistant to prescribed ED discharge antimicrobial []  Patient with positive blood cultures  Changes discussed with ED provider: [place] New antibiotic prescription DC cipro and start macrobid 100 mg po bid x 1week  Unable to reach by phone. Letter sent to address on record.    10/10/2015, 9:36 AM

## 2015-10-10 NOTE — Progress Notes (Signed)
ED Antimicrobial Stewardship Positive Culture Follow Up   Regina Patton is an 45 y.o. female who presented to [facility] on 10/07/2015 with a chief complaint of  Chief Complaint  Patient presents with  . Flank Pain    Recent Results (from the past 720 hour(s))  Urine culture     Status: None   Collection Time: 09/26/15  8:16 PM  Result Value Ref Range Status   Specimen Description URINE, CLEAN CATCH  Final   Special Requests Normal  Final   Culture   Final    >=100,000 COLONIES/mL PROTEUS VULGARIS Performed at [facility]    Report Status 09/29/2015 FINAL  Final   Organism ID, Bacteria PROTEUS VULGARIS  Final      Susceptibility   Proteus vulgaris - MIC*    AMPICILLIN >=32 RESISTANT Resistant     CEFAZOLIN >=64 RESISTANT Resistant     CEFTRIAXONE <=1 SENSITIVE Sensitive     CIPROFLOXACIN <=0.25 SENSITIVE Sensitive     GENTAMICIN <=1 SENSITIVE Sensitive     IMIPENEM 4 SENSITIVE Sensitive     NITROFURANTOIN 64 RESISTANT Resistant     TRIMETH/SULFA <=20 SENSITIVE Sensitive     AMPICILLIN/SULBACTAM 8 SENSITIVE Sensitive     PIP/TAZO <=4 SENSITIVE Sensitive     * >=100,000 COLONIES/mL PROTEUS VULGARIS  Urine culture     Status: None   Collection Time: 10/07/15  2:07 PM  Result Value Ref Range Status   Specimen Description URINE, CLEAN CATCH  Final   Special Requests NONE  Final   Culture   Final    >=100,000 COLONIES/mL ESCHERICHIA COLI Performed at [facility]    Report Status 10/09/2015 FINAL  Final   Organism ID, Bacteria ESCHERICHIA COLI  Final      Susceptibility   Escherichia coli - MIC*    AMPICILLIN >=32 RESISTANT Resistant     CEFAZOLIN <=4 SENSITIVE Sensitive     CEFTRIAXONE <=1 SENSITIVE Sensitive     CIPROFLOXACIN >=4 RESISTANT Resistant     GENTAMICIN <=1 SENSITIVE Sensitive     IMIPENEM 0.5 SENSITIVE Sensitive     NITROFURANTOIN <=16 SENSITIVE Sensitive     TRIMETH/SULFA <=20 SENSITIVE Sensitive     AMPICILLIN/SULBACTAM 16 INTERMEDIATE  Intermediate     PIP/TAZO <=4 SENSITIVE Sensitive     * >=100,000 COLONIES/mL ESCHERICHIA COLI    [x]  Treated with ciprofloxacin, organism resistant to prescribed antimicrobial []  Patient discharged originally without antimicrobial agent and treatment is now indicated  New antibiotic prescription: Macrobid 100 mg BID x 1 week  ED Provider: , PA-C  10/10/2015, 8:09 AM Infectious Diseases Pharmacist Phone# [phone]

## 2015-10-12 ENCOUNTER — Emergency Department (HOSPITAL_COMMUNITY)
Admission: EM | Admit: 2015-10-12 | Discharge: 2015-10-12 | Disposition: A | Discharge status: Home / Self Care | Payer: Medicare Other | Attending: Emergency Medicine | Admitting: Emergency Medicine

## 2015-10-12 ENCOUNTER — Emergency Department (HOSPITAL_COMMUNITY)
Admission: EM | Admit: 2015-10-12 | Discharge: 2015-10-12 | Disposition: A | Discharge status: Home / Self Care | Payer: Medicare Other | Source: Home / Self Care | Attending: Emergency Medicine | Admitting: Emergency Medicine

## 2015-10-12 ENCOUNTER — Emergency Department (HOSPITAL_COMMUNITY): Payer: Medicare Other

## 2015-10-12 ENCOUNTER — Inpatient Hospital Stay (HOSPITAL_COMMUNITY): Admit: 2015-10-12 | Payer: Medicare Other

## 2015-10-12 ENCOUNTER — Encounter (HOSPITAL_COMMUNITY): Payer: Self-pay | Admitting: Emergency Medicine

## 2015-10-12 ENCOUNTER — Encounter (HOSPITAL_COMMUNITY): Payer: Self-pay | Admitting: *Deleted

## 2015-10-12 DIAGNOSIS — Z880 Allergy status to penicillin: Secondary | ICD-10-CM | POA: Insufficient documentation

## 2015-10-12 DIAGNOSIS — E119 Type 2 diabetes mellitus without complications: Secondary | ICD-10-CM | POA: Diagnosis not present

## 2015-10-12 DIAGNOSIS — Z9851 Tubal ligation status: Secondary | ICD-10-CM | POA: Insufficient documentation

## 2015-10-12 DIAGNOSIS — Z792 Long term (current) use of antibiotics: Secondary | ICD-10-CM | POA: Insufficient documentation

## 2015-10-12 DIAGNOSIS — R197 Diarrhea, unspecified: Secondary | ICD-10-CM | POA: Insufficient documentation

## 2015-10-12 DIAGNOSIS — R110 Nausea: Secondary | ICD-10-CM | POA: Insufficient documentation

## 2015-10-12 DIAGNOSIS — Z3202 Encounter for pregnancy test, result negative: Secondary | ICD-10-CM | POA: Insufficient documentation

## 2015-10-12 DIAGNOSIS — Z8739 Personal history of other diseases of the musculoskeletal system and connective tissue: Secondary | ICD-10-CM | POA: Diagnosis not present

## 2015-10-12 DIAGNOSIS — Z79899 Other long term (current) drug therapy: Secondary | ICD-10-CM | POA: Diagnosis not present

## 2015-10-12 DIAGNOSIS — R109 Unspecified abdominal pain: Secondary | ICD-10-CM

## 2015-10-12 DIAGNOSIS — M069 Rheumatoid arthritis, unspecified: Secondary | ICD-10-CM | POA: Insufficient documentation

## 2015-10-12 DIAGNOSIS — R1011 Right upper quadrant pain: Secondary | ICD-10-CM | POA: Insufficient documentation

## 2015-10-12 DIAGNOSIS — Z8619 Personal history of other infectious and parasitic diseases: Secondary | ICD-10-CM

## 2015-10-12 DIAGNOSIS — I10 Essential (primary) hypertension: Secondary | ICD-10-CM

## 2015-10-12 DIAGNOSIS — G8929 Other chronic pain: Secondary | ICD-10-CM

## 2015-10-12 DIAGNOSIS — K8020 Calculus of gallbladder without cholecystitis without obstruction: Secondary | ICD-10-CM

## 2015-10-12 DIAGNOSIS — M545 Low back pain: Secondary | ICD-10-CM | POA: Insufficient documentation

## 2015-10-12 LAB — URINALYSIS, ROUTINE W REFLEX MICROSCOPIC
Bilirubin Urine: NEGATIVE
Glucose, UA: NEGATIVE mg/dL
KETONES UR: NEGATIVE mg/dL
NITRITE: NEGATIVE
PH: 7 (ref 5.0–8.0)
Protein, ur: NEGATIVE mg/dL
SPECIFIC GRAVITY, URINE: 1.015 (ref 1.005–1.030)

## 2015-10-12 LAB — COMPREHENSIVE METABOLIC PANEL
ALBUMIN: 3.7 g/dL (ref 3.5–5.0)
ALBUMIN: 3.7 g/dL (ref 3.5–5.0)
ALK PHOS: 42 U/L (ref 38–126)
ALT: 17 U/L (ref 14–54)
ALT: 15 U/L (ref 14–54)
ANION GAP: 8 (ref 5–15)
AST: 20 U/L (ref 15–41)
AST: 17 U/L (ref 15–41)
Alkaline Phosphatase: 33 U/L — ABNORMAL LOW (ref 38–126)
Anion gap: 10 (ref 5–15)
BILIRUBIN TOTAL: 0.6 mg/dL (ref 0.3–1.2)
BUN: 12 mg/dL (ref 6–20)
BUN: 7 mg/dL (ref 6–20)
CALCIUM: 9.3 mg/dL (ref 8.9–10.3)
CO2: 27 mmol/L (ref 22–32)
CO2: 26 mmol/L (ref 22–32)
CREATININE: 1.07 mg/dL — AB (ref 0.44–1.00)
Calcium: 8.6 mg/dL — ABNORMAL LOW (ref 8.9–10.3)
Chloride: 102 mmol/L (ref 101–111)
Chloride: 105 mmol/L (ref 101–111)
Creatinine, Ser: 0.7 mg/dL (ref 0.44–1.00)
GFR calc Af Amer: >60 mL/min (ref >60)
GFR calc non Af Amer: >60 mL/min (ref >60)
GFR calc non Af Amer: >60 mL/min (ref >60)
GLUCOSE: 110 mg/dL — AB (ref 65–99)
GLUCOSE: 92 mg/dL (ref 65–99)
POTASSIUM: 3.8 mmol/L (ref 3.5–5.1)
Potassium: 3.6 mmol/L (ref 3.5–5.1)
SODIUM: 139 mmol/L (ref 135–145)
SODIUM: 139 mmol/L (ref 135–145)
TOTAL PROTEIN: 7 g/dL (ref 6.5–8.1)
Total Bilirubin: 0.4 mg/dL (ref 0.3–1.2)
Total Protein: 6.9 g/dL (ref 6.5–8.1)

## 2015-10-12 LAB — CBC WITH DIFFERENTIAL/PLATELET
BASOS ABS: 0 10*3/uL (ref 0.0–0.1)
BASOS ABS: 0 10*3/uL (ref 0.0–0.1)
BASOS PCT: 0 %
Basophils Relative: 0 %
EOS ABS: 0.1 10*3/uL (ref 0.0–0.7)
Eosinophils Absolute: 0.1 10*3/uL (ref 0.0–0.7)
Eosinophils Relative: 2 %
Eosinophils Relative: 2 %
HCT: 38.2 % (ref 36.0–46.0)
HEMATOCRIT: 35.9 % — AB (ref 36.0–46.0)
HEMOGLOBIN: 12.6 g/dL (ref 12.0–15.0)
HEMOGLOBIN: 11.8 g/dL — AB (ref 12.0–15.0)
LYMPHS ABS: 1.4 10*3/uL (ref 0.7–4.0)
Lymphocytes Relative: 25 %
Lymphocytes Relative: 24 %
Lymphs Abs: 1 10*3/uL (ref 0.7–4.0)
MCH: 29.2 pg (ref 26.0–34.0)
MCH: 28.9 pg (ref 26.0–34.0)
MCHC: 33 g/dL (ref 30.0–36.0)
MCHC: 32.9 g/dL (ref 30.0–36.0)
MCV: 88.4 fL (ref 78.0–100.0)
MCV: 88 fL (ref 78.0–100.0)
MONOS PCT: 7 %
Monocytes Absolute: 0.4 10*3/uL (ref 0.1–1.0)
Monocytes Absolute: 0.3 10*3/uL (ref 0.1–1.0)
Monocytes Relative: 7 %
NEUTROS ABS: 3 10*3/uL (ref 1.7–7.7)
NEUTROS PCT: 66 %
NEUTROS PCT: 67 %
Neutro Abs: 3.6 10*3/uL (ref 1.7–7.7)
Platelets: 267 10*3/uL (ref 150–400)
Platelets: 232 10*3/uL (ref 150–400)
RBC: 4.32 MIL/uL (ref 3.87–5.11)
RBC: 4.08 MIL/uL (ref 3.87–5.11)
RDW: 14 % (ref 11.5–15.5)
RDW: 14 % (ref 11.5–15.5)
WBC: 5.6 10*3/uL (ref 4.0–10.5)
WBC: 4.4 10*3/uL (ref 4.0–10.5)

## 2015-10-12 LAB — URINE MICROSCOPIC-ADD ON

## 2015-10-12 LAB — LIPASE, BLOOD
Lipase: 57 U/L — ABNORMAL HIGH (ref 11–51)
Lipase: 48 U/L (ref 11–51)

## 2015-10-12 LAB — PREGNANCY, URINE: Preg Test, Ur: NEGATIVE

## 2015-10-12 MED ORDER — DICYCLOMINE HCL 20 MG PO TABS
20.0000 mg | ORAL_TABLET | Freq: Two times a day (BID) | ORAL | Status: DC
Start: 2015-10-12 — End: 2015-11-26

## 2015-10-12 MED ORDER — HYDROMORPHONE HCL 1 MG/ML IJ SOLN
1.0000 mg | Freq: Once | INTRAMUSCULAR | Status: AC
  Administered 2015-10-12: 1 mg via INTRAVENOUS
  Filled 2015-10-12: qty 1

## 2015-10-12 MED ORDER — FENTANYL CITRATE (PF) 100 MCG/2ML IJ SOLN
100.0000 ug | Freq: Once | INTRAMUSCULAR | Status: AC
  Administered 2015-10-12: 100 ug via INTRAMUSCULAR
  Filled 2015-10-12: qty 2

## 2015-10-12 MED ORDER — SODIUM CHLORIDE 0.9 % IV BOLUS (SEPSIS)
1000.0000 mL | Freq: Once | INTRAVENOUS | Status: AC
  Administered 2015-10-12: 1000 mL via INTRAVENOUS

## 2015-10-12 MED ORDER — PROMETHAZINE HCL 25 MG PO TABS: 25.0000 mg | ORAL_TABLET | Freq: Four times a day (QID) | ORAL | Status: DC | PRN

## 2015-10-12 MED ORDER — PROMETHAZINE HCL 25 MG/ML IJ SOLN
25.0000 mg | Freq: Once | INTRAMUSCULAR | Status: AC
  Administered 2015-10-12: 25 mg via INTRAVENOUS
  Filled 2015-10-12: qty 1

## 2015-10-12 MED ORDER — ONDANSETRON 8 MG PO TBDP
8.0000 mg | ORAL_TABLET | Freq: Once | ORAL | Status: AC
  Administered 2015-10-12: 8 mg via ORAL
  Filled 2015-10-12: qty 1

## 2015-10-12 MED ORDER — RANITIDINE HCL 150 MG PO TABS: 150.0000 mg | ORAL_TABLET | Freq: Two times a day (BID) | ORAL | Status: DC

## 2015-10-12 MED ORDER — NITROFURANTOIN MONOHYD MACRO 100 MG PO CAPS: 100.0000 mg | ORAL_CAPSULE | Freq: Two times a day (BID) | ORAL | Status: DC

## 2015-10-12 NOTE — Discharge Instructions (Signed)
°Emergency Department Resource Guide °1) Find a Doctor and Pay Out of Pocket °Although you won't have to find out who is covered by your insurance plan, it is a good idea to ask around and get recommendations. You will then need to call the office and see if the doctor you have chosen will accept you as a new patient and what types of options they offer for patients who are self-pay. Some doctors offer discounts or will set up payment plans for their patients who do not have insurance, but you will need to ask so you aren't surprised when you get to your appointment. ° °2) Contact Your Local Health Department °Not all health departments have doctors that can see patients for sick visits, but many do, so it is worth a call to see if yours does. If you don't know where your local health department is, you can check in your phone book. The CDC also has a tool to help you locate your state's health department, and many state websites also have listings of all of their local health departments. ° °3) Find a Walk-in Clinic °If your illness is not likely to be very severe or complicated, you may want to try a walk in clinic. These are popping up all over the country in pharmacies, drugstores, and shopping centers. They're usually staffed by nurse practitioners or physician assistants that have been trained to treat common illnesses and complaints. They're usually fairly quick and inexpensive. However, if you have serious medical issues or chronic medical problems, these are probably not your best option. ° °No Primary Care Doctor: °- Call [facility] at  832-8000 - they can help you locate a primary care doctor that  accepts your insurance, provides certain services, etc. °- Physician Referral Service- 1-800-533-3463 ° °Chronic Pain Problems: °Organization         Address  Phone   Notes  °[facility]  ([phone]) [phone] Patients need to be referred by their primary care doctor.  ° °Medication  Assistance: °Organization         Address  Phone   Notes  °[place] [facility] [address] °[place], [place] [address] (336) 641-8030 --Must be a resident of [place] °-- Must have NO insurance coverage whatsoever (no Medicaid/ Medicare, etc.) °-- The pt. MUST have a primary care doctor that directs their care regularly and follows them in the community °  °[facility]  (866) 331-1348   °[employer]  (888) 892-1162   ° °Agencies that provide inexpensive medical care: °Organization         Address  Phone   Notes  °[facility]  (336) 832-8035   °[facility]    (336) 832-7272   °[facility] [address] °[place], [place] [address] (336) 832-4777   °[facility] [address], °[place] (336) 271-4999   °[facility]    (336) 373-0678   °[facility]    (336) 272-1050   °[facility] ° [address], [place] Phone:  (336) 832-4444, Fax:  (336) 832-4440 Hours of Operation:  9 am - 6 pm, M-F.  Also accepts Medicaid/Medicare and self-pay.  °[facility] ° [address], [place] Phone: (336) [phone], Fax: (336) [phone]. Hours of Operation:  8:30 am - 5:30 pm, M-F.  Also accepts Medicaid and self-pay.  °[facility] [place] [address], [place] Phone: (336) 878-6027   °[facility] [address], [place], [place] (336)723-1848, Ext. 123 Mondays & Thursdays: 7-9 AM.  First 15 patients are seen on a first come, first serve basis. °  ° °Medicaid-accepting [place] Providers: ° °Organization         Address  Phone   Notes  °[facility] [address], [place] (336) 641-2100 Also accepts self-pay patients.  °[facility] [address], [place] ° (336) 856-9996   °[facility] [address], [place]  (336) 288-8857   °[facility] [address] [place] [address], [place] (336) 299-7000   °Veita Bland [address], [place]  ° (336) 373-1557 Only accepts [place] Access Medicaid patients after they have their name applied to their card.  ° °Self-Pay (no insurance) in [place]: ° °Organization         Address  Phone   Notes  °Sickle Cell Patients, [facility] [address], [place] (336) 832-1970   °[facility] [address], [place] (336) 832-4400   °[facility] ° [address], [place] (336) 992-4800   °[facility]/Dr. Osei-Bonsu ° [address] [place] [address], [place] or [address], [place] (336) [phone] Phone number for both [place] and [place] locations is the same.  °[facility] [address], [place] (336) 299-0000   °[facility] [address] [place] [address], [place] or [address] (336) 852-7530 °(336) 878-2260   °[facility] [address], [place] (336) 350-1642, phone; (336) 294-5005, fax Sees patients 1st and 3rd Saturday of every month.  Must not qualify for public or private insurance (i.e. Medicaid, Medicare, [place] Health Choice, Veterans' Benefits) • Household income should be no more than 200% of the poverty level •The clinic cannot treat you if you are pregnant or think you are pregnant • Sexually transmitted diseases are not treated at the clinic.  ° ° °Dental Care: °Organization         Address  Phone  Notes  °[place] [facility] [address], [place] (336) 641-6152 Accepts children up to age 21 who are enrolled in Medicaid or [place] Health Choice; pregnant women with a Medicaid card; and children who have applied for Medicaid or [place] Health Choice, but were declined, whose parents can pay a reduced fee at time of service.  °[place]  [facility] [place]  [address], [place] (336) 641-7733 Accepts children up to age 21 who are enrolled in Medicaid or [place] Health Choice; pregnant women with a Medicaid card; and children who have applied for Medicaid or [place] Health Choice, but were declined, whose parents can pay a reduced fee at time of service.  °[facility] ° [address], [place] (336) 641-4533 Patients are seen by appointment only. Walk-ins are not accepted. [facility] will see patients 18 years of age and older. °Monday - Tuesday (8am-5pm) °Most Wednesdays (8:30-5pm) °$30 per visit, cash only  °[facility] ° [address], [place] (336) 641-4533 Patients are seen by appointment only. Walk-ins are not accepted. [facility] will see patients 18 years of age and older. °One   Wednesday Evening (Monthly: Volunteer Based).  $30 per visit, cash only  °[facility]  (919) [phone] for adults; Children under age 4, call Graduate Pediatric Dentistry at ([phone]) [phone]. Children aged 4-14, please call ([phone]) [phone] to request a pediatric application. ° Dental services are provided in all areas of dental care including fillings, crowns and bridges, complete and partial dentures, implants, gum treatment, root canals, and extractions. Preventive care is also provided. Treatment is provided to both adults and children. °Patients are selected via a lottery and there is often a waiting list. °  °[facility] [address], °[place] ° (336) 763-8833 [email or web address] °  °[facility] [address], [place], [place] (336)723-1848, Ext. 123 Second and Fourth Thursday of each month, opens at 6:30 AM; Clinic ends at 9 AM.  Patients are seen on a first-come first-served basis, and a limited number are seen during each clinic.  ° °[facility] ° [address], [place], [place] (336) [phone]    Eligibility Requirements °You must have lived in [place], [place], or [place] counties for at least the last three months. °  You cannot be eligible for state or federal sponsored healthcare insurance, including Veterans Administration, Medicaid, or Medicare. °  You generally cannot be eligible for healthcare insurance through your employer.  °  How to apply: °Eligibility screenings are held every Tuesday and Wednesday afternoon from 1:00 pm until 4:00 pm. You do not need an appointment for the interview!  °[facility] [address], [place], [place] 336-631-2330   °[facility]  336-342-8273   °[facility]  336-703-3100   °[facility]  336-570-6415   ° °Behavioral Health Resources in the Community: °Intensive Outpatient Programs °Organization         Address  Phone  Notes  °[facility] [address], [place], [place] 336-878-6098   °[facility] [address], [place], [place] 336-832-9800   °[facility] [address], [place], [place] ° 336-882-2125   °[facility] [address],  °[place], [place] 1-800-853-5163 or 336-641-4981   °Substance Abuse Resources °Organization         Address  Phone  Notes  °[facility]  336-882-2125   °[facility]  336-784-9470   °[facility]  336-285-9073   °[facility]  336-845-3988   °Residential & Outpatient Substance Abuse Program  1-800-659-3381   °Psychological Services °Organization         Address  Phone  Notes  °[facility]  336- 832-9600   °[facility]  336- 378-7881   °[facility] [address], [place] 1-800-853-5163 or 336-641-4981   ° °Mobile Crisis Teams °Organization         Address  Phone  Notes  °[facility], Mobile Crisis Care Unit  1-877-626-1772   °Assertive °[facility] ° [address]  [place], [place] 336-834-9664   °Sharon DeEsch [address] °[place] 336-554-5454   ° °Self-Help/Support Groups °Organization         Address  Phone             Notes  °[facility]  - variety of support groups  336- 373-1402 Call for more information  °Narcotics Anonymous (NA), [facility] [address], °[place]  2 meetings at this location  ° °  Residential Treatment Programs Organization         Address  Phone  Notes  [facility] [address],    [place]  6-599-357-0177   [facility]  [address] [place] [address], [place], [place] [phone]   [facility] [address] [place], [place] [phone] Admissions: 8am-3pm M-F  [facility] [address],    [place], [place] 563-893-7342   [facility] [address] [place], [place], [place] 876-811-5726   [facility] [address]  [place], [place] [phone]   [facility] Intensive Outpatient [address], [place], [place] 384-536-4680   [facility] ([facility] [address] [place]  [place], [place] 3-212-248-2500 or (267)419-9850   [facility] [address] [place], [place] 945-038-8828 Accepts Medicaid  [facility] [address],  [place] 0-034-917-9150 Substance Abuse/Addiction Treatment   [facility]         Address  Phone  Notes  [facility]  414-279-7637   Angie Fava, PhD [address] [place], [place]   626-221-5887 or [phone]   [facility]   [address] [place], [place] (469)[phone]   [facility] [address], [place], [place] 912-044-1316 Insurance/Medicaid/sponsorship through [facility] [address]                                    [place], [place] [phone] Therapy/tele-psych/case    [facility] [address], [place] (623)[phone]    Dr. Lolly Mustache  ([phone]   [facility]  [facility] Dept. 1) [address], [place] 2) [address], [place] 3)  [address], [place] [phone]  ([phone]   [facility] Child Abuse Hotline 575-684-4921 or 270-448-8405 (After Hours)      Take the prescriptions as directed. Your abdominal US is rescheduled for 9:00am on 10/13/15 at this hospital. Nothing to eat or drink after midnight tonight.  Call your regular medical doctor and your GI doctor on Monday to schedule a follow up appointment this week.  Return to the Emergency Department immediately sooner if worsening.

## 2015-10-12 NOTE — ED Notes (Addendum)
Pt reporting generalized abdominal pain on right side for about 1 hour.  Reporting associated nausea and diarrhea.  Pt was seen and treated on 1/30 for UTI.

## 2015-10-12 NOTE — Discharge Instructions (Signed)
Abdominal Pain, Adult °Many things can cause abdominal pain. Usually, abdominal pain is not caused by a disease and will improve without treatment. It can often be observed and treated at home. Your health care provider will do a physical exam and possibly order blood tests and X-rays to help determine the seriousness of your pain. However, in many cases, more time must pass before a clear cause of the pain can be found. Before that point, your health care provider may not know if you need more testing or further treatment. °HOME CARE INSTRUCTIONS °Monitor your abdominal pain for any changes. The following actions may help to alleviate any discomfort you are experiencing: °· Only take over-the-counter or prescription medicines as directed by your health care provider. °· Do not take laxatives unless directed to do so by your health care provider. °· Try a clear liquid diet (broth, tea, or water) as directed by your health care provider. Slowly move to a bland diet as tolerated. °SEEK MEDICAL CARE IF: °· You have unexplained abdominal pain. °· You have abdominal pain associated with nausea or diarrhea. °· You have pain when you urinate or have a bowel movement. °· You experience abdominal pain that wakes you in the night. °· You have abdominal pain that is worsened or improved by eating food. °· You have abdominal pain that is worsened with eating fatty foods. °· You have a fever. °SEEK IMMEDIATE MEDICAL CARE IF: °· Your pain does not go away within 2 hours. °· You keep throwing up (vomiting). °· Your pain is felt only in portions of the abdomen, such as the right side or the left lower portion of the abdomen. °· You pass bloody or black tarry stools. °MAKE SURE YOU: °· Understand these instructions. °· Will watch your condition. °· Will get help right away if you are not doing well or get worse. °  °This information is not intended to replace advice given to you by your health care provider. Make sure you discuss  any questions you have with your health care provider. °  °Document Released: 06/03/2005 Document Revised: 05/15/2015 Document Reviewed: 05/03/2013 °Elsevier Interactive Patient Education ©2016 Elsevier Inc. ° °Cholelithiasis °Cholelithiasis (also called gallstones) is a form of gallbladder disease in which gallstones form in your gallbladder. The gallbladder is an organ that stores bile made in the liver, which helps digest fats. Gallstones begin as small crystals and slowly grow into stones. Gallstone pain occurs when the gallbladder spasms and a gallstone is blocking the duct. Pain can also occur when a stone passes out of the duct.  °RISK FACTORS °· Being female.   °· Having multiple pregnancies. Health care providers sometimes advise removing diseased gallbladders before future pregnancies.   °· Being obese. °· Eating a diet heavy in fried foods and fat.   °· Being older than 60 years and increasing age.   °· Prolonged use of medicines containing female hormones.   °· Having diabetes mellitus.   °· Rapidly losing weight.   °· Having a family history of gallstones (heredity).   °SYMPTOMS °· Nausea.   °· Vomiting. °· Abdominal pain.   °· Yellowing of the skin (jaundice).   °· Sudden pain. It may persist from several minutes to several hours. °· Fever.   °· Tenderness to the touch.  °In some cases, when gallstones do not move into the bile duct, people have no pain or symptoms. These are called "silent" gallstones.  °TREATMENT °Silent gallstones do not need treatment. In severe cases, emergency surgery may be required. Options for treatment include: °· Surgery to   remove the gallbladder. This is the most common treatment. °· Medicines. These do not always work and may take 6-12 months or more to work. °· Shock wave treatment (extracorporeal biliary lithotripsy). In this treatment an ultrasound machine sends shock waves to the gallbladder to break gallstones into smaller pieces that can pass into the intestines or  be dissolved by medicine. °HOME CARE INSTRUCTIONS  °· Only take over-the-counter or prescription medicines for pain, discomfort, or fever as directed by your health care provider.   °· Follow a low-fat diet until seen again by your health care provider. Fat causes the gallbladder to contract, which can result in pain.   °· Follow up with your health care provider as directed. Attacks are almost always recurrent and surgery is usually required for permanent treatment.   °SEEK IMMEDIATE MEDICAL CARE IF:  °· Your pain increases and is not controlled by medicines.   °· You have a fever or persistent symptoms for more than 2-3 days.   °· You have a fever and your symptoms suddenly get worse.   °· You have persistent nausea and vomiting.   °MAKE SURE YOU:  °· Understand these instructions. °· Will watch your condition. °· Will get help right away if you are not doing well or get worse. °  °This information is not intended to replace advice given to you by your health care provider. Make sure you discuss any questions you have with your health care provider. °  °Document Released: 08/20/2005 Document Revised: 04/26/2013 Document Reviewed: 02/15/2013 °Elsevier Interactive Patient Education ©2016 Elsevier Inc. ° °

## 2015-10-12 NOTE — ED Notes (Signed)
MD at bedside. 

## 2015-10-12 NOTE — ED Notes (Signed)
Was here last night for flank pain, v/d.  Was suppose to have outpatient [place] but did not have a ride to get [place].  Told to come back in for evaluation.

## 2015-10-12 NOTE — ED Provider Notes (Signed)
CSN: [id number]     Arrival date & time 10/12/15  0131 History   First MD Initiated Contact with Patient 10/12/15 0136     Chief Complaint  Patient presents with  . Abdominal Pain     (Consider location/radiation/quality/duration/timing/severity/associated sxs/prior Treatment) HPI Comments: Patient presents to the ER for evaluation of right-sided abdominal pain. Patient reports that the symptoms began approximately an hour ago. She is complaining of severe, constant, stabbing pain in the right upper abdomen associated with nausea and diarrhea.  Patient is a 45 y.o. female presenting with abdominal pain.  Abdominal Pain Associated symptoms: diarrhea and nausea     Past Medical History  Diagnosis Date  . Hypertension   . Chronic back pain   . Sciatic pain     right  . Rib pain on right side     chronic  . Chronic neck pain   . Cervical radiculopathy   . Chronic knee pain   . Rheumatoid arthritis (HCC)     "Rhematoid factor positive but no symptoms" per EPIC notes 2012  . Fibromyalgia   . C. difficile diarrhea   . Diabetes mellitus without complication (HCC)     diet controlled   Past Surgical History  Procedure Laterality Date  . Tubal ligation    . Bladder surgery     Family History  Problem Relation Age of Onset  . Cancer Mother   . Diabetes Mother   . Colon cancer Father     AFTER AGE 7  . Crohn's disease Sister    Social History  Substance Use Topics  . Smoking status: Never Smoker   . Smokeless tobacco: Never Used  . Alcohol Use: No   OB History    Gravida Para Term Preterm AB TAB SAB Ectopic Multiple Living   Review of Systems  Gastrointestinal: Positive for nausea, abdominal pain and diarrhea.  All other systems reviewed and are negative.     Allergies  Mango flavor; Penicillins; Tramadol; Bactrim; Keflex; and Toradol  Home Medications   Prior to Admission medications   Medication Sig Start Date End Date Taking?  Authorizing Provider  acetaminophen (TYLENOL) 500 MG tablet Take 1,000 mg by mouth every 4 (four) hours as needed for moderate pain.    Historical Provider, MD  cholestyramine Lanetta Inch) 4 G packet Take 0.5 packets (2 g total) by mouth daily. Patient not taking: Reported on 09/26/2015 05/23/15   Nira Retort, NP  ciprofloxacin (CIPRO) 500 MG tablet Take 1 tablet (500 mg total) by mouth 2 (two) times daily. 10/07/15   Samuel Jester, DO  diclofenac (VOLTAREN) 50 MG EC tablet Take 1 tablet (50 mg total) by mouth 2 (two) times daily. Patient not taking: Reported on 09/26/2015 08/29/15   Janne Napoleon, NP  dicyclomine (BENTYL) 20 MG tablet Take 1 tablet (20 mg total) by mouth 2 (two) times daily. Patient not taking: Reported on 05/26/2015 05/21/15   Azalia Bilis, MD  diphenoxylate-atropine (LOMOTIL) 2.5-0.025 MG per tablet Take 2 tablets by mouth 4 (four) times daily as needed for diarrhea or loose stools. Patient not taking: Reported on 05/09/2015 05/04/15   Gilda Crease, MD  Eluxadoline (VIBERZI) 100 MG TABS Take 100 mg by mouth 2 (two) times daily. Patient not taking: Reported on 09/26/2015 06/06/15   [place], MD  HYDROcodone-acetaminophen (NORCO/VICODIN) 5-325 MG tablet 1 or 2 tabs PO q6 hours prn pain 10/07/15  Samuel Jester, DO  hyoscyamine (LEVSIN SL) 0.125 MG SL tablet Place 1 tablet (0.125 mg total) under the tongue every 4 (four) hours as needed. Patient not taking: Reported on 09/26/2015 06/04/15   Nira Retort, NP  lisinopril-hydrochlorothiazide (PRINZIDE,ZESTORETIC) 20-25 MG per tablet Take 2 tablets by mouth every morning.     Historical Provider, MD  ondansetron (ZOFRAN ODT) 4 MG disintegrating tablet 4mg  ODT q4 hours prn nausea/vomit Patient taking differently: Take 4 mg by mouth every 4 (four) hours as needed for nausea or vomiting.  09/26/15   09/28/15, MD  ondansetron (ZOFRAN) 4 MG tablet Take 1 tablet (4 mg total) by mouth every 6 (six) hours as needed for nausea or  vomiting. Patient not taking: Reported on 05/21/2015 05/09/15   07/09/15, MD  predniSONE (DELTASONE) 10 MG tablet 6, 5, 4, 3, 2 then 1 tablet by mouth daily for 6 days total. Patient not taking: Reported on 09/26/2015 08/11/15   14/4/16, PA-C  promethazine (PHENERGAN) 25 MG tablet Take 1 tablet (25 mg total) by mouth every 6 (six) hours as needed for nausea or vomiting. 10/07/15   10/09/15, DO  vancomycin (VANCOCIN) 250 MG capsule 1 PO Q6H FOR 7 DAYS THEN PO Q8H FOR 7 DAYS THEN PO Q12H FOR 7 DAYS THEN PO QD FOR 7 DAYS Patient not taking: Reported on 09/26/2015 05/15/15   07/15/15, MD  venlafaxine XR (EFFEXOR-XR) 75 MG 24 hr capsule Take 225 mg by mouth at bedtime.     Historical Provider, MD   LMP 10/12/2015 Physical Exam  Constitutional: She is oriented to person, place, and time. She appears well-developed and well-nourished. No distress.  HENT:  Head: Normocephalic and atraumatic.  Right Ear: Hearing normal.  Left Ear: Hearing normal.  Nose: Nose normal.  Mouth/Throat: Oropharynx is clear and moist and mucous membranes are normal.  Eyes: Conjunctivae and EOM are normal. Pupils are equal, round, and reactive to light.  Neck: Normal range of motion. Neck supple.  Cardiovascular: Regular rhythm, S1 normal and S2 normal.  Exam reveals no gallop and no friction rub.   No murmur heard. Pulmonary/Chest: Effort normal and breath sounds normal. No respiratory distress. She exhibits no tenderness.  Abdominal: Soft. Normal appearance and bowel sounds are normal. There is no hepatosplenomegaly. There is tenderness in the right upper quadrant. There is no rebound, no guarding, no tenderness at McBurney's point and negative Murphy's sign. No hernia.  Musculoskeletal: Normal range of motion.  Neurological: She is alert and oriented to person, place, and time. She has normal strength. No cranial nerve deficit or sensory deficit. Coordination normal. GCS eye subscore is 4. GCS verbal  subscore is 5. GCS motor subscore is 6.  Skin: Skin is warm, dry and intact. No rash noted. No cyanosis.  Psychiatric: She has a normal mood and affect. Her speech is normal and behavior is normal. Thought content normal.  Nursing note and vitals reviewed.   ED Course  Procedures (including critical care time) Labs Review Labs Reviewed  CBC WITH DIFFERENTIAL/PLATELET  COMPREHENSIVE METABOLIC PANEL  LIPASE, BLOOD  URINALYSIS, ROUTINE W REFLEX MICROSCOPIC (NOT AT [facility])    Imaging Review No results found. I have personally reviewed and evaluated these images and lab results as part of my medical decision-making.   EKG Interpretation None      MDM   Final diagnoses:  None   abdominal pain  gallstone  Presents to the ER for evaluation of abdominal pain. Patient  reports sudden onset of right-sided abdominal pain prior to arrival. Pain is in the right upper quadrant. She has had nausea but no vomiting. She has had diarrhea associated with the symptoms. Examination revealed tenderness without Murphy's sign. Reviewing her records reveals that she was seen in the ER recently for flank pain and had a CT scan. She had a very small gallstone noted at that time. Labs do not reveal any elevated LFTs. She has a very slightly elevated lipase at 57.  Recheck at 03:15 reveals that patient was sleeping comfortably. She endorses improvement with treatment. With normal labs, normal vital signs, she is appropriate for outpatient ultrasound. Will discharge, have her return in the morning for formal ultrasound. Specifically looking for cholecystitis and common bile duct dilatation. If abnormal, will address, otherwise will have patient follow-up with Dr. Lovell Sheehan for gallstone.    Gilda Crease, MD 10/12/15 [phone]

## 2015-10-12 NOTE — ED Notes (Signed)
Pt [place] rescheduled for 0900 on 10/13/15 at this hospital. NPO after midnight.

## 2015-10-12 NOTE — ED Provider Notes (Signed)
CSN: [id number]     Arrival date & time 10/12/15  1313 History   First MD Initiated Contact with Patient 10/12/15 1532     Chief Complaint  Patient presents with  . Flank Pain    right  . Abdominal Pain      HPI Pt was seen at 1535. Per pt, c/o gradual onset and persistence of constant RUQ "pain" that began yesterday. Has been associated with nausea and diarrhea. Pt was evaluated in the ED overnight last night for her symptoms with reassuring workup, and was scheduled for outpatient abd [place] this morning. Pt states she "didn't have a ride" to get her [place] this morning, so she came back to the ED this afternoon. Denies vomiting, no black or blood in stools, no fevers, no dysuria/hematuria, no vaginal bleeding/discharge, no CP/SOB.    Past Medical History  Diagnosis Date  . Hypertension   . Chronic back pain   . Sciatic pain     right  . Rib pain on right side     chronic  . Chronic neck pain   . Cervical radiculopathy   . Chronic knee pain   . Rheumatoid arthritis (HCC)     "Rhematoid factor positive but no symptoms" per EPIC notes 2012  . Fibromyalgia   . C. difficile diarrhea   . Diabetes mellitus without complication (HCC)     diet controlled   Past Surgical History  Procedure Laterality Date  . Tubal ligation    . Bladder surgery     Family History  Problem Relation Age of Onset  . Cancer Mother   . Diabetes Mother   . Colon cancer Father     AFTER AGE 25  . Crohn's disease Sister    Social History  Substance Use Topics  . Smoking status: Never Smoker   . Smokeless tobacco: Never Used  . Alcohol Use: No   OB History    Gravida Para Term Preterm AB TAB SAB Ectopic Multiple Living   3 3 3       3      Review of Systems ROS: Statement: All systems negative except as marked or noted in the HPI; Constitutional: Negative for fever and chills. ; ; Eyes: Negative for eye pain, redness and discharge. ; ; ENMT: Negative for ear pain, hoarseness, nasal congestion, sinus  pressure and sore throat. ; ; Cardiovascular: Negative for chest pain, palpitations, diaphoresis, dyspnea and peripheral edema. ; ; Respiratory: Negative for cough, wheezing and stridor. ; ; Gastrointestinal: +nausea, diarrhea, abd pain. Negative for vomiting, blood in stool, hematemesis, jaundice and rectal bleeding. . ; ; Genitourinary: Negative for dysuria, flank pain and hematuria. ; ; Musculoskeletal: +LBP. Negative for neck pain. Negative for swelling and trauma.; ; Skin: Negative for pruritus, rash, abrasions, blisters, bruising and skin lesion.; ; Neuro: Negative for headache, lightheadedness and neck stiffness. Negative for weakness, altered level of consciousness , altered mental status, extremity weakness, paresthesias, involuntary movement, seizure and syncope.      Allergies  Mango flavor; Penicillins; Tramadol; Bactrim; Keflex; and Toradol  Home Medications   Prior to Admission medications   Medication Sig Start Date End Date Taking? Authorizing Provider  acetaminophen (TYLENOL) 500 MG tablet Take 1,000 mg by mouth every 4 (four) hours as needed for moderate pain.   Yes Historical Provider, MD  ciprofloxacin (CIPRO) 500 MG tablet Take 1 tablet (500 mg total) by mouth 2 (two) times daily. 10/07/15  Yes 10/09/15, DO  lisinopril-hydrochlorothiazide (PRINZIDE,ZESTORETIC) 20-25 MG  per tablet Take 2 tablets by mouth every morning.    Yes Historical Provider, MD  venlafaxine XR (EFFEXOR-XR) 75 MG 24 hr capsule Take 225 mg by mouth at bedtime.    Yes Historical Provider, MD  dicyclomine (BENTYL) 20 MG tablet Take 1 tablet (20 mg total) by mouth 2 (two) times daily. 10/12/15   Gilda Crease, MD  HYDROcodone-acetaminophen (NORCO/VICODIN) 5-325 MG tablet 1 or 2 tabs PO q6 hours prn pain Patient not taking: Reported on 10/12/2015 10/07/15   Samuel Jester, DO  promethazine (PHENERGAN) 25 MG tablet Take 1 tablet (25 mg total) by mouth every 6 (six) hours as needed for nausea or  vomiting. Patient not taking: Reported on 10/12/2015 10/07/15   Samuel Jester, DO  ranitidine (ZANTAC) 150 MG tablet Take 1 tablet (150 mg total) by mouth 2 (two) times daily. 10/12/15   Gilda Crease, MD   BP 128/90 mmHg  Pulse 60  Temp(Src) 99.1 F (37.3 C) (Oral)  Resp 14  Ht 5\' 1"  (1.549 m)  Wt 195 lb (88.451 kg)  BMI 36.86 kg/m2  SpO2 91%  LMP 10/12/2015 Physical Exam  1540: Physical examination:  Nursing notes reviewed; Vital signs and O2 SAT reviewed;  Constitutional: Well developed, Well nourished, Well hydrated, In no acute distress; Head:  Normocephalic, atraumatic; Eyes: EOMI, PERRL, No scleral icterus; ENMT: Mouth and pharynx normal, Mucous membranes moist; Neck: Supple, Full range of motion, No lymphadenopathy; Cardiovascular: Regular rate and rhythm, No murmur, rub, or gallop; Respiratory: Breath sounds clear & equal bilaterally, No rales, rhonchi, wheezes.  Speaking full sentences with ease, Normal respiratory effort/excursion; Chest: Nontender, Movement normal; Abdomen: Soft, +mild RUQ tenderness to palp. No rebound or guarding. Nondistended, Normal bowel sounds; Genitourinary: No CVA tenderness; Spine:  No midline CS, TS, LS tenderness. +TTP right lumbar paraspinal muscles.;; Extremities: Pulses normal, No tenderness, No edema, No calf edema or asymmetry.; Neuro: AA&Ox3, Major CN grossly intact.  Speech clear. No gross focal motor or sensory deficits in extremities.; Skin: Color normal, Warm, Dry.   ED Course  Procedures (including critical care time) Labs Review  Imaging Review  I have personally reviewed and evaluated these images and lab results as part of my medical decision-making.   EKG Interpretation None      MDM  MDM Reviewed: previous chart, nursing note and vitals Reviewed previous: labs, CT scan and ultrasound Interpretation: labs and CT scan     Results for orders placed or performed during the hospital encounter of 10/12/15  Urinalysis,  Routine w reflex microscopic (not at [facility])  Result Value Ref Range   Color, Urine YELLOW YELLOW   APPearance CLEAR CLEAR   Specific Gravity, Urine 1.015 1.005 - 1.030   pH 7.0 5.0 - 8.0   Glucose, UA NEGATIVE NEGATIVE mg/dL   Hgb urine dipstick LARGE (A) NEGATIVE   Bilirubin Urine NEGATIVE NEGATIVE   Ketones, ur NEGATIVE NEGATIVE mg/dL   Protein, ur NEGATIVE NEGATIVE mg/dL   Nitrite NEGATIVE NEGATIVE   Leukocytes, UA TRACE (A) NEGATIVE  Urine microscopic-add on  Result Value Ref Range   Squamous Epithelial / LPF 0-5 (A) NONE SEEN   WBC, UA 6-30 0 - 5 WBC/hpf   RBC / HPF 6-30 0 - 5 RBC/hpf   Bacteria, UA MANY (A) NONE SEEN  Comprehensive metabolic panel  Result Value Ref Range   Sodium 139 135 - 145 mmol/L   Potassium 3.8 3.5 - 5.1 mmol/L   Chloride 105 101 - 111 mmol/L   CO2  26 22 - 32 mmol/L   Glucose, Bld 92 65 - 99 mg/dL   BUN 7 6 - 20 mg/dL   Creatinine, Ser 1.47 0.44 - 1.00 mg/dL   Calcium 8.6 (L) 8.9 - 10.3 mg/dL   Total Protein 7.0 6.5 - 8.1 g/dL   Albumin 3.7 3.5 - 5.0 g/dL   AST 17 15 - 41 U/L   ALT 15 14 - 54 U/L   Alkaline Phosphatase 33 (L) 38 - 126 U/L   Total Bilirubin 0.6 0.3 - 1.2 mg/dL   GFR calc non Af Amer >60 >60 mL/min   GFR calc Af Amer >60 >60 mL/min   Anion gap 8 5 - 15  Lipase, blood  Result Value Ref Range   Lipase 48 11 - 51 U/L  CBC with Differential  Result Value Ref Range   WBC 4.4 4.0 - 10.5 K/uL   RBC 4.08 3.87 - 5.11 MIL/uL   Hemoglobin 11.8 (L) 12.0 - 15.0 g/dL   HCT 82.9 (L) 56.2 - 13.0 %   MCV 88.0 78.0 - 100.0 fL   MCH 28.9 26.0 - 34.0 pg   MCHC 32.9 30.0 - 36.0 g/dL   RDW 86.5 78.4 - 69.6 %   Platelets 232 150 - 400 K/uL   Neutrophils Relative % 67 %   Neutro Abs 3.0 1.7 - 7.7 K/uL   Lymphocytes Relative 24 %   Lymphs Abs 1.0 0.7 - 4.0 K/uL   Monocytes Relative 7 %   Monocytes Absolute 0.3 0.1 - 1.0 K/uL   Eosinophils Relative 2 %   Eosinophils Absolute 0.1 0.0 - 0.7 K/uL   Basophils Relative 0 %   Basophils Absolute  0.0 0.0 - 0.1 K/uL  Pregnancy, urine  Result Value Ref Range   Preg Test, Ur NEGATIVE NEGATIVE    Ct Renal Stone Study 10/12/2015  CLINICAL DATA:  Stone Study Standard/Full. Right side pain, nausea vomiting. Hx of HTN,diabetes,chronic back pain, tubal ligation, bladder surgery./bbj EXAM: CT ABDOMEN AND PELVIS WITHOUT CONTRAST TECHNIQUE: Multidetector CT imaging of the abdomen and pelvis was performed following the standard protocol without IV contrast. COMPARISON:  09/26/2015 FINDINGS: Lower chest: No pulmonary nodules, pleural effusions, or infiltrates. Heart size is normal. No imaged pericardial effusion or significant coronary artery calcifications. Upper abdomen: The liver is diffusely low in attenuation, consistent with hepatic steatosis. No focal liver lesions are identified on this noncontrast exam. There is a cyst within the lower pole of the left kidney, better assessed on previous contrast-enhanced exam. No evidence for hydronephrosis. There are no intrarenal or ureteral stones. The gallbladder is present. Probable small calcified stone present. Gastrointestinal tract: The stomach and small bowel loops are normal in appearance. The appendix is well seen and has a normal appearance. Colonic loops are normal in appearance. Pelvis: The uterus and adnexal regions are normal in appearance. No free pelvic fluid. Adjacent to the urethra, there are 2 low-attenuation structures, measure approximate 1 cm each and similar in appearance to prior study there less well evaluated on this noncontrast exam. 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 Retroperitoneum: No retroperitoneal or mesenteric adenopathy. No evidence for aortic aneurysm. Abdominal wall: Unremarkable. Osseous structures: Unremarkable. IMPRESSION: 1. No evidence for urinary tract calculi for obstruction. 2. Hepatic steatosis. 3. Small left renal cyst, previously identified. 4. Normal appendix. 5. 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 6.  Cholelithiasis. Electronically Signed   By: Norva Pavlov M.D.   On: 10/12/2015 17:16    1800:  LFT's normal. CT reassuring.  EPIC chart reviewed: Flow Manager attempt to contact pt 10/10/15 for UC results/abx change, and was unable to reach pt. Pt states she did not receive phone call or letter. Will change abx based on last UC. Pt has tol PO well while in the ED without N/V. No stooling while in the ED. [place] RUQ re-scheduled for tomorrow morning; pt strongly encouraged to keep her appt. Dx and testing d/w pt. Questions answered.  Verb understanding, agreeable to d/c home with outpt f/u.   Samuel Jester, DO 10/16/15 1713

## 2015-10-12 NOTE — ED Notes (Signed)
Lab at bedside

## 2015-10-13 ENCOUNTER — Inpatient Hospital Stay (HOSPITAL_COMMUNITY): Admit: 2015-10-13 | Payer: Medicare Other

## 2015-10-13 ENCOUNTER — Encounter (HOSPITAL_COMMUNITY): Payer: Self-pay | Admitting: *Deleted

## 2015-10-13 ENCOUNTER — Emergency Department (HOSPITAL_COMMUNITY)
Admission: EM | Admit: 2015-10-13 | Discharge: 2015-10-13 | Disposition: A | Discharge status: Home / Self Care | Payer: Medicare Other | Attending: Emergency Medicine | Admitting: Emergency Medicine

## 2015-10-13 DIAGNOSIS — M069 Rheumatoid arthritis, unspecified: Secondary | ICD-10-CM | POA: Insufficient documentation

## 2015-10-13 DIAGNOSIS — E119 Type 2 diabetes mellitus without complications: Secondary | ICD-10-CM | POA: Diagnosis not present

## 2015-10-13 DIAGNOSIS — Z79899 Other long term (current) drug therapy: Secondary | ICD-10-CM | POA: Insufficient documentation

## 2015-10-13 DIAGNOSIS — Z792 Long term (current) use of antibiotics: Secondary | ICD-10-CM | POA: Insufficient documentation

## 2015-10-13 DIAGNOSIS — R1011 Right upper quadrant pain: Secondary | ICD-10-CM | POA: Diagnosis present

## 2015-10-13 DIAGNOSIS — G8929 Other chronic pain: Secondary | ICD-10-CM | POA: Insufficient documentation

## 2015-10-13 DIAGNOSIS — Z880 Allergy status to penicillin: Secondary | ICD-10-CM | POA: Diagnosis not present

## 2015-10-13 DIAGNOSIS — Z8619 Personal history of other infectious and parasitic diseases: Secondary | ICD-10-CM | POA: Insufficient documentation

## 2015-10-13 DIAGNOSIS — Z9851 Tubal ligation status: Secondary | ICD-10-CM | POA: Insufficient documentation

## 2015-10-13 DIAGNOSIS — I10 Essential (primary) hypertension: Secondary | ICD-10-CM | POA: Insufficient documentation

## 2015-10-13 DIAGNOSIS — K8050 Calculus of bile duct without cholangitis or cholecystitis without obstruction: Secondary | ICD-10-CM

## 2015-10-13 DIAGNOSIS — K8020 Calculus of gallbladder without cholecystitis without obstruction: Secondary | ICD-10-CM | POA: Diagnosis not present

## 2015-10-13 HISTORY — DX: Unspecified abdominal pain: R10.9

## 2015-10-13 HISTORY — DX: Other chronic pain: G89.29

## 2015-10-13 LAB — COMPREHENSIVE METABOLIC PANEL
ALBUMIN: 3.6 g/dL (ref 3.5–5.0)
ALT: 16 U/L (ref 14–54)
AST: 18 U/L (ref 15–41)
Alkaline Phosphatase: 31 U/L — ABNORMAL LOW (ref 38–126)
Anion gap: 8 (ref 5–15)
BUN: 7 mg/dL (ref 6–20)
CHLORIDE: 105 mmol/L (ref 101–111)
CO2: 27 mmol/L (ref 22–32)
Calcium: 8.6 mg/dL — ABNORMAL LOW (ref 8.9–10.3)
Creatinine, Ser: 0.7 mg/dL (ref 0.44–1.00)
GFR calc Af Amer: >60 mL/min (ref >60)
GLUCOSE: 86 mg/dL (ref 65–99)
POTASSIUM: 3.8 mmol/L (ref 3.5–5.1)
SODIUM: 140 mmol/L (ref 135–145)
Total Bilirubin: 0.4 mg/dL (ref 0.3–1.2)
Total Protein: 7 g/dL (ref 6.5–8.1)

## 2015-10-13 LAB — CBC
HEMATOCRIT: 37.4 % (ref 36.0–46.0)
Hemoglobin: 12.6 g/dL (ref 12.0–15.0)
MCH: 29.7 pg (ref 26.0–34.0)
MCHC: 33.7 g/dL (ref 30.0–36.0)
MCV: 88.2 fL (ref 78.0–100.0)
Platelets: 228 10*3/uL (ref 150–400)
RBC: 4.24 MIL/uL (ref 3.87–5.11)
RDW: 14 % (ref 11.5–15.5)
WBC: 3.7 10*3/uL — AB (ref 4.0–10.5)

## 2015-10-13 LAB — LIPASE, BLOOD: LIPASE: 43 U/L (ref 11–51)

## 2015-10-13 MED ORDER — METOCLOPRAMIDE HCL 10 MG PO TABS
10.0000 mg | ORAL_TABLET | Freq: Once | ORAL | Status: AC
  Administered 2015-10-13: 10 mg via ORAL
  Filled 2015-10-13: qty 1

## 2015-10-13 MED ORDER — METOCLOPRAMIDE HCL 10 MG PO TABS: 10.0000 mg | ORAL_TABLET | Freq: Three times a day (TID) | ORAL | Status: DC | PRN

## 2015-10-13 MED ORDER — HYDROCODONE-ACETAMINOPHEN 5-325 MG PO TABS: 1.0000 | ORAL_TABLET | Freq: Once | ORAL | Status: DC

## 2015-10-13 MED ORDER — HYDROCODONE-ACETAMINOPHEN 5-325 MG PO TABS
1.0000 | ORAL_TABLET | Freq: Once | ORAL | Status: AC
  Administered 2015-10-13: 1 via ORAL
  Filled 2015-10-13: qty 1

## 2015-10-13 NOTE — Discharge Instructions (Signed)

## 2015-10-13 NOTE — ED Provider Notes (Signed)
CSN: [id number]     Arrival date & time 10/13/15  1510 History   First MD Initiated Contact with Patient 10/13/15 1638     Chief Complaint  Patient presents with  . Abdominal Pain     (Consider location/radiation/quality/duration/timing/severity/associated sxs/prior Treatment) HPI Patient presents with right upper quadrant pain radiating to the right flank. She's had these symptoms now for the last 3 days. She's been seen in emergency department several times. She's had a CT scan that revealed cholelithiasis without evidence of cholecystitis. She's also had normal laboratory workup. She was scheduled for an ultrasound this morning but states she was unable to go because she did not have a ride. She's had multiple episodes of vomiting and some loose stool. Patient does have a history of chronic pain Past Medical History  Diagnosis Date  . Hypertension   . Chronic back pain   . Sciatic pain     right  . Rib pain on right side     chronic  . Chronic neck pain   . Cervical radiculopathy   . Chronic knee pain   . Rheumatoid arthritis (HCC)     "Rhematoid factor positive but no symptoms" per EPIC notes 2012  . Fibromyalgia   . C. difficile diarrhea   . Diabetes mellitus without complication (HCC)     diet controlled  . Chronic abdominal pain    Past Surgical History  Procedure Laterality Date  . Tubal ligation    . Bladder surgery     Family History  Problem Relation Age of Onset  . Cancer Mother   . Diabetes Mother   . Colon cancer Father     AFTER AGE 90  . Crohn's disease Sister    Social History  Substance Use Topics  . Smoking status: Never Smoker   . Smokeless tobacco: Never Used  . Alcohol Use: No   OB History    Gravida Para Term Preterm AB TAB SAB Ectopic Multiple Living   3 3 3       3      Review of Systems  Constitutional: Negative for fever and chills.  Respiratory: Negative for shortness of breath.   Cardiovascular: Negative for chest pain.   Gastrointestinal: Positive for nausea, vomiting, abdominal pain and diarrhea. Negative for blood in stool.  Genitourinary: Negative for dysuria, frequency, hematuria, flank pain and difficulty urinating.  Musculoskeletal: Positive for back pain. Negative for neck pain and neck stiffness.  Skin: Negative for rash and wound.  Neurological: Negative for dizziness, syncope, weakness, numbness and headaches.  All other systems reviewed and are negative.     Allergies  Mango flavor; Penicillins; Tramadol; Bactrim; Keflex; and Toradol  Home Medications   Prior to Admission medications   Medication Sig Start Date End Date Taking? Authorizing Provider  acetaminophen (TYLENOL) 500 MG tablet Take 1,000 mg by mouth every 4 (four) hours as needed for moderate pain.   Yes Historical Provider, MD  lisinopril-hydrochlorothiazide (PRINZIDE,ZESTORETIC) 20-25 MG per tablet Take 2 tablets by mouth every morning.    Yes Historical Provider, MD  venlafaxine XR (EFFEXOR-XR) 75 MG 24 hr capsule Take 225 mg by mouth at bedtime.    Yes Historical Provider, MD  dicyclomine (BENTYL) 20 MG tablet Take 1 tablet (20 mg total) by mouth 2 (two) times daily. 10/12/15   Gilda Crease, MD  HYDROcodone-acetaminophen (NORCO/VICODIN) 5-325 MG tablet Take 1 tablet by mouth once. 10/13/15   Loren Racer, MD  metoCLOPramide (REGLAN) 10 MG tablet Take 1  tablet (10 mg total) by mouth every 8 (eight) hours as needed for nausea or vomiting. 10/13/15   Loren Racer, MD  nitrofurantoin, macrocrystal-monohydrate, (MACROBID) 100 MG capsule Take 1 capsule (100 mg total) by mouth 2 (two) times daily. 10/12/15   Samuel Jester, DO  promethazine (PHENERGAN) 25 MG tablet Take 1 tablet (25 mg total) by mouth every 6 (six) hours as needed for nausea or vomiting. 10/12/15   Samuel Jester, DO  ranitidine (ZANTAC) 150 MG tablet Take 1 tablet (150 mg total) by mouth 2 (two) times daily. 10/12/15   Gilda Crease, MD   BP 132/94 mmHg   Pulse 94  Temp(Src) 98.7 F (37.1 C) (Oral)  Resp 20  SpO2 95%  LMP 10/12/2015 Physical Exam  Constitutional: She is oriented to person, place, and time. She appears well-developed and well-nourished. No distress.  HENT:  Head: Normocephalic and atraumatic.  Mouth/Throat: Oropharynx is clear and moist. No oropharyngeal exudate.  Eyes: EOM are normal. Pupils are equal, round, and reactive to light.  Neck: Normal range of motion. Neck supple.  Cardiovascular: Normal rate and regular rhythm.   Pulmonary/Chest: Effort normal and breath sounds normal. No respiratory distress. She has no wheezes. She has no rales.  Abdominal: Soft. Bowel sounds are normal. She exhibits no distension and no mass. There is tenderness (ight upper quadrant tenderness with palpation. No rebound or guarding.). There is no rebound and no guarding.  Musculoskeletal: Normal range of motion. She exhibits tenderness. She exhibits no edema.  Mild right flank tenderness.  Neurological: She is alert and oriented to person, place, and time.  Skin: Skin is warm and dry. No rash noted. No erythema.  Psychiatric: She has a normal mood and affect. Her behavior is normal.  Nursing note and vitals reviewed.   ED Course  Procedures (including critical care time) Labs Review Labs Reviewed  COMPREHENSIVE METABOLIC PANEL - Abnormal; Notable for the following:    Calcium 8.6 (*)    Alkaline Phosphatase 31 (*)    All other components within normal limits  CBC - Abnormal; Notable for the following:    WBC 3.7 (*)    All other components within normal limits  LIPASE, BLOOD    Imaging Review Ct Renal Stone Study  10/12/2015  CLINICAL DATA:  Stone Study Standard/Full. Right side pain, nausea vomiting. Hx of HTN,diabetes,chronic back pain, tubal ligation, bladder surgery./bbj EXAM: CT ABDOMEN AND PELVIS WITHOUT CONTRAST TECHNIQUE: Multidetector CT imaging of the abdomen and pelvis was performed following the standard protocol  without IV contrast. COMPARISON:  09/26/2015 FINDINGS: Lower chest: No pulmonary nodules, pleural effusions, or infiltrates. Heart size is normal. No imaged pericardial effusion or significant coronary artery calcifications. Upper abdomen: The liver is diffusely low in attenuation, consistent with hepatic steatosis. No focal liver lesions are identified on this noncontrast exam. There is a cyst within the lower pole of the left kidney, better assessed on previous contrast-enhanced exam. No evidence for hydronephrosis. There are no intrarenal or ureteral stones. The gallbladder is present. Probable small calcified stone present. Gastrointestinal tract: The stomach and small bowel loops are normal in appearance. The appendix is well seen and has a normal appearance. Colonic loops are normal in appearance. Pelvis: The uterus and adnexal regions are normal in appearance. No free pelvic fluid. Adjacent to the urethra, there are 2 low-attenuation structures, measure approximate 1 cm each and similar in appearance to prior study there less well evaluated on this noncontrast exam. Findings are most consistent with small  urethral diverticula. Retroperitoneum: No retroperitoneal or mesenteric adenopathy. No evidence for aortic aneurysm. Abdominal wall: Unremarkable. Osseous structures: Unremarkable. IMPRESSION: 1. No evidence for urinary tract calculi for obstruction. 2. Hepatic steatosis. 3. Small left renal cyst, previously identified. 4. Normal appendix. 5. 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 6. Cholelithiasis. Electronically Signed   By: Norva Pavlov M.D.   On: 10/12/2015 17:16   I have personally reviewed and evaluated these images and lab results as part of my medical decision-making.   EKG Interpretation None      MDM   Final diagnoses:  Biliary colic    She is very well-appearing. Bedside ultrasound unsuccessful due to bowel gas obscuring the gallbladder. Patient has normal white blood cell count and  LFTs. I believe that cholecystitis is very unlikely. Patient's had multiple CT scans and discussed the risk of cancer due to radiation exposure. Patient's symptoms improved after medication. Abdominal exam remains benign. No further vomiting. The patient can be safely discharged home to follow-up with general surgery. She understands the need to avoid fatty foods and return immediately for worsening pain, fever or any concerns.    Loren Racer, MD 10/13/15 [phone]

## 2015-10-13 NOTE — ED Notes (Signed)
Pt pink warm and dry. Verbalized improvement of abd pain 6/10. No nausea.

## 2015-10-13 NOTE — ED Notes (Signed)
Pt comes in with right abdominal pain. Pt was seen here for this yesterday and previously in the past week. Pt was suppose to get ultrasound done this morning and pt states, "my daughter was using the car so I couldn't make it." Pt explains she has been throwing up as well. NAD noted in triage.

## 2015-10-14 LAB — URINE CULTURE

## 2015-10-17 ENCOUNTER — Observation Stay (HOSPITAL_COMMUNITY)
Admission: EM | Admit: 2015-10-17 | Discharge: 2015-10-19 | Disposition: A | Discharge status: Home / Self Care | Payer: Medicare Other | Attending: General Surgery | Admitting: General Surgery

## 2015-10-17 ENCOUNTER — Encounter (HOSPITAL_COMMUNITY): Payer: Self-pay | Admitting: Emergency Medicine

## 2015-10-17 ENCOUNTER — Emergency Department (HOSPITAL_COMMUNITY): Payer: Medicare Other

## 2015-10-17 DIAGNOSIS — I10 Essential (primary) hypertension: Secondary | ICD-10-CM | POA: Diagnosis not present

## 2015-10-17 DIAGNOSIS — Z79899 Other long term (current) drug therapy: Secondary | ICD-10-CM | POA: Diagnosis not present

## 2015-10-17 DIAGNOSIS — K8010 Calculus of gallbladder with chronic cholecystitis without obstruction: Secondary | ICD-10-CM | POA: Diagnosis present

## 2015-10-17 DIAGNOSIS — Z800 Family history of malignant neoplasm of digestive organs: Secondary | ICD-10-CM | POA: Diagnosis not present

## 2015-10-17 DIAGNOSIS — E119 Type 2 diabetes mellitus without complications: Secondary | ICD-10-CM | POA: Diagnosis not present

## 2015-10-17 DIAGNOSIS — Z23 Encounter for immunization: Secondary | ICD-10-CM | POA: Diagnosis not present

## 2015-10-17 DIAGNOSIS — Z6839 Body mass index (BMI) 39.0-39.9, adult: Secondary | ICD-10-CM | POA: Diagnosis not present

## 2015-10-17 DIAGNOSIS — R1110 Vomiting, unspecified: Secondary | ICD-10-CM | POA: Diagnosis not present

## 2015-10-17 DIAGNOSIS — M069 Rheumatoid arthritis, unspecified: Secondary | ICD-10-CM | POA: Diagnosis not present

## 2015-10-17 DIAGNOSIS — R1011 Right upper quadrant pain: Secondary | ICD-10-CM | POA: Diagnosis present

## 2015-10-17 DIAGNOSIS — M797 Fibromyalgia: Secondary | ICD-10-CM | POA: Diagnosis not present

## 2015-10-17 DIAGNOSIS — Z809 Family history of malignant neoplasm, unspecified: Secondary | ICD-10-CM | POA: Diagnosis not present

## 2015-10-17 LAB — COMPREHENSIVE METABOLIC PANEL
ALBUMIN: 4.1 g/dL (ref 3.5–5.0)
ALT: 19 U/L (ref 14–54)
ANION GAP: 7 (ref 5–15)
AST: 18 U/L (ref 15–41)
Alkaline Phosphatase: 32 U/L — ABNORMAL LOW (ref 38–126)
BUN: 11 mg/dL (ref 6–20)
CO2: 28 mmol/L (ref 22–32)
Calcium: 9.2 mg/dL (ref 8.9–10.3)
Chloride: 105 mmol/L (ref 101–111)
Creatinine, Ser: 0.68 mg/dL (ref 0.44–1.00)
GFR calc Af Amer: >60 mL/min (ref >60)
GFR calc non Af Amer: >60 mL/min (ref >60)
GLUCOSE: 103 mg/dL — AB (ref 65–99)
POTASSIUM: 4 mmol/L (ref 3.5–5.1)
SODIUM: 140 mmol/L (ref 135–145)
Total Bilirubin: 0.5 mg/dL (ref 0.3–1.2)
Total Protein: 7.6 g/dL (ref 6.5–8.1)

## 2015-10-17 LAB — CBC
HEMATOCRIT: 40.1 % (ref 36.0–46.0)
HEMOGLOBIN: 13.4 g/dL (ref 12.0–15.0)
MCH: 28.9 pg (ref 26.0–34.0)
MCHC: 33.4 g/dL (ref 30.0–36.0)
MCV: 86.6 fL (ref 78.0–100.0)
Platelets: 291 10*3/uL (ref 150–400)
RBC: 4.63 MIL/uL (ref 3.87–5.11)
RDW: 13.5 % (ref 11.5–15.5)
WBC: 7.3 10*3/uL (ref 4.0–10.5)

## 2015-10-17 LAB — LIPASE, BLOOD: Lipase: 44 U/L (ref 11–51)

## 2015-10-17 MED ORDER — SIMETHICONE 80 MG PO CHEW: 40.0000 mg | CHEWABLE_TABLET | Freq: Four times a day (QID) | ORAL | Status: DC | PRN

## 2015-10-17 MED ORDER — LISINOPRIL 10 MG PO TABS
20.0000 mg | ORAL_TABLET | Freq: Every day | ORAL | Status: DC
  Administered 2015-10-19: 20 mg via ORAL
  Filled 2015-10-17: qty 2

## 2015-10-17 MED ORDER — INFLUENZA VAC SPLIT QUAD 0.5 ML IM SUSY
0.5000 mL | PREFILLED_SYRINGE | INTRAMUSCULAR | Status: AC
  Administered 2015-10-19: 0.5 mL via INTRAMUSCULAR
  Filled 2015-10-17: qty 0.5

## 2015-10-17 MED ORDER — LISINOPRIL-HYDROCHLOROTHIAZIDE 20-25 MG PO TABS: 2.0000 | ORAL_TABLET | Freq: Every morning | ORAL | Status: DC

## 2015-10-17 MED ORDER — KCL IN DEXTROSE-NACL 20-5-0.45 MEQ/L-%-% IV SOLN
INTRAVENOUS | Status: DC
  Administered 2015-10-17: 18:00:00 via INTRAVENOUS

## 2015-10-17 MED ORDER — DIPHENHYDRAMINE HCL 25 MG PO CAPS
25.0000 mg | ORAL_CAPSULE | Freq: Four times a day (QID) | ORAL | Status: DC | PRN
  Administered 2015-10-19: 25 mg via ORAL
  Filled 2015-10-17: qty 1

## 2015-10-17 MED ORDER — ACETAMINOPHEN 325 MG PO TABS: 650.0000 mg | ORAL_TABLET | Freq: Four times a day (QID) | ORAL | Status: DC | PRN

## 2015-10-17 MED ORDER — CIPROFLOXACIN IN D5W 400 MG/200ML IV SOLN
400.0000 mg | Freq: Two times a day (BID) | INTRAVENOUS | Status: DC
  Administered 2015-10-17 – 2015-10-19 (×4): 400 mg via INTRAVENOUS
  Filled 2015-10-17 (×4): qty 200

## 2015-10-17 MED ORDER — HYDROMORPHONE HCL 1 MG/ML IJ SOLN
1.0000 mg | INTRAMUSCULAR | Status: DC | PRN
  Administered 2015-10-17 – 2015-10-19 (×10): 1 mg via INTRAVENOUS
  Filled 2015-10-17 (×10): qty 1

## 2015-10-17 MED ORDER — MORPHINE SULFATE (PF) 4 MG/ML IV SOLN
4.0000 mg | INTRAVENOUS | Status: AC | PRN
Start: 2015-10-17 — End: 2015-10-18
  Administered 2015-10-17 – 2015-10-18 (×3): 4 mg via INTRAVENOUS
  Filled 2015-10-17 (×3): qty 1

## 2015-10-17 MED ORDER — ONDANSETRON HCL 4 MG/2ML IJ SOLN
4.0000 mg | Freq: Four times a day (QID) | INTRAMUSCULAR | Status: DC | PRN
  Administered 2015-10-18 – 2015-10-19 (×2): 4 mg via INTRAVENOUS
  Filled 2015-10-17 (×2): qty 2

## 2015-10-17 MED ORDER — ONDANSETRON 4 MG PO TBDP: 4.0000 mg | ORAL_TABLET | Freq: Four times a day (QID) | ORAL | Status: DC | PRN

## 2015-10-17 MED ORDER — PANTOPRAZOLE SODIUM 40 MG IV SOLR
40.0000 mg | Freq: Every day | INTRAVENOUS | Status: DC
  Administered 2015-10-17: 40 mg via INTRAVENOUS
  Filled 2015-10-17: qty 40

## 2015-10-17 MED ORDER — ENOXAPARIN SODIUM 40 MG/0.4ML ~~LOC~~ SOLN
40.0000 mg | SUBCUTANEOUS | Status: DC
Start: 2015-10-17 — End: 2015-10-19
  Administered 2015-10-17 – 2015-10-18 (×2): 40 mg via SUBCUTANEOUS
  Filled 2015-10-17 (×2): qty 0.4

## 2015-10-17 MED ORDER — ACETAMINOPHEN 650 MG RE SUPP: 650.0000 mg | Freq: Four times a day (QID) | RECTAL | Status: DC | PRN

## 2015-10-17 MED ORDER — PROMETHAZINE HCL 25 MG/ML IJ SOLN
12.5000 mg | Freq: Once | INTRAMUSCULAR | Status: AC
  Administered 2015-10-17: 12.5 mg via INTRAVENOUS
  Filled 2015-10-17: qty 1

## 2015-10-17 MED ORDER — HYDROCHLOROTHIAZIDE 25 MG PO TABS
25.0000 mg | ORAL_TABLET | Freq: Every day | ORAL | Status: DC
  Administered 2015-10-19: 25 mg via ORAL
  Filled 2015-10-17: qty 1

## 2015-10-17 MED ORDER — CHLORHEXIDINE GLUCONATE 4 % EX LIQD
1.0000 "application " | Freq: Once | CUTANEOUS | Status: AC
  Administered 2015-10-17: 1 via TOPICAL
  Filled 2015-10-17: qty 15

## 2015-10-17 MED ORDER — OXYCODONE-ACETAMINOPHEN 5-325 MG PO TABS
1.0000 | ORAL_TABLET | ORAL | Status: DC | PRN
  Administered 2015-10-17 – 2015-10-18 (×2): 2 via ORAL
  Administered 2015-10-18: 1 via ORAL
  Administered 2015-10-19 (×3): 2 via ORAL
  Filled 2015-10-17 (×4): qty 2
  Filled 2015-10-17: qty 1
  Filled 2015-10-17 (×3): qty 2

## 2015-10-17 MED ORDER — DIPHENHYDRAMINE HCL 50 MG/ML IJ SOLN
25.0000 mg | Freq: Four times a day (QID) | INTRAMUSCULAR | Status: DC | PRN
  Administered 2015-10-18: 25 mg via INTRAVENOUS
  Filled 2015-10-17: qty 1

## 2015-10-17 NOTE — ED Provider Notes (Signed)
CSN: [id number]     Arrival date & time 10/17/15  1223 History   First MD Initiated Contact with Patient 10/17/15 1354     Chief Complaint  Patient presents with  . Abdominal Pain      HPI  She presents for valuation of abdominal pain and vomiting.  Patient seen and evaluated here on the fourth, M.D. 8. Also seen on the third in [place]. His had a regular quadrant abdominal pain with vomiting. Has CT scan several days ago showing gallstones. Was seen yesterday. Medicated. Discharge. Was to have a follow-up appointment Dr. Lovell Sheehan of Gen. surgery on Tuesday. Said vomiting mostly like continued right upper quadrant pain and protects here for reevaluation  Denies  fever. Heme-negative nonbilious emesis. Loose stools but no blood pus or mucus in her stool. No lower quadrant pain.  Past Medical History  Diagnosis Date  . Hypertension   . Chronic back pain   . Sciatic pain     right  . Rib pain on right side     chronic  . Chronic neck pain   . Cervical radiculopathy   . Chronic knee pain   . Rheumatoid arthritis (HCC)     "Rhematoid factor positive but no symptoms" per EPIC notes 2012  . Fibromyalgia   . C. difficile diarrhea   . Diabetes mellitus without complication (HCC)     diet controlled  . Chronic abdominal pain    Past Surgical History  Procedure Laterality Date  . Tubal ligation    . Bladder surgery     Family History  Problem Relation Age of Onset  . Cancer Mother   . Diabetes Mother   . Colon cancer Father     AFTER AGE 62  . Crohn's disease Sister    Social History  Substance Use Topics  . Smoking status: Never Smoker   . Smokeless tobacco: Never Used  . Alcohol Use: No   OB History    Gravida Para Term Preterm AB TAB SAB Ectopic Multiple Living   3 3 3       3      Review of Systems  Constitutional: Negative for fever, chills, diaphoresis, appetite change and fatigue.  HENT: Negative for mouth sores, sore throat and trouble swallowing.     Eyes: Negative for visual disturbance.  Respiratory: Negative for cough, chest tightness, shortness of breath and wheezing.   Cardiovascular: Negative for chest pain.  Gastrointestinal: Positive for nausea, vomiting and abdominal pain. Negative for diarrhea and abdominal distention.  Endocrine: Negative for polydipsia, polyphagia and polyuria.  Genitourinary: Negative for dysuria, frequency and hematuria.  Musculoskeletal: Negative for gait problem.  Skin: Negative for color change, pallor and rash.  Neurological: Negative for dizziness, syncope, light-headedness and headaches.  Hematological: Does not bruise/bleed easily.  Psychiatric/Behavioral: Negative for behavioral problems and confusion.      Allergies  Mango flavor; Penicillins; Tramadol; Bactrim; Keflex; and Toradol  Home Medications   Prior to Admission medications   Medication Sig Start Date End Date Taking? Authorizing Provider  acetaminophen (TYLENOL) 500 MG tablet Take 1,000 mg by mouth every 4 (four) hours as needed for moderate pain.   Yes Historical Provider, MD  HYDROcodone-acetaminophen (NORCO/VICODIN) 5-325 MG tablet Take 1 tablet by mouth once. Patient taking differently: Take 1 tablet by mouth every 6 (six) hours as needed for moderate pain or severe pain.  10/13/15  Yes 12/11/15, MD  lisinopril-hydrochlorothiazide (PRINZIDE,ZESTORETIC) 20-25 MG per tablet Take 2 tablets by mouth every  morning.    Yes Historical Provider, MD  metoCLOPramide (REGLAN) 10 MG tablet Take 1 tablet (10 mg total) by mouth every 8 (eight) hours as needed for nausea or vomiting. 10/13/15  Yes Loren Racer, MD  dicyclomine (BENTYL) 20 MG tablet Take 1 tablet (20 mg total) by mouth 2 (two) times daily. 10/12/15   Gilda Crease, MD  nitrofurantoin, macrocrystal-monohydrate, (MACROBID) 100 MG capsule Take 1 capsule (100 mg total) by mouth 2 (two) times daily. 10/12/15   Samuel Jester, DO  promethazine (PHENERGAN) 25 MG tablet Take  1 tablet (25 mg total) by mouth every 6 (six) hours as needed for nausea or vomiting. 10/12/15   Samuel Jester, DO  ranitidine (ZANTAC) 150 MG tablet Take 1 tablet (150 mg total) by mouth 2 (two) times daily. 10/12/15   Gilda Crease, MD  venlafaxine XR (EFFEXOR-XR) 75 MG 24 hr capsule Take 225 mg by mouth at bedtime.     Historical Provider, MD   BP 143/96 mmHg  Pulse 96  Temp(Src) 97.6 F (36.4 C) (Oral)  Resp 18  Ht 5' (1.524 m)  Wt 197 lb (89.359 kg)  BMI 38.47 kg/m2  SpO2 97%  LMP 10/12/2015 Physical Exam  Constitutional: She is oriented to person, place, and time. She appears well-developed and well-nourished. No distress.  HENT:  Head: Normocephalic.  Eyes: Conjunctivae are normal. Pupils are equal, round, and reactive to light. No scleral icterus.  Neck: Normal range of motion. Neck supple. No thyromegaly present.  Cardiovascular: Normal rate and regular rhythm.  Exam reveals no gallop and no friction rub.   No murmur heard. Pulmonary/Chest: Effort normal and breath sounds normal. No respiratory distress. She has no wheezes. She has no rales.  Abdominal: Soft. Bowel sounds are normal. She exhibits no distension. There is tenderness. There is no rebound.    Tenderness without frank peritoneal irritation in the right upper quadrant.  Musculoskeletal: Normal range of motion.  Neurological: She is alert and oriented to person, place, and time.  Skin: Skin is warm and dry. No rash noted.  Psychiatric: She has a normal mood and affect. Her behavior is normal.    ED Course  Procedures (including critical care time) Labs Review Labs Reviewed  COMPREHENSIVE METABOLIC PANEL - Abnormal; Notable for the following:    Glucose, Bld 103 (*)    Alkaline Phosphatase 32 (*)    All other components within normal limits  LIPASE, BLOOD  CBC    Imaging Review US Abdomen Limited Ruq  10/17/2015  CLINICAL DATA:  Right upper quadrant abdominal pain. EXAM: US ABDOMEN LIMITED -  RIGHT UPPER QUADRANT COMPARISON:  CT scan of October 12, 2015 FINDINGS: Gallbladder: Multiple gallstones and some degree of sludge are present. Largest stone measures 4.5 mm. No gallbladder wall thickening or pericholecystic fluid is noted. No sonographic Murphy's sign is noted. Common bile duct: Diameter: 5.7 mm which is within normal limits. Liver: No focal lesion identified. Within normal limits in parenchymal echogenicity. IMPRESSION: Cholelithiasis is noted without evidence of cholecystitis. No other abnormality seen in the right upper quadrant of the abdomen. Electronically Signed   By: Lupita Raider, M.D.   On: 10/17/2015 15:18   I have personally reviewed and evaluated these images and lab results as part of my medical decision-making.   EKG Interpretation None      MDM   Final diagnoses:  RUQ pain     Pt presents for 4th visit in less than 1 week.  [place] +  stones, but without cholecystitis.  D/W Dr. Lovell Sheehan of general Surgery.  He will admit the patient for planned AM Cholecystectomy.  Rolland Porter, MD 10/17/15 [phone]

## 2015-10-17 NOTE — ED Notes (Signed)
PT states she is scheduled to see Dr. Lovell Sheehan for possible gallbladder removal on 10/22/15 office visit and states worsening in RUQ abdominal pain that radiates into her back and states vomiting x5 in 24hrs and states was told to come to ED if worsening in symptoms per his office staff.

## 2015-10-18 ENCOUNTER — Observation Stay (HOSPITAL_COMMUNITY): Payer: Medicare Other | Admitting: Anesthesiology

## 2015-10-18 ENCOUNTER — Encounter (HOSPITAL_COMMUNITY): Payer: Self-pay | Admitting: *Deleted

## 2015-10-18 ENCOUNTER — Encounter (HOSPITAL_COMMUNITY)
Admission: EM | Disposition: A | Discharge status: Home / Self Care | Payer: Self-pay | Source: Home / Self Care | Attending: Emergency Medicine

## 2015-10-18 ENCOUNTER — Observation Stay (HOSPITAL_COMMUNITY): Payer: Medicare Other

## 2015-10-18 DIAGNOSIS — E119 Type 2 diabetes mellitus without complications: Secondary | ICD-10-CM | POA: Diagnosis not present

## 2015-10-18 DIAGNOSIS — I10 Essential (primary) hypertension: Secondary | ICD-10-CM | POA: Diagnosis not present

## 2015-10-18 DIAGNOSIS — K8010 Calculus of gallbladder with chronic cholecystitis without obstruction: Secondary | ICD-10-CM | POA: Diagnosis not present

## 2015-10-18 DIAGNOSIS — R1110 Vomiting, unspecified: Secondary | ICD-10-CM | POA: Diagnosis not present

## 2015-10-18 HISTORY — PX: CHOLECYSTECTOMY: SHX55

## 2015-10-18 LAB — SURGICAL PCR SCREEN
MRSA, PCR: NEGATIVE
STAPHYLOCOCCUS AUREUS: NEGATIVE

## 2015-10-18 SURGERY — LAPAROSCOPIC CHOLECYSTECTOMY
Anesthesia: General

## 2015-10-18 MED ORDER — FENTANYL CITRATE (PF) 100 MCG/2ML IJ SOLN
INTRAMUSCULAR | Status: AC
  Filled 2015-10-18: qty 2

## 2015-10-18 MED ORDER — FENTANYL CITRATE (PF) 250 MCG/5ML IJ SOLN
INTRAMUSCULAR | Status: DC | PRN
  Administered 2015-10-18 (×2): 50 ug via INTRAVENOUS

## 2015-10-18 MED ORDER — ROCURONIUM BROMIDE 100 MG/10ML IV SOLN
INTRAVENOUS | Status: DC | PRN
  Administered 2015-10-18: 5 mg via INTRAVENOUS

## 2015-10-18 MED ORDER — SUCCINYLCHOLINE CHLORIDE 20 MG/ML IJ SOLN
INTRAMUSCULAR | Status: AC
  Filled 2015-10-18: qty 1

## 2015-10-18 MED ORDER — HEMOSTATIC AGENTS (NO CHARGE) OPTIME
TOPICAL | Status: DC | PRN
  Administered 2015-10-18: 1 via TOPICAL

## 2015-10-18 MED ORDER — SODIUM CHLORIDE 0.9 % IR SOLN
Status: DC | PRN
  Administered 2015-10-18: 1000 mL

## 2015-10-18 MED ORDER — ONDANSETRON HCL 4 MG/2ML IJ SOLN
INTRAMUSCULAR | Status: AC
  Filled 2015-10-18: qty 2

## 2015-10-18 MED ORDER — PROPOFOL 10 MG/ML IV BOLUS
INTRAVENOUS | Status: AC
  Filled 2015-10-18: qty 20

## 2015-10-18 MED ORDER — SUCCINYLCHOLINE CHLORIDE 20 MG/ML IJ SOLN
INTRAMUSCULAR | Status: DC | PRN
  Administered 2015-10-18: 110 mg via INTRAVENOUS

## 2015-10-18 MED ORDER — GLYCOPYRROLATE 0.2 MG/ML IJ SOLN
INTRAMUSCULAR | Status: DC | PRN
  Administered 2015-10-18: .6 mg via INTRAVENOUS

## 2015-10-18 MED ORDER — BUPIVACAINE HCL (PF) 0.5 % IJ SOLN
INTRAMUSCULAR | Status: DC | PRN
  Administered 2015-10-18: 10 mL

## 2015-10-18 MED ORDER — EPHEDRINE SULFATE 50 MG/ML IJ SOLN
INTRAMUSCULAR | Status: DC | PRN
  Administered 2015-10-18 (×2): 5 mg via INTRAVENOUS

## 2015-10-18 MED ORDER — LACTATED RINGERS IV SOLN
INTRAVENOUS | Status: DC
  Administered 2015-10-18 (×2): 1000 mL via INTRAVENOUS

## 2015-10-18 MED ORDER — ROCURONIUM BROMIDE 50 MG/5ML IV SOLN
INTRAVENOUS | Status: AC
  Filled 2015-10-18: qty 1

## 2015-10-18 MED ORDER — ONDANSETRON HCL 4 MG/2ML IJ SOLN: 4.0000 mg | Freq: Once | INTRAMUSCULAR | Status: DC | PRN

## 2015-10-18 MED ORDER — GLYCOPYRROLATE 0.2 MG/ML IJ SOLN
INTRAMUSCULAR | Status: AC
  Filled 2015-10-18: qty 3

## 2015-10-18 MED ORDER — PROPOFOL 10 MG/ML IV BOLUS
INTRAVENOUS | Status: DC | PRN
  Administered 2015-10-18: 150 mg via INTRAVENOUS

## 2015-10-18 MED ORDER — LACTATED RINGERS IV SOLN
INTRAVENOUS | Status: DC
  Administered 2015-10-18 – 2015-10-19 (×2): via INTRAVENOUS

## 2015-10-18 MED ORDER — FENTANYL CITRATE (PF) 250 MCG/5ML IJ SOLN
INTRAMUSCULAR | Status: AC
  Filled 2015-10-18: qty 5

## 2015-10-18 MED ORDER — NEOSTIGMINE METHYLSULFATE 10 MG/10ML IV SOLN
INTRAVENOUS | Status: DC | PRN
  Administered 2015-10-18: 3 mg via INTRAVENOUS

## 2015-10-18 MED ORDER — FENTANYL CITRATE (PF) 100 MCG/2ML IJ SOLN
25.0000 ug | INTRAMUSCULAR | Status: DC | PRN
  Administered 2015-10-18 (×4): 50 ug via INTRAVENOUS

## 2015-10-18 MED ORDER — MIDAZOLAM HCL 2 MG/2ML IJ SOLN
1.0000 mg | INTRAMUSCULAR | Status: DC | PRN
  Administered 2015-10-18: 2 mg via INTRAVENOUS

## 2015-10-18 MED ORDER — LIDOCAINE HCL (PF) 1 % IJ SOLN
INTRAMUSCULAR | Status: AC
  Filled 2015-10-18: qty 5

## 2015-10-18 MED ORDER — LORAZEPAM 2 MG/ML IJ SOLN: 1.0000 mg | INTRAMUSCULAR | Status: DC | PRN

## 2015-10-18 MED ORDER — SIMETHICONE 80 MG PO CHEW: 40.0000 mg | CHEWABLE_TABLET | Freq: Four times a day (QID) | ORAL | Status: DC | PRN

## 2015-10-18 MED ORDER — POVIDONE-IODINE 10 % OINT PACKET
TOPICAL_OINTMENT | CUTANEOUS | Status: DC | PRN
  Administered 2015-10-18: 1 via TOPICAL

## 2015-10-18 MED ORDER — MIDAZOLAM HCL 2 MG/2ML IJ SOLN
INTRAMUSCULAR | Status: AC
  Filled 2015-10-18: qty 2

## 2015-10-18 MED ORDER — BUPIVACAINE HCL (PF) 0.5 % IJ SOLN
INTRAMUSCULAR | Status: AC
  Filled 2015-10-18: qty 30

## 2015-10-18 MED ORDER — POVIDONE-IODINE 10 % EX OINT
TOPICAL_OINTMENT | CUTANEOUS | Status: AC
  Filled 2015-10-18: qty 1

## 2015-10-18 MED ORDER — LIDOCAINE HCL 1 % IJ SOLN
INTRAMUSCULAR | Status: DC | PRN
  Administered 2015-10-18: 40 mg via INTRADERMAL

## 2015-10-18 SURGICAL SUPPLY — 39 items
APPLIER CLIP LAPSCP 10X32 DD (CLIP) ×2 IMPLANT
BAG HAMPER (MISCELLANEOUS) ×2 IMPLANT
CHLORAPREP W/TINT 26ML (MISCELLANEOUS) ×2 IMPLANT
CLOTH BEACON ORANGE TIMEOUT ST (SAFETY) ×2 IMPLANT
COVER LIGHT HANDLE STERIS (MISCELLANEOUS) ×4 IMPLANT
DECANTER SPIKE VIAL GLASS SM (MISCELLANEOUS) ×2 IMPLANT
ELECT REM PT RETURN 9FT ADLT (ELECTROSURGICAL) ×2
ELECTRODE REM PT RTRN 9FT ADLT (ELECTROSURGICAL) ×1 IMPLANT
FILTER SMOKE EVAC LAPAROSHD (FILTER) ×2 IMPLANT
FORMALIN 10 PREFIL 120ML (MISCELLANEOUS) ×2 IMPLANT
GLOVE BIOGEL M 7.0 STRL (GLOVE) ×2 IMPLANT
GLOVE BIOGEL PI IND STRL 7.0 (GLOVE) ×3 IMPLANT
GLOVE BIOGEL PI INDICATOR 7.0 (GLOVE) ×3
GLOVE ECLIPSE 6.5 STRL STRAW (GLOVE) ×2 IMPLANT
GLOVE SURG SS PI 7.5 STRL IVOR (GLOVE) ×2 IMPLANT
GOWN STRL REUS W/ TWL XL LVL3 (GOWN DISPOSABLE) ×1 IMPLANT
GOWN STRL REUS W/TWL LRG LVL3 (GOWN DISPOSABLE) ×4 IMPLANT
GOWN STRL REUS W/TWL XL LVL3 (GOWN DISPOSABLE) ×1
HEMOSTAT SNOW SURGICEL 2X4 (HEMOSTASIS) ×2 IMPLANT
INST SET LAPROSCOPIC AP (KITS) ×2 IMPLANT
KIT ROOM TURNOVER APOR (KITS) ×2 IMPLANT
MANIFOLD NEPTUNE II (INSTRUMENTS) ×2 IMPLANT
NEEDLE INSUFFLATION 14GA 120MM (NEEDLE) ×2 IMPLANT
NS IRRIG 1000ML POUR BTL (IV SOLUTION) ×2 IMPLANT
PACK LAP CHOLE LZT030E (CUSTOM PROCEDURE TRAY) ×2 IMPLANT
PAD ARMBOARD 7.5X6 YLW CONV (MISCELLANEOUS) ×2 IMPLANT
POUCH SPECIMEN RETRIEVAL 10MM (ENDOMECHANICALS) ×2 IMPLANT
SET BASIN LINEN APH (SET/KITS/TRAYS/PACK) ×2 IMPLANT
SLEEVE ENDOPATH XCEL 5M (ENDOMECHANICALS) ×2 IMPLANT
SPONGE GAUZE 2X2 8PLY STRL LF (GAUZE/BANDAGES/DRESSINGS) ×8 IMPLANT
STAPLER VISISTAT (STAPLE) ×2 IMPLANT
SUT VICRYL 0 UR6 27IN ABS (SUTURE) ×2 IMPLANT
TAPE CLOTH SURG 4X10 WHT LF (GAUZE/BANDAGES/DRESSINGS) ×2 IMPLANT
TROCAR ENDO BLADELESS 11MM (ENDOMECHANICALS) ×2 IMPLANT
TROCAR XCEL NON-BLD 5MMX100MML (ENDOMECHANICALS) ×2 IMPLANT
TROCAR XCEL UNIV SLVE 11M 100M (ENDOMECHANICALS) ×2 IMPLANT
TUBING INSUFFLATION (TUBING) ×2 IMPLANT
WARMER LAPAROSCOPE (MISCELLANEOUS) ×2 IMPLANT
YANKAUER SUCT 12FT TUBE ARGYLE (SUCTIONS) ×2 IMPLANT

## 2015-10-18 NOTE — Anesthesia Postprocedure Evaluation (Signed)
Anesthesia Post Note  Patient: Regina Patton  Procedure(s) Performed: Procedure(s) (LRB): LAPAROSCOPIC CHOLECYSTECTOMY (N/A)  Patient location during evaluation: PACU Anesthesia Type: General Level of consciousness: awake Pain management: pain level controlled Vital Signs Assessment: post-procedure vital signs reviewed and stable Respiratory status: spontaneous breathing Cardiovascular status: blood pressure returned to baseline Postop Assessment: no signs of nausea or vomiting Anesthetic complications: no    Last Vitals:  Filed Vitals:   10/18/15 1255 10/18/15 1400  BP: 122/91 153/79  Pulse:  83  Temp:  36.6 C  Resp: 14 13    Last Pain:  Filed Vitals:   10/18/15 1412  PainSc: 0-No pain                 Decorey Wahlert

## 2015-10-18 NOTE — Care Management Obs Status (Signed)
MEDICARE OBSERVATION STATUS NOTIFICATION   Patient Details  Name: MONACA WADAS MRN: [id number] Date of Birth: January 02, 1971   Medicare Observation Status Notification Given:  Yes    Adonis Huguenin, RN 10/18/2015, 9:28 AM

## 2015-10-18 NOTE — Transfer of Care (Signed)
Immediate Anesthesia Transfer of Care Note  Patient: Regina Patton  Procedure(s) Performed: Procedure(s): LAPAROSCOPIC CHOLECYSTECTOMY (N/A)  Patient Location: PACU  Anesthesia Type:General  Level of Consciousness: awake  Airway & Oxygen Therapy: Patient Spontanous Breathing and Patient connected to face mask oxygen  Post-op Assessment: Report given to RN  Post vital signs: Reviewed and stable  Last Vitals:  Filed Vitals:   10/18/15 1251 10/18/15 1255  BP: 122/91 122/91  Pulse:    Temp:    Resp: 11 14    Complications: No apparent anesthesia complications

## 2015-10-18 NOTE — Anesthesia Procedure Notes (Signed)
Procedure Name: Intubation Date/Time: 10/18/2015 1:07 PM Performed by: Glynn Octave E Pre-anesthesia Checklist: Patient identified, Patient being monitored, Timeout performed, Emergency Drugs available and Suction available Patient Re-evaluated:Patient Re-evaluated prior to inductionOxygen Delivery Method: Circle System Utilized Preoxygenation: Pre-oxygenation with 100% oxygen Intubation Type: IV induction, Rapid sequence and Cricoid Pressure applied Ventilation: Mask ventilation without difficulty Grade View: Grade I Tube type: Oral Tube size: 7.0 mm Number of attempts: 1 Airway Equipment and Method: Stylet and Video-laryngoscopy Placement Confirmation: ETT inserted through vocal cords under direct vision,  positive ETCO2 and breath sounds checked- equal and bilateral Secured at: 21 cm Tube secured with: Tape Dental Injury: Teeth and Oropharynx as per pre-operative assessment  Difficulty Due To: Difficulty was anticipated Future Recommendations: Recommend- induction with short-acting agent, and alternative techniques readily available Comments: Grade one view with glidescope, anterior airway.

## 2015-10-18 NOTE — Progress Notes (Signed)
Eyeglasses returned to pt intact.

## 2015-10-18 NOTE — Progress Notes (Signed)
Sleeping at intervals. Rates pain 10 while awake. Resp adequate/nonlabored. O2 continued.

## 2015-10-18 NOTE — Anesthesia Preprocedure Evaluation (Signed)
Anesthesia Evaluation  Patient identified by MRN, date of birth, ID band Patient awake    Reviewed: Allergy & Precautions, NPO status , Patient's Chart, lab work & pertinent test results  Airway Mallampati: III  TM Distance: <3 FB Neck ROM: Full    Dental  (+) Poor Dentition   Pulmonary    Pulmonary exam normal        Cardiovascular hypertension, Pt. on medications Normal cardiovascular exam     Neuro/Psych  Neuromuscular disease    GI/Hepatic   Endo/Other  diabetesMorbid obesity  Renal/GU      Musculoskeletal  (+) Arthritis , Osteoarthritis,  Fibromyalgia -, narcotic dependent  Abdominal (+) + obese,   Peds  Hematology   Anesthesia Other Findings   Reproductive/Obstetrics                             Anesthesia Physical Anesthesia Plan  ASA: III  Anesthesia Plan: General   Post-op Pain Management:    Induction: Cricoid pressure planned and Rapid sequence  Airway Management Planned: Oral ETT  Additional Equipment:   Intra-op Plan:   Post-operative Plan:   Informed Consent: I have reviewed the patients History and Physical, chart, labs and discussed the procedure including the risks, benefits and alternatives for the proposed anesthesia with the patient or authorized representative who has indicated his/her understanding and acceptance.   Dental advisory given  Plan Discussed with: CRNA  Anesthesia Plan Comments:         Anesthesia Quick Evaluation

## 2015-10-18 NOTE — H&P (Signed)
Regina Patton is an 45 y.o. female.   Chief Complaint: Right upper quadrant abdominal pain HPI: Patient is a 45 year old white female multiple medical problems who presents with a one-week history of worsening right upper quadrant abdominal pain. She has been seen several times in the emergency room. She was supposed to see me in my office next week as an outpatient. Ultrasound reveals cholelithiasis with a normal common bile duct. She has been experiencing nausea and vomiting. She denies any fever, chills, or jaundice.  Past Medical History  Diagnosis Date  . Hypertension   . Chronic back pain   . Sciatic pain     right  . Rib pain on right side     chronic  . Chronic neck pain   . Cervical radiculopathy   . Chronic knee pain   . Rheumatoid arthritis ([place])     "Rhematoid factor positive but no symptoms" per EPIC notes 2012  . Fibromyalgia   . C. difficile diarrhea   . Diabetes mellitus without complication (HCC)     diet controlled  . Chronic abdominal pain     Past Surgical History  Procedure Laterality Date  . Tubal ligation    . Bladder surgery      Family History  Problem Relation Age of Onset  . Cancer Mother   . Diabetes Mother   . Colon cancer Father     AFTER AGE 86  . Crohn's disease Sister    Social History:  reports that she has never smoked. She has never used smokeless tobacco. She reports that she does not drink alcohol or use illicit drugs.  Allergies:  Allergies  Allergen Reactions  . Mango Flavor Anaphylaxis and Swelling    Lips swelling  . Penicillins Anaphylaxis and Hives    Has patient had a PCN reaction causing immediate rash, facial/tongue/throat swelling, SOB or lightheadedness with hypotension: Yes Has patient had a PCN reaction causing severe rash involving mucus membranes or skin necrosis: No Has patient had a PCN reaction that required hospitalization No Has patient had a PCN reaction occurring within the last 10 years: No If all of the  above answers are "NO", then may proceed with Cephalosporin use.   . Tramadol Nausea And Vomiting  . Bactrim [Sulfamethoxazole-Trimethoprim] Itching and Rash  . Keflex [Cephalexin] Hives, Itching and Rash  . Toradol [Ketorolac Tromethamine] Rash    Medications Prior to Admission  Medication Sig Dispense Refill  . acetaminophen (TYLENOL) 500 MG tablet Take 1,000 mg by mouth every 4 (four) hours as needed for moderate pain.    Marland Kitchen HYDROcodone-acetaminophen (NORCO/VICODIN) 5-325 MG tablet Take 1 tablet by mouth once. (Patient taking differently: Take 1 tablet by mouth every 6 (six) hours as needed for moderate pain or severe pain. ) 6 tablet 0  . lisinopril-hydrochlorothiazide (PRINZIDE,ZESTORETIC) 20-25 MG per tablet Take 2 tablets by mouth every morning.     . metoCLOPramide (REGLAN) 10 MG tablet Take 1 tablet (10 mg total) by mouth every 8 (eight) hours as needed for nausea or vomiting. 15 tablet 0  . dicyclomine (BENTYL) 20 MG tablet Take 1 tablet (20 mg total) by mouth 2 (two) times daily. 20 tablet 0  . nitrofurantoin, macrocrystal-monohydrate, (MACROBID) 100 MG capsule Take 1 capsule (100 mg total) by mouth 2 (two) times daily. 14 capsule 0  . promethazine (PHENERGAN) 25 MG tablet Take 1 tablet (25 mg total) by mouth every 6 (six) hours as needed for nausea or vomiting. 8 tablet 0  .  ranitidine (ZANTAC) 150 MG tablet Take 1 tablet (150 mg total) by mouth 2 (two) times daily. 60 tablet 0  . venlafaxine XR (EFFEXOR-XR) 75 MG 24 hr capsule Take 225 mg by mouth at bedtime.       Results for orders placed or performed during the hospital encounter of 10/17/15 (from the past 48 hour(s))  Lipase, blood     Status: None   Collection Time: 10/17/15  1:50 PM  Result Value Ref Range   Lipase 44 11 - 51 U/L  Comprehensive metabolic panel     Status: Abnormal   Collection Time: 10/17/15  1:50 PM  Result Value Ref Range   Sodium 140 135 - 145 mmol/L   Potassium 4.0 3.5 - 5.1 mmol/L   Chloride 105  101 - 111 mmol/L   CO2 28 22 - 32 mmol/L   Glucose, Bld 103 (H) 65 - 99 mg/dL   BUN 11 6 - 20 mg/dL   Creatinine, Ser 0.68 0.44 - 1.00 mg/dL   Calcium 9.2 8.9 - 10.3 mg/dL   Total Protein 7.6 6.5 - 8.1 g/dL   Albumin 4.1 3.5 - 5.0 g/dL   AST 18 15 - 41 U/L   ALT 19 14 - 54 U/L   Alkaline Phosphatase 32 (L) 38 - 126 U/L   Total Bilirubin 0.5 0.3 - 1.2 mg/dL   GFR calc non Af Amer >60 >60 mL/min   GFR calc Af Amer >60 >60 mL/min    Comment: (NOTE) The eGFR has been calculated using the CKD EPI equation. This calculation has not been validated in all clinical situations. eGFR's persistently <60 mL/min signify possible Chronic Kidney Disease.    Anion gap 7 5 - 15  CBC     Status: None   Collection Time: 10/17/15  1:50 PM  Result Value Ref Range   WBC 7.3 4.0 - 10.5 K/uL   RBC 4.63 3.87 - 5.11 MIL/uL   Hemoglobin 13.4 12.0 - 15.0 g/dL   HCT 40.1 36.0 - 46.0 %   MCV 86.6 78.0 - 100.0 fL   MCH 28.9 26.0 - 34.0 pg   MCHC 33.4 30.0 - 36.0 g/dL   RDW 13.5 11.5 - 15.5 %   Platelets 291 150 - 400 K/uL  Surgical pcr screen     Status: None   Collection Time: 10/18/15  3:04 AM  Result Value Ref Range   MRSA, PCR NEGATIVE NEGATIVE   Staphylococcus aureus NEGATIVE NEGATIVE    Comment:        The Xpert SA Assay (FDA approved for NASAL specimens in patients over 72 years of age), is one component of a comprehensive surveillance program.  Test performance has been validated by [facility] for patients greater than or equal to 52 year old. It is not intended to diagnose infection nor to guide or monitor treatment.    US Abdomen Limited Ruq  10/17/2015  CLINICAL DATA:  Right upper quadrant abdominal pain. EXAM: US ABDOMEN LIMITED - RIGHT UPPER QUADRANT COMPARISON:  CT scan of October 12, 2015 FINDINGS: Gallbladder: Multiple gallstones and some degree of sludge are present. Largest stone measures 4.5 mm. No gallbladder wall thickening or pericholecystic fluid is noted. No sonographic  Murphy's sign is noted. Common bile duct: Diameter: 5.7 mm which is within normal limits. Liver: No focal lesion identified. Within normal limits in parenchymal echogenicity. IMPRESSION: Cholelithiasis is noted without evidence of cholecystitis. No other abnormality seen in the right upper quadrant of the abdomen. Electronically Signed  By: Marijo Conception, M.D.   On: 10/17/2015 15:18    Review of Systems  Constitutional: Positive for malaise/fatigue.  HENT: Negative.   Eyes: Negative.   Respiratory: Negative.   Cardiovascular: Negative.   Gastrointestinal: Positive for nausea, vomiting and abdominal pain.  Genitourinary: Negative.   Musculoskeletal: Negative.   Skin: Negative.     Blood pressure 109/72, pulse 80, temperature 98.4 F (36.9 C), temperature source Oral, resp. rate 20, height 5' (1.524 m), weight 92.398 kg (203 lb 11.2 oz), last menstrual period 10/12/2015, SpO2 97 %. Physical Exam  Vitals reviewed. Constitutional: She is oriented to person, place, and time. She appears well-developed and well-nourished.  HENT:  Head: Normocephalic and atraumatic.  Eyes: No scleral icterus.  Neck: Normal range of motion. Neck supple.  Cardiovascular: Normal rate, regular rhythm and normal heart sounds.   Respiratory: Effort normal and breath sounds normal.  GI: Soft. She exhibits no distension. There is tenderness.  Tender in the right upper quadrant to palpation. No rigidity noted.  Neurological: She is alert and oriented to person, place, and time.  Skin: Skin is warm and dry.     Assessment/Plan Impression: Cholecystitis, cholelithiasis Plan: Patient be taken to the operating room for laparoscopic cholecystectomy today. The risks and benefits of the procedure including bleeding, infection, hepatobiliary injury, and the possibility of an open procedure were fully explained to the patient, who gave informed consent.  Jamesetta So, MD 10/18/2015, 7:08 AM

## 2015-10-18 NOTE — Op Note (Signed)
Patient:  Regina Patton  DOB:  08-09-1971  MRN:  [id number]   Preop Diagnosis:  Cholecystitis, cholelithiasis  Postop Diagnosis:  Same  Procedure:  Laparoscopic cholecystectomy  Surgeon:  Franky Macho, M.D.  Anes:  Gen. endotracheal  Indications:  Patient is a 45 year old white female who presented emergency room with right upper quadrant abdominal pain. Ultrasound the gallbladder revealed cholelithiasis. The patient now presents to the operating room for laparoscopic cholecystectomy. The risks and benefits of the procedure including bleeding, infection, hepatobiliary injury, and the possibility of an open procedure were fully explained to the patient, who gave informed consent.  Procedure note:  The patient was placed the supine position. After induction of general endotracheal anesthesia, the abdomen was prepped and draped using the usual sterile technique with DuraPrep. Surgical site confirmation was performed.  A supraumbilical incision was made down to the fascia. A Veress needle was introduced into the abdominal cavity and confirmation of placement was done using the saline drop test. The abdomen was then insufflated to 16 mmHg pressure. An 11 mm trocar was introduced into the abdominal cavity under direct visualization without difficulty. The patient was placed in reverse Trendelenburg position and an additional 11 mm trocar was placed the epigastric region and 5 mm trochars were placed the right upper quadrant and right flank regions. The liver was inspected and noted to be within normal limits. The gallbladder was retracted in a dynamic fashion in order to provide a critical view of the triangle of Calot. The cystic duct was first identified. Its junction to the infundibulum was fully identified. Endoclips were placed proximally and distally on the cystic duct, and the cystic duct was divided. This was likewise done cystic artery. The gallbladder was freed away from the gallbladder  fossa using Bovie electrocautery. The gallbladder was delivered to the epigastric trocar site using an Endo Catch bag. The gallbladder fossa was inspected and no abnormal bleeding or bile leakage was noted. Surgicel was placed the gallbladder fossa. All fluid and air were then evacuated from the abdominal cavity prior to removal of the trochars.  All wounds were irrigated with normal saline. All wounds were injected with 0.5% Sensorcaine. The supraumbilical fascia was reapproximated using 0 Vicryl interrupted suture. All skin incisions were closed using staples. Betadine ointment and dry sterile dressings were applied.  All tape and needle counts were correct the end of the procedure. Patient was extubated in the operating room and transferred to PACU in stable condition.  Complications:  None  EBL:  Minimal  Specimen:  Gallbladder

## 2015-10-19 DIAGNOSIS — K8010 Calculus of gallbladder with chronic cholecystitis without obstruction: Secondary | ICD-10-CM | POA: Diagnosis not present

## 2015-10-19 LAB — BASIC METABOLIC PANEL
Anion gap: 5 (ref 5–15)
CALCIUM: 8.5 mg/dL — AB (ref 8.9–10.3)
CO2: 31 mmol/L (ref 22–32)
CREATININE: 0.68 mg/dL (ref 0.44–1.00)
Chloride: 103 mmol/L (ref 101–111)
GFR calc non Af Amer: >60 mL/min (ref >60)
Glucose, Bld: 111 mg/dL — ABNORMAL HIGH (ref 65–99)
Potassium: 3.4 mmol/L — ABNORMAL LOW (ref 3.5–5.1)
Sodium: 139 mmol/L (ref 135–145)

## 2015-10-19 LAB — CBC
HCT: 34.4 % — ABNORMAL LOW (ref 36.0–46.0)
Hemoglobin: 11.3 g/dL — ABNORMAL LOW (ref 12.0–15.0)
MCH: 29.4 pg (ref 26.0–34.0)
MCHC: 32.8 g/dL (ref 30.0–36.0)
MCV: 89.4 fL (ref 78.0–100.0)
PLATELETS: 231 10*3/uL (ref 150–400)
RBC: 3.85 MIL/uL — AB (ref 3.87–5.11)
RDW: 13.5 % (ref 11.5–15.5)
WBC: 6.9 10*3/uL (ref 4.0–10.5)

## 2015-10-19 LAB — HEPATIC FUNCTION PANEL
ALT: 29 U/L (ref 14–54)
AST: 49 U/L — ABNORMAL HIGH (ref 15–41)
Albumin: 3.3 g/dL — ABNORMAL LOW (ref 3.5–5.0)
Alkaline Phosphatase: 31 U/L — ABNORMAL LOW (ref 38–126)
BILIRUBIN DIRECT: 0.1 mg/dL (ref 0.1–0.5)
BILIRUBIN INDIRECT: 0.2 mg/dL — AB (ref 0.3–0.9)
TOTAL PROTEIN: 6.2 g/dL — AB (ref 6.5–8.1)
Total Bilirubin: 0.3 mg/dL (ref 0.3–1.2)

## 2015-10-19 MED ORDER — OXYCODONE-ACETAMINOPHEN 7.5-325 MG PO TABS: 1.0000 | ORAL_TABLET | ORAL | Status: DC | PRN

## 2015-10-19 NOTE — Discharge Instructions (Signed)
Laparoscopic Cholecystectomy, Care After °Refer to this sheet in the next few weeks. These instructions provide you with information about caring for yourself after your procedure. Your health care provider may also give you more specific instructions. Your treatment has been planned according to current medical practices, but problems sometimes occur. Call your health care provider if you have any problems or questions after your procedure. °WHAT TO EXPECT AFTER THE PROCEDURE °After your procedure, it is common to have: °· Pain at your incision sites. You will be given pain medicines to control your pain. °· Mild nausea or vomiting. This should improve after the first 24 hours. °· Bloating and possible shoulder pain from the gas that was used during the procedure. This will improve after the first 24 hours. °HOME CARE INSTRUCTIONS °Incision Care °· Follow instructions from your health care provider about how to take care of your incisions. Make sure you: °¨ Wash your hands with soap and water before you change your bandage (dressing). If soap and water are not available, use hand sanitizer. °¨ Change your dressing as told by your health care provider. °¨ Leave stitches (sutures), skin glue, or adhesive strips in place. These skin closures may need to be in place for 2 weeks or longer. If adhesive strip edges start to loosen and curl up, you may trim the loose edges. Do not remove adhesive strips completely unless your health care provider tells you to do that. °· Do not take baths, swim, or use a hot tub until your health care provider approves. Ask your health care provider if you can take showers. You may only be allowed to take sponge baths for bathing. °General Instructions °· Take over-the-counter and prescription medicines only as told by your health care provider. °· Do not drive or operate heavy machinery while taking prescription pain medicine. °· Return to your normal diet as told by your health care  provider. °· Do not lift anything that is heavier than 10 lb (4.5 kg). °· Do not play contact sports for one week or until your health care provider approves. °SEEK MEDICAL CARE IF:  °· You have redness, swelling, or pain at the site of your incision. °· You have fluid, blood, or pus coming from your incision. °· You notice a bad smell coming from your incision area. °· Your surgical incisions break open. °· You have a fever. °SEEK IMMEDIATE MEDICAL CARE IF: °· You develop a rash. °· You have difficulty breathing. °· You have chest pain. °· You have increasing pain in your shoulders (shoulder strap areas). °· You faint or have dizzy episodes while you are standing. °· You have severe pain in your abdomen. °· You have nausea or vomiting that lasts for more than one day. °  °This information is not intended to replace advice given to you by your health care provider. Make sure you discuss any questions you have with your health care provider. °  °Document Released: 08/24/2005 Document Revised: 05/15/2015 Document Reviewed: 04/05/2013 °Elsevier Interactive Patient Education ©2016 [employer] ° °

## 2015-10-19 NOTE — Addendum Note (Signed)
Addendum  created 10/19/15 1018 by Despina Hidden, CRNA   Modules edited: Clinical Notes   Clinical Notes:  File: [id number]

## 2015-10-19 NOTE — Discharge Summary (Signed)
Physician Discharge Summary  Patient ID: Regina Patton MRN: [id number] DOB/AGE: 07-Jan-1971 45 y.o.  Admit date: 10/17/2015 Discharge date: 10/19/2015  Admission Diagnoses: Cholecystitis, cholelithiasis  Discharge Diagnoses: Same Active Problems:   Cholecystitis with cholelithiasis  fibromyalgia, hypertension  Discharged Condition: good  Hospital Course: Patient is a 45 year old white female who presented emergency room with worsening right upper quadrant abdominal pain. The gallbladder revealed cholelithiasis. Due to unrelenting pain and nausea, the patient was admitted to the hospital for further evaluation treatment. She subsequently underwent laparoscopic cholecystectomy on 10/18/2015. She tolerated the procedure well. Her postoperative course was unremarkable. Her diet was advanced without difficulty. The patient is being discharged home on 10/19/2015 in good and improving condition.  Treatments: surgery: Laparoscopic cholecystectomy on 10/18/2015  Discharge Exam: Blood pressure 116/57, pulse 72, temperature 97.9 F (36.6 C), temperature source Oral, resp. rate 16, height 5' (1.524 m), weight 92.08 kg (203 lb), last menstrual period 10/12/2015, SpO2 100 %. General appearance: alert, cooperative and no distress Resp: clear to auscultation bilaterally Cardio: regular rate and rhythm, S1, S2 normal, no murmur, click, rub or gallop GI: Soft, incisions healing well.  Disposition: 01-Home or Self Care     Medication List    STOP taking these medications        HYDROcodone-acetaminophen 5-325 MG tablet  Commonly known as:  NORCO/VICODIN     venlafaxine XR 75 MG 24 hr capsule  Commonly known as:  EFFEXOR-XR      TAKE these medications        acetaminophen 500 MG tablet  Commonly known as:  TYLENOL  Take 1,000 mg by mouth every 4 (four) hours as needed for moderate pain.     dicyclomine 20 MG tablet  Commonly known as:  BENTYL  Take 1 tablet (20 mg total) by mouth 2 (two)  times daily.     lisinopril-hydrochlorothiazide 20-25 MG tablet  Commonly known as:  PRINZIDE,ZESTORETIC  Take 2 tablets by mouth every morning.     metoCLOPramide 10 MG tablet  Commonly known as:  REGLAN  Take 1 tablet (10 mg total) by mouth every 8 (eight) hours as needed for nausea or vomiting.     nitrofurantoin (macrocrystal-monohydrate) 100 MG capsule  Commonly known as:  MACROBID  Take 1 capsule (100 mg total) by mouth 2 (two) times daily.     oxyCODONE-acetaminophen 7.5-325 MG tablet  Commonly known as:  PERCOCET  Take 1-2 tablets by mouth every 4 (four) hours as needed.     promethazine 25 MG tablet  Commonly known as:  PHENERGAN  Take 1 tablet (25 mg total) by mouth every 6 (six) hours as needed for nausea or vomiting.     ranitidine 150 MG tablet  Commonly known as:  ZANTAC  Take 1 tablet (150 mg total) by mouth 2 (two) times daily.           Follow-up Information    Follow up with Dalia Heading, MD. Schedule an appointment as soon as possible for a visit on 10/29/2015.   Specialty:  General Surgery   Contact information:   [address] [place] [address] [phone]       Signed: Franky Macho A 10/19/2015, 10:29 AM

## 2015-10-19 NOTE — Progress Notes (Signed)
Patient discharged with instructions, prescription, and care notes.  Verbalized understanding via teach back.  IV was removed and the site was WNL. Patient voiced no further complaints or concerns at the time of discharge.  Appointments scheduled per instructions.  Patient left the floor via w/c with staff and family in stable condition. 

## 2015-10-19 NOTE — Anesthesia Postprocedure Evaluation (Signed)
Anesthesia Post Note  Patient: Regina Patton  Procedure(s) Performed: Procedure(s) (LRB): LAPAROSCOPIC CHOLECYSTECTOMY (N/A)  Patient location during evaluation: Nursing Unit Anesthesia Type: General Level of consciousness: awake and alert, oriented and patient cooperative Pain management: pain level controlled Vital Signs Assessment: post-procedure vital signs reviewed and stable Respiratory status: spontaneous breathing Cardiovascular status: blood pressure returned to baseline Postop Assessment: adequate PO intake and no signs of nausea or vomiting Anesthetic complications: no    Last Vitals:  Filed Vitals:   10/19/15 0200 10/19/15 0553  BP: 124/72 116/57  Pulse: 82 72  Temp: 36.4 C 36.6 C  Resp: 16 16    Last Pain:  Filed Vitals:   10/19/15 0558  PainSc: 8                  Carlos Quackenbush J

## 2015-10-21 ENCOUNTER — Encounter (HOSPITAL_COMMUNITY): Payer: Self-pay | Admitting: General Surgery

## 2015-10-26 ENCOUNTER — Telehealth (HOSPITAL_COMMUNITY): Payer: Self-pay

## 2015-10-26 NOTE — Telephone Encounter (Signed)
Unable to contact pt by mail or telephone. Unable to communicate lab results or treatment changes. 

## 2015-11-26 ENCOUNTER — Emergency Department (HOSPITAL_COMMUNITY)
Admission: EM | Admit: 2015-11-26 | Discharge: 2015-11-26 | Disposition: A | Discharge status: Home / Self Care | Payer: Medicare Other | Attending: Emergency Medicine | Admitting: Emergency Medicine

## 2015-11-26 ENCOUNTER — Encounter (HOSPITAL_COMMUNITY): Payer: Self-pay | Admitting: Emergency Medicine

## 2015-11-26 DIAGNOSIS — Z9851 Tubal ligation status: Secondary | ICD-10-CM | POA: Diagnosis not present

## 2015-11-26 DIAGNOSIS — R110 Nausea: Secondary | ICD-10-CM | POA: Diagnosis not present

## 2015-11-26 DIAGNOSIS — N390 Urinary tract infection, site not specified: Secondary | ICD-10-CM

## 2015-11-26 DIAGNOSIS — M069 Rheumatoid arthritis, unspecified: Secondary | ICD-10-CM | POA: Diagnosis not present

## 2015-11-26 DIAGNOSIS — E119 Type 2 diabetes mellitus without complications: Secondary | ICD-10-CM | POA: Diagnosis not present

## 2015-11-26 DIAGNOSIS — R1033 Periumbilical pain: Secondary | ICD-10-CM | POA: Diagnosis present

## 2015-11-26 DIAGNOSIS — Z79899 Other long term (current) drug therapy: Secondary | ICD-10-CM | POA: Diagnosis not present

## 2015-11-26 DIAGNOSIS — Z9049 Acquired absence of other specified parts of digestive tract: Secondary | ICD-10-CM | POA: Diagnosis not present

## 2015-11-26 DIAGNOSIS — I10 Essential (primary) hypertension: Secondary | ICD-10-CM | POA: Insufficient documentation

## 2015-11-26 LAB — COMPREHENSIVE METABOLIC PANEL
ALK PHOS: 33 U/L — AB (ref 38–126)
ALT: 18 U/L (ref 14–54)
ANION GAP: 9 (ref 5–15)
AST: 21 U/L (ref 15–41)
Albumin: 4 g/dL (ref 3.5–5.0)
BILIRUBIN TOTAL: 1.2 mg/dL (ref 0.3–1.2)
BUN: 9 mg/dL (ref 6–20)
CALCIUM: 9 mg/dL (ref 8.9–10.3)
CO2: 23 mmol/L (ref 22–32)
CREATININE: 0.91 mg/dL (ref 0.44–1.00)
Chloride: 103 mmol/L (ref 101–111)
GFR calc non Af Amer: >60 mL/min (ref >60)
Glucose, Bld: 95 mg/dL (ref 65–99)
Potassium: 3.6 mmol/L (ref 3.5–5.1)
Sodium: 135 mmol/L (ref 135–145)
TOTAL PROTEIN: 8.4 g/dL — AB (ref 6.5–8.1)

## 2015-11-26 LAB — URINE MICROSCOPIC-ADD ON

## 2015-11-26 LAB — URINALYSIS, ROUTINE W REFLEX MICROSCOPIC
Glucose, UA: NEGATIVE mg/dL
KETONES UR: 40 mg/dL — AB
Nitrite: NEGATIVE
PH: 6 (ref 5.0–8.0)
Protein, ur: 30 mg/dL — AB
SPECIFIC GRAVITY, URINE: 1.03 (ref 1.005–1.030)

## 2015-11-26 LAB — CBC
HCT: 41.4 % (ref 36.0–46.0)
HEMOGLOBIN: 13.8 g/dL (ref 12.0–15.0)
MCH: 28.3 pg (ref 26.0–34.0)
MCHC: 33.3 g/dL (ref 30.0–36.0)
MCV: 84.8 fL (ref 78.0–100.0)
Platelets: 286 10*3/uL (ref 150–400)
RBC: 4.88 MIL/uL (ref 3.87–5.11)
RDW: 14 % (ref 11.5–15.5)
WBC: 12.3 10*3/uL — ABNORMAL HIGH (ref 4.0–10.5)

## 2015-11-26 LAB — I-STAT BETA HCG BLOOD, ED (MC, WL, AP ONLY): I-stat hCG, quantitative: <5 m[IU]/mL (ref <5)

## 2015-11-26 LAB — LIPASE, BLOOD: Lipase: 44 U/L (ref 11–51)

## 2015-11-26 MED ORDER — PHENAZOPYRIDINE HCL 200 MG PO TABS: 200.0000 mg | ORAL_TABLET | Freq: Three times a day (TID) | ORAL | Status: DC

## 2015-11-26 MED ORDER — CIPROFLOXACIN IN D5W 400 MG/200ML IV SOLN
400.0000 mg | Freq: Two times a day (BID) | INTRAVENOUS | Status: DC
  Administered 2015-11-26: 400 mg via INTRAVENOUS
  Filled 2015-11-26: qty 200

## 2015-11-26 MED ORDER — PROMETHAZINE HCL 25 MG/ML IJ SOLN
12.5000 mg | Freq: Once | INTRAMUSCULAR | Status: AC
  Administered 2015-11-26: 12.5 mg via INTRAVENOUS
  Filled 2015-11-26: qty 1

## 2015-11-26 MED ORDER — SODIUM CHLORIDE 0.9 % IV SOLN
INTRAVENOUS | Status: DC
  Administered 2015-11-26: 21:00:00 via INTRAVENOUS

## 2015-11-26 MED ORDER — SODIUM CHLORIDE 0.9 % IV BOLUS (SEPSIS)
1000.0000 mL | Freq: Once | INTRAVENOUS | Status: AC
  Administered 2015-11-26: 1000 mL via INTRAVENOUS

## 2015-11-26 MED ORDER — CIPROFLOXACIN HCL 500 MG PO TABS: 500.0000 mg | ORAL_TABLET | Freq: Two times a day (BID) | ORAL | Status: DC

## 2015-11-26 MED ORDER — HYDROMORPHONE HCL 1 MG/ML IJ SOLN
0.5000 mg | INTRAMUSCULAR | Status: DC | PRN
  Administered 2015-11-26: 0.5 mg via INTRAVENOUS
  Filled 2015-11-26: qty 1

## 2015-11-26 NOTE — Discharge Instructions (Signed)

## 2015-11-26 NOTE — ED Provider Notes (Signed)
CSN: [id number]     Arrival date & time 11/26/15  1702 History   First MD Initiated Contact with Patient 11/26/15 1848     Chief Complaint  Patient presents with  . Abdominal Pain   Patient is a 45 y.o. female presenting with abdominal pain. The history is provided by the patient.  Abdominal Pain Pain location:  Periumbilical Pain quality: aching   Pain radiation: right side of abdomen. Pain severity:  Moderate Onset quality:  Gradual Duration:  1 day Timing:  Constant Progression:  Worsening Worsened by:  Movement Associated symptoms: anorexia and nausea   Associated symptoms: no diarrhea, no dysuria, no fatigue, no fever, no vaginal bleeding, no vaginal discharge and no vomiting     Past Medical History  Diagnosis Date  . Hypertension   . Chronic back pain   . Sciatic pain     right  . Rib pain on right side     chronic  . Chronic neck pain   . Cervical radiculopathy   . Chronic knee pain   . Rheumatoid arthritis ([facility])     "Rhematoid factor positive but no symptoms" per EPIC notes 2012  . Fibromyalgia   . C. difficile diarrhea   . Diabetes mellitus without complication ([facility])     diet controlled  . Chronic abdominal pain    Past Surgical History  Procedure Laterality Date  . Tubal ligation    . Bladder surgery    . Cholecystectomy N/A 10/18/2015    Procedure: LAPAROSCOPIC CHOLECYSTECTOMY;  Surgeon: Franky Macho, MD;  Location: [facility];  Service: General;  Laterality: N/A;   Family History  Problem Relation Age of Onset  . Cancer Mother   . Diabetes Mother   . Colon cancer Father     AFTER AGE 68  . Crohn's disease Sister    Social History  Substance Use Topics  . Smoking status: Never Smoker   . Smokeless tobacco: Never Used  . Alcohol Use: No   OB History    Gravida Para Term Preterm AB TAB SAB Ectopic Multiple Living   3 3 3       3      Review of Systems  Constitutional: Negative for fever and fatigue.  Gastrointestinal: Positive for nausea,  abdominal pain and anorexia. Negative for vomiting and diarrhea.  Genitourinary: Negative for dysuria, vaginal bleeding and vaginal discharge.  All other systems reviewed and are negative.     Allergies  Mango flavor; Penicillins; Tramadol; Zofran; Bactrim; Keflex; and Toradol  Home Medications   Prior to Admission medications   Medication Sig Start Date End Date Taking? Authorizing Provider  acetaminophen (TYLENOL) 500 MG tablet Take 1,000 mg by mouth every 4 (four) hours as needed for moderate pain.   Yes Historical Provider, MD  lisinopril-hydrochlorothiazide (PRINZIDE,ZESTORETIC) 20-25 MG per tablet Take 2 tablets by mouth every morning.    Yes Historical Provider, MD  ciprofloxacin (CIPRO) 500 MG tablet Take 1 tablet (500 mg total) by mouth every 12 (twelve) hours. 11/26/15   11/28/15, MD  phenazopyridine (PYRIDIUM) 200 MG tablet Take 1 tablet (200 mg total) by mouth 3 (three) times daily. 11/26/15   11/28/15, MD   BP 128/85 mmHg  Pulse 105  Temp(Src) 99.6 F (37.6 C) (Oral)  Resp 22  Ht 5' (1.524 m)  Wt 91.627 kg  BMI 39.45 kg/m2  SpO2 100%  LMP 11/25/2015 Physical Exam  Constitutional: No distress.  HENT:  Head: Normocephalic and atraumatic.  Right Ear: External  ear normal.  Left Ear: External ear normal.  Eyes: Conjunctivae are normal. Right eye exhibits no discharge. Left eye exhibits no discharge. No scleral icterus.  Neck: Neck supple. No tracheal deviation present.  Cardiovascular: Normal rate, regular rhythm and intact distal pulses.   Pulmonary/Chest: Effort normal and breath sounds normal. No stridor. No respiratory distress. She has no wheezes. She has no rales.  Abdominal: Soft. Bowel sounds are normal. She exhibits no distension. There is tenderness in the periumbilical area. There is no rebound and no guarding. No hernia.  Musculoskeletal: She exhibits no edema or tenderness.  Neurological: She is alert. She has normal strength. No cranial nerve deficit (no  facial droop, extraocular movements intact, no slurred speech) or sensory deficit. She exhibits normal muscle tone. She displays no seizure activity. Coordination normal.  Skin: Skin is warm and dry. No rash noted.  Psychiatric: She has a normal mood and affect.  Nursing note and vitals reviewed.   ED Course  Procedures (including critical care time) Labs Review Labs Reviewed  COMPREHENSIVE METABOLIC PANEL - Abnormal; Notable for the following:    Total Protein 8.4 (*)    Alkaline Phosphatase 33 (*)    All other components within normal limits  CBC - Abnormal; Notable for the following:    WBC 12.3 (*)    All other components within normal limits  URINALYSIS, ROUTINE W REFLEX MICROSCOPIC (NOT AT [facility]) - Abnormal; Notable for the following:    APPearance CLOUDY (*)    Hgb urine dipstick LARGE (*)    Bilirubin Urine MODERATE (*)    Ketones, ur 40 (*)    Protein, ur 30 (*)    Leukocytes, UA LARGE (*)    All other components within normal limits  URINE MICROSCOPIC-ADD ON - Abnormal; Notable for the following:    Squamous Epithelial / LPF TOO NUMEROUS TO COUNT (*)    Bacteria, UA MANY (*)    All other components within normal limits  LIPASE, BLOOD  I-STAT BETA HCG BLOOD, ED ([facility], [facility], [facility] ONLY)   Medications  sodium chloride 0.9 % bolus 1,000 mL (1,000 mLs Intravenous New Bag/Given 11/26/15 1934)    And  0.9 %  sodium chloride infusion (not administered)  HYDROmorphone (DILAUDID) injection 0.5 mg (0.5 mg Intravenous Given 11/26/15 1934)  ciprofloxacin (CIPRO) IVPB 400 mg (not administered)  promethazine (PHENERGAN) injection 12.5 mg (12.5 mg Intravenous Given 11/26/15 1933)     MDM   Final diagnoses:  UTI (lower urinary tract infection)   UA consistent with uti.  WBC increased.  Labs otherwise reassuring.  Suspect UTI although could be an early pyelo.  Will give dose of IV cipro and dc home with oral cipro rx.  Follow up with PCP this week.    Linwood Dibbles, MD 11/26/15 2059

## 2015-11-26 NOTE — ED Notes (Signed)
Right lower abd pain with fever since yesterday per patient. States pain is getting worse. Last tylenol was 1 hour ago. Nausea but no vomiting.

## 2015-12-16 ENCOUNTER — Emergency Department (HOSPITAL_COMMUNITY): Payer: Medicare Other

## 2015-12-16 ENCOUNTER — Emergency Department (HOSPITAL_COMMUNITY)
Admission: EM | Admit: 2015-12-16 | Discharge: 2015-12-16 | Disposition: A | Discharge status: Home / Self Care | Payer: Medicare Other | Attending: Emergency Medicine | Admitting: Emergency Medicine

## 2015-12-16 ENCOUNTER — Encounter (HOSPITAL_COMMUNITY): Payer: Self-pay | Admitting: *Deleted

## 2015-12-16 DIAGNOSIS — E119 Type 2 diabetes mellitus without complications: Secondary | ICD-10-CM | POA: Insufficient documentation

## 2015-12-16 DIAGNOSIS — M5137 Other intervertebral disc degeneration, lumbosacral region: Secondary | ICD-10-CM

## 2015-12-16 DIAGNOSIS — G8929 Other chronic pain: Secondary | ICD-10-CM | POA: Insufficient documentation

## 2015-12-16 DIAGNOSIS — M5136 Other intervertebral disc degeneration, lumbar region: Secondary | ICD-10-CM | POA: Insufficient documentation

## 2015-12-16 DIAGNOSIS — Z79899 Other long term (current) drug therapy: Secondary | ICD-10-CM | POA: Diagnosis not present

## 2015-12-16 DIAGNOSIS — M549 Dorsalgia, unspecified: Secondary | ICD-10-CM | POA: Diagnosis present

## 2015-12-16 DIAGNOSIS — I10 Essential (primary) hypertension: Secondary | ICD-10-CM | POA: Insufficient documentation

## 2015-12-16 MED ORDER — HYDROCODONE-ACETAMINOPHEN 5-325 MG PO TABS: 1.0000 | ORAL_TABLET | ORAL | Status: DC | PRN

## 2015-12-16 MED ORDER — PREDNISONE 10 MG PO TABS: ORAL_TABLET | ORAL | Status: DC

## 2015-12-16 MED ORDER — HYDROCODONE-ACETAMINOPHEN 5-325 MG PO TABS
1.0000 | ORAL_TABLET | Freq: Once | ORAL | Status: AC
  Administered 2015-12-16: 1 via ORAL
  Filled 2015-12-16: qty 1

## 2015-12-16 MED ORDER — PREDNISONE 50 MG PO TABS
60.0000 mg | ORAL_TABLET | Freq: Once | ORAL | Status: AC
Start: 2015-12-16 — End: 2015-12-16
  Administered 2015-12-16: 60 mg via ORAL
  Filled 2015-12-16: qty 1

## 2015-12-16 NOTE — ED Notes (Signed)
PT last period started 12/15/15 and had a tubal ligation. Xray made aware and POC preg d/ced.

## 2015-12-16 NOTE — ED Notes (Signed)
Pt states she has chronic back pain with worsening in the last 3 days. Pt states that she has had some tingling in both legs. NAD noted. Pt able to ambulate.

## 2015-12-16 NOTE — ED Provider Notes (Signed)
CSN: [id number]     Arrival date & time 12/16/15  6010 History   First MD Initiated Contact with Patient 12/16/15 0801     Chief Complaint  Patient presents with  . Back Pain     (Consider location/radiation/quality/duration/timing/severity/associated sxs/prior Treatment) The history is provided by the patient.   Regina Patton is a 45 y.o. female with a history of low back pain and intermittent sciatica which is usually right sided, presenting with a 3 day history of worsened low back pain with radiation of pain into her bilateral lower posterior thighs.  She denies any specific injury, but noticed the pain slowly progress after doing her laundry at a laundromat, had lifted several baskets of clothing.  There has been no weakness or numbness in the lower extremities but describes that they feel "heavy"  and no urinary or bowel retention or incontinence.  Patient does not have a history of cancer or IVDU.  The patient has tried tylenol without significant relief of symptoms.    Past Medical History  Diagnosis Date  . Hypertension   . Chronic back pain   . Sciatic pain     right  . Rib pain on right side     chronic  . Chronic neck pain   . Cervical radiculopathy   . Chronic knee pain   . Rheumatoid arthritis (HCC)     "Rhematoid factor positive but no symptoms" per EPIC notes 2012  . Fibromyalgia   . C. difficile diarrhea   . Diabetes mellitus without complication (HCC)     diet controlled  . Chronic abdominal pain    Past Surgical History  Procedure Laterality Date  . Tubal ligation    . Bladder surgery    . Cholecystectomy N/A 10/18/2015    Procedure: LAPAROSCOPIC CHOLECYSTECTOMY;  Surgeon: Franky Macho, MD;  Location: [facility];  Service: General;  Laterality: N/A;   Family History  Problem Relation Age of Onset  . Cancer Mother   . Diabetes Mother   . Colon cancer Father     AFTER AGE 68  . Crohn's disease Sister    Social History  Substance Use Topics  . Smoking  status: Never Smoker   . Smokeless tobacco: Never Used  . Alcohol Use: No   OB History    Gravida Para Term Preterm AB TAB SAB Ectopic Multiple Living   3 3 3       3      Review of Systems  Constitutional: Negative for fever.  Respiratory: Negative for shortness of breath.   Cardiovascular: Negative for chest pain and leg swelling.  Gastrointestinal: Negative for abdominal pain, constipation and abdominal distention.  Genitourinary: Negative for dysuria, urgency, frequency, flank pain and difficulty urinating.  Musculoskeletal: Positive for back pain. Negative for joint swelling and gait problem.  Skin: Negative for rash.  Neurological: Negative for weakness and numbness.      Allergies  Mango flavor; Penicillins; Tramadol; Zofran; Bactrim; Keflex; and Toradol  Home Medications   Prior to Admission medications   Medication Sig Start Date End Date Taking? Authorizing Provider  acetaminophen (TYLENOL) 500 MG tablet Take 1,000 mg by mouth every 4 (four) hours as needed for moderate pain.   Yes Historical Provider, MD  ciprofloxacin (CIPRO) 500 MG tablet Take 1 tablet (500 mg total) by mouth every 12 (twelve) hours. Patient not taking: Reported on 12/16/2015 11/26/15   11/28/15, MD  HYDROcodone-acetaminophen (NORCO/VICODIN) 5-325 MG tablet Take 1 tablet by mouth every 4 (  four) hours as needed. 12/16/15   Burgess Amor, PA-C  phenazopyridine (PYRIDIUM) 200 MG tablet Take 1 tablet (200 mg total) by mouth 3 (three) times daily. Patient not taking: Reported on 12/16/2015 11/26/15   Linwood Dibbles, MD  predniSONE (DELTASONE) 10 MG tablet 6, 5, 4, 3, 2 then 1 tablet by mouth daily for 6 days total. 12/16/15   Burgess Amor, PA-C   BP 124/90 mmHg  Pulse 84  Temp(Src) 97.7 F (36.5 C) (Oral)  Resp 16  Ht 5' (1.524 m)  Wt 87.091 kg  BMI 37.50 kg/m2  SpO2 98%  LMP 12/16/2015 Physical Exam  Constitutional: She appears well-developed and well-nourished.  HENT:  Head: Normocephalic.  Eyes:  Conjunctivae are normal.  Neck: Normal range of motion. Neck supple.  Cardiovascular: Normal rate and intact distal pulses.   Pedal pulses normal.  Pulmonary/Chest: Effort normal.  Abdominal: Soft. Bowel sounds are normal. She exhibits no distension and no mass.  Musculoskeletal: Normal range of motion. She exhibits no edema.       Lumbar back: She exhibits tenderness and bony tenderness. She exhibits no swelling, no edema, no deformity and no spasm.  Midline lumbar and bilateral paralumbar ttp.   Neurological: She is alert. She has normal strength. She displays no atrophy and no tremor. No sensory deficit. Gait normal.  Reflex Scores:      Patellar reflexes are 2+ on the right side and 2+ on the left side.      Achilles reflexes are 2+ on the right side and 2+ on the left side. No strength deficit noted in hip and knee flexor and extensor muscle groups.  Ankle flexion and extension intact.  Skin: Skin is warm and dry.  Psychiatric: She has a normal mood and affect.  Nursing note and vitals reviewed.   ED Course  Procedures (including critical care time) Labs Review Labs Reviewed - No data to display  Imaging Review Dg Lumbar Spine Complete  12/16/2015  CLINICAL DATA:  Low back pain for 3 days radiating down both legs for 1 day. No known injury. EXAM: LUMBAR SPINE - COMPLETE 4+ VIEW COMPARISON:  CT 10/12/2015 FINDINGS: Slight disc space narrowing at L5-S1. Alignment is normal. No fracture. SI joints are symmetric and unremarkable. IMPRESSION: No acute bony abnormality. Electronically Signed   By: Charlett Nose M.D.   On: 12/16/2015 09:58   I have personally reviewed and evaluated these images and lab results as part of my medical decision-making.   EKG Interpretation None      MDM   Final diagnoses:  DDD (degenerative disc disease), lumbosacral    No neuro deficit on exam or by history to suggest emergent or surgical presentation.  Also discussed worsened sx that should prompt  immediate re-evaluation including distal weakness, bowel/bladder retention/incontinence.    Imaging reviewed,  Pt was placed on prednisone taper, first dose given here.  She had no relief of pain while in dept.  Few hydrocodone prescribed.  Referrals given for establishing pcp as pt is a frequenter of this ed.    [place] controlled substance database reviewed. She has received narcotic prescriptions from this dept only.  Encouraged obtaining pcp.            Burgess Amor, PA-C 12/16/15 1139  Vanetta Mulders, MD 12/16/15 [phone]

## 2015-12-16 NOTE — ED Notes (Signed)
PA at bedside.

## 2015-12-16 NOTE — Discharge Instructions (Signed)
Degenerative Disk Disease  Degenerative disk disease is a condition caused by the changes that occur in spinal disks as you grow older. Spinal disks are soft and compressible disks located between the bones of your spine (vertebrae). These disks act like shock absorbers. Degenerative disk disease can affect the whole spine. However, the neck and lower back are most commonly affected. Many changes can occur in the spinal disks with aging, such as:  · The spinal disks may dry and shrink.  · Small tears may occur in the tough, outer covering of the disk (annulus).  · The disk space may become smaller due to loss of water.  · Abnormal growths in the bone (spurs) may occur. This can put pressure on the nerve roots exiting the spinal canal, causing pain.  · The spinal canal may become narrowed.  RISK FACTORS   · Being overweight.  · Having a family history of degenerative disk disease.  · Smoking.  · There is increased risk if you are doing heavy lifting or have a sudden injury.  SIGNS AND SYMPTOMS   Symptoms vary from person to person and may include:  · Pain that varies in intensity. Some people have no pain, while others have severe pain. The location of the pain depends on the part of your backbone that is affected.  ¨ You will have neck or arm pain if a disk in the neck area is affected.  ¨ You will have pain in your back, buttocks, or legs if a disk in the lower back is affected.  · Pain that becomes worse while bending, reaching up, or with twisting movements.  · Pain that may start gradually and then get worse as time passes. It may also start after a major or minor injury.  · Numbness or tingling in the arms or legs.  DIAGNOSIS   Your health care provider will ask you about your symptoms and about activities or habits that may cause the pain. He or she may also ask about any injuries, diseases, or treatments you have had. Your health care provider will examine you to check for the range of movement that is  possible in the affected area, to check for strength in your extremities, and to check for sensation in the areas of the arms and legs supplied by different nerve roots. You may also have:   · An X-ray of the spine.  · Other imaging tests, such as MRI.  TREATMENT   Your health care provider will advise you on the best plan for treatment. Treatment may include:  · Medicines.  · Rehabilitation exercises.  HOME CARE INSTRUCTIONS   · Follow proper lifting and walking techniques as advised by your health care provider.  · Maintain good posture.  · Exercise regularly as advised by your health care provider.  · Perform relaxation exercises.  · Change your sitting, standing, and sleeping habits as advised by your health care provider.  · Change positions frequently.  · Lose weight or maintain a healthy weight as advised by your health care provider.  · Do not use any tobacco products, including cigarettes, chewing tobacco, or electronic cigarettes. If you need help quitting, ask your health care provider.  · Wear supportive footwear.  · Take medicines only as directed by your health care provider.  SEEK MEDICAL CARE IF:   · Your pain does not go away within 1-4 weeks.  · You have significant appetite or weight loss.  SEEK IMMEDIATE MEDICAL CARE IF:   ·   instructions.  Will watch your condition.  Will get help right away if you are not doing well or get worse.   This information is not intended to replace advice given to you by your health care provider. Make sure you discuss any questions you have with your health care provider.   Document Released: 06/21/2007 Document Revised: 09/14/2014 Document Reviewed: 12/26/2013 Elsevier Interactive Patient Education 2016 [employer].    Take your next dose of  prednisone tomorrow morning.  Use the the other medicines as directed.  Do not drive within 4 hours of taking hydrocodone as this will make you drowsy.  Avoid lifting,  Bending,  Twisting or any other activity that worsens your pain over the next week.  Apply an  icepack  to your lower back for 10-15 minutes every 2 hours for the next 2 days.  You should get rechecked if your symptoms are not better over the next 5 days,  Or you develop increased pain,  Weakness in your leg(s) or loss of bladder or bowel function - these are symptoms of a worse injury.

## 2015-12-19 ENCOUNTER — Emergency Department (HOSPITAL_COMMUNITY)
Admission: EM | Admit: 2015-12-19 | Discharge: 2015-12-20 | Disposition: A | Discharge status: Home / Self Care | Payer: Medicare Other | Attending: Emergency Medicine | Admitting: Emergency Medicine

## 2015-12-19 ENCOUNTER — Encounter (HOSPITAL_COMMUNITY): Payer: Self-pay

## 2015-12-19 DIAGNOSIS — M5416 Radiculopathy, lumbar region: Secondary | ICD-10-CM

## 2015-12-19 DIAGNOSIS — M069 Rheumatoid arthritis, unspecified: Secondary | ICD-10-CM | POA: Insufficient documentation

## 2015-12-19 DIAGNOSIS — I10 Essential (primary) hypertension: Secondary | ICD-10-CM | POA: Insufficient documentation

## 2015-12-19 DIAGNOSIS — M545 Low back pain: Secondary | ICD-10-CM | POA: Diagnosis present

## 2015-12-19 DIAGNOSIS — E119 Type 2 diabetes mellitus without complications: Secondary | ICD-10-CM | POA: Diagnosis not present

## 2015-12-19 NOTE — ED Notes (Signed)
Pt reports having lower back pain radiating down legs. Pain reported to start 3 days ago.

## 2015-12-20 DIAGNOSIS — M5416 Radiculopathy, lumbar region: Secondary | ICD-10-CM | POA: Diagnosis not present

## 2015-12-20 MED ORDER — HYDROMORPHONE HCL 1 MG/ML IJ SOLN
1.0000 mg | Freq: Once | INTRAMUSCULAR | Status: AC
Start: 2015-12-20 — End: 2015-12-20
  Administered 2015-12-20: 1 mg via INTRAMUSCULAR
  Filled 2015-12-20: qty 1

## 2015-12-20 MED ORDER — OXYCODONE-ACETAMINOPHEN 5-325 MG PO TABS: 1.0000 | ORAL_TABLET | ORAL | Status: DC | PRN

## 2015-12-20 MED ORDER — METHOCARBAMOL 500 MG PO TABS: 500.0000 mg | ORAL_TABLET | Freq: Four times a day (QID) | ORAL | Status: AC

## 2015-12-20 MED ORDER — METHOCARBAMOL 500 MG PO TABS
500.0000 mg | ORAL_TABLET | Freq: Once | ORAL | Status: AC
  Administered 2015-12-20: 500 mg via ORAL
  Filled 2015-12-20: qty 1

## 2015-12-20 NOTE — ED Provider Notes (Signed)
CSN: [id number]     Arrival date & time 12/19/15  2320 History   First MD Initiated Contact with Patient 12/19/15 2348     Chief Complaint  Patient presents with  . Back Pain     (Consider location/radiation/quality/duration/timing/severity/associated sxs/prior Treatment) The history is provided by the patient.   Regina Patton is a 45 y.o. female with a history of chronic back pain, typically with right sided sciatica and also fibromyalgia, presenting with persistent and worsening low back pain since she was seen here 3 days ago.  She was placed on a prednisone taper and hydrocodone, and she states these medicines "are not touching my pain".  She reports midline low back pain with radiation now into her bilateral lower legs to her feet.  She was walking an hour prior to arrival when she collapsed, stating a sharp stab of pain caused her to fall.  She denies fevers, chills,  weakness or numbness in her legs and has had no urinary or bowel incontinence or retention.  Her pain is worsened with movement, although states cannot find a position in which to get comfortable.      Past Medical History  Diagnosis Date  . Hypertension   . Chronic back pain   . Sciatic pain     right  . Rib pain on right side     chronic  . Chronic neck pain   . Cervical radiculopathy   . Chronic knee pain   . Rheumatoid arthritis (HCC)     "Rhematoid factor positive but no symptoms" per EPIC notes 2012  . Fibromyalgia   . C. difficile diarrhea   . Diabetes mellitus without complication (HCC)     diet controlled  . Chronic abdominal pain    Past Surgical History  Procedure Laterality Date  . Tubal ligation    . Bladder surgery    . Cholecystectomy N/A 10/18/2015    Procedure: LAPAROSCOPIC CHOLECYSTECTOMY;  Surgeon: Franky Macho, MD;  Location: [facility];  Service: General;  Laterality: N/A;   Family History  Problem Relation Age of Onset  . Cancer Mother   . Diabetes Mother   . Colon cancer Father     AFTER AGE 50  . Crohn's disease Sister    Social History  Substance Use Topics  . Smoking status: Never Smoker   . Smokeless tobacco: Never Used  . Alcohol Use: No   OB History    Gravida Para Term Preterm AB TAB SAB Ectopic Multiple Living   3 3 3       3      Review of Systems  Constitutional: Negative for fever.  Respiratory: Negative for shortness of breath.   Cardiovascular: Negative for chest pain and leg swelling.  Gastrointestinal: Negative for abdominal pain, constipation and abdominal distention.  Genitourinary: Negative for dysuria, urgency, frequency, flank pain and difficulty urinating.  Musculoskeletal: Positive for back pain. Negative for joint swelling and gait problem.  Skin: Negative for rash and wound.  Neurological: Negative for weakness and numbness.      Allergies  Mango flavor; Penicillins; Tramadol; Zofran; Bactrim; Keflex; and Toradol  Home Medications   Prior to Admission medications   Medication Sig Start Date End Date Taking? Authorizing Provider  acetaminophen (TYLENOL) 500 MG tablet Take 1,000 mg by mouth every 4 (four) hours as needed for moderate pain.    Historical Provider, MD  ciprofloxacin (CIPRO) 500 MG tablet Take 1 tablet (500 mg total) by mouth every 12 (twelve) hours. Patient  not taking: Reported on 12/16/2015 11/26/15   Linwood Dibbles, MD  HYDROcodone-acetaminophen (NORCO/VICODIN) 5-325 MG tablet Take 1 tablet by mouth every 4 (four) hours as needed. 12/16/15   Burgess Amor, PA-C  methocarbamol (ROBAXIN) 500 MG tablet Take 1 tablet (500 mg total) by mouth 4 (four) times daily. 12/20/15 12/30/15  Burgess Amor, PA-C  oxyCODONE-acetaminophen (PERCOCET/ROXICET) 5-325 MG tablet Take 1 tablet by mouth every 4 (four) hours as needed. 12/20/15   Burgess Amor, PA-C  phenazopyridine (PYRIDIUM) 200 MG tablet Take 1 tablet (200 mg total) by mouth 3 (three) times daily. Patient not taking: Reported on 12/16/2015 11/26/15   Linwood Dibbles, MD  predniSONE (DELTASONE) 10  MG tablet 6, 5, 4, 3, 2 then 1 tablet by mouth daily for 6 days total. 12/16/15   Burgess Amor, PA-C   BP 123/74 mmHg  Pulse 57  Temp(Src) 98 F (36.7 C) (Oral)  Ht 5' (1.524 m)  Wt 89.359 kg  BMI 38.47 kg/m2  SpO2 99%  LMP 12/19/2015 Physical Exam  Constitutional: She appears well-developed and well-nourished.  HENT:  Head: Normocephalic.  Eyes: Conjunctivae are normal.  Neck: Normal range of motion. Neck supple.  Cardiovascular: Normal rate and intact distal pulses.   Pedal pulses normal.  Pulmonary/Chest: Effort normal.  Abdominal: Soft. Bowel sounds are normal. She exhibits no distension and no mass.  Musculoskeletal: Normal range of motion. She exhibits no edema.       Lumbar back: She exhibits bony tenderness. She exhibits no swelling, no edema and no spasm.  Midline bony ttp lumbar spine.  Neurological: She is alert. She has normal strength. She displays no atrophy and no tremor. No sensory deficit. Gait normal.  Reflex Scores:      Patellar reflexes are 2+ on the right side and 2+ on the left side.      Achilles reflexes are 2+ on the right side and 2+ on the left side. No strength deficit noted in hip and knee flexor and extensor muscle groups.  Ankle flexion and extension intact.  Skin: Skin is warm and dry.  Psychiatric: She has a normal mood and affect.  Nursing note and vitals reviewed.   ED Course  Procedures (including critical care time) Labs Review Labs Reviewed - No data to display  Imaging Review No results found. I have personally reviewed and evaluated these images and lab results as part of my medical decision-making.   EKG Interpretation None      MDM   Final diagnoses:  Lumbar radiculopathy   No neuro deficit on exam or by history to suggest emergent or surgical presentation.  Also discussed worsened sx that should prompt immediate re-evaluation including distal weakness, bowel/bladder retention/incontinence.  Dilaudid 1 mg IM given.  Plan f/u  with new pcp on Tuesday as planned.   Robaxin added to medication regimen.   Pt reports has contacted Dr Selena Batten since last visit here and will be establishing care with him in 5 days.  Pt ambulated with no increase in pain after dilaudid injection.  No foot drop, normal gait. Added robaxin, advised to finish prednisone taper.  Oxycodone in place of hydrocodone #10. Heat tx.  Burgess Amor, PA-C 12/20/15 0123  Layla Maw Ward, DO 12/20/15 1610

## 2015-12-20 NOTE — Discharge Instructions (Signed)
Finish your prednisone taper.   Do not drive within 4 hours of taking oxycodone as this will make you drowsy. Start taking robaxin.  Avoid lifting,  Bending,  Twisting or any other activity that worsens your pain over the next week.  Apply a heating pad to your lower back 20 minutes 3-4 times daily.   You should get rechecked if your symptoms are not better over the next 5 days,  Or you develop increased pain,  Weakness in your leg(s) or loss of bladder or bowel function - these are symptoms of a worse injury.    Lumbosacral Radiculopathy Lumbosacral radiculopathy is a condition that involves the spinal nerves and nerve roots in the low back and bottom of the spine. The condition develops when these nerves and nerve roots move out of place or become inflamed and cause symptoms. CAUSES This condition may be caused by:  Pressure from a disk that bulges out of place (herniated disk). A disk is a plate of cartilage that separates bones in the spine.  Disk degeneration.  A narrowing of the bones of the lower back (spinal stenosis).  A tumor.  An infection.  An injury that places sudden pressure on the disks that cushion the bones of your lower spine. RISK FACTORS This condition is more likely to develop in:  Males aged 30-50 years.  Females aged 50-60 years.  People who lift improperly.  People who are overweight or live a sedentary lifestyle.  People who smoke.  People who perform repetitive activities that strain the spine. SYMPTOMS Symptoms of this condition include:  Pain that goes down from the back into the legs (sciatica). This is the most common symptom. The pain may be worse with sitting, coughing, or sneezing.  Pain and numbness in the arms and legs.  Muscle weakness.  Tingling.  Loss of bladder control or bowel control. DIAGNOSIS This condition is diagnosed with a physical exam and medical history. If the pain is lasting, you may have tests, such as:  MRI  scan.  X-ray.  CT scan.  Myelogram.  Nerve conduction study. TREATMENT This condition is often treated with:  Hot packs and ice applied to affected areas.  Stretches to improve flexibility.  Exercises to strengthen back muscles.  Physical therapy.  Pain medicine.  A steroid injection in the spine. In some cases, no treatment is needed. If the condition is long-lasting (chronic), or if symptoms are severe, treatment may involve surgery or lifestyle changes, such as following a weight loss plan. HOME CARE INSTRUCTIONS Medicines  Take medicines only as directed by your health care provider.  Do not drive or operate heavy machinery while taking pain medicine. Injury Care  Apply a heat pack to the injured area as directed by your health care provider.  Apply ice to the affected area:  Put ice in a plastic bag.  Place a towel between your skin and the bag.  Leave the ice on for 20-30 minutes, every 2 hours while you are awake or as needed. Or, leave the ice on for as long as directed by your health care provider. Other Instructions  If you were shown how to do any exercises or stretches, do them as directed by your health care provider.  If your health care provider prescribed a diet or exercise program, follow it as directed.  Keep all follow-up visits as directed by your health care provider. This is important. SEEK MEDICAL CARE IF:  Your pain does not improve over time  even when taking pain medicines. SEEK IMMEDIATE MEDICAL CARE IF:  Your develop severe pain.  Your pain suddenly gets worse.  You develop increasing weakness in your legs.  You lose the ability to control your bladder or bowel.  You have difficulty walking or balancing.  You have a fever.   This information is not intended to replace advice given to you by your health care provider. Make sure you discuss any questions you have with your health care provider.   Document Released: 08/24/2005  Document Revised: 01/08/2015 Document Reviewed: 08/20/2014 Elsevier Interactive Patient Education [employer]

## 2016-01-11 ENCOUNTER — Emergency Department (HOSPITAL_COMMUNITY): Payer: Medicare Other

## 2016-01-11 ENCOUNTER — Encounter (HOSPITAL_COMMUNITY): Payer: Self-pay | Admitting: Emergency Medicine

## 2016-01-11 ENCOUNTER — Emergency Department (HOSPITAL_COMMUNITY)
Admission: EM | Admit: 2016-01-11 | Discharge: 2016-01-11 | Disposition: A | Discharge status: Home / Self Care | Payer: Medicare Other | Attending: Emergency Medicine | Admitting: Emergency Medicine

## 2016-01-11 DIAGNOSIS — M545 Low back pain, unspecified: Secondary | ICD-10-CM

## 2016-01-11 DIAGNOSIS — G8929 Other chronic pain: Secondary | ICD-10-CM

## 2016-01-11 DIAGNOSIS — T50905A Adverse effect of unspecified drugs, medicaments and biological substances, initial encounter: Secondary | ICD-10-CM

## 2016-01-11 DIAGNOSIS — Z7984 Long term (current) use of oral hypoglycemic drugs: Secondary | ICD-10-CM | POA: Diagnosis not present

## 2016-01-11 DIAGNOSIS — W109XXA Fall (on) (from) unspecified stairs and steps, initial encounter: Secondary | ICD-10-CM | POA: Diagnosis not present

## 2016-01-11 DIAGNOSIS — R42 Dizziness and giddiness: Secondary | ICD-10-CM | POA: Insufficient documentation

## 2016-01-11 DIAGNOSIS — S8392XA Sprain of unspecified site of left knee, initial encounter: Secondary | ICD-10-CM

## 2016-01-11 DIAGNOSIS — Z79899 Other long term (current) drug therapy: Secondary | ICD-10-CM | POA: Diagnosis not present

## 2016-01-11 DIAGNOSIS — Y999 Unspecified external cause status: Secondary | ICD-10-CM | POA: Diagnosis not present

## 2016-01-11 DIAGNOSIS — I951 Orthostatic hypotension: Secondary | ICD-10-CM

## 2016-01-11 DIAGNOSIS — M549 Dorsalgia, unspecified: Secondary | ICD-10-CM | POA: Insufficient documentation

## 2016-01-11 DIAGNOSIS — Y939 Activity, unspecified: Secondary | ICD-10-CM | POA: Diagnosis not present

## 2016-01-11 DIAGNOSIS — Y92009 Unspecified place in unspecified non-institutional (private) residence as the place of occurrence of the external cause: Secondary | ICD-10-CM | POA: Insufficient documentation

## 2016-01-11 DIAGNOSIS — E119 Type 2 diabetes mellitus without complications: Secondary | ICD-10-CM | POA: Diagnosis not present

## 2016-01-11 DIAGNOSIS — M069 Rheumatoid arthritis, unspecified: Secondary | ICD-10-CM | POA: Diagnosis not present

## 2016-01-11 DIAGNOSIS — S8992XA Unspecified injury of left lower leg, initial encounter: Secondary | ICD-10-CM | POA: Diagnosis present

## 2016-01-11 LAB — URINE MICROSCOPIC-ADD ON

## 2016-01-11 LAB — URINALYSIS, ROUTINE W REFLEX MICROSCOPIC
BILIRUBIN URINE: NEGATIVE
Glucose, UA: NEGATIVE mg/dL
KETONES UR: NEGATIVE mg/dL
NITRITE: NEGATIVE
PH: 5.5 (ref 5.0–8.0)
Protein, ur: NEGATIVE mg/dL

## 2016-01-11 LAB — I-STAT CHEM 8, ED
BUN: 3 mg/dL — AB (ref 6–20)
CHLORIDE: 105 mmol/L (ref 101–111)
Calcium, Ion: 1.16 mmol/L (ref 1.12–1.23)
Creatinine, Ser: 0.7 mg/dL (ref 0.44–1.00)
Glucose, Bld: 95 mg/dL (ref 65–99)
HEMATOCRIT: 39 % (ref 36.0–46.0)
Hemoglobin: 13.3 g/dL (ref 12.0–15.0)
POTASSIUM: 3.4 mmol/L — AB (ref 3.5–5.1)
SODIUM: 143 mmol/L (ref 135–145)
TCO2: 24 mmol/L (ref 0–100)

## 2016-01-11 LAB — PREGNANCY, URINE: Preg Test, Ur: NEGATIVE

## 2016-01-11 MED ORDER — HYDROCODONE-ACETAMINOPHEN 5-325 MG PO TABS: 1.0000 | ORAL_TABLET | ORAL | Status: DC | PRN

## 2016-01-11 MED ORDER — HYDROCODONE-ACETAMINOPHEN 5-325 MG PO TABS
1.0000 | ORAL_TABLET | Freq: Once | ORAL | Status: AC
  Administered 2016-01-11: 1 via ORAL
  Filled 2016-01-11: qty 1

## 2016-01-11 MED ORDER — METHOCARBAMOL 500 MG PO TABS
1000.0000 mg | ORAL_TABLET | Freq: Once | ORAL | Status: DC
  Filled 2016-01-11: qty 2

## 2016-01-11 NOTE — Discharge Instructions (Signed)
Follow-up with orthopedist for persistent knee pain. Make sure you're drinking plenty of fluids. Do not make sudden position changes. Call and make appointment to follow-up with your physician   Back Pain, Adult Back pain is very common in adults.The cause of back pain is rarely dangerous and the pain often gets better over time.The cause of your back pain may not be known. Some common causes of back pain include:  Strain of the muscles or ligaments supporting the spine.  Wear and tear (degeneration) of the spinal disks.  Arthritis.  Direct injury to the back. For many people, back pain may return. Since back pain is rarely dangerous, most people can learn to manage this condition on their own. HOME CARE INSTRUCTIONS Watch your back pain for any changes. The following actions may help to lessen any discomfort you are feeling:  Remain active. It is stressful on your back to sit or stand in one place for long periods of time. Do not sit, drive, or stand in one place for more than 30 minutes at a time. Take short walks on even surfaces as soon as you are able.Try to increase the length of time you walk each day.  Exercise regularly as directed by your health care provider. Exercise helps your back heal faster. It also helps avoid future injury by keeping your muscles strong and flexible.  Do not stay in bed.Resting more than 1-2 days can delay your recovery.  Pay attention to your body when you bend and lift. The most comfortable positions are those that put less stress on your recovering back. Always use proper lifting techniques, including:  Bending your knees.  Keeping the load close to your body.  Avoiding twisting.  Find a comfortable position to sleep. Use a firm mattress and lie on your side with your knees slightly bent. If you lie on your back, put a pillow under your knees.  Avoid feeling anxious or stressed.Stress increases muscle tension and can worsen back pain.It is  important to recognize when you are anxious or stressed and learn ways to manage it, such as with exercise.  Take medicines only as directed by your health care provider. Over-the-counter medicines to reduce pain and inflammation are often the most helpful.Your health care provider may prescribe muscle relaxant drugs.These medicines help dull your pain so you can more quickly return to your normal activities and healthy exercise.  Apply ice to the injured area:  Put ice in a plastic bag.  Place a towel between your skin and the bag.  Leave the ice on for 20 minutes, 2-3 times a day for the first 2-3 days. After that, ice and heat may be alternated to reduce pain and spasms.  Maintain a healthy weight. Excess weight puts extra stress on your back and makes it difficult to maintain good posture. SEEK MEDICAL CARE IF:  You have pain that is not relieved with rest or medicine.  You have increasing pain going down into the legs or buttocks.  You have pain that does not improve in one week.  You have night pain.  You lose weight.  You have a fever or chills. SEEK IMMEDIATE MEDICAL CARE IF:   You develop new bowel or bladder control problems.  You have unusual weakness or numbness in your arms or legs.  You develop nausea or vomiting.  You develop abdominal pain.  You feel faint.   This information is not intended to replace advice given to you by your health care  provider. Make sure you discuss any questions you have with your health care provider.   Document Released: 08/24/2005 Document Revised: 09/14/2014 Document Reviewed: 12/26/2013 Elsevier Interactive Patient Education 2016 Else[employer] Inc[employer]  Knee Sprain A knee sprain is a tear in the strong bands of tissue that connect the bones (ligaments) of your knee. HOME CARE  Raise (elevate) your injured knee to lessen puffiness (swelling).  To ease pain and puffiness, put ice on the injured area.  Put ice in a plastic  bag.  Place a towel between your skin and the bag.  Leave the ice on for 20 minutes, 2-3 times a day.  Only take medicine as told by your doctor.  Do not leave your knee unprotected until pain and stiffness go away (usually 4-6 weeks).  If you have a cast or splint, do not get it wet. If your doctor told you to not take it off, cover it with a plastic bag when you shower or bathe. Do not swim.  Your doctor may have you do exercises to prevent or limit permanent weakness and stiffness. GET HELP RIGHT AWAY IF:   Your cast or splint becomes damaged.  Your pain gets worse.  You have a lot of pain, puffiness, or numbness below the cast or splint. MAKE SURE YOU:   Understand these instructions.  Will watch your condition.  Will get help right away if you are not doing well or get worse.   This information is not intended to replace advice given to you by your health care provider. Make sure you discuss any questions you have with your health care provider.   Document Released: 08/12/2009 Document Revised: 08/29/2013 Document Reviewed: 05/02/2013 Elsevier Interactive Patient Education 2016 Else[employer] Inc[employer] Orthostatic Hypotension Orthostatic hypotension is a sudden drop in blood pressure. It happens when you quickly stand up from a seated or lying position. You may feel dizzy or light-headed. This can last for just a few seconds or for up to a few minutes. It is usually not a serious problem. However, if this happens frequently or gets worse, it can be a sign of something more serious. CAUSES  Different things can cause orthostatic hypotension, including:  Loss of body fluids (dehydration). Medicines that lower blood pressure. Sudden changes in posture, such as standing up quickly after you have been sitting or lying down. Taking too much of your medicine. SIGNS AND SYMPTOMS  Light-headedness or dizziness.  Fainting or near-fainting.  A fast heart rate.  Weakness.  Feeling  tired (fatigue).  DIAGNOSIS  Your health care provider may do several things to help diagnose your condition and identify the cause. These may include:  Taking a medical history and doing a physical exam. Checking your blood pressure. Your health care provider will check your blood pressure when you are: Lying down. Sitting. Standing. Using tilt table testing. In this test, you lie down on a table that moves from a lying position to a standing position. You will be strapped onto the table. This test monitors your blood pressure and heart rate when you are in different positions. TREATMENT  Treatment will vary depending on the cause. Possible treatments include:  Changing the dosage of your medicines. Wearing compression stockings on your lower legs. Standing up slowly after sitting or lying down. Eating more salt. Eating frequent, small meals. In some cases, getting IV fluids. Taking medicine to enhance fluid retention. HOME CARE INSTRUCTIONS Only take over-the-counter or prescription medicines as directed by your health care provider. Follow  your health care provider's instructions for changing the dosage of your current medicines. Do not stop or adjust your medicine on your own. Stand up slowly after sitting or lying down. This allows your body to adjust to the different position. Wear compression stockings as directed. Eat extra salt as directed. Do not add extra salt to your diet unless directed to by your health care provider. Eat frequent, small meals. Avoid standing suddenly after eating. Avoid hot showers or excessive heat as directed by your health care provider. Keep all follow-up appointments. SEEK MEDICAL CARE IF: You continue to feel dizzy or light-headed after standing. You feel groggy or confused. You feel cold, clammy, or sick to your stomach (nauseous). You have blurred vision. You feel short of breath. SEEK IMMEDIATE MEDICAL CARE IF:  You faint after  standing. You have chest pain. You have difficulty breathing.  You lose feeling or movement in your arms or legs.  You have slurred speech or difficulty talking, or you are unable to talk.  MAKE SURE YOU:  Understand these instructions. Will watch your condition. Will get help right away if you are not doing well or get worse.   This information is not intended to replace advice given to you by your health care provider. Make sure you discuss any questions you have with your health care provider.   Document Released: 08/14/2002 Document Revised: 08/29/2013 Document Reviewed: 06/16/2013 Elsevier Interactive Patient Education [employer]

## 2016-01-11 NOTE — ED Notes (Addendum)
Pt states that robaxin does not work for her. Pt requesting baclofen and something else for pain as well. MD notified. No new orders given.

## 2016-01-11 NOTE — ED Notes (Signed)
Pt states she got dizzy and fell down the steps at home. C/O back and knee pain. Pt did not hit her head.

## 2016-01-11 NOTE — ED Notes (Signed)
Ice pack applied to left knee

## 2016-01-11 NOTE — ED Provider Notes (Signed)
4:05 PM patient alert ambulates without difficulty and without limp not lightheaded on standing. Results for orders placed or performed during the hospital encounter of 01/11/16  Urinalysis, Routine w reflex microscopic (not at [facility])  Result Value Ref Range   Color, Urine YELLOW YELLOW   APPearance CLOUDY (A) CLEAR   Specific Gravity, Urine >1.030 (H) 1.005 - 1.030   pH 5.5 5.0 - 8.0   Glucose, UA NEGATIVE NEGATIVE mg/dL   Hgb urine dipstick TRACE (A) NEGATIVE   Bilirubin Urine NEGATIVE NEGATIVE   Ketones, ur NEGATIVE NEGATIVE mg/dL   Protein, ur NEGATIVE NEGATIVE mg/dL   Nitrite NEGATIVE NEGATIVE   Leukocytes, UA TRACE (A) NEGATIVE  Pregnancy, urine  Result Value Ref Range   Preg Test, Ur NEGATIVE NEGATIVE  Urine microscopic-add on  Result Value Ref Range   Squamous Epithelial / LPF 6-30 (A) NONE SEEN   WBC, UA 6-30 0 - 5 WBC/hpf   RBC / HPF 0-5 0 - 5 RBC/hpf   Bacteria, UA MANY (A) NONE SEEN  I-stat chem 8, ed  Result Value Ref Range   Sodium 143 135 - 145 mmol/L   Potassium 3.4 (L) 3.5 - 5.1 mmol/L   Chloride 105 101 - 111 mmol/L   BUN 3 (L) 6 - 20 mg/dL   Creatinine, Ser 0.27 0.44 - 1.00 mg/dL   Glucose, Bld 95 65 - 99 mg/dL   Calcium, Ion 7.41 2.87 - 1.23 mmol/L   TCO2 24 0 - 100 mmol/L   Hemoglobin 13.3 12.0 - 15.0 g/dL   HCT 86.7 67.2 - 09.4 %   Dg Lumbar Spine Complete  01/11/2016  CLINICAL DATA:  45 year old female with acute low back and lumbar spine pain following fall today. Initial encounter. EXAM: LUMBAR SPINE - COMPLETE 4+ VIEW COMPARISON:  12/16/2015 FINDINGS: There is no evidence of acute fracture or subluxation. Minimal disc space narrowing within the upper lumbar spine noted. No focal bony lesion or spondylolysis noted. IMPRESSION: No evidence of acute abnormality. Electronically Signed   By: Harmon Pier M.D.   On: 01/11/2016 15:54   Dg Lumbar Spine Complete  12/16/2015  CLINICAL DATA:  Low back pain for 3 days radiating down both legs for 1 day. No known  injury. EXAM: LUMBAR SPINE - COMPLETE 4+ VIEW COMPARISON:  CT 10/12/2015 FINDINGS: Slight disc space narrowing at L5-S1. Alignment is normal. No fracture. SI joints are symmetric and unremarkable. IMPRESSION: No acute bony abnormality. Electronically Signed   By: Charlett Nose M.D.   On: 12/16/2015 09:58   Dg Knee Complete 4 Views Left  01/11/2016  CLINICAL DATA:  Acute left knee pain following fall and injury today. Initial encounter. EXAM: LEFT KNEE - COMPLETE 4+ VIEW COMPARISON:  None. FINDINGS: There is no evidence of fracture, dislocation, or joint effusion. There is no evidence of arthropathy or other focal bone abnormality. Soft tissues are unremarkable. IMPRESSION: Negative. Electronically Signed   By: Harmon Pier M.D.   On: 01/11/2016 15:52     Doug Sou, MD 01/11/16 1609

## 2016-01-11 NOTE — ED Provider Notes (Signed)
CSN: [id number]     Arrival date & time 01/11/16  1335 History   First MD Initiated Contact with Patient 01/11/16 1347     Chief Complaint  Patient presents with  . Fall     (Consider location/radiation/quality/duration/timing/severity/associated sxs/prior Treatment) HPI Patient with history of chronic back pain states that she fell down multiple steps at her house just prior to arrival. States she was coming down the stairs and got dizzy. She lost her balance and fell down several stairs. States she twisted her left knee. Denies hitting her head or loss of consciousness. He has had worsening of her low back pain. No radiation. No weakness or numbness. No incontinence. No chest pain or shortness of breath at any point. No visual changes. Past Medical History  Diagnosis Date  . Hypertension   . Chronic back pain   . Sciatic pain     right  . Rib pain on right side     chronic  . Chronic neck pain   . Cervical radiculopathy   . Chronic knee pain   . Rheumatoid arthritis (HCC)     "Rhematoid factor positive but no symptoms" per EPIC notes 2012  . Fibromyalgia   . C. difficile diarrhea   . Diabetes mellitus without complication (HCC)     diet controlled  . Chronic abdominal pain    Past Surgical History  Procedure Laterality Date  . Tubal ligation    . Bladder surgery    . Cholecystectomy N/A 10/18/2015    Procedure: LAPAROSCOPIC CHOLECYSTECTOMY;  Surgeon: Franky Macho, MD;  Location: [facility];  Service: General;  Laterality: N/A;   Family History  Problem Relation Age of Onset  . Cancer Mother   . Diabetes Mother   . Colon cancer Father     AFTER AGE 28  . Crohn's disease Sister    Social History  Substance Use Topics  . Smoking status: Never Smoker   . Smokeless tobacco: Never Used  . Alcohol Use: No   OB History    Gravida Para Term Preterm AB TAB SAB Ectopic Multiple Living   3 3 3       3      Review of Systems  Constitutional: Negative for fever and chills.   Eyes: Negative for visual disturbance.  Respiratory: Negative for cough and shortness of breath.   Cardiovascular: Negative for chest pain.  Gastrointestinal: Negative for nausea, vomiting and abdominal pain.  Genitourinary: Negative for dysuria, frequency, hematuria and flank pain.  Musculoskeletal: Positive for arthralgias. Negative for myalgias, back pain and neck pain.  Skin: Negative for rash and wound.  Neurological: Positive for dizziness and light-headedness. Negative for syncope, weakness, numbness and headaches.  All other systems reviewed and are negative.     Allergies  Mango flavor; Penicillins; Tramadol; Zofran; Bactrim; Keflex; and Toradol  Home Medications   Prior to Admission medications   Medication Sig Start Date End Date Taking? Authorizing Provider  acetaminophen (TYLENOL) 500 MG tablet Take 1,000 mg by mouth every 4 (four) hours as needed for moderate pain.   Yes Historical Provider, MD  baclofen (LIORESAL) 10 MG tablet Take 5-15 mg by mouth See admin instructions. (1/2) tablet x 3 days, 1 tablet x 3 days, 1.5 tablets for the rest of the month. Starting 01/09/2016. 01/09/16  Yes Historical Provider, MD  escitalopram (LEXAPRO) 10 MG tablet Take 1 tablet by mouth daily. 12/24/15  Yes Historical Provider, MD  zolpidem (AMBIEN) 10 MG tablet Take 1 tablet by mouth  at bedtime as needed for sleep.  01/09/16  Yes Historical Provider, MD  ciprofloxacin (CIPRO) 500 MG tablet Take 1 tablet (500 mg total) by mouth every 12 (twelve) hours. Patient not taking: Reported on 12/16/2015 11/26/15   Linwood Dibbles, MD  HYDROcodone-acetaminophen [facility]) 5-325 MG tablet Take 1-2 tablets by mouth every 4 (four) hours as needed for severe pain. 01/11/16   Loren Racer, MD  oxyCODONE-acetaminophen (PERCOCET/ROXICET) 5-325 MG tablet Take 1 tablet by mouth every 4 (four) hours as needed. Patient not taking: Reported on 01/11/2016 12/20/15   Burgess Amor, PA-C  phenazopyridine (PYRIDIUM) 200 MG tablet Take 1  tablet (200 mg total) by mouth 3 (three) times daily. Patient not taking: Reported on 12/16/2015 11/26/15   Linwood Dibbles, MD  predniSONE (DELTASONE) 10 MG tablet 6, 5, 4, 3, 2 then 1 tablet by mouth daily for 6 days total. Patient not taking: Reported on 01/11/2016 12/16/15   Burgess Amor, PA-C   BP 138/84 mmHg  Pulse 65  Temp(Src) 98.2 F (36.8 C) (Oral)  Resp 18  Ht 5' (1.524 m)  Wt 197 lb (89.359 kg)  BMI 38.47 kg/m2  SpO2 100%  LMP 12/19/2015 Physical Exam  Constitutional: She is oriented to person, place, and time. She appears well-developed and well-nourished. No distress.  HENT:  Head: Normocephalic and atraumatic.  Mouth/Throat: Oropharynx is clear and moist. No oropharyngeal exudate.  No obvious head or neck trauma. Midface is stable. No malocclusion.  Eyes: EOM are normal. Pupils are equal, round, and reactive to light.  Neck: Normal range of motion. Neck supple.  No posterior midline cervical tenderness to palpation.  Cardiovascular: Normal rate and regular rhythm.  Exam reveals no gallop and no friction rub.   No murmur heard. Pulmonary/Chest: Effort normal and breath sounds normal. No respiratory distress. She has no wheezes. She has no rales. She exhibits no tenderness.  Abdominal: Soft. Bowel sounds are normal. She exhibits no distension and no mass. There is no tenderness. There is no rebound and no guarding.  Musculoskeletal: Normal range of motion. She exhibits tenderness (mild midline lumbar tenderness diffusely.). She exhibits no edema.  Patient complains of diffuse left knee pain. No ligamentous instability. No evidence of any trauma. There is no effusion present. Full range of motion. Distal pulses are 2+.  Neurological: She is alert and oriented to person, place, and time.  5/5 motor in all extremities. Sensation fully intact.  Skin: Skin is warm and dry. No rash noted. No erythema.  Psychiatric: She has a normal mood and affect. Her behavior is normal.  Nursing note  and vitals reviewed.   ED Course  Procedures (including critical care time) Labs Review Labs Reviewed  URINALYSIS, ROUTINE W REFLEX MICROSCOPIC (NOT AT [facility]) - Abnormal; Notable for the following:    APPearance CLOUDY (*)    Specific Gravity, Urine >1.030 (*)    Hgb urine dipstick TRACE (*)    Leukocytes, UA TRACE (*)    All other components within normal limits  URINE MICROSCOPIC-ADD ON - Abnormal; Notable for the following:    Squamous Epithelial / LPF 6-30 (*)    Bacteria, UA MANY (*)    All other components within normal limits  I-STAT CHEM 8, ED - Abnormal; Notable for the following:    Potassium 3.4 (*)    BUN 3 (*)    All other components within normal limits  PREGNANCY, URINE    Imaging Review No results found. I have personally reviewed and evaluated these images and  lab results as part of my medical decision-making.   EKG Interpretation   Date/Time:  Saturday Jan 11 2016 14:34:39 EDT Ventricular Rate:  56 PR Interval:  143 QRS Duration: 101 QT Interval:  436 QTC Calculation: 421 R Axis:   16 Text Interpretation:  Sinus rhythm Low voltage, precordial leads  Borderline T abnormalities, anterior leads Baseline wander in lead(s) V2  V5 Confirmed by Vandell Kun  MD, Jezlyn Westerfield ([id number]) on 01/11/2016 2:51:33 PM      MDM   Final diagnoses:  Orthostasis  Medication side effect, initial encounter  Knee sprain, left, initial encounter  Acute exacerbation of chronic low back pain    Patient has no evidence of any trauma. Exam appears inconsistent with history. We'll do screening labs and x-rays of the left knee and lumbar spine. Anticipate discharge home.  Patient recently started on baclofen. Noted to be mildly bradycardic with some orthostatic changes. Think this may be side effect of the med.   Loren Racer, MD 01/15/16 [phone]

## 2016-01-22 ENCOUNTER — Other Ambulatory Visit (HOSPITAL_COMMUNITY): Payer: Self-pay | Admitting: Internal Medicine

## 2016-01-22 DIAGNOSIS — N6310 Unspecified lump in the right breast, unspecified quadrant: Secondary | ICD-10-CM

## 2016-01-28 ENCOUNTER — Ambulatory Visit (HOSPITAL_COMMUNITY)
Admission: RE | Admit: 2016-01-28 | Discharge: 2016-01-28 | Disposition: A | Discharge status: Home / Self Care | Payer: Medicare Other | Source: Ambulatory Visit | Attending: Internal Medicine | Admitting: Internal Medicine

## 2016-01-28 DIAGNOSIS — N63 Unspecified lump in breast: Secondary | ICD-10-CM | POA: Insufficient documentation

## 2016-01-28 DIAGNOSIS — N6310 Unspecified lump in the right breast, unspecified quadrant: Secondary | ICD-10-CM

## 2016-02-22 ENCOUNTER — Emergency Department (HOSPITAL_COMMUNITY)
Admission: EM | Admit: 2016-02-22 | Discharge: 2016-02-22 | Disposition: A | Discharge status: Home / Self Care | Payer: Medicare Other | Attending: Emergency Medicine | Admitting: Emergency Medicine

## 2016-02-22 ENCOUNTER — Emergency Department (HOSPITAL_COMMUNITY): Payer: Medicare Other

## 2016-02-22 ENCOUNTER — Encounter (HOSPITAL_COMMUNITY): Payer: Self-pay | Admitting: Emergency Medicine

## 2016-02-22 DIAGNOSIS — Z79899 Other long term (current) drug therapy: Secondary | ICD-10-CM | POA: Insufficient documentation

## 2016-02-22 DIAGNOSIS — E119 Type 2 diabetes mellitus without complications: Secondary | ICD-10-CM | POA: Insufficient documentation

## 2016-02-22 DIAGNOSIS — R109 Unspecified abdominal pain: Secondary | ICD-10-CM | POA: Diagnosis present

## 2016-02-22 DIAGNOSIS — I10 Essential (primary) hypertension: Secondary | ICD-10-CM | POA: Diagnosis not present

## 2016-02-22 DIAGNOSIS — N390 Urinary tract infection, site not specified: Secondary | ICD-10-CM | POA: Diagnosis not present

## 2016-02-22 LAB — COMPREHENSIVE METABOLIC PANEL
ALT: 12 U/L — ABNORMAL LOW (ref 14–54)
ANION GAP: 7 (ref 5–15)
AST: 13 U/L — ABNORMAL LOW (ref 15–41)
Albumin: 4 g/dL (ref 3.5–5.0)
Alkaline Phosphatase: 33 U/L — ABNORMAL LOW (ref 38–126)
BUN: 9 mg/dL (ref 6–20)
CALCIUM: 9.1 mg/dL (ref 8.9–10.3)
CHLORIDE: 109 mmol/L (ref 101–111)
CO2: 24 mmol/L (ref 22–32)
Creatinine, Ser: 0.71 mg/dL (ref 0.44–1.00)
GFR calc non Af Amer: >60 mL/min (ref >60)
Glucose, Bld: 97 mg/dL (ref 65–99)
Potassium: 3.5 mmol/L (ref 3.5–5.1)
SODIUM: 140 mmol/L (ref 135–145)
Total Bilirubin: 0.3 mg/dL (ref 0.3–1.2)
Total Protein: 7.5 g/dL (ref 6.5–8.1)

## 2016-02-22 LAB — URINALYSIS, ROUTINE W REFLEX MICROSCOPIC
Bilirubin Urine: NEGATIVE
Glucose, UA: NEGATIVE mg/dL
KETONES UR: NEGATIVE mg/dL
NITRITE: NEGATIVE
PH: 5.5 (ref 5.0–8.0)
Protein, ur: NEGATIVE mg/dL

## 2016-02-22 LAB — CBC WITH DIFFERENTIAL/PLATELET
Basophils Absolute: 0 10*3/uL (ref 0.0–0.1)
Basophils Relative: 0 %
EOS ABS: 0.1 10*3/uL (ref 0.0–0.7)
EOS PCT: 2 %
HCT: 39.4 % (ref 36.0–46.0)
Hemoglobin: 12.7 g/dL (ref 12.0–15.0)
LYMPHS ABS: 2.6 10*3/uL (ref 0.7–4.0)
Lymphocytes Relative: 32 %
MCH: 26.7 pg (ref 26.0–34.0)
MCHC: 32.2 g/dL (ref 30.0–36.0)
MCV: 82.8 fL (ref 78.0–100.0)
MONOS PCT: 5 %
Monocytes Absolute: 0.4 10*3/uL (ref 0.1–1.0)
Neutro Abs: 4.9 10*3/uL (ref 1.7–7.7)
Neutrophils Relative %: 61 %
PLATELETS: 307 10*3/uL (ref 150–400)
RBC: 4.76 MIL/uL (ref 3.87–5.11)
RDW: 15.1 % (ref 11.5–15.5)
WBC: 8.1 10*3/uL (ref 4.0–10.5)

## 2016-02-22 LAB — URINE MICROSCOPIC-ADD ON

## 2016-02-22 MED ORDER — PROCHLORPERAZINE EDISYLATE 5 MG/ML IJ SOLN
10.0000 mg | Freq: Once | INTRAMUSCULAR | Status: AC
  Administered 2016-02-22: 10 mg via INTRAVENOUS

## 2016-02-22 MED ORDER — SODIUM CHLORIDE 0.9 % IV SOLN
INTRAVENOUS | Status: DC
  Administered 2016-02-22: 17:00:00 via INTRAVENOUS

## 2016-02-22 MED ORDER — ONDANSETRON HCL 4 MG/2ML IJ SOLN
4.0000 mg | Freq: Once | INTRAMUSCULAR | Status: DC
  Filled 2016-02-22: qty 2

## 2016-02-22 MED ORDER — HYDROCODONE-ACETAMINOPHEN 5-325 MG PO TABS: 1.0000 | ORAL_TABLET | Freq: Four times a day (QID) | ORAL | Status: DC | PRN

## 2016-02-22 MED ORDER — CEPHALEXIN 500 MG PO CAPS: 500.0000 mg | ORAL_CAPSULE | Freq: Four times a day (QID) | ORAL | Status: DC

## 2016-02-22 MED ORDER — PROCHLORPERAZINE EDISYLATE 5 MG/ML IJ SOLN
INTRAMUSCULAR | Status: AC
  Administered 2016-02-22: 10 mg via INTRAVENOUS
  Filled 2016-02-22: qty 2

## 2016-02-22 MED ORDER — HYDROMORPHONE HCL 1 MG/ML IJ SOLN
1.0000 mg | Freq: Once | INTRAMUSCULAR | Status: AC
  Administered 2016-02-22: 1 mg via INTRAVENOUS
  Filled 2016-02-22: qty 1

## 2016-02-22 NOTE — Discharge Instructions (Signed)
Flank Pain °Flank pain refers to pain that is located on the side of the body between the upper abdomen and the back. The pain may occur over a short period of time (acute) or may be long-term or reoccurring (chronic). It may be mild or severe. Flank pain can be caused by many things. °CAUSES  °Some of the more common causes of flank pain include: °· Muscle strains.   °· Muscle spasms.   °· A disease of your spine (vertebral disk disease).   °· A lung infection (pneumonia).   °· Fluid around your lungs (pulmonary edema).   °· A kidney infection.   °· Kidney stones.   °· A very painful skin rash caused by the chickenpox virus (shingles).   °· Gallbladder disease.   °HOME CARE INSTRUCTIONS  °Home care will depend on the cause of your pain. In general, °· Rest as directed by your caregiver. °· Drink enough fluids to keep your urine clear or pale yellow. °· Only take over-the-counter or prescription medicines as directed by your caregiver. Some medicines may help relieve the pain. °· Tell your caregiver about any changes in your pain. °· Follow up with your caregiver as directed. °SEEK IMMEDIATE MEDICAL CARE IF:  °· Your pain is not controlled with medicine.   °· You have new or worsening symptoms. °· Your pain increases.   °· You have abdominal pain.   °· You have shortness of breath.   °· You have persistent nausea or vomiting.   °· You have swelling in your abdomen.   °· You feel faint or pass out.   °· You have blood in your urine. °· You have a fever or persistent symptoms for more than 2-3 days. °· You have a fever and your symptoms suddenly get worse. °MAKE SURE YOU:  °· Understand these instructions. °· Will watch your condition. °· Will get help right away if you are not doing well or get worse. °  °This information is not intended to replace advice given to you by your health care provider. Make sure you discuss any questions you have with your health care provider. °  °Document Released: 10/15/2005 Document  Revised: 05/18/2012 Document Reviewed: 04/07/2012 °Elsevier Interactive Patient Education ©2016 [employer] Inc[employer] ° °Urinary Tract Infection °A urinary tract infection (UTI) can occur any place along the urinary tract. The tract includes the kidneys, ureters, bladder, and urethra. A type of germ called bacteria often causes a UTI. UTIs are often helped with antibiotic medicine.  °HOME CARE  °· If given, take antibiotics as told by your doctor. Finish them even if you start to feel better. °· Drink enough fluids to keep your pee (urine) clear or pale yellow. °· Avoid tea, drinks with caffeine, and bubbly (carbonated) drinks. °· Pee often. Avoid holding your pee in for a long time. °· Pee before and after having sex (intercourse). °· Wipe from front to back after you poop (bowel movement) if you are a woman. Use each tissue only once. °GET HELP RIGHT AWAY IF:  °· You have back pain. °· You have lower belly (abdominal) pain. °· You have chills. °· You feel sick to your stomach (nauseous). °· You throw up (vomit). °· Your burning or discomfort with peeing does not go away. °· You have a fever. °· Your symptoms are not better in 3 days. °MAKE SURE YOU:  °· Understand these instructions. °· Will watch your condition. °· Will get help right away if you are not doing well or get worse. °  °This information is not intended to replace advice given to   you by your health care provider. Make sure you discuss any questions you have with your health care provider. °  °Document Released: 02/10/2008 Document Revised: 09/14/2014 Document Reviewed: 03/24/2012 °Elsevier Interactive Patient Education ©2016 [employer] ° °

## 2016-02-22 NOTE — ED Notes (Signed)
Patient c/o right flank pain with nausea, vomiting, and diarrhea. Per patient started this morning. Denies any fevers or urinary symptoms. Denies hx of kidney stones.

## 2016-02-22 NOTE — ED Provider Notes (Signed)
CSN: [id number]     Arrival date & time 02/22/16  1512 History   First MD Initiated Contact with Patient 02/22/16 1621     Chief Complaint  Patient presents with  . Flank Pain     (Consider location/radiation/quality/duration/timing/severity/associated sxs/prior Treatment) HPI    Regina Patton is a 45 y.o. female who presents for evaluation of right flank pain associated with nausea, vomiting, diarrhea, present since early this morning. No blood in emesis or diarrhea. He denies fever, cough, chest pain, weakness or dizziness. She's had similar problems in the past when she had a urinary tract infection. She is status post cholecystectomy. She denies dysuria, urinary frequency, or hematuria. No history of kidney stones. No preceding constipation. There are no other no modifying factors.   Past Medical History  Diagnosis Date  . Hypertension   . Chronic back pain   . Sciatic pain     right  . Rib pain on right side     chronic  . Chronic neck pain   . Cervical radiculopathy   . Chronic knee pain   . Rheumatoid arthritis (HCC)     "Rhematoid factor positive but no symptoms" per EPIC notes 2012  . Fibromyalgia   . C. difficile diarrhea   . Diabetes mellitus without complication (HCC)     diet controlled  . Chronic abdominal pain    Past Surgical History  Procedure Laterality Date  . Tubal ligation    . Bladder surgery    . Cholecystectomy N/A 10/18/2015    Procedure: LAPAROSCOPIC CHOLECYSTECTOMY;  Surgeon: Franky Macho, MD;  Location: [facility];  Service: General;  Laterality: N/A;   Family History  Problem Relation Age of Onset  . Cancer Mother   . Diabetes Mother   . Colon cancer Father     AFTER AGE 67  . Crohn's disease Sister    Social History  Substance Use Topics  . Smoking status: Never Smoker   . Smokeless tobacco: Never Used  . Alcohol Use: No   OB History    Gravida Para Term Preterm AB TAB SAB Ectopic Multiple Living   3 3 3       3      Review of  Systems  All other systems reviewed and are negative.     Allergies  Mango flavor; Penicillins; Tramadol; Zofran; Bactrim; Keflex; and Toradol  Home Medications   Prior to Admission medications   Medication Sig Start Date End Date Taking? Authorizing Provider  acetaminophen (TYLENOL) 500 MG tablet Take 1,000 mg by mouth every 4 (four) hours as needed for moderate pain.   Yes Historical Provider, MD  lisinopril-hydrochlorothiazide (PRINZIDE,ZESTORETIC) 20-25 MG tablet Take 1 tablet by mouth daily.   Yes Historical Provider, MD  cephALEXin (KEFLEX) 500 MG capsule Take 1 capsule (500 mg total) by mouth 4 (four) times daily. 02/22/16   02/24/16, MD  HYDROcodone-acetaminophen (NORCO) 5-325 MG tablet Take 1 tablet by mouth every 6 (six) hours as needed for moderate pain. 02/22/16   02/24/16, MD   BP 122/72 mmHg  Pulse 72  Temp(Src) 98.4 F (36.9 C) (Oral)  Resp 18  Ht 5' (1.524 m)  Wt 182 lb (82.555 kg)  BMI 35.54 kg/m2  SpO2 100%  LMP 01/30/2016 Physical Exam  Constitutional: She is oriented to person, place, and time. She appears well-developed and well-nourished. She appears distressed (She is uncomfortable).  HENT:  Head: Normocephalic and atraumatic.  Right Ear: External ear normal.  Left Ear: External  ear normal.  Eyes: Conjunctivae and EOM are normal. Pupils are equal, round, and reactive to light.  Neck: Normal range of motion and phonation normal. Neck supple.  Cardiovascular: Normal rate, regular rhythm and normal heart sounds.   Pulmonary/Chest: Effort normal and breath sounds normal. She exhibits no bony tenderness.  Abdominal: Soft. She exhibits no mass. There is no tenderness (Mild right upper quadrant tenderness.). There is no rebound and no guarding.  Musculoskeletal: Normal range of motion.  Neurological: She is alert and oriented to person, place, and time. No cranial nerve deficit or sensory deficit. She exhibits normal muscle tone. Coordination normal.   Skin: Skin is warm, dry and intact.  Psychiatric: She has a normal mood and affect. Her behavior is normal. Judgment and thought content normal.  Nursing note and vitals reviewed.   ED Course  Procedures (including critical care time) Medications  ondansetron (ZOFRAN) injection 4 mg (4 mg Intravenous Not Given 02/22/16 1705)  0.9 %  sodium chloride infusion ( Intravenous Stopped 02/22/16 1947)  HYDROmorphone (DILAUDID) injection 1 mg (1 mg Intravenous Given 02/22/16 1704)  prochlorperazine (COMPAZINE) injection 10 mg (10 mg Intravenous Given 02/22/16 1806)    Patient Vitals for the past 24 hrs:  BP Temp Temp src Pulse Resp SpO2 Height Weight  02/22/16 1805 122/72 mmHg - - 72 18 100 % - -  02/22/16 1537 145/94 mmHg 98.4 F (36.9 C) Oral 61 18 100 % 5' (1.524 m) 182 lb (82.555 kg)    At discharge-  Reevaluation with update and discussion. After initial assessment and treatment, an updated evaluation reveals she is more comfortable at this time. Findings discussed with the patient and all questions answered. Shyler Holzman L     Labs Review Labs Reviewed  COMPREHENSIVE METABOLIC PANEL - Abnormal; Notable for the following:    AST 13 (*)    ALT 12 (*)    Alkaline Phosphatase 33 (*)    All other components within normal limits  URINALYSIS, ROUTINE W REFLEX MICROSCOPIC (NOT AT [facility]) - Abnormal; Notable for the following:    APPearance HAZY (*)    Specific Gravity, Urine >1.030 (*)    Hgb urine dipstick TRACE (*)    Leukocytes, UA SMALL (*)    All other components within normal limits  URINE MICROSCOPIC-ADD ON - Abnormal; Notable for the following:    Squamous Epithelial / LPF 6-30 (*)    Bacteria, UA MANY (*)    Casts HYALINE CASTS (*)    All other components within normal limits  URINE CULTURE  CBC WITH DIFFERENTIAL/PLATELET    Imaging Review Ct Renal Stone Study  02/22/2016  CLINICAL DATA:  Right flank pain. EXAM: CT ABDOMEN AND PELVIS WITHOUT CONTRAST TECHNIQUE:  Multidetector CT imaging of the abdomen and pelvis was performed following the standard protocol without IV contrast. COMPARISON:  CT of the abdomen pelvis 10/12/2015 FINDINGS: Lower chest:  No acute findings. Hepatobiliary: No mass visualized on this un-enhanced exam. Postcholecystectomy. Pancreas: No mass or inflammatory process identified on this un-enhanced exam. Spleen: Within normal limits in size. Adrenals/Urinary Tract: No evidence of urolithiasis or hydronephrosis. No definite mass visualized on this un-enhanced exam. Stomach/Bowel: No evidence of obstruction, inflammatory process, or abnormal fluid collections. Vascular/Lymphatic: No pathologically enlarged lymph nodes. No evidence of abdominal aortic aneurysm. Reproductive: No mass or other significant abnormality. 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 Other: None. Musculoskeletal:  No suspicious bone lesions identified. IMPRESSION: No evidence of obstructive uropathy or nephrolithiasis. Normal nonenhanced appearance of the solid  abdominal organs. 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 Electronically Signed   By: Ted Mcalpine M.D.   On: 02/22/2016 18:48   I have personally reviewed and evaluated these images and lab results as part of my medical decision-making.   EKG Interpretation None      MDM   Final diagnoses:  Flank pain  Urinary tract infection without hematuria, site unspecified    Valuation, consistent with flank pain and UTI. Doubt significant pyelonephritis, serious bacterial infection, sepsis, or metabolic instability  Nursing Notes Reviewed/ Care Coordinated Applicable Imaging Reviewed Interpretation of Laboratory Data incorporated into ED treatment  The patient appears reasonably screened and/or stabilized for discharge and I doubt any other medical condition or other [facility] requiring further screening, evaluation, or treatment in the ED at this time prior to discharge.  Plan: Home Medications- Keflex,  Norco; Home Treatments- rest; return here if the recommended treatment, does not improve the symptoms; Recommended follow up- PCP 1 week     Mancel Bale, MD 02/22/16 2050

## 2016-02-25 LAB — URINE CULTURE
Culture: >=100000 — AB
SPECIAL REQUESTS: NORMAL

## 2016-02-26 ENCOUNTER — Telehealth (HOSPITAL_BASED_OUTPATIENT_CLINIC_OR_DEPARTMENT_OTHER): Payer: Self-pay | Admitting: Emergency Medicine

## 2016-02-26 NOTE — Telephone Encounter (Signed)
Post ED Visit - Positive Culture Follow-up  Culture report reviewed by antimicrobial stewardship pharmacist:  []  , Pharm.D. []  Enzo Bi, Pharm.D., BCPS []  , Pharm.D. []  Celedonio Miyamoto, Pharm.D., BCPS []  [place], Garvin Fila.D., BCPS, AAHIVP []  , Pharm.D., BCPS, AAHIVP [x]  Georgina Pillion, Pharm.D. []  Rob , [place].D.  Positive urine culture Treated with cephalexin, organism sensitive to the same and no further patient follow-up is required at this time.  [address] 02/26/2016, 10:17 AM

## 2016-02-28 ENCOUNTER — Emergency Department (HOSPITAL_COMMUNITY)
Admission: EM | Admit: 2016-02-28 | Discharge: 2016-02-28 | Disposition: A | Discharge status: Home / Self Care | Payer: Medicare Other | Attending: Emergency Medicine | Admitting: Emergency Medicine

## 2016-02-28 ENCOUNTER — Encounter (HOSPITAL_COMMUNITY): Payer: Self-pay | Admitting: Emergency Medicine

## 2016-02-28 DIAGNOSIS — I10 Essential (primary) hypertension: Secondary | ICD-10-CM | POA: Insufficient documentation

## 2016-02-28 DIAGNOSIS — M542 Cervicalgia: Secondary | ICD-10-CM | POA: Diagnosis not present

## 2016-02-28 DIAGNOSIS — E119 Type 2 diabetes mellitus without complications: Secondary | ICD-10-CM | POA: Insufficient documentation

## 2016-02-28 DIAGNOSIS — Z87440 Personal history of urinary (tract) infections: Secondary | ICD-10-CM

## 2016-02-28 DIAGNOSIS — M5431 Sciatica, right side: Secondary | ICD-10-CM

## 2016-02-28 DIAGNOSIS — M5441 Lumbago with sciatica, right side: Secondary | ICD-10-CM | POA: Diagnosis not present

## 2016-02-28 DIAGNOSIS — M545 Low back pain: Secondary | ICD-10-CM | POA: Diagnosis present

## 2016-02-28 LAB — URINALYSIS, ROUTINE W REFLEX MICROSCOPIC
Glucose, UA: NEGATIVE mg/dL
KETONES UR: NEGATIVE mg/dL
Nitrite: NEGATIVE
Specific Gravity, Urine: 1.025 (ref 1.005–1.030)
pH: 6 (ref 5.0–8.0)

## 2016-02-28 LAB — URINE MICROSCOPIC-ADD ON

## 2016-02-28 LAB — POC URINE PREG, ED: PREG TEST UR: NEGATIVE

## 2016-02-28 MED ORDER — CYCLOBENZAPRINE HCL 10 MG PO TABS: 10.0000 mg | ORAL_TABLET | Freq: Three times a day (TID) | ORAL | Status: DC

## 2016-02-28 MED ORDER — MELOXICAM 15 MG PO TABS: 15.0000 mg | ORAL_TABLET | Freq: Every day | ORAL | Status: DC

## 2016-02-28 MED ORDER — STERILE WATER FOR INJECTION IJ SOLN
INTRAMUSCULAR | Status: AC
Start: 2016-02-28 — End: 2016-02-28
  Administered 2016-02-28: 18:00:00
  Filled 2016-02-28: qty 10

## 2016-02-28 MED ORDER — MORPHINE SULFATE (PF) 4 MG/ML IV SOLN
4.0000 mg | Freq: Once | INTRAVENOUS | Status: AC
  Administered 2016-02-28: 4 mg via INTRAMUSCULAR
  Filled 2016-02-28: qty 1

## 2016-02-28 MED ORDER — DEXAMETHASONE SODIUM PHOSPHATE 4 MG/ML IJ SOLN
8.0000 mg | Freq: Once | INTRAMUSCULAR | Status: AC
  Administered 2016-02-28: 8 mg via INTRAMUSCULAR
  Filled 2016-02-28: qty 2

## 2016-02-28 MED ORDER — DIAZEPAM 5 MG PO TABS
10.0000 mg | ORAL_TABLET | Freq: Once | ORAL | Status: AC
  Administered 2016-02-28: 10 mg via ORAL
  Filled 2016-02-28: qty 2

## 2016-02-28 MED ORDER — CEFTRIAXONE SODIUM 1 G IJ SOLR
1.0000 g | Freq: Once | INTRAMUSCULAR | Status: AC
  Administered 2016-02-28: 1 g via INTRAMUSCULAR
  Filled 2016-02-28: qty 10

## 2016-02-28 MED ORDER — DIPHENHYDRAMINE HCL 25 MG PO CAPS
25.0000 mg | ORAL_CAPSULE | Freq: Once | ORAL | Status: AC
  Administered 2016-02-28: 25 mg via ORAL
  Filled 2016-02-28: qty 1

## 2016-02-28 MED ORDER — PROMETHAZINE HCL 12.5 MG PO TABS
12.5000 mg | ORAL_TABLET | Freq: Once | ORAL | Status: AC
  Administered 2016-02-28: 12.5 mg via ORAL
  Filled 2016-02-28: qty 1

## 2016-02-28 NOTE — ED Notes (Signed)
Woke up this am with back pain and leg pain, rates pain 9/10.  Took tylenol at 1200, with no relief.  Denies any injury.  History of back pain.

## 2016-02-28 NOTE — Discharge Instructions (Signed)
Your vital signs are within normal limits. Your exam favors exacerbation of your sciatica. Please see Dr Selena Batten on Monday for management of this issue. Continue your current medications. Add flexeril and mobic  Until seen by Dr Selena Batten or a member of his team. Heat to the affected area may be helpful. Sciatica Sciatica is pain, weakness, numbness, or tingling along your sciatic nerve. The nerve starts in the lower back and runs down the back of each leg. Nerve damage or certain conditions pinch or put pressure on the sciatic nerve. This causes the pain, weakness, and other discomforts of sciatica. HOME CARE   Only take medicine as told by your doctor.  Apply ice to the affected area for 20 minutes. Do this 3-4 times a day for the first 48-72 hours. Then try heat in the same way.  Exercise, stretch, or do your usual activities if these do not make your pain worse.  Go to physical therapy as told by your doctor.  Keep all doctor visits as told.  Do not wear high heels or shoes that are not supportive.  Get a firm mattress if your mattress is too soft to lessen pain and discomfort. GET HELP RIGHT AWAY IF:   You cannot control when you poop (bowel movement) or pee (urinate).  You have more weakness in your lower back, lower belly (pelvis), butt (buttocks), or legs.  You have redness or puffiness (swelling) of your back.  You have a burning feeling when you pee.  You have pain that gets worse when you lie down.  You have pain that wakes you from your sleep.  Your pain is worse than past pain.  Your pain lasts longer than 4 weeks.  You are suddenly losing weight without reason. MAKE SURE YOU:   Understand these instructions.  Will watch this condition.  Will get help right away if you are not doing well or get worse.   This information is not intended to replace advice given to you by your health care provider. Make sure you discuss any questions you have with your health care  provider.   Document Released: 06/02/2008 Document Revised: 05/15/2015 Document Reviewed: 01/03/2012 Elsevier Interactive Patient Education [employer]

## 2016-02-28 NOTE — ED Provider Notes (Signed)
CSN: [id number]     Arrival date & time 02/28/16  1512 History   First MD Initiated Contact with Patient 02/28/16 1611     Chief Complaint  Patient presents with  . Back Pain     (Consider location/radiation/quality/duration/timing/severity/associated sxs/prior Treatment) Patient is a 45 y.o. female presenting with back pain. The history is provided by the patient.  Back Pain Location:  Lumbar spine Quality:  Aching and shooting Pain severity:  Moderate Pain is:  Same all the time Onset quality:  Gradual Duration:  1 day Timing:  Intermittent Progression:  Worsening Context comment:  Hx of back pain. Woke up this AM with pain in the lower back. Relieved by:  Nothing Worsened by:  Movement Associated symptoms: no abdominal pain, no bladder incontinence, no bowel incontinence and no perianal numbness   Risk factors: no recent surgery     Past Medical History  Diagnosis Date  . Hypertension   . Chronic back pain   . Sciatic pain     right  . Rib pain on right side     chronic  . Chronic neck pain   . Cervical radiculopathy   . Chronic knee pain   . Rheumatoid arthritis (HCC)     "Rhematoid factor positive but no symptoms" per EPIC notes 2012  . Fibromyalgia   . C. difficile diarrhea   . Diabetes mellitus without complication (HCC)     diet controlled  . Chronic abdominal pain    Past Surgical History  Procedure Laterality Date  . Tubal ligation    . Bladder surgery    . Cholecystectomy N/A 10/18/2015    Procedure: LAPAROSCOPIC CHOLECYSTECTOMY;  Surgeon: Franky Macho, MD;  Location: [facility];  Service: General;  Laterality: N/A;   Family History  Problem Relation Age of Onset  . Cancer Mother   . Diabetes Mother   . Colon cancer Father     AFTER AGE 37  . Crohn's disease Sister    Social History  Substance Use Topics  . Smoking status: Never Smoker   . Smokeless tobacco: Never Used  . Alcohol Use: No   OB History    Gravida Para Term Preterm AB TAB SAB  Ectopic Multiple Living   3 3 3       3      Review of Systems  Gastrointestinal: Negative for abdominal pain and bowel incontinence.  Genitourinary: Negative for bladder incontinence.  Musculoskeletal: Positive for back pain, arthralgias and neck pain.  All other systems reviewed and are negative.     Allergies  Mango flavor; Penicillins; Tramadol; Zofran; Bactrim; Keflex; and Toradol  Home Medications   Prior to Admission medications   Medication Sig Start Date End Date Taking? Authorizing Provider  acetaminophen (TYLENOL) 500 MG tablet Take 1,000 mg by mouth every 4 (four) hours as needed for moderate pain.    Historical Provider, MD  cephALEXin (KEFLEX) 500 MG capsule Take 1 capsule (500 mg total) by mouth 4 (four) times daily. 02/22/16   02/24/16, MD  HYDROcodone-acetaminophen (NORCO) 5-325 MG tablet Take 1 tablet by mouth every 6 (six) hours as needed for moderate pain. 02/22/16   02/24/16, MD  lisinopril-hydrochlorothiazide (PRINZIDE,ZESTORETIC) 20-25 MG tablet Take 1 tablet by mouth daily.    Historical Provider, MD   BP 121/41 mmHg  Pulse 72  Temp(Src) 97.9 F (36.6 C)  Resp 16  Ht 5' (1.524 m)  Wt 82.101 kg  BMI 35.35 kg/m2  SpO2 96%  LMP 01/27/2016 Physical  Exam  Constitutional: She is oriented to person, place, and time. She appears well-developed and well-nourished.  Non-toxic appearance.  HENT:  Head: Normocephalic.  Right Ear: Tympanic membrane and external ear normal.  Left Ear: Tympanic membrane and external ear normal.  Eyes: EOM and lids are normal. Pupils are equal, round, and reactive to light.  Neck: Normal range of motion. Neck supple. Carotid bruit is not present.  Cardiovascular: Normal rate, regular rhythm, normal heart sounds, intact distal pulses and normal pulses.   Pulmonary/Chest: Breath sounds normal. No respiratory distress.  Abdominal: Soft. Bowel sounds are normal. There is no tenderness. There is no guarding.  Musculoskeletal:        Lumbar back: She exhibits decreased range of motion, tenderness and pain.  Right buttock pain with SLR at 30 degrees.  Lymphadenopathy:       Head (right side): No submandibular adenopathy present.       Head (left side): No submandibular adenopathy present.    She has no cervical adenopathy.  Neurological: She is alert and oriented to person, place, and time. She has normal strength. No cranial nerve deficit or sensory deficit.  Gait steady. No foot drop. No areas of numbness.  Skin: Skin is warm and dry.  Psychiatric: She has a normal mood and affect. Her speech is normal.  Nursing note and vitals reviewed.   ED Course  Procedures (including critical care time) Labs Review Labs Reviewed  URINALYSIS, ROUTINE W REFLEX MICROSCOPIC (NOT AT [facility])  POC URINE PREG, ED    Imaging Review No results found. I have personally reviewed and evaluated these images and lab results as part of my medical decision-making.   EKG Interpretation None      MDM  NO gross neuro deficit noted. Vital signs stable. Pt being treated for UTI. Hx of sciatica( right).  Today's urine analysis reveals a hazy yellow specimen present. There remains a moderate amount of blood present there is still small leukocyte esterase, and to many to count white blood cells. We will add Rocephin to the current medication. Patient is in agreement with this plan. Pt treated with IM decadron, Morphine, oral valium, and promethazine. Pt received 60 percocet on May 16. Rx for flexeril and mobic given to the patient.   Final diagnoses:  Sciatica of right side    **I have reviewed nursing notes, vital signs, and all appropriate lab and imaging results for this patient.Ivery Quale, PA-C 02/28/16 1730  Ivery Quale, PA-C 02/28/16 1737  Vanetta Mulders, MD 02/29/16 [phone]

## 2016-03-04 ENCOUNTER — Emergency Department (HOSPITAL_COMMUNITY)
Admission: EM | Admit: 2016-03-04 | Discharge: 2016-03-04 | Disposition: A | Discharge status: Home / Self Care | Payer: Medicare Other | Attending: Dermatology | Admitting: Dermatology

## 2016-03-04 ENCOUNTER — Encounter (HOSPITAL_COMMUNITY): Payer: Self-pay | Admitting: Emergency Medicine

## 2016-03-04 DIAGNOSIS — R300 Dysuria: Secondary | ICD-10-CM | POA: Diagnosis not present

## 2016-03-04 DIAGNOSIS — R197 Diarrhea, unspecified: Secondary | ICD-10-CM | POA: Diagnosis not present

## 2016-03-04 DIAGNOSIS — M549 Dorsalgia, unspecified: Secondary | ICD-10-CM | POA: Diagnosis present

## 2016-03-04 DIAGNOSIS — E119 Type 2 diabetes mellitus without complications: Secondary | ICD-10-CM | POA: Insufficient documentation

## 2016-03-04 DIAGNOSIS — R112 Nausea with vomiting, unspecified: Secondary | ICD-10-CM | POA: Insufficient documentation

## 2016-03-04 DIAGNOSIS — Z5321 Procedure and treatment not carried out due to patient leaving prior to being seen by health care provider: Secondary | ICD-10-CM | POA: Insufficient documentation

## 2016-03-04 DIAGNOSIS — I10 Essential (primary) hypertension: Secondary | ICD-10-CM | POA: Insufficient documentation

## 2016-03-04 DIAGNOSIS — Z79899 Other long term (current) drug therapy: Secondary | ICD-10-CM | POA: Diagnosis not present

## 2016-03-04 LAB — COMPREHENSIVE METABOLIC PANEL
ALBUMIN: 3.8 g/dL (ref 3.5–5.0)
ALT: 17 U/L (ref 14–54)
ANION GAP: 7 (ref 5–15)
AST: 16 U/L (ref 15–41)
Alkaline Phosphatase: 31 U/L — ABNORMAL LOW (ref 38–126)
BILIRUBIN TOTAL: 0.7 mg/dL (ref 0.3–1.2)
BUN: 6 mg/dL (ref 6–20)
CALCIUM: 8.7 mg/dL — AB (ref 8.9–10.3)
CHLORIDE: 104 mmol/L (ref 101–111)
CO2: 26 mmol/L (ref 22–32)
Creatinine, Ser: 0.68 mg/dL (ref 0.44–1.00)
GFR calc Af Amer: >60 mL/min (ref >60)
Glucose, Bld: 99 mg/dL (ref 65–99)
POTASSIUM: 3 mmol/L — AB (ref 3.5–5.1)
Sodium: 137 mmol/L (ref 135–145)
Total Protein: 7 g/dL (ref 6.5–8.1)

## 2016-03-04 LAB — CBC
HEMATOCRIT: 37.3 % (ref 36.0–46.0)
HEMOGLOBIN: 12.1 g/dL (ref 12.0–15.0)
MCH: 26.5 pg (ref 26.0–34.0)
MCHC: 32.4 g/dL (ref 30.0–36.0)
MCV: 81.6 fL (ref 78.0–100.0)
Platelets: 308 10*3/uL (ref 150–400)
RBC: 4.57 MIL/uL (ref 3.87–5.11)
RDW: 15.1 % (ref 11.5–15.5)
WBC: 8.2 10*3/uL (ref 4.0–10.5)

## 2016-03-04 LAB — LIPASE, BLOOD: LIPASE: 38 U/L (ref 11–51)

## 2016-03-04 NOTE — ED Notes (Signed)
Looked for pt x 3 without seeing pt in waiting room

## 2016-03-04 NOTE — ED Notes (Signed)
Pt c/o bilateral back pain with n/v/d and dysuria. States she is currently being treated for a kidney infection.

## 2016-03-04 NOTE — ED Notes (Signed)
Called patient from waiting area to take her to room #2.  No answer.

## 2016-03-13 ENCOUNTER — Emergency Department (HOSPITAL_COMMUNITY)
Admission: EM | Admit: 2016-03-13 | Discharge: 2016-03-13 | Disposition: A | Discharge status: Home / Self Care | Payer: Medicare Other | Attending: Emergency Medicine | Admitting: Emergency Medicine

## 2016-03-13 ENCOUNTER — Encounter (HOSPITAL_COMMUNITY): Payer: Self-pay | Admitting: *Deleted

## 2016-03-13 DIAGNOSIS — R197 Diarrhea, unspecified: Secondary | ICD-10-CM | POA: Insufficient documentation

## 2016-03-13 DIAGNOSIS — I10 Essential (primary) hypertension: Secondary | ICD-10-CM | POA: Diagnosis not present

## 2016-03-13 DIAGNOSIS — R1110 Vomiting, unspecified: Secondary | ICD-10-CM

## 2016-03-13 DIAGNOSIS — Z79899 Other long term (current) drug therapy: Secondary | ICD-10-CM | POA: Insufficient documentation

## 2016-03-13 DIAGNOSIS — M549 Dorsalgia, unspecified: Secondary | ICD-10-CM

## 2016-03-13 DIAGNOSIS — R109 Unspecified abdominal pain: Secondary | ICD-10-CM | POA: Insufficient documentation

## 2016-03-13 DIAGNOSIS — E119 Type 2 diabetes mellitus without complications: Secondary | ICD-10-CM | POA: Insufficient documentation

## 2016-03-13 DIAGNOSIS — M545 Low back pain: Secondary | ICD-10-CM | POA: Diagnosis not present

## 2016-03-13 DIAGNOSIS — R112 Nausea with vomiting, unspecified: Secondary | ICD-10-CM | POA: Diagnosis present

## 2016-03-13 DIAGNOSIS — G8929 Other chronic pain: Secondary | ICD-10-CM | POA: Insufficient documentation

## 2016-03-13 LAB — URINALYSIS, ROUTINE W REFLEX MICROSCOPIC
Glucose, UA: NEGATIVE mg/dL
Ketones, ur: 40 mg/dL — AB
NITRITE: NEGATIVE
PROTEIN: NEGATIVE mg/dL
Specific Gravity, Urine: >1.03 — ABNORMAL HIGH (ref 1.005–1.030)
pH: 5.5 (ref 5.0–8.0)

## 2016-03-13 LAB — COMPREHENSIVE METABOLIC PANEL
ALT: 13 U/L — AB (ref 14–54)
AST: 15 U/L (ref 15–41)
Albumin: 3.9 g/dL (ref 3.5–5.0)
Alkaline Phosphatase: 31 U/L — ABNORMAL LOW (ref 38–126)
Anion gap: 6 (ref 5–15)
BUN: 8 mg/dL (ref 6–20)
CHLORIDE: 106 mmol/L (ref 101–111)
CO2: 23 mmol/L (ref 22–32)
CREATININE: 0.67 mg/dL (ref 0.44–1.00)
Calcium: 8.8 mg/dL — ABNORMAL LOW (ref 8.9–10.3)
GFR calc Af Amer: >60 mL/min (ref >60)
Glucose, Bld: 91 mg/dL (ref 65–99)
Potassium: 3.6 mmol/L (ref 3.5–5.1)
Sodium: 135 mmol/L (ref 135–145)
Total Bilirubin: 0.7 mg/dL (ref 0.3–1.2)
Total Protein: 7.1 g/dL (ref 6.5–8.1)

## 2016-03-13 LAB — URINE MICROSCOPIC-ADD ON

## 2016-03-13 LAB — CBC WITH DIFFERENTIAL/PLATELET
BASOS ABS: 0 10*3/uL (ref 0.0–0.1)
Basophils Relative: 0 %
EOS PCT: 1 %
Eosinophils Absolute: 0.1 10*3/uL (ref 0.0–0.7)
HEMATOCRIT: 37.9 % (ref 36.0–46.0)
HEMOGLOBIN: 12.5 g/dL (ref 12.0–15.0)
LYMPHS PCT: 33 %
Lymphs Abs: 2.8 10*3/uL (ref 0.7–4.0)
MCH: 26.7 pg (ref 26.0–34.0)
MCHC: 33 g/dL (ref 30.0–36.0)
MCV: 81 fL (ref 78.0–100.0)
Monocytes Absolute: 0.4 10*3/uL (ref 0.1–1.0)
Monocytes Relative: 5 %
NEUTROS ABS: 5.3 10*3/uL (ref 1.7–7.7)
NEUTROS PCT: 61 %
PLATELETS: 270 10*3/uL (ref 150–400)
RBC: 4.68 MIL/uL (ref 3.87–5.11)
RDW: 15.2 % (ref 11.5–15.5)
WBC: 8.6 10*3/uL (ref 4.0–10.5)

## 2016-03-13 LAB — PREGNANCY, URINE: Preg Test, Ur: NEGATIVE

## 2016-03-13 LAB — LIPASE, BLOOD: LIPASE: 40 U/L (ref 11–51)

## 2016-03-13 MED ORDER — ONDANSETRON HCL 4 MG PO TABS: 4.0000 mg | ORAL_TABLET | Freq: Four times a day (QID) | ORAL | Status: DC | PRN

## 2016-03-13 MED ORDER — SODIUM CHLORIDE 0.9 % IV BOLUS (SEPSIS)
1000.0000 mL | Freq: Once | INTRAVENOUS | Status: AC
  Administered 2016-03-13: 1000 mL via INTRAVENOUS

## 2016-03-13 MED ORDER — ONDANSETRON HCL 4 MG/2ML IJ SOLN
4.0000 mg | Freq: Once | INTRAMUSCULAR | Status: AC
  Administered 2016-03-13: 4 mg via INTRAVENOUS
  Filled 2016-03-13: qty 2

## 2016-03-13 MED ORDER — SODIUM CHLORIDE 0.9 % IV BOLUS (SEPSIS): 1000.0000 mL | Freq: Once | INTRAVENOUS | Status: DC

## 2016-03-13 NOTE — ED Provider Notes (Signed)
CSN: [id number]     Arrival date & time 03/13/16  1914 History   First MD Initiated Contact with Patient 03/13/16 2001     Chief Complaint  Patient presents with  . Flank Pain     (Consider location/radiation/quality/duration/timing/severity/associated sxs/prior Treatment) HPI Patient with numerous visits over the last year for chronic abdominal pain and back pain. She presents with vomiting and diarrhea that started at 3 AM this morning. She states she's had 7-8 times each. Diarrhea is watery with no blood. No fever or chills. Patient states she has been getting diaphoretic with chills. She has no dysuria but has had some increased frequency. No known sick contacts. She was seen in May and treated for UTI. Patient states that she took the full course of antibiotics. She is taking no medication at home for discomfort. She complains of bilateral back pain without radiation into the legs. She states this is her "kidney pain". Patient does have a history of C. difficile.  Past Medical History  Diagnosis Date  . Hypertension   . Chronic back pain   . Sciatic pain     right  . Rib pain on right side     chronic  . Chronic neck pain   . Cervical radiculopathy   . Chronic knee pain   . Rheumatoid arthritis (HCC)     "Rhematoid factor positive but no symptoms" per EPIC notes 2012  . Fibromyalgia   . C. difficile diarrhea   . Diabetes mellitus without complication (HCC)     diet controlled  . Chronic abdominal pain    Past Surgical History  Procedure Laterality Date  . Tubal ligation    . Bladder surgery    . Cholecystectomy N/A 10/18/2015    Procedure: LAPAROSCOPIC CHOLECYSTECTOMY;  Surgeon: Franky Macho, MD;  Location: [facility];  Service: General;  Laterality: N/A;   Family History  Problem Relation Age of Onset  . Cancer Mother   . Diabetes Mother   . Colon cancer Father     AFTER AGE 1  . Crohn's disease Sister    Social History  Substance Use Topics  . Smoking status: Never  Smoker   . Smokeless tobacco: Never Used  . Alcohol Use: No   OB History    Gravida Para Term Preterm AB TAB SAB Ectopic Multiple Living   3 3 3       3      Review of Systems  Constitutional: Negative for fever and chills.  Cardiovascular: Negative for chest pain.  Gastrointestinal: Positive for nausea, vomiting and diarrhea. Negative for abdominal pain and constipation.  Genitourinary: Positive for frequency and flank pain. Negative for dysuria and difficulty urinating.  Musculoskeletal: Positive for myalgias and back pain. Negative for neck pain and neck stiffness.  Skin: Negative for rash and wound.  Neurological: Negative for dizziness, weakness, light-headedness, numbness and headaches.  All other systems reviewed and are negative.     Allergies  Mango flavor; Penicillins; Tramadol; Zofran; Bactrim; Keflex; and Toradol  Home Medications   Prior to Admission medications   Medication Sig Start Date End Date Taking? Authorizing Provider  acetaminophen (TYLENOL) 500 MG tablet Take 1,000 mg by mouth every 4 (four) hours as needed for moderate pain.    Historical Provider, MD  cephALEXin (KEFLEX) 500 MG capsule Take 1 capsule (500 mg total) by mouth 4 (four) times daily. 02/22/16   02/24/16, MD  cyclobenzaprine (FLEXERIL) 10 MG tablet Take 1 tablet (10 mg total) by mouth  3 (three) times daily. 02/28/16   Ivery Quale, PA-C  HYDROcodone-acetaminophen (NORCO) 5-325 MG tablet Take 1 tablet by mouth every 6 (six) hours as needed for moderate pain. Patient not taking: Reported on 02/28/2016 02/22/16   Mancel Bale, MD  lisinopril-hydrochlorothiazide (PRINZIDE,ZESTORETIC) 20-25 MG tablet Take 1 tablet by mouth daily.    Historical Provider, MD  meloxicam (MOBIC) 15 MG tablet Take 1 tablet (15 mg total) by mouth daily. 02/28/16   Ivery Quale, PA-C  ondansetron (ZOFRAN) 4 MG tablet Take 1 tablet (4 mg total) by mouth 4 (four) times daily as needed for nausea or vomiting. 03/13/16   Loren Racer, MD   BP 140/89 mmHg  Pulse 72  Temp(Src) 98.4 F (36.9 C) (Oral)  Resp 16  Ht 5\' 1"  (1.549 m)  Wt 184 lb 7 oz (83.66 kg)  BMI 34.87 kg/m2  SpO2 95%  LMP 01/27/2016 Physical Exam  Constitutional: She is oriented to person, place, and time. She appears well-developed and well-nourished. No distress.  HENT:  Head: Normocephalic and atraumatic.  Mouth/Throat: Oropharynx is clear and moist. No oropharyngeal exudate.  Eyes: EOM are normal. Pupils are equal, round, and reactive to light.  Neck: Normal range of motion. Neck supple.  Cardiovascular: Normal rate and regular rhythm.   Pulmonary/Chest: Effort normal and breath sounds normal. No respiratory distress. She has no wheezes. She has no rales.  Abdominal: Soft. Bowel sounds are normal. She exhibits no distension and no mass. There is no tenderness. There is no rebound and no guarding.  Soft abdomen without focal tenderness  Musculoskeletal: Normal range of motion. She exhibits no edema or tenderness.  Patient has bilateral lumbar paraspinal tenderness to palpation. No definite CVA tenderness.  Neurological: She is alert and oriented to person, place, and time.  5/5 motor in all extremities. Sensation is fully intact.  Skin: Skin is warm and dry. No rash noted. No erythema.  Psychiatric: She has a normal mood and affect. Her behavior is normal.  Nursing note and vitals reviewed.   ED Course  Procedures (including critical care time) Labs Review Labs Reviewed  URINALYSIS, ROUTINE W REFLEX MICROSCOPIC (NOT AT [facility]) - Abnormal; Notable for the following:    APPearance HAZY (*)    Specific Gravity, Urine >1.030 (*)    Hgb urine dipstick SMALL (*)    Bilirubin Urine SMALL (*)    Ketones, ur 40 (*)    Leukocytes, UA SMALL (*)    All other components within normal limits  COMPREHENSIVE METABOLIC PANEL - Abnormal; Notable for the following:    Calcium 8.8 (*)    ALT 13 (*)    Alkaline Phosphatase 31 (*)    All other  components within normal limits  URINE MICROSCOPIC-ADD ON - Abnormal; Notable for the following:    Squamous Epithelial / LPF 6-30 (*)    Bacteria, UA RARE (*)    All other components within normal limits  GASTROINTESTINAL PANEL BY PCR, STOOL (REPLACES STOOL CULTURE)  URINE CULTURE  CBC WITH DIFFERENTIAL/PLATELET  LIPASE, BLOOD  PREGNANCY, URINE    Imaging Review No results found. I have personally reviewed and evaluated these images and lab results as part of my medical decision-making.   EKG Interpretation None      MDM   Final diagnoses:  Vomiting and diarrhea  Chronic back pain    Patient with no diarrhea or vomiting while in the emergency department. She is very well-appearing. Her vital signs are normal. She has a normal laboratory  workup including normal white blood cell count. She has contaminated urine specimen. Will send for culture. She's been given IV fluids and will be discharged home with antiemetics. She's been unable to provide [place] a stool sample for C. difficile testing. Recommending follow-up with her primary physician. Patient's had 3 CT abdomen scan in the past 7 months. None demonstrated renal stones. Do not believe that repeat imaging is necessary at this point. She's been given return precautions and has voiced understanding.    Loren Racer, MD 03/13/16 2157

## 2016-03-13 NOTE — Discharge Instructions (Signed)
Call and make an appointment to follow-up with your primary physician. Take medication as prescribed.   Back Pain, Adult Back pain is very common in adults.The cause of back pain is rarely dangerous and the pain often gets better over time.The cause of your back pain may not be known. Some common causes of back pain include:  Strain of the muscles or ligaments supporting the spine.  Wear and tear (degeneration) of the spinal disks.  Arthritis.  Direct injury to the back. For many people, back pain may return. Since back pain is rarely dangerous, most people can learn to manage this condition on their own. HOME CARE INSTRUCTIONS Watch your back pain for any changes. The following actions may help to lessen any discomfort you are feeling:  Remain active. It is stressful on your back to sit or stand in one place for long periods of time. Do not sit, drive, or stand in one place for more than 30 minutes at a time. Take short walks on even surfaces as soon as you are able.Try to increase the length of time you walk each day.  Exercise regularly as directed by your health care provider. Exercise helps your back heal faster. It also helps avoid future injury by keeping your muscles strong and flexible.  Do not stay in bed.Resting more than 1-2 days can delay your recovery.  Pay attention to your body when you bend and lift. The most comfortable positions are those that put less stress on your recovering back. Always use proper lifting techniques, including:  Bending your knees.  Keeping the load close to your body.  Avoiding twisting.  Find a comfortable position to sleep. Use a firm mattress and lie on your side with your knees slightly bent. If you lie on your back, put a pillow under your knees.  Avoid feeling anxious or stressed.Stress increases muscle tension and can worsen back pain.It is important to recognize when you are anxious or stressed and learn ways to manage it, such as  with exercise.  Take medicines only as directed by your health care provider. Over-the-counter medicines to reduce pain and inflammation are often the most helpful.Your health care provider may prescribe muscle relaxant drugs.These medicines help dull your pain so you can more quickly return to your normal activities and healthy exercise.  Apply ice to the injured area:  Put ice in a plastic bag.  Place a towel between your skin and the bag.  Leave the ice on for 20 minutes, 2-3 times a day for the first 2-3 days. After that, ice and heat may be alternated to reduce pain and spasms.  Maintain a healthy weight. Excess weight puts extra stress on your back and makes it difficult to maintain good posture. SEEK MEDICAL CARE IF:  You have pain that is not relieved with rest or medicine.  You have increasing pain going down into the legs or buttocks.  You have pain that does not improve in one week.  You have night pain.  You lose weight.  You have a fever or chills. SEEK IMMEDIATE MEDICAL CARE IF:   You develop new bowel or bladder control problems.  You have unusual weakness or numbness in your arms or legs.  You develop nausea or vomiting.  You develop abdominal pain.  You feel faint.   This information is not intended to replace advice given to you by your health care provider. Make sure you discuss any questions you have with your health care provider.  Document Released: 08/24/2005 Document Revised: 09/14/2014 Document Reviewed: 12/26/2013 Elsevier Interactive Patient Education 2016 Elsevier Inc[employer]  Diarrhea Diarrhea is frequent loose and watery bowel movements. It can cause you to feel weak and dehydrated. Dehydration can cause you to become tired and thirsty, have a dry mouth, and have decreased urination that often is dark yellow. Diarrhea is a sign of another problem, most often an infection that will not last long. In most cases, diarrhea typically lasts 2-3 days.  However, it can last longer if it is a sign of something more serious. It is important to treat your diarrhea as directed by your caregiver to lessen or prevent future episodes of diarrhea. CAUSES  Some common causes include:  Gastrointestinal infections caused by viruses, bacteria, or parasites.  Food poisoning or food allergies.  Certain medicines, such as antibiotics, chemotherapy, and laxatives.  Artificial sweeteners and fructose.  Digestive disorders. HOME CARE INSTRUCTIONS  Ensure adequate fluid intake (hydration): Have 1 cup (8 oz) of fluid for each diarrhea episode. Avoid fluids that contain simple sugars or sports drinks, fruit juices, whole milk products, and sodas. Your urine should be clear or pale yellow if you are drinking enough fluids. Hydrate with an oral rehydration solution that you can purchase at pharmacies, retail stores, and online. You can prepare an oral rehydration solution at home by mixing the following ingredients together:   - tsp table salt.   tsp baking soda.   tsp salt substitute containing potassium chloride.  1  tablespoons sugar.  1 L (34 oz) of water.  Certain foods and beverages may increase the speed at which food moves through the gastrointestinal (GI) tract. These foods and beverages should be avoided and include:  Caffeinated and alcoholic beverages.  High-fiber foods, such as raw fruits and vegetables, nuts, seeds, and whole grain breads and cereals.  Foods and beverages sweetened with sugar alcohols, such as xylitol, sorbitol, and mannitol.  Some foods may be well tolerated and may help thicken stool including:  Starchy foods, such as rice, toast, pasta, low-sugar cereal, oatmeal, grits, baked potatoes, crackers, and bagels.  Bananas.  Applesauce.  Add probiotic-rich foods to help increase healthy bacteria in the GI tract, such as yogurt and fermented milk products.  Wash your hands well after each diarrhea episode.  Only  take over-the-counter or prescription medicines as directed by your caregiver.  Take a warm bath to relieve any burning or pain from frequent diarrhea episodes. SEEK IMMEDIATE MEDICAL CARE IF:   You are unable to keep fluids down.  You have persistent vomiting.  You have blood in your stool, or your stools are black and tarry.  You do not urinate in 6-8 hours, or there is only a small amount of very dark urine.  You have abdominal pain that increases or localizes.  You have weakness, dizziness, confusion, or light-headedness.  You have a severe headache.  Your diarrhea gets worse or does not get better.  You have a fever or persistent symptoms for more than 2-3 days.  You have a fever and your symptoms suddenly get worse. MAKE SURE YOU:   Understand these instructions.  Will watch your condition.  Will get help right away if you are not doing well or get worse.   This information is not intended to replace advice given to you by your health care provider. Make sure you discuss any questions you have with your health care provider.   Document Released: 08/14/2002 Document Revised: 09/14/2014 Document Reviewed: 05/01/2012 Elsevier  Interactive Patient Education ©2016 Elsevier Inc. ° °

## 2016-03-13 NOTE — ED Notes (Addendum)
Pt reports she is having bilateral flank pain with n/v/d that started this morning. Pt denies dysuria but reports urinary frequency. Pt states she has been having episodes of loose, watery diarrhea and at times can not make it to the bathroom in time. Pt also reporting she had a miscarriage 2 weeks ago.

## 2016-03-16 LAB — URINE CULTURE

## 2016-04-03 ENCOUNTER — Emergency Department (HOSPITAL_COMMUNITY)
Admission: EM | Admit: 2016-04-03 | Discharge: 2016-04-04 | Disposition: A | Discharge status: Home / Self Care | Payer: Medicare Other | Attending: Emergency Medicine | Admitting: Emergency Medicine

## 2016-04-03 ENCOUNTER — Encounter (HOSPITAL_COMMUNITY): Payer: Self-pay

## 2016-04-03 DIAGNOSIS — N390 Urinary tract infection, site not specified: Secondary | ICD-10-CM | POA: Diagnosis not present

## 2016-04-03 DIAGNOSIS — R339 Retention of urine, unspecified: Secondary | ICD-10-CM | POA: Diagnosis present

## 2016-04-03 DIAGNOSIS — E119 Type 2 diabetes mellitus without complications: Secondary | ICD-10-CM | POA: Insufficient documentation

## 2016-04-03 DIAGNOSIS — I10 Essential (primary) hypertension: Secondary | ICD-10-CM | POA: Diagnosis not present

## 2016-04-03 DIAGNOSIS — Z79899 Other long term (current) drug therapy: Secondary | ICD-10-CM | POA: Insufficient documentation

## 2016-04-03 NOTE — ED Triage Notes (Signed)
Unable to urinate X3 hours. Lower back pain X1 week. Also c/o nausea

## 2016-04-04 DIAGNOSIS — N390 Urinary tract infection, site not specified: Secondary | ICD-10-CM | POA: Diagnosis not present

## 2016-04-04 LAB — URINALYSIS, ROUTINE W REFLEX MICROSCOPIC
Bilirubin Urine: NEGATIVE
Glucose, UA: NEGATIVE mg/dL
KETONES UR: NEGATIVE mg/dL
NITRITE: POSITIVE — AB
PH: 6 (ref 5.0–8.0)
PROTEIN: 30 mg/dL — AB
Specific Gravity, Urine: 1.025 (ref 1.005–1.030)

## 2016-04-04 LAB — POC URINE PREG, ED: Preg Test, Ur: NEGATIVE

## 2016-04-04 LAB — URINE MICROSCOPIC-ADD ON

## 2016-04-04 MED ORDER — PROMETHAZINE HCL 25 MG PO TABS: 25.0000 mg | ORAL_TABLET | Freq: Four times a day (QID) | ORAL | 0 refills | Status: DC | PRN

## 2016-04-04 MED ORDER — CEFTRIAXONE SODIUM 1 G IJ SOLR
1.0000 g | Freq: Once | INTRAMUSCULAR | Status: AC
  Administered 2016-04-04: 1 g via INTRAMUSCULAR
  Filled 2016-04-04: qty 10

## 2016-04-04 MED ORDER — STERILE WATER FOR INJECTION IJ SOLN
INTRAMUSCULAR | Status: AC
  Filled 2016-04-04: qty 10

## 2016-04-04 MED ORDER — IBUPROFEN 800 MG PO TABS
800.0000 mg | ORAL_TABLET | Freq: Three times a day (TID) | ORAL | 0 refills | Status: DC | PRN
Start: 2016-04-04 — End: 2016-04-25

## 2016-04-04 MED ORDER — CEPHALEXIN 500 MG PO CAPS: 500.0000 mg | ORAL_CAPSULE | Freq: Four times a day (QID) | ORAL | 0 refills | Status: DC

## 2016-04-04 MED ORDER — IBUPROFEN 800 MG PO TABS
800.0000 mg | ORAL_TABLET | Freq: Once | ORAL | Status: AC
  Administered 2016-04-04: 800 mg via ORAL
  Filled 2016-04-04: qty 1

## 2016-04-04 NOTE — ED Provider Notes (Signed)
TIME SEEN: 12:18 AM  CHIEF COMPLAINT: Difficulty Urinating  HPI: HPI Comments: Regina Patton is a 45 y.o. female with hypertension, diabetes, chronic pain who is well-known to our emergency department who presents to the Emergency Department complaining of sudden onset, constant, difficulty urinating beginning today. Pt states she experienced severe urinary frequency throughout the day 2 days ago but now reports difficulty urinating. She also notes mild dysuria yesterday as well as nausea and decreased appetite. Pt notes h/o multiple UTI's in the past but states she has never experienced difficulty urinating but states otherwise the spinal similar. She also notes bilateral mid-back pain. No history of kidney stones. She denies hematuria, fever, vomiting, diarrhea, vaginal bleeding, vaginal discharge, or any other associated symptoms.   ROS: See HPI Constitutional: no fever  Eyes: no drainage  ENT: no runny nose   Cardiovascular:  no chest pain  Resp: no SOB  GI: no vomiting GU: dysuria (mild) Integumentary: no rash  Allergy: no hives  Musculoskeletal: no leg swelling  Neurological: no slurred speech ROS otherwise negative  PAST MEDICAL HISTORY/PAST SURGICAL HISTORY:  Past Medical History:  Diagnosis Date  . C. difficile diarrhea   . Cervical radiculopathy   . Chronic abdominal pain   . Chronic back pain   . Chronic knee pain   . Chronic neck pain   . Diabetes mellitus without complication (HCC)    diet controlled  . Fibromyalgia   . Hypertension   . Rheumatoid arthritis (HCC)    "Rhematoid factor positive but no symptoms" per EPIC notes 2012  . Rib pain on right side    chronic  . Sciatic pain    right    MEDICATIONS:  Prior to Admission medications   Medication Sig Start Date End Date Taking? Authorizing Provider  acetaminophen (TYLENOL) 500 MG tablet Take 1,000 mg by mouth every 4 (four) hours as needed for moderate pain.   Yes Historical Provider, MD  cephALEXin  (KEFLEX) 500 MG capsule Take 1 capsule (500 mg total) by mouth 4 (four) times daily. Patient not taking: Reported on 03/13/2016 02/22/16   Mancel Bale, MD  cyclobenzaprine (FLEXERIL) 10 MG tablet Take 1 tablet (10 mg total) by mouth 3 (three) times daily. Patient not taking: Reported on 03/13/2016 02/28/16   Ivery Quale, PA-C  HYDROcodone-acetaminophen (NORCO) 5-325 MG tablet Take 1 tablet by mouth every 6 (six) hours as needed for moderate pain. Patient not taking: Reported on 02/28/2016 02/22/16   Mancel Bale, MD  lisinopril-hydrochlorothiazide (PRINZIDE,ZESTORETIC) 20-25 MG tablet Take 1 tablet by mouth daily.    Historical Provider, MD  meloxicam (MOBIC) 15 MG tablet Take 1 tablet (15 mg total) by mouth daily. Patient not taking: Reported on 03/13/2016 02/28/16   Ivery Quale, PA-C  ondansetron (ZOFRAN) 4 MG tablet Take 1 tablet (4 mg total) by mouth 4 (four) times daily as needed for nausea or vomiting. 03/13/16   Loren Racer, MD    ALLERGIES:  Allergies  Allergen Reactions  . Mango Flavor Anaphylaxis and Swelling    Lips swelling  . Penicillins Anaphylaxis and Hives    Has patient had a PCN reaction causing immediate rash, facial/tongue/throat swelling, SOB or lightheadedness with hypotension: Yes Has patient had a PCN reaction causing severe rash involving mucus membranes or skin necrosis: No Has patient had a PCN reaction that required hospitalization No Has patient had a PCN reaction occurring within the last 10 years: No If all of the above answers are "NO", then may proceed with Cephalosporin  use.   . Tramadol Nausea And Vomiting  . Zofran [Ondansetron Hcl] Other (See Comments)    headache  . Bactrim [Sulfamethoxazole-Trimethoprim] Itching and Rash  . Keflex [Cephalexin] Hives, Itching and Rash  . Toradol [Ketorolac Tromethamine] Rash    SOCIAL HISTORY:  Social History  Substance Use Topics  . Smoking status: Never Smoker  . Smokeless tobacco: Never Used  . Alcohol use  No    FAMILY HISTORY: Family History  Problem Relation Age of Onset  . Cancer Mother   . Diabetes Mother   . Colon cancer Father     AFTER AGE 23  . Crohn's disease Sister     EXAM: BP 128/92 (BP Location: Right Arm)   Pulse (!) 56   Temp 97.8 F (36.6 C) (Oral)   Resp 18   Ht 5' (1.524 m)   Wt 180 lb (81.6 kg)   LMP 03/16/2016   SpO2 99%   BMI 35.15 kg/m  CONSTITUTIONAL: Alert and oriented and responds appropriately to questions. Well-appearing; well-nourished, obese, afebrile, nontoxic HEAD: Normocephalic EYES: Conjunctivae clear, PERRL ENT: normal nose; no rhinorrhea; moist mucous membranes NECK: Supple, no meningismus, no LAD  CARD: RRR; S1 and S2 appreciated; no murmurs, no clicks, no rubs, no gallops RESP: Normal chest excursion without splinting or tachypnea; breath sounds clear and equal bilaterally; no wheezes, no rhonchi, no rales, no hypoxia or respiratory distress, speaking full sentences ABD/GI: Normal bowel sounds; non-distended; soft, non-tender, no rebound, no guarding, no peritoneal signs BACK:  The back appears normal and is non-tender to palpation, there is no CVA tenderness, no midline tenderness or step-off or deformity EXT: Normal ROM in all joints; non-tender to palpation; no edema; normal capillary refill; no cyanosis, no calf tenderness or swelling    SKIN: Normal color for age and race; warm; no rash NEURO: Moves all extremities equally, sensation to light touch intact diffusely, cranial nerves II through XII intact PSYCH: The patient's mood and manner are appropriate. Grooming and personal hygiene are appropriate.  MEDICAL DECISION MAKING: Patient here complains of symptoms that suggest UTI. Doubt cauda equina, spinal stenosis. No neurologic deficits on exam. Bladder scan reveals 154 mL. She states she has been able to urinate but states she feels that she cannot empty her bladder and feels like it is "full" currently. Suspect this is bladder spasm.  Suspect she has a UTI. Urinalysis, urine culture pending. She has had multiple urinary tract infections in the past that have grown Escherichia coli that have been resistant to penicillin, Cipro.    ED PROGRESS:  Patient does have a nitrite-positive UTI. Given her back pain, do not feel treating her with Macrobid would be appropriate case there is any mild pyelonephritis. She has had many CT scans in the past. I do not feel repeat imaging is indicated today. She states she has taken Keflex in the past and has helped her symptoms. It is listed as an allergy but she states that she develops itching only and no hives, angioedema, anaphylactic reaction. States that she takes Benadryl and feels fine when taking this medication. We'll give her a dose of IM Rocephin currently and discharged on Keflex. She is comfortable with this plan. She has PCP follow-up.    At this time, I do not feel there is any life-threatening condition present. I have reviewed and discussed all results (EKG, imaging, lab, urine as appropriate), exam findings with patient. I have reviewed nursing notes and appropriate previous records.  I feel the patient  is safe to be discharged home without further emergent workup. Discussed usual and customary return precautions. Patient and family (if present) verbalize understanding and are comfortable with this plan.  Patient will follow-up with their primary care provider. If they do not have a primary care provider, information for follow-up has been provided to them. All questions have been answered.     Layla Maw Ward, DO 04/04/16 0134

## 2016-04-07 LAB — URINE CULTURE

## 2016-04-08 ENCOUNTER — Telehealth (HOSPITAL_BASED_OUTPATIENT_CLINIC_OR_DEPARTMENT_OTHER): Payer: Self-pay | Admitting: Emergency Medicine

## 2016-04-08 NOTE — Telephone Encounter (Signed)
Post ED Visit - Positive Culture Follow-up  Culture report reviewed by antimicrobial stewardship pharmacist:  []  , Pharm.D. []  Enzo Bi, .D., BCPS []  Celedonio Miyamoto, Pharm.D. []  [address], Pharm.D., BCPS []  [place], Garvin Fila.D., BCPS, AAHIVP []  , Pharm.D., BCPS, AAHIVP []  Georgina Pillion, .D. []  [place], [address].D. PharmD  Positive urine culture Treated with cephalexin, organism sensitive to the same and no further patient follow-up is required at this time.  Regina Patton 04/08/2016, 9:49 AM

## 2016-04-23 ENCOUNTER — Emergency Department (HOSPITAL_COMMUNITY)
Admission: EM | Admit: 2016-04-23 | Discharge: 2016-04-23 | Disposition: A | Discharge status: Home / Self Care | Payer: Medicare Other | Attending: Dermatology | Admitting: Dermatology

## 2016-04-23 ENCOUNTER — Encounter (HOSPITAL_COMMUNITY): Payer: Self-pay

## 2016-04-23 DIAGNOSIS — E119 Type 2 diabetes mellitus without complications: Secondary | ICD-10-CM | POA: Diagnosis not present

## 2016-04-23 DIAGNOSIS — Y929 Unspecified place or not applicable: Secondary | ICD-10-CM | POA: Diagnosis not present

## 2016-04-23 DIAGNOSIS — I10 Essential (primary) hypertension: Secondary | ICD-10-CM | POA: Insufficient documentation

## 2016-04-23 DIAGNOSIS — Y939 Activity, unspecified: Secondary | ICD-10-CM | POA: Diagnosis not present

## 2016-04-23 DIAGNOSIS — W57XXXA Bitten or stung by nonvenomous insect and other nonvenomous arthropods, initial encounter: Secondary | ICD-10-CM | POA: Insufficient documentation

## 2016-04-23 DIAGNOSIS — S30861A Insect bite (nonvenomous) of abdominal wall, initial encounter: Secondary | ICD-10-CM | POA: Insufficient documentation

## 2016-04-23 DIAGNOSIS — Y999 Unspecified external cause status: Secondary | ICD-10-CM | POA: Diagnosis not present

## 2016-04-23 NOTE — ED Notes (Signed)
No answer in lobby.

## 2016-04-23 NOTE — ED Triage Notes (Signed)
Patient reports of insect bite x 2 weeks ago to right groin.

## 2016-04-25 ENCOUNTER — Emergency Department (HOSPITAL_COMMUNITY)
Admission: EM | Admit: 2016-04-25 | Discharge: 2016-04-25 | Disposition: A | Discharge status: Home / Self Care | Payer: Medicare Other | Attending: Emergency Medicine | Admitting: Emergency Medicine

## 2016-04-25 ENCOUNTER — Encounter (HOSPITAL_COMMUNITY): Payer: Self-pay | Admitting: *Deleted

## 2016-04-25 DIAGNOSIS — S61231A Puncture wound without foreign body of left index finger without damage to nail, initial encounter: Secondary | ICD-10-CM | POA: Insufficient documentation

## 2016-04-25 DIAGNOSIS — S61239A Puncture wound without foreign body of unspecified finger without damage to nail, initial encounter: Secondary | ICD-10-CM

## 2016-04-25 DIAGNOSIS — Y929 Unspecified place or not applicable: Secondary | ICD-10-CM | POA: Insufficient documentation

## 2016-04-25 DIAGNOSIS — I10 Essential (primary) hypertension: Secondary | ICD-10-CM | POA: Insufficient documentation

## 2016-04-25 DIAGNOSIS — Y288XXA Contact with other sharp object, undetermined intent, initial encounter: Secondary | ICD-10-CM | POA: Diagnosis not present

## 2016-04-25 DIAGNOSIS — Y939 Activity, unspecified: Secondary | ICD-10-CM | POA: Diagnosis not present

## 2016-04-25 DIAGNOSIS — Y999 Unspecified external cause status: Secondary | ICD-10-CM | POA: Insufficient documentation

## 2016-04-25 DIAGNOSIS — S6992XA Unspecified injury of left wrist, hand and finger(s), initial encounter: Secondary | ICD-10-CM | POA: Diagnosis present

## 2016-04-25 DIAGNOSIS — L089 Local infection of the skin and subcutaneous tissue, unspecified: Secondary | ICD-10-CM

## 2016-04-25 DIAGNOSIS — E119 Type 2 diabetes mellitus without complications: Secondary | ICD-10-CM | POA: Insufficient documentation

## 2016-04-25 MED ORDER — MUPIROCIN CALCIUM 2 % NA OINT: TOPICAL_OINTMENT | NASAL | 1 refills | Status: DC

## 2016-04-25 MED ORDER — DOXYCYCLINE HYCLATE 100 MG PO CAPS: 100.0000 mg | ORAL_CAPSULE | Freq: Two times a day (BID) | ORAL | 0 refills | Status: DC

## 2016-04-25 NOTE — Discharge Instructions (Signed)
Please soak the area of your right side, and the left finger in warm Epsom salt water daily for about 15 minutes. Please apply Bactroban to the area of your right side daily until wound heals. Please use doxycycline 2 times daily until all taken. Please have Dr. Selena Batten monitor your finger closely due to a previous puncture wound. Return to the emergency department sooner if any emergent changes, problems, or concerns.

## 2016-04-25 NOTE — ED Notes (Signed)
Pt made aware to return if symptoms worsen or if any life threatening symptoms occur.   

## 2016-04-25 NOTE — ED Notes (Signed)
Ivery Quale, PA at bedside.

## 2016-04-25 NOTE — ED Provider Notes (Signed)
[facility] Provider Note   CSN: [id number] Arrival date & time: 04/25/16  1224     History   Chief Complaint Chief Complaint  Patient presents with  . Finger Injury    HPI Regina Patton is a 45 y.o. female.  Patient is a 45 year old female who presents to the emergency department with a complaint of an infection of her left index finger, and an infection of her right lower abdomen.  The patient states that she felt an irritation in the skin fold of her right lower abdomen. She was attempting to put medication on it, but she feels as though she may have "ruptured the area". She says now there is a raw area there and what feels hard underneath the skin. She says it is tender with certain movements and with palpation. She has not had any high fever reported. She has had some drainage from this area. She has not noted any red streaking. She states that she is diabetic, and she is concerned for increasing infection.  The patient states that she accidentally stuck her index finger with an object a few days ago. She says she washes out with peroxide, but it is red and swollen and she began his concern for infection given her diabetic status. She requested this area be evaluated as well.      Past Medical History:  Diagnosis Date  . C. difficile diarrhea   . Cervical radiculopathy   . Chronic abdominal pain   . Chronic back pain   . Chronic knee pain   . Chronic neck pain   . Diabetes mellitus without complication (HCC)    diet controlled  . Fibromyalgia   . Hypertension   . Rheumatoid arthritis (HCC)    "Rhematoid factor positive but no symptoms" per EPIC notes 2012  . Rib pain on right side    chronic  . Sciatic pain    right    Patient Active Problem List   Diagnosis Date Noted  . Cholecystitis with cholelithiasis 10/17/2015  . C. difficile colitis 04/19/2015  . Essential hypertension 04/05/2015  . Chronic pain syndrome 04/05/2015    Past Surgical  History:  Procedure Laterality Date  . BLADDER SURGERY    . CHOLECYSTECTOMY N/A 10/18/2015   Procedure: LAPAROSCOPIC CHOLECYSTECTOMY;  Surgeon: Franky Macho, MD;  Location: [facility];  Service: General;  Laterality: N/A;  . TUBAL LIGATION      OB History    Gravida Para Term Preterm AB Living   3 3 3     3    SAB TAB Ectopic Multiple Live Births                   Home Medications    Prior to Admission medications   Not on File    Family History Family History  Problem Relation Age of Onset  . Cancer Mother   . Diabetes Mother   . Colon cancer Father     AFTER AGE 47  . Crohn's disease Sister     Social History Social History  Substance Use Topics  . Smoking status: Never Smoker  . Smokeless tobacco: Never Used  . Alcohol use No     Allergies   Mango flavor; Penicillins; Tramadol; Zofran [ondansetron hcl]; Bactrim [sulfamethoxazole-trimethoprim]; Keflex [cephalexin]; and Toradol [ketorolac tromethamine]   Review of Systems Review of Systems  Musculoskeletal: Positive for arthralgias, back pain and neck pain.  Skin: Positive for wound.  All other systems reviewed and are negative.  Physical Exam Updated Vital Signs BP 126/84 (BP Location: Right Arm)   Pulse (!) 57   Temp 98.5 F (36.9 C) (Oral)   Resp 16   Ht 5' (1.524 m)   Wt 81.6 kg   LMP 03/16/2016   SpO2 100%   BMI 35.15 kg/m   Physical Exam  Constitutional: She is oriented to person, place, and time. She appears well-developed and well-nourished.  Non-toxic appearance.  HENT:  Head: Normocephalic.  Right Ear: Tympanic membrane and external ear normal.  Left Ear: Tympanic membrane and external ear normal.  Eyes: EOM and lids are normal. Pupils are equal, round, and reactive to light.  Neck: Normal range of motion. Neck supple. Carotid bruit is not present.  Cardiovascular: Normal rate, regular rhythm, normal heart sounds, intact distal pulses and normal pulses.   Pulmonary/Chest: Breath  sounds normal. No respiratory distress.  Abdominal: Soft. Bowel sounds are normal. There is no tenderness. There is no guarding.  There is an area of increased redness at the right lower lateral abdomen with and an abdominal skin fold. There is mild drainage present. There is a denuded area on in the center of the irritated red area. There is no red streaking appreciated. The area is tender to touch on. It is warm, but not hot.  Musculoskeletal:  There is a blood blister near the DIP area of the left index finger. There is mild swelling of the distal index finger. There is no red streaking noted up the hand on. The patient has some flexion, but not complete flexion at the DIP. There is full range of motion of the other fingers. There is full range of motion of the left wrist, elbow, and shoulder.  Lymphadenopathy:       Head (right side): No submandibular adenopathy present.       Head (left side): No submandibular adenopathy present.    She has no cervical adenopathy.  Neurological: She is alert and oriented to person, place, and time. She has normal strength. No cranial nerve deficit or sensory deficit.  Skin: Skin is warm and dry.  Psychiatric: She has a normal mood and affect. Her speech is normal.  Nursing note and vitals reviewed.    ED Treatments / Results  Labs (all labs ordered are listed, but only abnormal results are displayed) Labs Reviewed - No data to display  EKG  EKG Interpretation None       Radiology No results found.  Procedures Procedures (including critical care time)  Medications Ordered in ED Medications - No data to display   Initial Impression / Assessment and Plan / ED Course  I have reviewed the triage vital signs and the nursing notes.  Pertinent labs & imaging results that were available during my care of the patient were reviewed by me and considered in my medical decision making (see chart for details).  Clinical Course    *I have reviewed  nursing notes, vital signs, and all appropriate lab and imaging results for this patient.**  Final Clinical Impressions(s) / ED Diagnoses  Vital signs within normal limits. I've advised the patient to use Bactroban to the denuded area of the skin fold of the right lower abdomen. She will use doxycycline for the infected area of the finger and well as the skin fold area. I've asked the patient to use warm tub soaks to both areas daily. Patient is to follow with the primary physician, or return to the emergency department if not improving.  Final diagnoses:  None    New Prescriptions New Prescriptions   No medications on file     Ivery Quale, PA-C 04/25/16 1435    Samuel Jester, DO 04/25/16 1542

## 2016-04-25 NOTE — ED Triage Notes (Signed)
Pt states she was trying to get a container open a few days ago and stabbed her left index finger. She noticed swelling and tenderness in that finger today.   In addition, pt has insect bite to her right hip area that she wants looked at. Area is red and inflammmed.

## 2016-05-18 ENCOUNTER — Emergency Department (HOSPITAL_COMMUNITY)
Admission: EM | Admit: 2016-05-18 | Discharge: 2016-05-18 | Disposition: A | Discharge status: Home / Self Care | Payer: Medicare Other | Attending: Emergency Medicine | Admitting: Emergency Medicine

## 2016-05-18 ENCOUNTER — Encounter (HOSPITAL_COMMUNITY): Payer: Self-pay

## 2016-05-18 DIAGNOSIS — M549 Dorsalgia, unspecified: Secondary | ICD-10-CM | POA: Diagnosis not present

## 2016-05-18 DIAGNOSIS — K029 Dental caries, unspecified: Secondary | ICD-10-CM | POA: Diagnosis not present

## 2016-05-18 DIAGNOSIS — I10 Essential (primary) hypertension: Secondary | ICD-10-CM | POA: Diagnosis not present

## 2016-05-18 DIAGNOSIS — E119 Type 2 diabetes mellitus without complications: Secondary | ICD-10-CM | POA: Diagnosis not present

## 2016-05-18 DIAGNOSIS — K0889 Other specified disorders of teeth and supporting structures: Secondary | ICD-10-CM | POA: Diagnosis present

## 2016-05-18 DIAGNOSIS — M542 Cervicalgia: Secondary | ICD-10-CM | POA: Insufficient documentation

## 2016-05-18 MED ORDER — CLINDAMYCIN HCL 150 MG PO CAPS: 150.0000 mg | ORAL_CAPSULE | Freq: Four times a day (QID) | ORAL | 0 refills | Status: DC

## 2016-05-18 MED ORDER — ACETAMINOPHEN-CODEINE #3 300-30 MG PO TABS
2.0000 | ORAL_TABLET | Freq: Once | ORAL | Status: AC
  Administered 2016-05-18: 2 via ORAL
  Filled 2016-05-18: qty 2

## 2016-05-18 MED ORDER — ACETAMINOPHEN-CODEINE #3 300-30 MG PO TABS: 1.0000 | ORAL_TABLET | Freq: Four times a day (QID) | ORAL | 0 refills | Status: DC | PRN

## 2016-05-18 MED ORDER — PROMETHAZINE HCL 12.5 MG PO TABS
12.5000 mg | ORAL_TABLET | Freq: Once | ORAL | Status: AC
  Administered 2016-05-18: 12.5 mg via ORAL
  Filled 2016-05-18: qty 1

## 2016-05-18 MED ORDER — CLINDAMYCIN HCL 150 MG PO CAPS
300.0000 mg | ORAL_CAPSULE | Freq: Once | ORAL | Status: AC
  Administered 2016-05-18: 300 mg via ORAL
  Filled 2016-05-18: qty 2

## 2016-05-18 NOTE — ED Triage Notes (Signed)
C/o dental pain to right lower mouth started this morning.

## 2016-05-18 NOTE — Discharge Instructions (Signed)
Please keep a close check on your diabetes status until this infection is resolved by your dentist. It is important that she see a dentist as soon as possible. Please see Dr. Selena Batten or member of his team for pain management. Use clindamycin with breakfast, lunch, dinner, and at bedtime until all taken. Use Tylenol or ibuprofen for mild pain, use Tylenol codeine for more severe pain.This medication may cause drowsiness. Please do not drink, drive, or participate in activity that requires concentration while taking this medication.

## 2016-05-18 NOTE — ED Provider Notes (Signed)
[facility] Provider Note   CSN: [id number] Arrival date & time: 05/18/16  2152     History   Chief Complaint Chief Complaint  Patient presents with  . Dental Pain    HPI Regina Patton is a 45 y.o. female.  Patient is a 45 year old female who presents to the emergency department with complaint of dental pain.  Patient stated that this morning he noted pain at the right lower jaw. He's been having increasing pain since that time. He is also noted some swelling about the jaw. She has not had fever or chills reported. There has been no nausea or vomiting. No difficulty with speaking. No recent injury or trauma to the mouth.   The history is provided by the patient.  Dental Pain      Past Medical History:  Diagnosis Date  . C. difficile diarrhea   . Cervical radiculopathy   . Chronic abdominal pain   . Chronic back pain   . Chronic knee pain   . Chronic neck pain   . Diabetes mellitus without complication (HCC)    diet controlled  . Fibromyalgia   . Hypertension   . Rheumatoid arthritis (HCC)    "Rhematoid factor positive but no symptoms" per EPIC notes 2012  . Rib pain on right side    chronic  . Sciatic pain    right    Patient Active Problem List   Diagnosis Date Noted  . Cholecystitis with cholelithiasis 10/17/2015  . C. difficile colitis 04/19/2015  . Essential hypertension 04/05/2015  . Chronic pain syndrome 04/05/2015    Past Surgical History:  Procedure Laterality Date  . BLADDER SURGERY    . CHOLECYSTECTOMY N/A 10/18/2015   Procedure: LAPAROSCOPIC CHOLECYSTECTOMY;  Surgeon: Franky Macho, MD;  Location: [facility];  Service: General;  Laterality: N/A;  . TUBAL LIGATION      OB History    Gravida Para Term Preterm AB Living   3 3 3     3    SAB TAB Ectopic Multiple Live Births                   Home Medications    Prior to Admission medications   Medication Sig Start Date End Date Taking? Authorizing Provider  doxycycline  (VIBRAMYCIN) 100 MG capsule Take 1 capsule (100 mg total) by mouth 2 (two) times daily. 04/25/16   04/27/16, PA-C  mupirocin nasal ointment (BACTROBAN) 2 % Please apply to the wound of the lower abdomen 2 times daily until healed. 04/25/16   04/27/16, PA-C    Family History Family History  Problem Relation Age of Onset  . Cancer Mother   . Diabetes Mother   . Colon cancer Father     AFTER AGE 32  . Crohn's disease Sister     Social History Social History  Substance Use Topics  . Smoking status: Never Smoker  . Smokeless tobacco: Never Used  . Alcohol use No     Allergies   Mango flavor; Penicillins; Tramadol; Zofran [ondansetron hcl]; Bactrim [sulfamethoxazole-trimethoprim]; Keflex [cephalexin]; and Toradol [ketorolac tromethamine]   Review of Systems Review of Systems  HENT: Positive for dental problem.   Musculoskeletal: Positive for arthralgias, back pain and neck pain.  All other systems reviewed and are negative.    Physical Exam Updated Vital Signs BP 137/84 (BP Location: Left Arm)   Pulse 78   Temp 98.3 F (36.8 C) (Oral)   Resp 16   Ht 5' (1.524 m)  Wt 81.2 kg   LMP 05/15/2016   SpO2 98%   BMI 34.96 kg/m   Physical Exam  Constitutional: She is oriented to person, place, and time. She appears well-developed and well-nourished.  Non-toxic appearance.  HENT:  Head: Normocephalic.  Right Ear: Tympanic membrane and external ear normal.  Left Ear: Tympanic membrane and external ear normal.  There is swelling of the right lower gum and jaw. There is tenderness to palpation of the teeth of the lower jaw. No swelling under the tongue appreciated. The airway is patent.  Eyes: EOM and lids are normal. Pupils are equal, round, and reactive to light.  Neck: Normal range of motion. Neck supple. Carotid bruit is not present.  There is tenderness in the right submental area extending down the mid right neck on. Few palpable nodes appreciated.    Cardiovascular: Normal rate, regular rhythm, normal heart sounds, intact distal pulses and normal pulses.   Pulmonary/Chest: Breath sounds normal. No respiratory distress.  Abdominal: Soft. Bowel sounds are normal. There is no tenderness. There is no guarding.  Musculoskeletal: Normal range of motion.  Lymphadenopathy:       Head (right side): No submandibular adenopathy present.       Head (left side): No submandibular adenopathy present.    She has no cervical adenopathy.  Neurological: She is alert and oriented to person, place, and time. She has normal strength. No cranial nerve deficit or sensory deficit.  Skin: Skin is warm and dry.  Psychiatric: She has a normal mood and affect. Her speech is normal.  Nursing note and vitals reviewed.    ED Treatments / Results  Labs (all labs ordered are listed, but only abnormal results are displayed) Labs Reviewed - No data to display  EKG  EKG Interpretation None       Radiology No results found.  Procedures Procedures (including critical care time)  Medications Ordered in ED Medications - No data to display   Initial Impression / Assessment and Plan / ED Course  I have reviewed the triage vital signs and the nursing notes.  Pertinent labs & imaging results that were available during my care of the patient were reviewed by me and considered in my medical decision making (see chart for details).  Clinical Course    **I have reviewed nursing notes, vital signs, and all appropriate lab and imaging results for this patient.*  Final Clinical Impressions(s) / ED Diagnoses  Patient has dental pain and dental caries on the right. There is some swelling present. Patient was treated with clindamycin in the emergency department, and prescription for clindamycin was given to the patient. The patient has several allergies. She will be treated with Tylenol and Tylenol codeine for pain. I strongly suggested that the patient see a  dentist systems possible, especially since she is diabetic and having swelling and pain related to dental caries. The patient knowledge is understanding of my instructions, and is in agreement.    Final diagnoses:  Dental caries    New Prescriptions New Prescriptions   No medications on file     Ivery Quale, PA-C 05/18/16 2245    Bethann Berkshire, MD 05/19/16 2318

## 2016-05-19 ENCOUNTER — Encounter (HOSPITAL_COMMUNITY): Payer: Self-pay | Admitting: Emergency Medicine

## 2016-05-19 ENCOUNTER — Emergency Department (HOSPITAL_COMMUNITY)
Admission: EM | Admit: 2016-05-19 | Discharge: 2016-05-20 | Disposition: A | Discharge status: Home / Self Care | Payer: Medicare Other | Attending: Emergency Medicine | Admitting: Emergency Medicine

## 2016-05-19 DIAGNOSIS — I10 Essential (primary) hypertension: Secondary | ICD-10-CM | POA: Diagnosis not present

## 2016-05-19 DIAGNOSIS — Z79899 Other long term (current) drug therapy: Secondary | ICD-10-CM | POA: Diagnosis not present

## 2016-05-19 DIAGNOSIS — Z792 Long term (current) use of antibiotics: Secondary | ICD-10-CM | POA: Insufficient documentation

## 2016-05-19 DIAGNOSIS — R55 Syncope and collapse: Secondary | ICD-10-CM | POA: Diagnosis present

## 2016-05-19 DIAGNOSIS — E119 Type 2 diabetes mellitus without complications: Secondary | ICD-10-CM | POA: Diagnosis not present

## 2016-05-19 DIAGNOSIS — K047 Periapical abscess without sinus: Secondary | ICD-10-CM

## 2016-05-19 LAB — CBG MONITORING, ED: GLUCOSE-CAPILLARY: 98 mg/dL (ref 65–99)

## 2016-05-19 LAB — RAPID URINE DRUG SCREEN, HOSP PERFORMED
AMPHETAMINES: NOT DETECTED
BENZODIAZEPINES: NOT DETECTED
Barbiturates: NOT DETECTED
COCAINE: NOT DETECTED
OPIATES: POSITIVE — AB
Tetrahydrocannabinol: POSITIVE — AB

## 2016-05-19 LAB — HEPATIC FUNCTION PANEL
ALK PHOS: 45 U/L (ref 38–126)
ALT: 61 U/L — ABNORMAL HIGH (ref 14–54)
AST: 157 U/L — ABNORMAL HIGH (ref 15–41)
Albumin: 4.1 g/dL (ref 3.5–5.0)
BILIRUBIN INDIRECT: 0.6 mg/dL (ref 0.3–0.9)
Bilirubin, Direct: 0.4 mg/dL (ref 0.1–0.5)
TOTAL PROTEIN: 7.6 g/dL (ref 6.5–8.1)
Total Bilirubin: 1 mg/dL (ref 0.3–1.2)

## 2016-05-19 LAB — URINALYSIS, ROUTINE W REFLEX MICROSCOPIC
Bilirubin Urine: NEGATIVE
Glucose, UA: NEGATIVE mg/dL
Hgb urine dipstick: NEGATIVE
LEUKOCYTES UA: NEGATIVE
NITRITE: NEGATIVE
PH: 6.5 (ref 5.0–8.0)
Protein, ur: NEGATIVE mg/dL
SPECIFIC GRAVITY, URINE: 1.02 (ref 1.005–1.030)

## 2016-05-19 LAB — BASIC METABOLIC PANEL
ANION GAP: 8 (ref 5–15)
BUN: 7 mg/dL (ref 6–20)
CALCIUM: 9 mg/dL (ref 8.9–10.3)
CO2: 25 mmol/L (ref 22–32)
Chloride: 104 mmol/L (ref 101–111)
Creatinine, Ser: 0.74 mg/dL (ref 0.44–1.00)
Glucose, Bld: 98 mg/dL (ref 65–99)
POTASSIUM: 3.4 mmol/L — AB (ref 3.5–5.1)
Sodium: 137 mmol/L (ref 135–145)

## 2016-05-19 LAB — CBC
HEMATOCRIT: 39.8 % (ref 36.0–46.0)
HEMOGLOBIN: 13 g/dL (ref 12.0–15.0)
MCH: 27.6 pg (ref 26.0–34.0)
MCHC: 32.7 g/dL (ref 30.0–36.0)
MCV: 84.5 fL (ref 78.0–100.0)
Platelets: 329 10*3/uL (ref 150–400)
RBC: 4.71 MIL/uL (ref 3.87–5.11)
RDW: 16.3 % — ABNORMAL HIGH (ref 11.5–15.5)
WBC: 9.2 10*3/uL (ref 4.0–10.5)

## 2016-05-19 LAB — PREGNANCY, URINE: PREG TEST UR: NEGATIVE

## 2016-05-19 LAB — TROPONIN I

## 2016-05-19 LAB — CK: CK TOTAL: 59 U/L (ref 38–234)

## 2016-05-19 MED ORDER — ACETAMINOPHEN 500 MG PO TABS
1000.0000 mg | ORAL_TABLET | Freq: Once | ORAL | Status: AC
  Administered 2016-05-19: 1000 mg via ORAL
  Filled 2016-05-19: qty 2

## 2016-05-19 MED ORDER — SODIUM CHLORIDE 0.9 % IV BOLUS (SEPSIS)
1000.0000 mL | Freq: Once | INTRAVENOUS | Status: AC
  Administered 2016-05-19: 1000 mL via INTRAVENOUS

## 2016-05-19 NOTE — ED Provider Notes (Signed)
TIME SEEN: 11:32 PM  CHIEF COMPLAINT: Syncope  HPI:HPI Comments:  Regina Patton is a 45 y.o. female with chronic pain, hypertension, diabetes who is well known to our emergency department who presents to the Emergency Department complaining of LOC earlier today.  She states that her boyfriend found her lying on the bathroom floor.  She also notes she was seen in the ED yesterday for a dental abscess and given clindamycin and Tylenol 3.  She denies chest pain, SOB, vomiting, diarrhea, melena, blood in her stools, bowel or bladder incontinence, numbness, tingling, focal weakness, headache, tongue biting and history of seizures.  Denies any preceding symptoms before she passed out other than lightheadedness. States she took her Tylenol 3 and clindamycin before she passed out. Episode was unwitnessed.  ROS: See HPI Constitutional: no fever  Eyes: no drainage  ENT: no runny nose   Cardiovascular:  no chest pain  Resp: no SOB  GI: no vomiting GU: no dysuria Integumentary: no rash  Allergy: no hives  Musculoskeletal: no leg swelling  Neurological: no slurred speech ROS otherwise negative  PAST MEDICAL HISTORY/PAST SURGICAL HISTORY:  Past Medical History:  Diagnosis Date  . C. difficile diarrhea   . Cervical radiculopathy   . Chronic abdominal pain   . Chronic back pain   . Chronic knee pain   . Chronic neck pain   . Diabetes mellitus without complication (HCC)    diet controlled  . Fibromyalgia   . Hypertension   . Rheumatoid arthritis (HCC)    "Rhematoid factor positive but no symptoms" per EPIC notes 2012  . Rib pain on right side    chronic  . Sciatic pain    right    MEDICATIONS:  Prior to Admission medications   Medication Sig Start Date End Date Taking? Authorizing Provider  acetaminophen-codeine (TYLENOL #3) 300-30 MG tablet Take 1-2 tablets by mouth every 6 (six) hours as needed for moderate pain. 05/18/16  Yes Ivery Quale, PA-C  clindamycin (CLEOCIN) 150 MG capsule  Take 1 capsule (150 mg total) by mouth every 6 (six) hours. 05/18/16  Yes Ivery Quale, PA-C  doxycycline (VIBRAMYCIN) 100 MG capsule Take 1 capsule (100 mg total) by mouth 2 (two) times daily. Patient not taking: Reported on 05/19/2016 04/25/16   Ivery Quale, PA-C  mupirocin nasal ointment (BACTROBAN) 2 % Please apply to the wound of the lower abdomen 2 times daily until healed. Patient not taking: Reported on 05/19/2016 04/25/16   Ivery Quale, PA-C    ALLERGIES:  Allergies  Allergen Reactions  . Mango Flavor Anaphylaxis and Swelling    Lips swelling  . Penicillins Anaphylaxis and Hives    Has patient had a PCN reaction causing immediate rash, facial/tongue/throat swelling, SOB or lightheadedness with hypotension: Yes Has patient had a PCN reaction causing severe rash involving mucus membranes or skin necrosis: No Has patient had a PCN reaction that required hospitalization No Has patient had a PCN reaction occurring within the last 10 years: No If all of the above answers are "NO", then may proceed with Cephalosporin use.   . Tramadol Nausea And Vomiting  . Zofran [Ondansetron Hcl] Other (See Comments)    headache  . Bactrim [Sulfamethoxazole-Trimethoprim] Itching and Rash  . Keflex [Cephalexin] Hives, Itching and Rash    Patient reports 04/04/16 that she has taken this medication several times in the past and has only developed "itching" but denies rash, hives, anaphylactic reaction, angioedema.  . Toradol [Ketorolac Tromethamine] Rash    SOCIAL  HISTORY:  Social History  Substance Use Topics  . Smoking status: Never Smoker  . Smokeless tobacco: Never Used  . Alcohol use No    FAMILY HISTORY: Family History  Problem Relation Age of Onset  . Cancer Mother   . Diabetes Mother   . Colon cancer Father     AFTER AGE 13  . Crohn's disease Sister     EXAM: BP 133/86   Pulse 71   Temp 98.9 F (37.2 C) (Oral)   Resp 20   Ht 5\' 5"  (1.651 m)   Wt 179 lb (81.2 kg)   LMP  05/15/2016   SpO2 99%   BMI 29.79 kg/m  CONSTITUTIONAL: Alert and oriented and responds appropriately to questions. Well-appearing; well-nourished, Afebrile, nontoxic HEAD: Normocephalic, atraumatic EYES: Conjunctivae clear, PERRL ENT: normal nose; no rhinorrhea; moist mucous membranes; No pharyngeal erythema or petechiae, no tonsillar hypertrophy or exudate, no uvular deviation, no trismus or drooling, normal phonation, no stridor, dental caries present, no drainable dental abscess noted, no Ludwig's angina, tongue sits flat in the bottom of the mouth, no angioedema, no facial erythema or warmth, minimal facial swelling noted to the right mandible NECK: Supple, no meningismus, no LAD, no midline spine tenderness or step-off or deformity  CARD: RRR; S1 and S2 appreciated; no murmurs, no clicks, no rubs, no gallops RESP: Normal chest excursion without splinting or tachypnea; breath sounds clear and equal bilaterally; no wheezes, no rhonchi, no rales, no hypoxia or respiratory distress, speaking full sentences ABD/GI: Normal bowel sounds; non-distended; soft, non-tender, no rebound, no guarding, no peritoneal signs BACK:  The back appears normal and is non-tender to palpation, there is no CVA tenderness, no midline spinal tenderness or step-off or deformity EXT: Normal ROM in all joints; non-tender to palpation; no edema; normal capillary refill; no cyanosis, no calf tenderness or swelling , 2+ DP and radial pulses in bilateral upper and lower extremities   SKIN: Normal color for age and race; warm; no rash NEURO: Moves all extremities equally, sensation to light touch intact diffusely, cranial nerves II through XII intact, normal gait PSYCH: The patient's mood and manner are appropriate. Grooming and personal hygiene are appropriate.  MEDICAL DECISION MAKING: Patient here with syncopal event. States she felt lightheaded prior to passing out. No other preceding symptoms. No history of ACS, PE. She is  neurologically intact. No complaints of headache. No sign of trauma on exam.  Orthostatics are negative. Will give IV fluids. Suspect this could've been from her Tylenol 3. EKG shows no ischemic abnormality, arrhythmia. Labs, urine pending.  ED PROGRESS: Patient's labs are unremarkable other than mildly elevated AST and ALT. No abdominal pain on exam. No fevers, vomiting or diarrhea. Troponin is negative. Urine pregnancy test negative. Urine drug positive for THC and opiates. Have advised her to have her LFTs rechecked by her PCP. Have advised her to avoid Tylenol as well as alcohol. Have advised her not to take the Tylenol 3 that she was prescribed yesterday. She is asymptomatic currently. I feel she is safe to be discharged home with outpatient follow-up.   At this time, I do not feel there is any life-threatening condition present. I have reviewed and discussed all results (EKG, imaging, lab, urine as appropriate), exam findings with patient/family. I have reviewed nursing notes and appropriate previous records.  I feel the patient is safe to be discharged home without further emergent workup and can continue workup as an outpatient as needed. Discussed usual and customary return precautions.  Patient/family verbalize understanding and are comfortable with this plan.  Outpatient follow-up has been provided. All questions have been answered.      EKG Interpretation  Date/Time:  Tuesday May 19 2016 21:49:55 EDT Ventricular Rate:  68 PR Interval:  148 QRS Duration: 84 QT Interval:  420 QTC Calculation: 446 R Axis:   -9 Text Interpretation:  Normal sinus rhythm Minimal voltage criteria for LVH, may be normal variant Cannot rule out Anterior infarct , age undetermined Abnormal ECG Confirmed by [facility] MD, JASON [phone]) on 05/19/2016 10:33:20 PM         Layla Maw Ellawyn Wogan, DO 05/20/16 0002

## 2016-05-19 NOTE — ED Triage Notes (Signed)
Pt was found in floor by boyfriend this evening. When ems arrived pt was alert and oriented. Pt was seen yesterday for dental pain. Blood sugar was 99.

## 2016-05-20 DIAGNOSIS — R55 Syncope and collapse: Secondary | ICD-10-CM | POA: Diagnosis not present

## 2016-07-05 ENCOUNTER — Encounter (HOSPITAL_COMMUNITY): Payer: Self-pay | Admitting: Emergency Medicine

## 2016-07-05 ENCOUNTER — Emergency Department (HOSPITAL_COMMUNITY)
Admission: EM | Admit: 2016-07-05 | Discharge: 2016-07-05 | Discharge status: Against Medical Advice | Payer: Medicare Other | Attending: Emergency Medicine | Admitting: Emergency Medicine

## 2016-07-05 DIAGNOSIS — Z79899 Other long term (current) drug therapy: Secondary | ICD-10-CM | POA: Insufficient documentation

## 2016-07-05 DIAGNOSIS — E119 Type 2 diabetes mellitus without complications: Secondary | ICD-10-CM | POA: Insufficient documentation

## 2016-07-05 DIAGNOSIS — R1033 Periumbilical pain: Secondary | ICD-10-CM

## 2016-07-05 DIAGNOSIS — I10 Essential (primary) hypertension: Secondary | ICD-10-CM | POA: Insufficient documentation

## 2016-07-05 DIAGNOSIS — R112 Nausea with vomiting, unspecified: Secondary | ICD-10-CM

## 2016-07-05 DIAGNOSIS — R197 Diarrhea, unspecified: Secondary | ICD-10-CM | POA: Insufficient documentation

## 2016-07-05 LAB — COMPREHENSIVE METABOLIC PANEL
ALT: 10 U/L — ABNORMAL LOW (ref 14–54)
AST: 14 U/L — ABNORMAL LOW (ref 15–41)
Albumin: 3.9 g/dL (ref 3.5–5.0)
Alkaline Phosphatase: 28 U/L — ABNORMAL LOW (ref 38–126)
Anion gap: 6 (ref 5–15)
BUN: 8 mg/dL (ref 6–20)
CHLORIDE: 107 mmol/L (ref 101–111)
CO2: 25 mmol/L (ref 22–32)
Calcium: 9.1 mg/dL (ref 8.9–10.3)
Creatinine, Ser: 0.72 mg/dL (ref 0.44–1.00)
Glucose, Bld: 104 mg/dL — ABNORMAL HIGH (ref 65–99)
POTASSIUM: 3.4 mmol/L — AB (ref 3.5–5.1)
Sodium: 138 mmol/L (ref 135–145)
TOTAL PROTEIN: 7.4 g/dL (ref 6.5–8.1)
Total Bilirubin: 0.4 mg/dL (ref 0.3–1.2)

## 2016-07-05 LAB — PREGNANCY, URINE: Preg Test, Ur: NEGATIVE

## 2016-07-05 LAB — LIPASE, BLOOD: LIPASE: 36 U/L (ref 11–51)

## 2016-07-05 LAB — CBC
HEMATOCRIT: 39.3 % (ref 36.0–46.0)
Hemoglobin: 13.7 g/dL (ref 12.0–15.0)
MCH: 29.7 pg (ref 26.0–34.0)
MCHC: 34.9 g/dL (ref 30.0–36.0)
MCV: 85.2 fL (ref 78.0–100.0)
PLATELETS: 319 10*3/uL (ref 150–400)
RBC: 4.61 MIL/uL (ref 3.87–5.11)
RDW: 15.5 % (ref 11.5–15.5)
WBC: 8.4 10*3/uL (ref 4.0–10.5)

## 2016-07-05 LAB — URINE MICROSCOPIC-ADD ON

## 2016-07-05 LAB — URINALYSIS, ROUTINE W REFLEX MICROSCOPIC
Bilirubin Urine: NEGATIVE
GLUCOSE, UA: NEGATIVE mg/dL
Ketones, ur: NEGATIVE mg/dL
NITRITE: NEGATIVE
PH: 5.5 (ref 5.0–8.0)
PROTEIN: NEGATIVE mg/dL
Specific Gravity, Urine: >1.03 — ABNORMAL HIGH (ref 1.005–1.030)

## 2016-07-05 MED ORDER — SODIUM CHLORIDE 0.9 % IV SOLN
1000.0000 mL | INTRAVENOUS | Status: DC
  Administered 2016-07-05: 1000 mL via INTRAVENOUS

## 2016-07-05 MED ORDER — PROCHLORPERAZINE EDISYLATE 5 MG/ML IJ SOLN
10.0000 mg | Freq: Once | INTRAMUSCULAR | Status: AC
  Administered 2016-07-05: 10 mg via INTRAVENOUS
  Filled 2016-07-05: qty 2

## 2016-07-05 MED ORDER — SODIUM CHLORIDE 0.9 % IV SOLN
1000.0000 mL | Freq: Once | INTRAVENOUS | Status: AC
  Administered 2016-07-05: 1000 mL via INTRAVENOUS

## 2016-07-05 NOTE — ED Notes (Signed)
Pt left AMA °

## 2016-07-05 NOTE — ED Triage Notes (Signed)
Patient c/o generalized abd pain with nausea, vomiting, and diarrhea. Denies any fevers or urinary symptoms. Per patient PCP has placed her on medication for diarrhea (unsure of name) in which she took last night with no relief.

## 2016-07-05 NOTE — ED Provider Notes (Signed)
[facility] Provider Note   CSN: [id number] Arrival date & time: 07/05/16  1336  By signing my name below, I, Christy Sartorius, attest that this documentation has been prepared under the direction and in the presence of  Ivery Quale, PA-C. Electronically Signed: Christy Sartorius, ED Scribe. 07/05/16. 2:28 PM.  History   Chief Complaint Chief Complaint  Patient presents with  . Abdominal Pain   The history is provided by the patient and medical records. No language interpreter was used.    HPI Comments:  Regina Patton is a 45 y.o. female who presents to the Emergency Department complaining of vomiting onset 0400 this morning, acute on chronic diarrhea onset 0300 and abdominal pain.  Pt has vomited 4 times and had 5-6 episodes of diarrhea since this morning.  She has not made it to the restroom twice and notes her stool is very watery and fowl smelling.  Pt states her abdominal pain begins in the periumbilical area and radiates around her abdomen; she describes it as cramping and sharp.  Pt has had sharp abdominal pain in the past but reports she never had cramping with it.  Pt adds that she has had chronic diarrhea for 6 months and was recently put on cholestyramine by Dr. Selena Batten which she feels has made her diarrhea worse.  Pt has history of C-difficile diarrhea and was last tested for it a year ago.  She was treated for C-Diff by Dr. Darrick Penna.  Pt was on antibiotics a month ago for an abscess.   She denies blood in her stool, fever, dysuria, hematuria, hematemesis, sore throat, congestion and rhinorrhea.  Past Medical History:  Diagnosis Date  . C. difficile diarrhea   . Cervical radiculopathy   . Chronic abdominal pain   . Chronic back pain   . Chronic knee pain   . Chronic neck pain   . Diabetes mellitus without complication (HCC)    diet controlled  . Fibromyalgia   . Hypertension   . Rheumatoid arthritis (HCC)    "Rhematoid factor positive but no symptoms" per EPIC  notes 2012  . Rib pain on right side    chronic  . Sciatic pain    right    Patient Active Problem List   Diagnosis Date Noted  . Cholecystitis with cholelithiasis 10/17/2015  . C. difficile colitis 04/19/2015  . Essential hypertension 04/05/2015  . Chronic pain syndrome 04/05/2015    Past Surgical History:  Procedure Laterality Date  . BLADDER SURGERY    . CHOLECYSTECTOMY N/A 10/18/2015   Procedure: LAPAROSCOPIC CHOLECYSTECTOMY;  Surgeon: Franky Macho, MD;  Location: [facility];  Service: General;  Laterality: N/A;  . TUBAL LIGATION      OB History    Gravida Para Term Preterm AB Living   3 3 3     3    SAB TAB Ectopic Multiple Live Births                   Home Medications    Prior to Admission medications   Medication Sig Start Date End Date Taking? Authorizing Provider  acetaminophen-codeine (TYLENOL #3) 300-30 MG tablet Take 1-2 tablets by mouth every 6 (six) hours as needed for moderate pain. 05/18/16   Ivery Quale, PA-C  clindamycin (CLEOCIN) 150 MG capsule Take 1 capsule (150 mg total) by mouth every 6 (six) hours. 05/18/16   Ivery Quale, PA-C    Family History Family History  Problem Relation Age of Onset  . Cancer Mother   .  Diabetes Mother   . Colon cancer Father     AFTER AGE 5  . Crohn's disease Sister     Social History Social History  Substance Use Topics  . Smoking status: Never Smoker  . Smokeless tobacco: Never Used  . Alcohol use No     Allergies   Mango flavor; Penicillins; Tramadol; Zofran [ondansetron hcl]; Bactrim [sulfamethoxazole-trimethoprim]; Keflex [cephalexin]; and Toradol [ketorolac tromethamine]   Review of Systems Review of Systems  Constitutional: Negative for fever.  Gastrointestinal: Positive for abdominal pain, diarrhea and vomiting.  All other systems reviewed and are negative.    Physical Exam Updated Vital Signs BP 126/66 (BP Location: Left Arm)   Pulse 67   Temp 97.7 F (36.5 C) (Oral)   Resp 18   Ht 5'  (1.524 m)   Wt 176 lb (79.8 kg)   LMP 05/31/2016   SpO2 98%   BMI 34.37 kg/m   Physical Exam  HENT:  No cervical lymphadenopathy.  Airway patent, oropharynx clear.    Eyes: Conjunctivae are normal. Pupils are equal, round, and reactive to light.  Cardiovascular: Normal rate and regular rhythm.  Exam reveals no gallop and no friction rub.   Pulses:      Radial pulses are 2+ on the right side, and 2+ on the left side.  Pulmonary/Chest: Effort normal.  Symmetrical rise and fall of the chest.  Lungs clear.    Abdominal: Soft. She exhibits no distension.  Bowel sounds present and active.  Periumbilical discomfort to palpation.    Musculoskeletal: She exhibits no edema.  Skin: Capillary refill takes less than 2 seconds.     ED Treatments / Results   DIAGNOSTIC STUDIES:  Oxygen Saturation is 98% on RA, NML by my interpretation.    COORDINATION OF CARE:  2:14 PM Lipase, CBC, CMET and urine analysis, IV fluids and antiemetic medication.  Attempt to obtain sample for C-difficile.  Discussed treatment plan with pt at bedside and pt agreed to plan.  Labs (all labs ordered are listed, but only abnormal results are displayed) Labs Reviewed  CBC  LIPASE, BLOOD  COMPREHENSIVE METABOLIC PANEL  URINALYSIS, ROUTINE W REFLEX MICROSCOPIC (NOT AT [facility])  PREGNANCY, URINE    EKG  EKG Interpretation None       Radiology No results found.  Procedures Procedures (including critical care time)  Medications Ordered in ED Medications - No data to display   Initial Impression / Assessment and Plan / ED Course  I have reviewed the triage vital signs and the nursing notes.  Pertinent labs & imaging results that were available during my care of the patient were reviewed by me and considered in my medical decision making (see chart for details).  Clinical Course    **I have reviewed nursing notes, vital signs, and all appropriate lab and imaging results for this patient.*  Final  Clinical Impressions(s) / ED Diagnoses  No vomiting after patient received the entire medics. Patient tolerating IV fluids without problem.  Notified by nursing staff that patient left the emergency department AGAINST MEDICAL ADVICE.  The potassium was slightly low at 3.4 the remainder of the competence of metabolic panel was within normal limits. The complete blood count was within normal limits.  According to nursing staff the patient was ambulatory without problem but leaving the hospital.    Final diagnoses:  Periumbilical abdominal pain  Nausea vomiting and diarrhea    New Prescriptions New Prescriptions   No medications on file   **I personally  performed the services described in this documentation, which was scribed in my presence. The recorded information has been reviewed and is accurate.Ivery Quale, PA-C 07/06/16 [id number]    Samuel Jester, DO 07/06/16 2055

## 2016-07-05 NOTE — ED Notes (Signed)
Pt says she cannot provide a stool sample without getting something to eat.

## 2016-07-27 ENCOUNTER — Other Ambulatory Visit (HOSPITAL_COMMUNITY): Payer: Self-pay | Admitting: Internal Medicine

## 2016-07-27 DIAGNOSIS — Z780 Asymptomatic menopausal state: Secondary | ICD-10-CM

## 2016-08-07 ENCOUNTER — Inpatient Hospital Stay (HOSPITAL_COMMUNITY): Admission: RE | Admit: 2016-08-07 | Payer: Medicare Other | Source: Ambulatory Visit

## 2016-12-06 IMAGING — CT CT ABD-PELV W/ CM
2 of 5 series · 17 of 46 positions shown, 19 images · IV contrast (OMNIPAQUE)
Comparison: 03/31/2015 and prior CTs

CLINICAL DATA: 44-year-old female with flank and back pain, nausea,
and vomiting for 1 month. Fever for 2 days. Recently on antibiotics.

EXAM:
CT ABDOMEN AND PELVIS WITH CONTRAST
TECHNIQUE: Multidetector CT imaging of the abdomen and pelvis was performed
using the standard protocol following bolus administration of
intravenous contrast.
CONTRAST:  100mL OMNIPAQUE IOHEXOL 300 MG/ML  SOLN

[Series 2: rtn a/p with · axial · 0.77mm/px · z∈[-323,+77]mm · 14 of 91 slices shown, 16 images]
[im 6/91  soft-tissue]
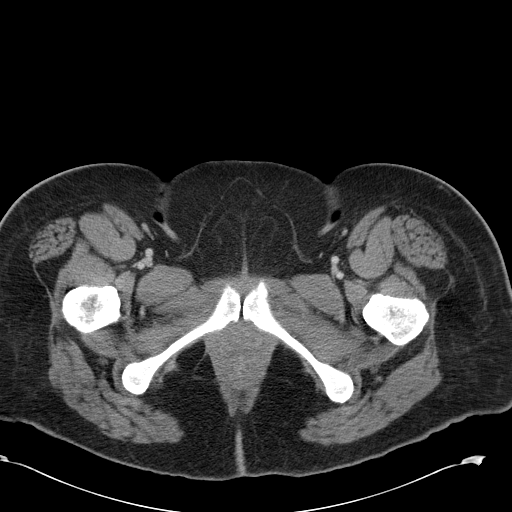
[im 6/91  bone]
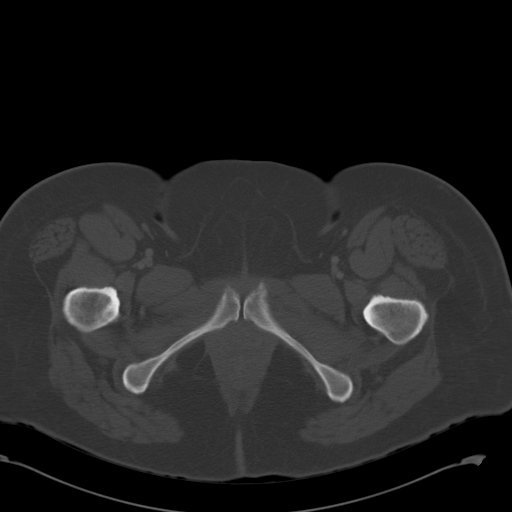
[im 11/91  soft-tissue]
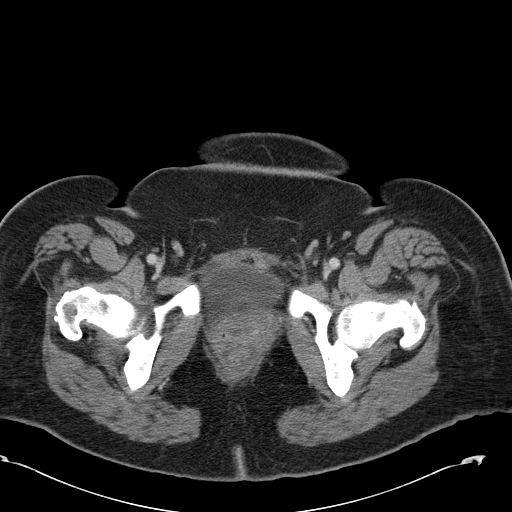
[im 21/91  soft-tissue]
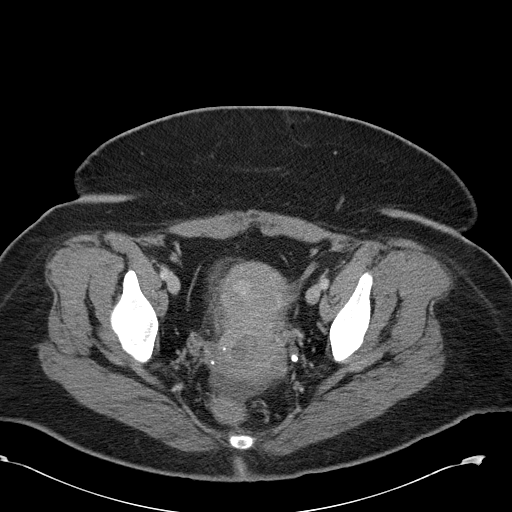
[im 26/91  soft-tissue]
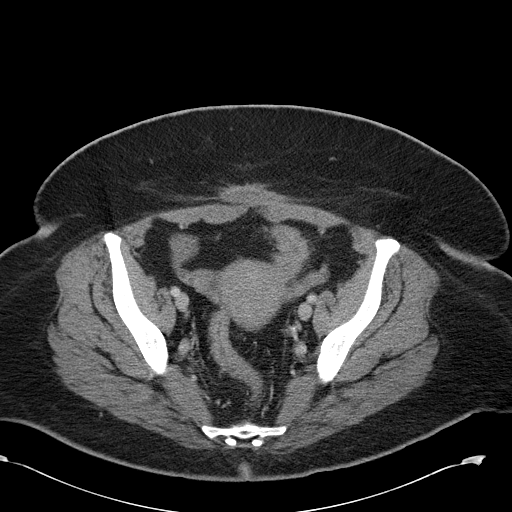
[im 31/91  soft-tissue]
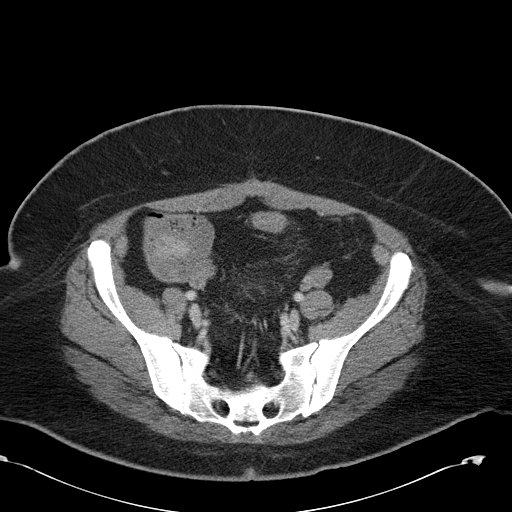
[im 36/91  soft-tissue]
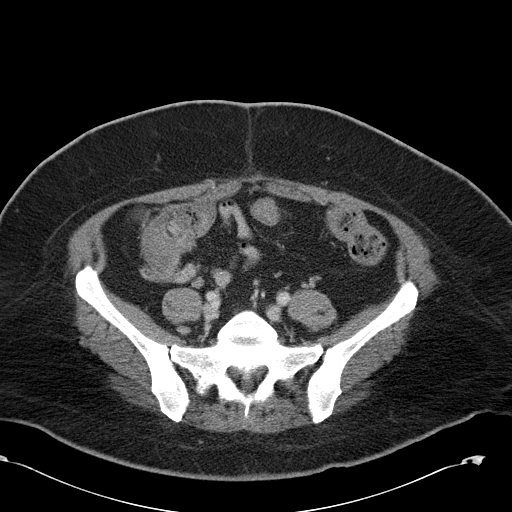
[im 41/91  soft-tissue]
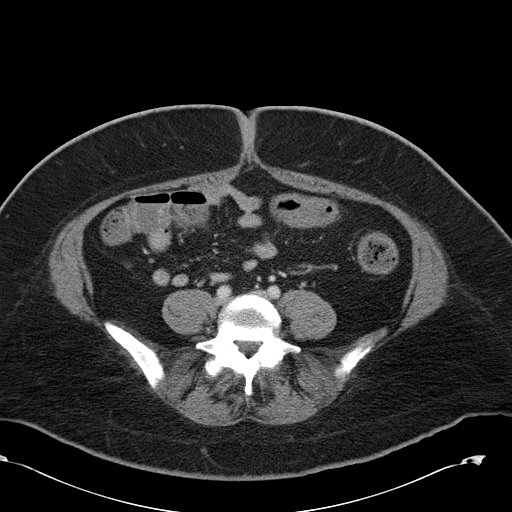
[im 51/91  soft-tissue]
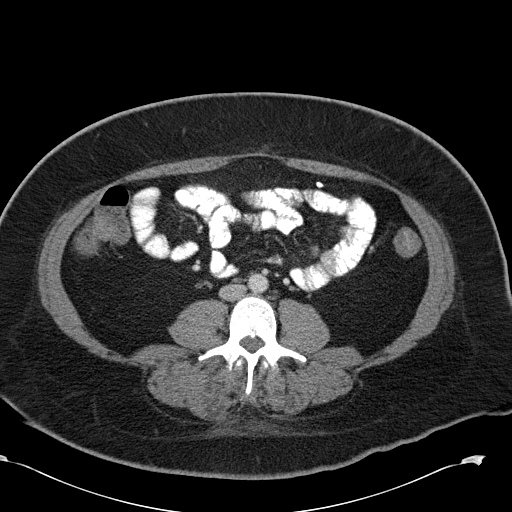
[im 56/91  soft-tissue]
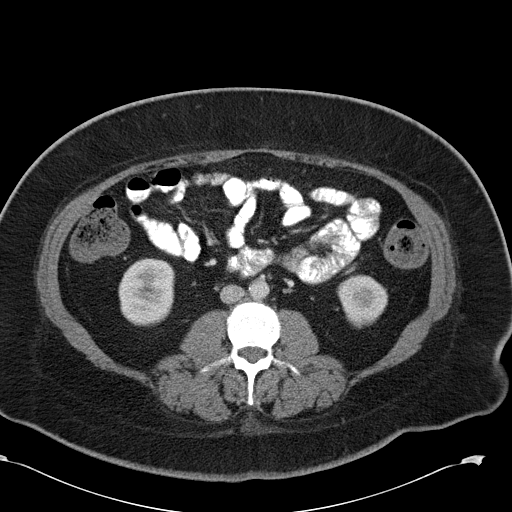
[im 56/91  bone]
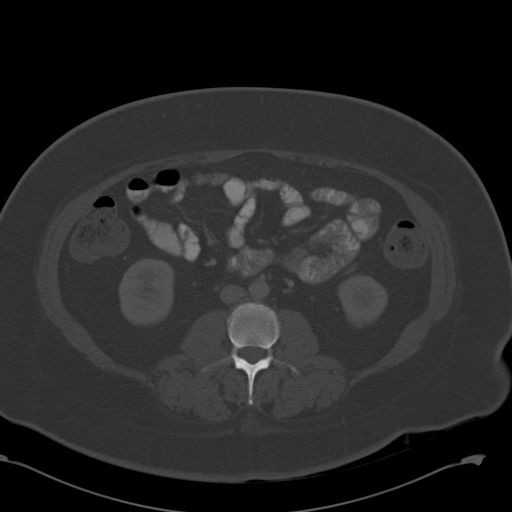
[im 61/91  soft-tissue]
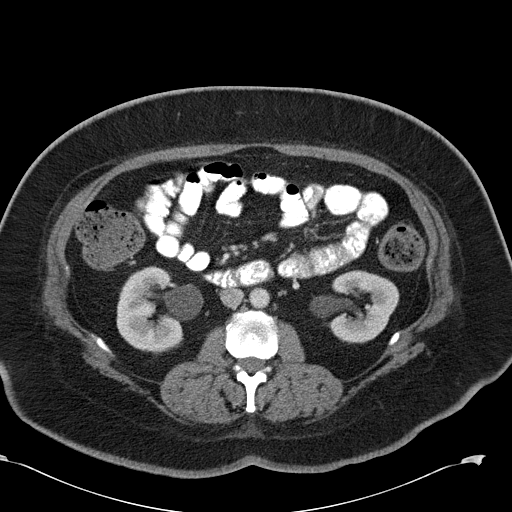
[im 66/91  soft-tissue]
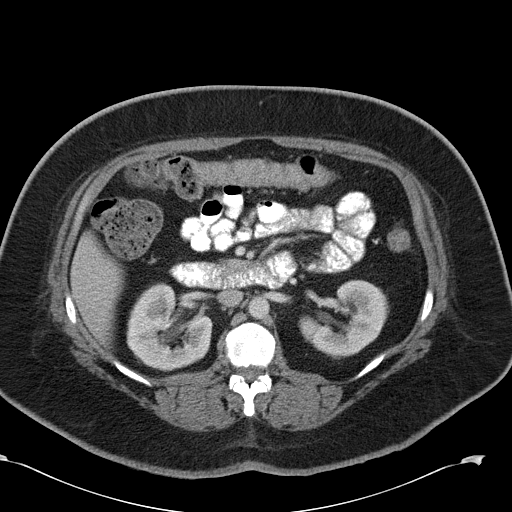
[im 71/91  soft-tissue]
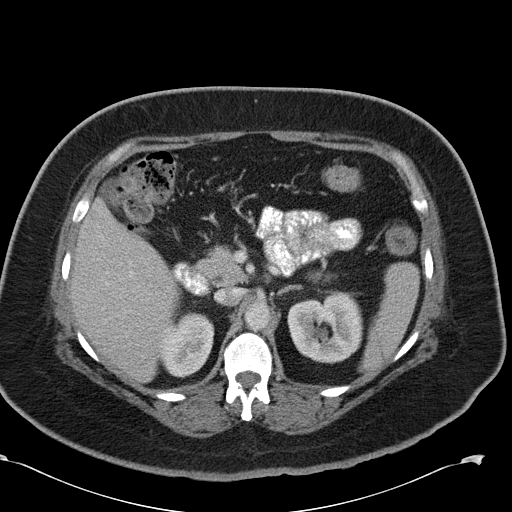
[im 81/91  soft-tissue]
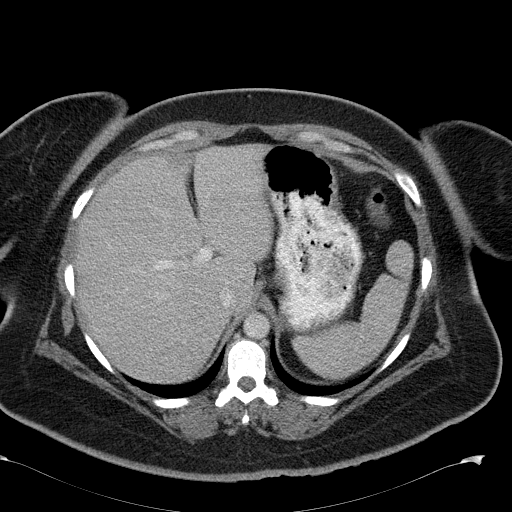
[im 86/91  soft-tissue]
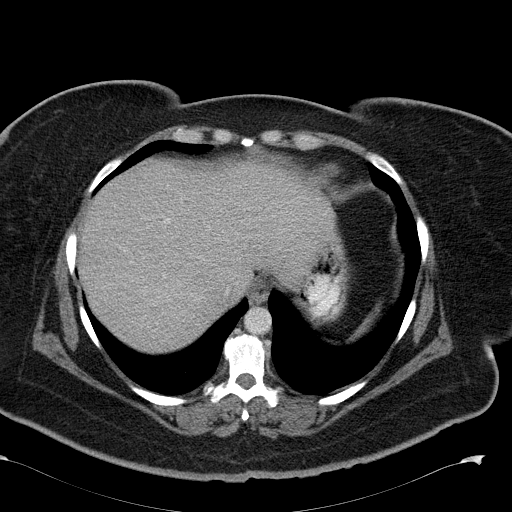

[Series 604: <mpr thick range> · coronal · 0.88mm/px · 3 of 92 slices shown]
[im 31/92  soft-tissue]
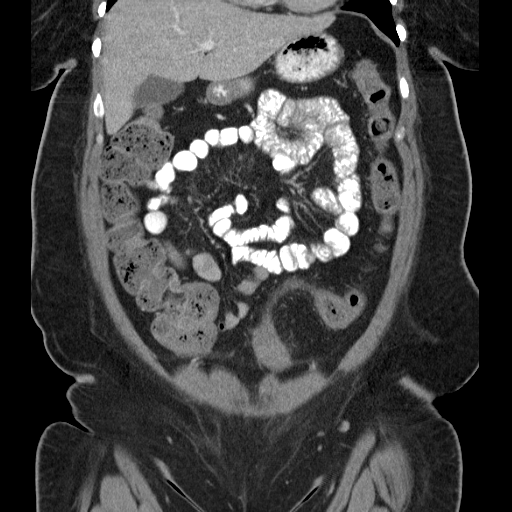
[im 41/92  soft-tissue]
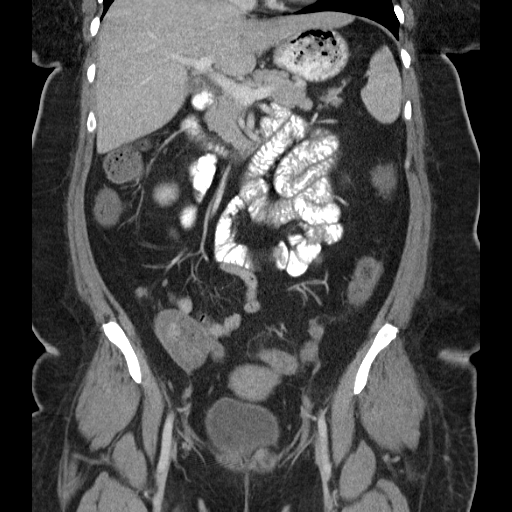
[im 51/92  soft-tissue]
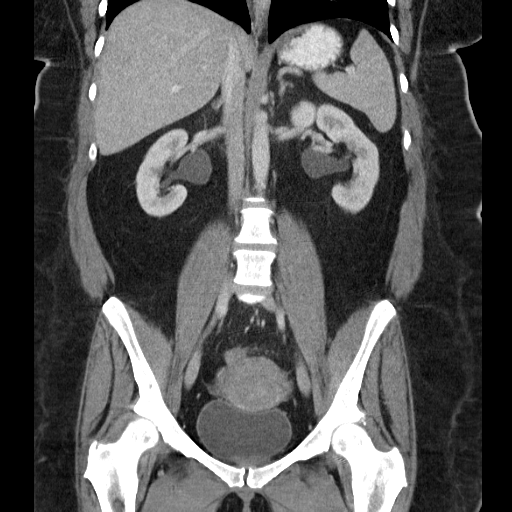

[17 of 46 positions shown; findings below may reference images not displayed]

FINDINGS: Lower chest:  Unremarkable

Hepatobiliary: The gallbladder and liver are unremarkable. There is
no evidence of biliary dilatation.

Pancreas: Unremarkable

Spleen: Unremarkable

Adrenals/Urinary Tract: The kidneys, adrenal glands and bladder are
unremarkable.

Stomach/Bowel: There is mild circumferential wall thickening of the
descending and sigmoid colon with mild adjacent inflammation
compatible with colitis. There is no evidence of bowel obstruction
or pneumoperitoneum. The appendix is normal.

Vascular/Lymphatic: No enlarged lymph nodes or abdominal aortic
aneurysm.

Reproductive: The uterus and adnexal regions are unremarkable.

Other: A small amount of free fluid within the pelvis is noted.
There is no evidence of abscess.

Musculoskeletal: No acute or suspicious abnormalities identified.
IMPRESSION: Descending and sigmoid colitis which may be pseudomembranous with
recent antibiotic treatment. No evidence of bowel obstruction,
pneumoperitoneum or abscess.

## 2017-03-21 ENCOUNTER — Encounter (HOSPITAL_COMMUNITY): Payer: Self-pay | Admitting: Emergency Medicine

## 2017-03-21 ENCOUNTER — Emergency Department (HOSPITAL_COMMUNITY)
Admission: EM | Admit: 2017-03-21 | Discharge: 2017-03-21 | Disposition: A | Discharge status: Home / Self Care | Payer: Medicare Other | Attending: Emergency Medicine | Admitting: Emergency Medicine

## 2017-03-21 DIAGNOSIS — M5442 Lumbago with sciatica, left side: Secondary | ICD-10-CM | POA: Diagnosis not present

## 2017-03-21 DIAGNOSIS — E119 Type 2 diabetes mellitus without complications: Secondary | ICD-10-CM | POA: Diagnosis not present

## 2017-03-21 DIAGNOSIS — M5441 Lumbago with sciatica, right side: Secondary | ICD-10-CM | POA: Diagnosis not present

## 2017-03-21 DIAGNOSIS — I10 Essential (primary) hypertension: Secondary | ICD-10-CM | POA: Insufficient documentation

## 2017-03-21 DIAGNOSIS — M545 Low back pain: Secondary | ICD-10-CM | POA: Diagnosis present

## 2017-03-21 MED ORDER — PREDNISONE 10 MG PO TABS: 40.0000 mg | ORAL_TABLET | Freq: Every day | ORAL | 0 refills | Status: AC

## 2017-03-21 MED ORDER — IBUPROFEN 600 MG PO TABS: 600.0000 mg | ORAL_TABLET | Freq: Four times a day (QID) | ORAL | 0 refills | Status: DC | PRN

## 2017-03-21 MED ORDER — HYDROCODONE-ACETAMINOPHEN 5-325 MG PO TABS
1.0000 | ORAL_TABLET | Freq: Once | ORAL | Status: AC
  Administered 2017-03-21: 1 via ORAL
  Filled 2017-03-21: qty 1

## 2017-03-21 MED ORDER — LIDOCAINE 5 % EX PTCH: 1.0000 | MEDICATED_PATCH | CUTANEOUS | 0 refills | Status: DC

## 2017-03-21 MED ORDER — METHOCARBAMOL 500 MG PO TABS: 500.0000 mg | ORAL_TABLET | Freq: Two times a day (BID) | ORAL | 0 refills | Status: DC

## 2017-03-21 NOTE — ED Notes (Signed)
Pt reports that she awakened yesterday with numb butt checks bilaterally-She reports back pain that is chronic, followed by Dr Selena Batten who usually prescribes prednisone, muscle relaxants and pain meds  She moves to move higher in bed without change of facial expression and reports her pain as 8/10

## 2017-03-21 NOTE — ED Provider Notes (Signed)
[facility] Provider Note   CSN: [id number] Arrival date & time: 03/21/17  1206     History   Chief Complaint Chief Complaint  Patient presents with  . Back Pain    HPI Regina Patton is a 46 y.o. female.  HPI   Regina Patton is a 46 y.o. female, with a history of Chronic back pain, DM, and HTN, presenting to the ED with Back pain beginning yesterday. Patient states she woke up with bilateral lower back pain. Pain is sharp, 8/10, radiating down both of her legs accompanied by tingling. She states her pain is usually unilateral and radiates down one leg or the other. She has been told that she would eventually need surgery on her back "due to worsening sciatica." She is followed by her PCP for her previous sciatica problems. She has been taking ibuprofen, Tylenol, cold, and heat with no relief. Patient denies weakness, fever/chills, changes in bowel or bladder function, saddle anesthesias, trauma or falls, abdominal pain, or any other complaints.     Past Medical History:  Diagnosis Date  . C. difficile diarrhea   . Cervical radiculopathy   . Chronic abdominal pain   . Chronic back pain   . Chronic knee pain   . Chronic neck pain   . Diabetes mellitus without complication (HCC)    diet controlled  . Fibromyalgia   . Hypertension   . Rheumatoid arthritis (HCC)    "Rhematoid factor positive but no symptoms" per EPIC notes 2012  . Rib pain on right side    chronic  . Sciatic pain    right    Patient Active Problem List   Diagnosis Date Noted  . Cholecystitis with cholelithiasis 10/17/2015  . C. difficile colitis 04/19/2015  . Essential hypertension 04/05/2015  . Chronic pain syndrome 04/05/2015    Past Surgical History:  Procedure Laterality Date  . BLADDER SURGERY    . CHOLECYSTECTOMY N/A 10/18/2015   Procedure: LAPAROSCOPIC CHOLECYSTECTOMY;  Surgeon: Franky Macho, MD;  Location: [facility];  Service: General;  Laterality: N/A;  . TUBAL LIGATION      OB  History    Gravida Para Term Preterm AB Living   3 3 3     3    SAB TAB Ectopic Multiple Live Births                   Home Medications    Prior to Admission medications   Medication Sig Start Date End Date Taking? Authorizing Provider  acetaminophen-codeine (TYLENOL #3) 300-30 MG tablet Take 1-2 tablets by mouth every 6 (six) hours as needed for moderate pain. 05/18/16   Ivery Quale, PA-C  clindamycin (CLEOCIN) 150 MG capsule Take 1 capsule (150 mg total) by mouth every 6 (six) hours. 05/18/16   Ivery Quale, PA-C  ibuprofen (ADVIL,MOTRIN) 600 MG tablet Take 1 tablet (600 mg total) by mouth every 6 (six) hours as needed. 03/21/17   Joy, Shawn C, PA-C  lidocaine (LIDODERM) 5 % Place 1 patch onto the skin daily. Remove & Discard patch within 12 hours or as directed by MD 03/21/17   Joy, Shawn C, PA-C  methocarbamol (ROBAXIN) 500 MG tablet Take 1 tablet (500 mg total) by mouth 2 (two) times daily. 03/21/17   Joy, Shawn C, PA-C  predniSONE (DELTASONE) 10 MG tablet Take 4 tablets (40 mg total) by mouth daily. 03/21/17 03/26/17  Anselm Pancoast, PA-C    Family History Family History  Problem Relation Age of Onset  .  Cancer Mother   . Diabetes Mother   . Colon cancer Father        AFTER AGE 56  . Crohn's disease Sister     Social History Social History  Substance Use Topics  . Smoking status: Never Smoker  . Smokeless tobacco: Never Used  . Alcohol use No     Allergies   Mango flavor; Penicillins; Tramadol; Zofran [ondansetron hcl]; Bactrim [sulfamethoxazole-trimethoprim]; Keflex [cephalexin]; and Toradol [ketorolac tromethamine]   Review of Systems Review of Systems  Constitutional: Negative for chills, diaphoresis and fever.  Respiratory: Negative for shortness of breath.   Cardiovascular: Negative for chest pain.  Gastrointestinal: Negative for abdominal pain, nausea and vomiting.  Genitourinary: Negative for difficulty urinating.  Musculoskeletal: Positive for back pain.  Negative for neck pain.  Neurological: Negative for weakness and numbness.       Bilateral lower extremity tingling  All other systems reviewed and are negative.    Physical Exam Updated Vital Signs BP 125/88 (BP Location: Right Arm)   Pulse 80   Temp 97.6 F (36.4 C) (Tympanic)   Resp 16   Ht 5' (1.524 m)   Wt 78 kg (172 lb)   LMP 03/21/2017   SpO2 98%   BMI 33.59 kg/m   Physical Exam  Constitutional: She appears well-developed and well-nourished. No distress.  HENT:  Head: Normocephalic and atraumatic.  Eyes: Conjunctivae are normal.  Neck: Neck supple.  Cardiovascular: Normal rate, regular rhythm, normal heart sounds and intact distal pulses.   Pulmonary/Chest: Effort normal and breath sounds normal. No respiratory distress.  Abdominal: Soft. There is no tenderness. There is no guarding.  Musculoskeletal: She exhibits no edema.  Tenderness across the bilateral lumbar musculature extending into the bilateral buttocks. No noted step-off, instability, swelling, crepitus, or deformity. Full range of motion in the bilateral hips, knees, and ankles.  Lymphadenopathy:    She has no cervical adenopathy.  Neurological: She is alert.  No sensory deficits. Strength 5/5 in the cardinal directions of the bilateral hips, knees, and ankles. Patient has a slow, antalgic gait, however, is able to ambulate without assistance. No signs of incontinence. Coordination intact including heel to shin and finger to nose. Cranial nerves III-XII grossly intact. No facial droop.   Skin: Skin is warm and dry. She is not diaphoretic.  Psychiatric: She has a normal mood and affect. Her behavior is normal.  Nursing note and vitals reviewed.    ED Treatments / Results  Labs (all labs ordered are listed, but only abnormal results are displayed) Labs Reviewed - No data to display  EKG  EKG Interpretation None       Radiology No results found.  Procedures Procedures (including critical care  time)  Medications Ordered in ED Medications  HYDROcodone-acetaminophen (NORCO/VICODIN) 5-325 MG per tablet 1 tablet (1 tablet Oral Given 03/21/17 1315)     Initial Impression / Assessment and Plan / ED Course  I have reviewed the triage vital signs and the nursing notes.  Pertinent labs & imaging results that were available during my care of the patient were reviewed by me and considered in my medical decision making (see chart for details).      Patient presents with symptoms of bilateral sciatica. Low suspicion for cauda equina syndrome and other emergent pathologies. However, patient would benefit from MRI and PCP/neurosurgery follow-up. Outpatient MRI ordered. The patient was given instructions for home care as well as return precautions. Patient voices understanding of these instructions, accepts the plan, and  is comfortable with discharge.  Findings and plan of care discussed with Donnetta Hutching, MD. Dr. Adriana Simas personally evaluated and examined this patient.    Final Clinical Impressions(s) / ED Diagnoses   Final diagnoses:  Acute bilateral low back pain with bilateral sciatica    New Prescriptions New Prescriptions   IBUPROFEN (ADVIL,MOTRIN) 600 MG TABLET    Take 1 tablet (600 mg total) by mouth every 6 (six) hours as needed.   LIDOCAINE (LIDODERM) 5 %    Place 1 patch onto the skin daily. Remove & Discard patch within 12 hours or as directed by MD   METHOCARBAMOL (ROBAXIN) 500 MG TABLET    Take 1 tablet (500 mg total) by mouth 2 (two) times daily.   PREDNISONE (DELTASONE) 10 MG TABLET    Take 4 tablets (40 mg total) by mouth daily.     Anselm Pancoast, PA-C 03/21/17 1322    Donnetta Hutching, MD 03/23/17 [phone]

## 2017-03-21 NOTE — ED Triage Notes (Signed)
Patient c/o low back pain that radiates into legs bilaterally. Per patient "woke yesterday with my butt checks numb." Per patient hx of sciatica. Patient reports using tylenol, ibuprofen, and heat/cold therapy with no relief of pain.

## 2017-03-21 NOTE — ED Notes (Signed)
Mr Regina Patton in to assess and discuss

## 2017-03-21 NOTE — ED Notes (Signed)
Difficulty giving DC instruction as pt would not stop speaking on her cell phone

## 2017-03-21 NOTE — Discharge Instructions (Signed)
Take it easy, but do not lay around too much as this may make any stiffness worse.  Antiinflammatory medications: Take 600 mg of ibuprofen every 6 hours or 440 mg (over the counter dose) to 500 mg (prescription dose) of naproxen every 12 hours or for the next 3 days. After this time, these medications may be used as needed for pain. Take these medications with food to avoid upset stomach. Choose only one of these medications, do not take them together.  Tylenol: Should you continue to have additional pain while taking the ibuprofen or naproxen, you may add in tylenol as needed. Your daily total maximum amount of tylenol from all sources should be limited to 4000mg /day for persons without liver problems, or 2000mg /day for those with liver problems. Muscle relaxer: Robaxin is a muscle relaxer and may help loosen stiff muscles. Do not take the Robaxin while driving or performing other dangerous activities.  Lidocaine patches: These are available via either prescription or over-the-counter. The over-the-counter option may be more economical one and are likely just as effective. There are multiple over-the-counter brands, such as Salonpas. Exercises: Be sure to perform the attached exercises starting with three times a week and working up to performing them daily. This is an essential part of preventing long term problems.   MRI: An order for a MRI of your back has been placed. You will need to call to schedule an appointment for your MRI. Call Centralized Scheduling at [phone] and set up a time. You should then follow up with your PCP for the results of this scan. You will then need to follow up with the neurosurgeon.

## 2017-09-04 ENCOUNTER — Emergency Department
Admission: EM | Admit: 2017-09-04 | Discharge: 2017-09-04 | Disposition: A | Discharge status: Home / Self Care | Payer: Medicare Other | Attending: Student in an Organized Health Care Education/Training Program | Admitting: Student in an Organized Health Care Education/Training Program

## 2017-09-04 ENCOUNTER — Encounter: Payer: Self-pay | Admitting: *Deleted

## 2017-09-04 ENCOUNTER — Other Ambulatory Visit: Payer: Self-pay

## 2017-09-04 DIAGNOSIS — Z79899 Other long term (current) drug therapy: Secondary | ICD-10-CM | POA: Diagnosis not present

## 2017-09-04 DIAGNOSIS — E119 Type 2 diabetes mellitus without complications: Secondary | ICD-10-CM | POA: Diagnosis not present

## 2017-09-04 DIAGNOSIS — M5416 Radiculopathy, lumbar region: Secondary | ICD-10-CM | POA: Insufficient documentation

## 2017-09-04 DIAGNOSIS — M5410 Radiculopathy, site unspecified: Secondary | ICD-10-CM

## 2017-09-04 DIAGNOSIS — I10 Essential (primary) hypertension: Secondary | ICD-10-CM | POA: Diagnosis not present

## 2017-09-04 DIAGNOSIS — R200 Anesthesia of skin: Secondary | ICD-10-CM | POA: Insufficient documentation

## 2017-09-04 DIAGNOSIS — M545 Low back pain: Secondary | ICD-10-CM | POA: Diagnosis present

## 2017-09-04 MED ORDER — METHYLPREDNISOLONE 4 MG PO TBPK: ORAL_TABLET | ORAL | 0 refills | Status: DC

## 2017-09-04 MED ORDER — METHYLPREDNISOLONE SODIUM SUCC 125 MG IJ SOLR
125.0000 mg | Freq: Once | INTRAMUSCULAR | Status: AC
  Administered 2017-09-04: 125 mg via INTRAMUSCULAR
  Filled 2017-09-04: qty 2

## 2017-09-04 MED ORDER — METHOCARBAMOL 750 MG PO TABS: 750.0000 mg | ORAL_TABLET | Freq: Four times a day (QID) | ORAL | 0 refills | Status: DC

## 2017-09-04 MED ORDER — ORPHENADRINE CITRATE 30 MG/ML IJ SOLN
60.0000 mg | Freq: Two times a day (BID) | INTRAMUSCULAR | Status: DC
  Administered 2017-09-04: 60 mg via INTRAMUSCULAR
  Filled 2017-09-04: qty 2

## 2017-09-04 NOTE — ED Triage Notes (Signed)
FIRST NURSE NOTE-here for back pain and some leg tingling since yesterday. Ambulatory without difficulty.

## 2017-09-04 NOTE — ED Provider Notes (Signed)
[facility] Emergency Department Provider Note   ____________________________________________   First MD Initiated Contact with Patient 09/04/17 1140     (approximate)  I have reviewed the triage vital signs and the nursing notes.   HISTORY  Chief Complaint Back Pain and Leg Pain    HPI Regina Patton is a 46 y.o. female patient's report with radicular back pain to the left lower extremity. Patient also states numbness to the left lower extremity. Patient denies bladder or bowel dysfunction. Patient states similar complaint that resolved with steroids and muscle relaxers. Patient states she was scheduled for an MRI but due to relocated have had the procedure performed. Patient states she is now having to wait for Medicaid to assign her a new treating doctor. Patient rates the pain discomfort as 8/10. Patient described her pain as "tingling". No palliative measures for complaint.   Past Medical History:  Diagnosis Date  . C. difficile diarrhea   . Cervical radiculopathy   . Chronic abdominal pain   . Chronic back pain   . Chronic knee pain   . Chronic neck pain   . Diabetes mellitus without complication (HCC)    diet controlled  . Fibromyalgia   . Hypertension   . Rheumatoid arthritis (HCC)    "Rhematoid factor positive but no symptoms" per EPIC notes 2012  . Rib pain on right side    chronic  . Sciatic pain    right    Patient Active Problem List   Diagnosis Date Noted  . Cholecystitis with cholelithiasis 10/17/2015  . C. difficile colitis 04/19/2015  . Essential hypertension 04/05/2015  . Chronic pain syndrome 04/05/2015    Past Surgical History:  Procedure Laterality Date  . BLADDER SURGERY    . CHOLECYSTECTOMY N/A 10/18/2015   Procedure: LAPAROSCOPIC CHOLECYSTECTOMY;  Surgeon: Franky Macho, MD;  Location: [facility];  Service: General;  Laterality: N/A;  . TUBAL LIGATION      Prior to Admission medications   Medication Sig Start  Date End Date Taking? Authorizing Provider  acetaminophen-codeine (TYLENOL #3) 300-30 MG tablet Take 1-2 tablets by mouth every 6 (six) hours as needed for moderate pain. 05/18/16   Ivery Quale, PA-C  clindamycin (CLEOCIN) 150 MG capsule Take 1 capsule (150 mg total) by mouth every 6 (six) hours. 05/18/16   Ivery Quale, PA-C  ibuprofen (ADVIL,MOTRIN) 600 MG tablet Take 1 tablet (600 mg total) by mouth every 6 (six) hours as needed. 03/21/17   Joy, Shawn C, PA-C  lidocaine (LIDODERM) 5 % Place 1 patch onto the skin daily. Remove & Discard patch within 12 hours or as directed by MD 03/21/17   Joy, Shawn C, PA-C  methocarbamol (ROBAXIN) 500 MG tablet Take 1 tablet (500 mg total) by mouth 2 (two) times daily. 03/21/17   Joy, Shawn C, PA-C  methocarbamol (ROBAXIN-750) 750 MG tablet Take 1 tablet (750 mg total) by mouth 4 (four) times daily. 09/04/17   Joni Reining, PA-C  methylPREDNISolone (MEDROL DOSEPAK) 4 MG TBPK tablet Take Tapered dose as directed 09/04/17   Joni Reining, PA-C    Allergies Mango flavor; Penicillins; Tramadol; Zofran [ondansetron hcl]; Bactrim [sulfamethoxazole-trimethoprim]; Keflex [cephalexin]; and Toradol [ketorolac tromethamine]  Family History  Problem Relation Age of Onset  . Cancer Mother   . Diabetes Mother   . Colon cancer Father        AFTER AGE 67  . Crohn's disease Sister     Social History Social History   Tobacco  Use  . Smoking status: Never Smoker  . Smokeless tobacco: Never Used  Substance Use Topics  . Alcohol use: No  . Drug use: No    Review of Systems Constitutional: No fever/chills Eyes: No visual changes. ENT: No sore throat. Cardiovascular: Denies chest pain. Respiratory: Denies shortness of breath. Gastrointestinal: No abdominal pain.  No nausea, no vomiting.  No diarrhea.  No constipation. Genitourinary: Negative for dysuria. Musculoskeletal: Diffuse low back pain Skin: Negative for rash. Neurological: Negative for headaches,  left leg numbness. Endocrine:Hypertension. Diabetes controlled with diet Allergic/Immunilogical: See medication list ____________________________________________   PHYSICAL EXAM:  VITAL SIGNS: ED Triage Vitals [09/04/17 1110]  Enc Vitals Group     BP 115/83     Pulse Rate 83     Resp 16     Temp 97.9 F (36.6 C)     Temp Source Oral     SpO2 98 %     Weight 179 lb (81.2 kg)     Height 5' (1.524 m)     Head Circumference      Peak Flow      Pain Score 8     Pain Loc      Pain Edu?      Excl. in GC?     Constitutional: Alert and oriented. Well appearing and in no acute distress. Cardiovascular: Normal rate, regular rhythm. Grossly normal heart sounds.  Good peripheral circulation. Respiratory: Normal respiratory effort.  No retractions. Lungs CTAB. Musculoskeletal: No obvious spinal deformity. Guarding palpation of elbow 3 through L5. Negative straight leg test. No lower extremity tenderness nor edema.  No joint effusions. Neurologic:  Normal speech and language. No gross focal neurologic deficits are appreciated. No gait instability. Skin:  Skin is warm, dry and intact. No rash noted. Psychiatric: Mood and affect are normal. Speech and behavior are normal.  ____________________________________________   LABS (all labs ordered are listed, but only abnormal results are displayed)  Labs Reviewed - No data to display ____________________________________________  EKG   ____________________________________________  RADIOLOGY  No results found.  ____________________________________________   PROCEDURES  Procedure(s) performed: None  Procedures  Critical Care performed: No  ____________________________________________   INITIAL IMPRESSION / ASSESSMENT AND PLAN / ED COURSE  As part of my medical decision making, I reviewed the following data within the electronic MEDICAL RECORD NUMBER    Radicular back pain. Patient given discharge Instruction. Patient given  Solu Medrol and Norflex prior to departure. Patient advised follow-up with her treating doctor for continued care. Take medication as directed.      ____________________________________________   FINAL CLINICAL IMPRESSION(S) / ED DIAGNOSES  Final diagnoses:  Radicular low back pain     ED Discharge Orders        Ordered    methylPREDNISolone (MEDROL DOSEPAK) 4 MG TBPK tablet     09/04/17 1150    methocarbamol (ROBAXIN-750) 750 MG tablet  4 times daily     09/04/17 1150       Note:  This document was prepared using Dragon voice recognition software and may include unintentional dictation errors.    Joni Reining, PA-C 09/04/17 1157    Willy Eddy, MD 09/04/17 [phone]

## 2017-09-04 NOTE — ED Triage Notes (Signed)
Pt to ED reporting lower back pain and left leg pain and numbness that started last night. Pt reports having had similar pain in the past that was resolved in ED after taking steroids and muscle relaxers. Pt reports tylenol and heat have not helped to resolve pain. Pt is ambulatory to triage and no neuro deficits noted at this time.

## 2017-09-19 ENCOUNTER — Emergency Department: Payer: Medicare Other

## 2017-09-19 ENCOUNTER — Other Ambulatory Visit: Payer: Self-pay

## 2017-09-19 ENCOUNTER — Encounter: Payer: Self-pay | Admitting: Emergency Medicine

## 2017-09-19 ENCOUNTER — Emergency Department
Admission: EM | Admit: 2017-09-19 | Discharge: 2017-09-19 | Disposition: A | Discharge status: Home / Self Care | Payer: Medicare Other | Attending: Emergency Medicine | Admitting: Emergency Medicine

## 2017-09-19 DIAGNOSIS — I10 Essential (primary) hypertension: Secondary | ICD-10-CM | POA: Diagnosis not present

## 2017-09-19 DIAGNOSIS — E119 Type 2 diabetes mellitus without complications: Secondary | ICD-10-CM | POA: Diagnosis not present

## 2017-09-19 DIAGNOSIS — Z79899 Other long term (current) drug therapy: Secondary | ICD-10-CM | POA: Insufficient documentation

## 2017-09-19 DIAGNOSIS — R05 Cough: Secondary | ICD-10-CM | POA: Diagnosis present

## 2017-09-19 DIAGNOSIS — J40 Bronchitis, not specified as acute or chronic: Secondary | ICD-10-CM

## 2017-09-19 DIAGNOSIS — Z9049 Acquired absence of other specified parts of digestive tract: Secondary | ICD-10-CM | POA: Diagnosis not present

## 2017-09-19 LAB — CBC WITH DIFFERENTIAL/PLATELET
BASOS ABS: 0 10*3/uL (ref 0–0.1)
BASOS PCT: 0 %
EOS ABS: 0.2 10*3/uL (ref 0–0.7)
Eosinophils Relative: 2 %
HCT: 41.9 % (ref 35.0–47.0)
Hemoglobin: 14.4 g/dL (ref 12.0–16.0)
Lymphocytes Relative: 21 %
Lymphs Abs: 1.5 10*3/uL (ref 1.0–3.6)
MCH: 31.1 pg (ref 26.0–34.0)
MCHC: 34.4 g/dL (ref 32.0–36.0)
MCV: 90.4 fL (ref 80.0–100.0)
MONOS PCT: 6 %
Monocytes Absolute: 0.4 10*3/uL (ref 0.2–0.9)
NEUTROS ABS: 4.8 10*3/uL (ref 1.4–6.5)
Neutrophils Relative %: 71 %
Platelets: 187 10*3/uL (ref 150–440)
RBC: 4.63 MIL/uL (ref 3.80–5.20)
RDW: 13.4 % (ref 11.5–14.5)
WBC: 6.8 10*3/uL (ref 3.6–11.0)

## 2017-09-19 LAB — INFLUENZA PANEL BY PCR (TYPE A & B)
INFLAPCR: NEGATIVE
Influenza B By PCR: NEGATIVE

## 2017-09-19 LAB — BASIC METABOLIC PANEL
Anion gap: 10 (ref 5–15)
BUN: 6 mg/dL (ref 6–20)
CALCIUM: 9.4 mg/dL (ref 8.9–10.3)
CO2: 23 mmol/L (ref 22–32)
CREATININE: 0.61 mg/dL (ref 0.44–1.00)
Chloride: 105 mmol/L (ref 101–111)
Glucose, Bld: 103 mg/dL — ABNORMAL HIGH (ref 65–99)
Potassium: 3.4 mmol/L — ABNORMAL LOW (ref 3.5–5.1)
SODIUM: 138 mmol/L (ref 135–145)

## 2017-09-19 MED ORDER — METHYLPREDNISOLONE SODIUM SUCC 125 MG IJ SOLR
125.0000 mg | Freq: Once | INTRAMUSCULAR | Status: AC
  Administered 2017-09-19: 125 mg via INTRAVENOUS
  Filled 2017-09-19: qty 2

## 2017-09-19 MED ORDER — ALBUTEROL SULFATE (2.5 MG/3ML) 0.083% IN NEBU
5.0000 mg | INHALATION_SOLUTION | Freq: Once | RESPIRATORY_TRACT | Status: AC
  Administered 2017-09-19: 5 mg via RESPIRATORY_TRACT
  Filled 2017-09-19: qty 6

## 2017-09-19 MED ORDER — ALBUTEROL SULFATE HFA 108 (90 BASE) MCG/ACT IN AERS: 2.0000 | INHALATION_SPRAY | Freq: Four times a day (QID) | RESPIRATORY_TRACT | 2 refills | Status: DC | PRN

## 2017-09-19 MED ORDER — IPRATROPIUM-ALBUTEROL 0.5-2.5 (3) MG/3ML IN SOLN
3.0000 mL | Freq: Once | RESPIRATORY_TRACT | Status: AC
  Administered 2017-09-19: 3 mL via RESPIRATORY_TRACT
  Filled 2017-09-19: qty 3

## 2017-09-19 MED ORDER — DOXYCYCLINE HYCLATE 100 MG PO TABS
100.0000 mg | ORAL_TABLET | Freq: Once | ORAL | Status: AC
  Administered 2017-09-19: 100 mg via ORAL
  Filled 2017-09-19: qty 1

## 2017-09-19 MED ORDER — DOXYCYCLINE HYCLATE 100 MG PO CAPS: 100.0000 mg | ORAL_CAPSULE | Freq: Two times a day (BID) | ORAL | 0 refills | Status: AC

## 2017-09-19 MED ORDER — PREDNISONE 20 MG PO TABS: 60.0000 mg | ORAL_TABLET | Freq: Every day | ORAL | 0 refills | Status: AC

## 2017-09-19 NOTE — ED Provider Notes (Signed)
[facility] Emergency Department Provider Note  ____________________________________________  Time seen: Approximately 1:18 PM  I have reviewed the triage vital signs and the nursing notes.   HISTORY  Chief Complaint Cough and Chest Pain   HPI Regina Patton is a 47 y.o. female with a history of RA, and asthma who presents for evaluation of cough, fever, chest pain. Patient reports 2-3 days of cough productive of yellow/green phlegm, wheezing, shortness of breath and fever as high as 102F. She reports chest pain that she describes as tightness, worse when she takes a deep breath, constant, moderate, nonradiating.she is not a smoker. She reports taking Tylenol this morning for her fever. She also has had diarrhea which is nonbloody for 3 days. No vomiting. No abdominal pain.  Past Medical History:  Diagnosis Date  . C. difficile diarrhea   . Cervical radiculopathy   . Chronic abdominal pain   . Chronic back pain   . Chronic knee pain   . Chronic neck pain   . Diabetes mellitus without complication (HCC)    diet controlled  . Fibromyalgia   . Hypertension   . Rheumatoid arthritis (HCC)    "Rhematoid factor positive but no symptoms" per EPIC notes 2012  . Rib pain on right side    chronic  . Sciatic pain    right    Patient Active Problem List   Diagnosis Date Noted  . Cholecystitis with cholelithiasis 10/17/2015  . C. difficile colitis 04/19/2015  . Essential hypertension 04/05/2015  . Chronic pain syndrome 04/05/2015    Past Surgical History:  Procedure Laterality Date  . BLADDER SURGERY    . CHOLECYSTECTOMY N/A 10/18/2015   Procedure: LAPAROSCOPIC CHOLECYSTECTOMY;  Surgeon: Franky Macho, MD;  Location: [facility];  Service: General;  Laterality: N/A;  . TUBAL LIGATION      Prior to Admission medications   Medication Sig Start Date End Date Taking? Authorizing Provider  acetaminophen-codeine (TYLENOL #3) 300-30 MG tablet Take 1-2 tablets  by mouth every 6 (six) hours as needed for moderate pain. 05/18/16   Ivery Quale, PA-C  albuterol (PROVENTIL HFA;VENTOLIN HFA) 108 (90 Base) MCG/ACT inhaler Inhale 2 puffs into the lungs every 6 (six) hours as needed for wheezing or shortness of breath. 09/19/17   Nita Sickle, MD  clindamycin (CLEOCIN) 150 MG capsule Take 1 capsule (150 mg total) by mouth every 6 (six) hours. 05/18/16   Ivery Quale, PA-C  doxycycline (VIBRAMYCIN) 100 MG capsule Take 1 capsule (100 mg total) by mouth 2 (two) times daily for 10 days. 09/19/17 09/29/17  Nita Sickle, MD  ibuprofen (ADVIL,MOTRIN) 600 MG tablet Take 1 tablet (600 mg total) by mouth every 6 (six) hours as needed. 03/21/17   Joy, Shawn C, PA-C  lidocaine (LIDODERM) 5 % Place 1 patch onto the skin daily. Remove & Discard patch within 12 hours or as directed by MD 03/21/17   Joy, Shawn C, PA-C  methocarbamol (ROBAXIN) 500 MG tablet Take 1 tablet (500 mg total) by mouth 2 (two) times daily. 03/21/17   Joy, Shawn C, PA-C  methocarbamol (ROBAXIN-750) 750 MG tablet Take 1 tablet (750 mg total) by mouth 4 (four) times daily. 09/04/17   Joni Reining, PA-C  methylPREDNISolone (MEDROL DOSEPAK) 4 MG TBPK tablet Take Tapered dose as directed 09/04/17   Joni Reining, PA-C  predniSONE (DELTASONE) 20 MG tablet Take 3 tablets (60 mg total) by mouth daily for 4 days. 09/19/17 09/23/17  Nita Sickle, MD  Allergies Mango flavor; Penicillins; Tramadol; Zofran [ondansetron hcl]; Bactrim [sulfamethoxazole-trimethoprim]; Keflex [cephalexin]; and Toradol [ketorolac tromethamine]  Family History  Problem Relation Age of Onset  . Cancer Mother   . Diabetes Mother   . Colon cancer Father        AFTER AGE 26  . Crohn's disease Sister     Social History Social History   Tobacco Use  . Smoking status: Never Smoker  . Smokeless tobacco: Never Used  Substance Use Topics  . Alcohol use: No  . Drug use: No    Review of Systems  Constitutional: +  fever. Eyes: Negative for visual changes. ENT: Negative for sore throat. Neck: No neck pain  Cardiovascular: + chest pain. Respiratory: + shortness of breath, cough, wheezing Gastrointestinal: Negative for abdominal pain, vomiting. +diarrhea. Genitourinary: Negative for dysuria. Musculoskeletal: Negative for back pain. Skin: Negative for rash. Neurological: Negative for headaches, weakness or numbness. Psych: No SI or HI  ____________________________________________   PHYSICAL EXAM:  VITAL SIGNS: ED Triage Vitals  Enc Vitals Group     BP 09/19/17 1127 121/86     Pulse Rate 09/19/17 1122 80     Resp 09/19/17 1122 16     Temp 09/19/17 1122 98.1 F (36.7 C)     Temp Source 09/19/17 1122 Oral     SpO2 09/19/17 1122 96 %     Weight 09/19/17 1128 180 lb (81.6 kg)     Height 09/19/17 1128 5' (1.524 m)     Head Circumference --      Peak Flow --      Pain Score --      Pain Loc --      Pain Edu? --      Excl. in GC? --     Constitutional: Alert and oriented. Well appearing and in no apparent distress. HEENT:      Head: Normocephalic and atraumatic.         Eyes: Conjunctivae are normal. Sclera is non-icteric.       Mouth/Throat: Mucous membranes are moist.       Neck: Supple with no signs of meningismus. Cardiovascular: Regular rate and rhythm. No murmurs, gallops, or rubs. 2+ symmetrical distal pulses are present in all extremities. No JVD. Respiratory: Normal respiratory effort. normal sats, course rhonchi and expiratory wheezing bilaterally Gastrointestinal: Soft, non tender, and non distended with positive bowel sounds. No rebound or guarding. Musculoskeletal: Nontender with normal range of motion in all extremities. No edema, cyanosis, or erythema of extremities. Neurologic: Normal speech and language. Face is symmetric. Moving all extremities. No gross focal neurologic deficits are appreciated. Skin: Skin is warm, dry and intact. No rash noted. Psychiatric: Mood and  affect are normal. Speech and behavior are normal.  ____________________________________________   LABS (all labs ordered are listed, but only abnormal results are displayed)  Labs Reviewed  BASIC METABOLIC PANEL - Abnormal; Notable for the following components:      Result Value   Potassium 3.4 (*)    Glucose, Bld 103 (*)    All other components within normal limits  CBC WITH DIFFERENTIAL/PLATELET  INFLUENZA PANEL BY PCR (TYPE A & B)   ____________________________________________  EKG  ED ECG REPORT I, Nita Sickle, the attending physician, personally viewed and interpreted this ECG.  Normal sinus rhythm, rate of 80, normal intervals, normal axis, no ST elevations or depressions. unchanged from prior from September 2017 ____________________________________________  RADIOLOGY  CXR: Mild bronchitic changes ____________________________________________   PROCEDURES  Procedure(s) performed: None  Procedures Critical Care performed:  None ____________________________________________   INITIAL IMPRESSION / ASSESSMENT AND PLAN / ED COURSE  47 y.o. female with a history of RA, and asthma who presents for evaluation of productive cough, fever, wheezing, SOB, chest tightness, and diarrhea x 2 days. patient is well-appearing, in no distress, normal work of breathing, normal sats, she does have coarse rhonchi and diffuse expiratory wheezes throughout. Chest x-ray does not show an infiltrate. Presentation concerning for bronchitis versus flu. Flu swab is pending. We'll check basic labs, give patient 3 duo nebs, Solu-Medrol, and doxycycline.   ED COURSE: flu negative, patient feels improved after 3 Duonebs. Normal sats at rest and with ambulation. She will be discharged home on doxycycline, albuterol, and prednisone. Discussed return precautions.   As part of my medical decision making, I reviewed the following data within the electronic MEDICAL RECORD NUMBER Nursing notes reviewed  and incorporated, Labs reviewed , EKG interpreted , Old EKG reviewed, Radiograph reviewed , Notes from prior ED visits and [place] Controlled Substance Database    Pertinent labs & imaging results that were available during my care of the patient were reviewed by me and considered in my medical decision making (see chart for details).    ____________________________________________   FINAL CLINICAL IMPRESSION(S) / ED DIAGNOSES  Final diagnoses:  Bronchitis      NEW MEDICATIONS STARTED DURING THIS VISIT:  ED Discharge Orders        Ordered    doxycycline (VIBRAMYCIN) 100 MG capsule  2 times daily     09/19/17 1424    predniSONE (DELTASONE) 20 MG tablet  Daily     09/19/17 1424    albuterol (PROVENTIL HFA;VENTOLIN HFA) 108 (90 Base) MCG/ACT inhaler  Every 6 hours PRN     09/19/17 1424       Note:  This document was prepared using Dragon voice recognition software and may include unintentional dictation errors.    Nita Sickle, MD 09/19/17 [phone]

## 2017-09-19 NOTE — ED Triage Notes (Signed)
First Nurse Note:  Arrives with C/O SOB, Fever, and upper back pain x 3 days.  Tylenol last taken this morning at around 0900.  Patient is AAOx3.  Skin warm and dry. No SOB/ DOE noted.  Ambulates with easy and steady gait.

## 2017-09-19 NOTE — ED Notes (Signed)
Pt brought back to room 12. C/o cold symptoms and chest hurting with coughing. Nonfebrile at this time.

## 2017-09-19 NOTE — ED Notes (Signed)
Pt ambulated around the ed and maintained her sp02 at 99-100%

## 2017-09-19 NOTE — ED Triage Notes (Signed)
Pt reports she has been coughing a lot for the past 2 days, reports took cough medicine but no relief, reports fever at home 102F reports took Tylenol 0900, pt talks in complete sentences no distress noted

## 2019-09-08 HISTORY — PX: EXCISION, ZENKER'S DIVERTICULUM: SHX7175

## 2019-09-11 ENCOUNTER — Other Ambulatory Visit: Payer: Self-pay

## 2019-09-11 ENCOUNTER — Ambulatory Visit (HOSPITAL_COMMUNITY): Payer: Medicare Other | Attending: Orthopedic Surgery | Admitting: Physical Therapy

## 2019-09-11 DIAGNOSIS — R262 Difficulty in walking, not elsewhere classified: Secondary | ICD-10-CM | POA: Insufficient documentation

## 2019-09-11 DIAGNOSIS — M5442 Lumbago with sciatica, left side: Secondary | ICD-10-CM | POA: Diagnosis present

## 2019-09-11 DIAGNOSIS — M5441 Lumbago with sciatica, right side: Secondary | ICD-10-CM | POA: Insufficient documentation

## 2019-09-11 DIAGNOSIS — M6281 Muscle weakness (generalized): Secondary | ICD-10-CM | POA: Insufficient documentation

## 2019-09-11 DIAGNOSIS — G8929 Other chronic pain: Secondary | ICD-10-CM | POA: Insufficient documentation

## 2019-09-11 NOTE — Therapy (Signed)
[facility] [address] [place], [place], [address] Phone: [phone]   Fax:  [phone]  Physical Therapy Evaluation  Patient Details  Name: KELSA JAWOROWSKI MRN: [id number] Date of Birth: [birth year] Referring Provider (PT): Crissie Figures   Encounter Date: 09/11/2019  PT End of Session - 09/11/19 1347    Visit Number  1    Number of Visits  12    Date for PT Re-Evaluation  10/23/19    Authorization Type  Medicare with medicaid secondary    Authorization Time Period  09/11/19 to 10/23/2019    Authorization - Visit Number  1    Authorization - Number of Visits  10    PT Start Time  1350    PT Stop Time  1430    PT Time Calculation (min)  40 min    Activity Tolerance  Patient limited by fatigue;Patient limited by pain    Behavior During Therapy  [facility] for tasks assessed/performed       Past Medical History:  Diagnosis Date  . C. difficile diarrhea   . Cervical radiculopathy   . Chronic abdominal pain   . Chronic back pain   . Chronic knee pain   . Chronic neck pain   . Diabetes mellitus without complication (HCC)    diet controlled  . Fibromyalgia   . Hypertension   . Rheumatoid arthritis (HCC)    "Rhematoid factor positive but no symptoms" per EPIC notes 2012  . Rib pain on right side    chronic  . Sciatic pain    right    Past Surgical History:  Procedure Laterality Date  . BLADDER SURGERY    . CHOLECYSTECTOMY N/A 10/18/2015   Procedure: LAPAROSCOPIC CHOLECYSTECTOMY;  Surgeon: Franky Macho, MD;  Location: [facility];  Service: General;  Laterality: N/A;  . TUBAL LIGATION      There were no vitals filed for this visit.   Subjective Assessment - 09/11/19 1347    Subjective  Patient complains of chronic low back pain that has been present for years. States last year she finally did something about it as the frequency started to increase. Reports that she elected to have lumbar surgery in September 2020 and prior to that she did  a brief bout of physical therapy. States that her symptoms were completely unchanged by the surgery. States she still has back pain that radiates into her buttocks and then into both legs down the backs and sides. States that her symptoms in her legs can increase with movement, but she also has symptoms at rest. States the MD is talking about putting rods in her back and she is trying physical therapy first. States she can't lay flat on her bed and needs to be at a 45 degree incline to have any sort of comfort. Reports she also can't lay on her side or on her stomach. Pain is pretty constant and she takes a lot of meds for her back pain but also for her RA. In addition she has a three holes in her urethra and is incontinent at times. She has also had a UTI for the last 4 months that her doctors can't get rid of.    Pertinent History  lumbar decompression 05/20/19, fibro, rheumatoid arthritis    Limitations  Lifting;Standing;Walking;House hold activities    How long can you sit comfortably?  depends    How long can you stand comfortably?  depends on the day - not long  How long can you walk comfortably?  depends on the day - not far    Patient Stated Goals  to have less pain    Currently in Pain?  Yes    Pain Score  8     Pain Location  Back    Pain Orientation  Right;Left;Lower    Pain Descriptors / Indicators  Aching;Shooting;Sharp;Tingling    Pain Type  Chronic pain    Pain Radiating Towards  down bilateral lower legs (back sides and down to feet - occassionally into feet    Pain Onset  Today    Pain Frequency  Constant    Aggravating Factors   walking, standing, supine/prone position    Pain Relieving Factors  laying at 45 degree ankle    Effect of Pain on Daily Activities  patient able to perform  but pushes through pain         [facility] PT Assessment - 09/11/19 0001      Assessment   Medical Diagnosis  LBP    Referring Provider (PT)  Noreene Larsson      Balance Screen   Has the patient  fallen in the past 6 months  No    Has the patient had a decrease in activity level because of a fear of falling?   Yes    Is the patient reluctant to leave their home because of a fear of falling?   No      Home Environment   Living Environment  Private residence    Living Arrangements  Children    Available Help at Discharge  Family    Type of [place] to enter    Entrance Stairs-Number of Steps  [place]  One level    [place] - 2 wheels      Prior Function   Level of Independence  Independent    Vocation  --   not currently working     Cognition   Overall Cognitive Status  Within Functional Limits for tasks assessed      Observation/Other Assessments   Focus on Therapeutic Outcomes (FOTO)   62% limited      ROM / Strength   AROM / PROM / Strength  AROM;Strength      AROM   AROM Assessment Site  Lumbar;Hip    Lumbar Flexion  75% limited   pulling on legs and back   Lumbar Extension  75% limited   relieved pressure   Lumbar - Right Side Bend  75% limited   ipsilateral butt symptoms   Lumbar - Left Side Bend  75% limited   ipsilateral butt symptoms   Lumbar - Right Rotation  75% limited    Lumbar - Left Rotation  75% limited       Strength   Strength Assessment Site  Hip;Knee;Ankle    Right/Left Hip  Right;Left    Right Hip Flexion  4-/5   painin ipsilateral buttocks   Left Hip Flexion  4-/5   painin ipsilateral buttocks   Right/Left Knee  Right;Left    Right Knee Flexion  4/5    Right Knee Extension  4+/5    Left Knee Flexion  4-/5   pulling in back on left side   Left Knee Extension  4/5   pain in the buttocks   Right/Left Ankle  Right;Left    Right Ankle  Dorsiflexion  4/5    Left Ankle Dorsiflexion  4-/5      Palpation   Spinal mobility  unable to assess - patient could not tolerate prone position    Palpation comment  tenderness to palpation in lumbar region  bilaterally - did not assess glutes      Special Tests   Other special tests  slump test + bilaterally      Transfers   Transfers  Sit to Stand;Stand to Sit    Sit to Stand  7: Independent    Stand to Sit  7: Independent      Ambulation/Gait   Ambulation/Gait  Yes    Ambulation/Gait Assistance  7: Independent    Ambulation Distance (Feet)  372 Feet    Assistive device  None    Gait Pattern  Decreased step length - right;Decreased step length - left;Wide base of support;Trunk flexed    Gait velocity  decreased    Gait Comments  - tingling started at 1 minute                Objective measurements completed on examination: See above findings.      [facility] Adult PT Treatment/Exercise - 09/11/19 0001      Exercises   Exercises  Lumbar      Lumbar Exercises: Stretches   Standing Extension  10 reps;5 seconds   at wall   Other Lumbar Stretch Exercise  seated lumbar flexion x10, 5" holds              PT Education - 09/11/19 1446    Education Details  on current condition, movement and how it can change symptoms. About anticipated gradual change secondary to chronicity of condition    Person(s) Educated  Patient    Methods  Explanation    Comprehension  Verbalized understanding       PT Short Term Goals - 09/11/19 1444      PT SHORT TERM GOAL #1   Title  Patient will be independent in HEP to improve functional outcomes.    Time  3    Period  Weeks    Status  New    Target Date  10/02/19      PT SHORT TERM GOAL #2   Title  Patient will be able to lay in supine with < 25 degrees of elevation in the head of the bed to improve supine tolerance.    Baseline  need 45 degrees    Time  3    Period  Weeks    Status  New    Target Date  10/02/19      PT SHORT TERM GOAL #3   Title  Patient will be able to lay prone for 5 minutes to improve tolerance to prone position.    Baseline  < 10 seconds    Time  3    Period  Weeks    Status  New    Target Date   10/02/19        PT Long Term Goals - 09/11/19 1445      PT LONG TERM GOAL #1   Title  Patient will score with < 55% limitation on FOTO to demonstrate improved functional mobility.    Time  6    Period  Weeks    Status  New    Target Date  10/23/19      PT LONG TERM GOAL #2   Title  Patient will be able to  report at least 25% improvement in either symptoms or overall functional mobility.    Time  6    Period  Weeks    Status  New    Target Date  10/23/19      PT LONG TERM GOAL #3   Time  --    Period  --    Status  --    Target Date  --             Plan - 09/11/19 1550    Clinical Impression Statement  Patient presents with chronic low back pain with bilateral radicular symptoms down her legs that started years ago. Last year she underwent a lumbar decompression and had no change in symptoms in her legs or back. Patient did not tolerate session well as she is currently unable to lay on her side, back or stomach secondary to pain and muscle spasms. Patient did have minor relief with lumbar extension but after 10 reps, she noted increase in tingling symptoms, but patient noted that tingling comes on and off with most movements. Patient would benefit from skilled physical therapy to improve functional mobility and tolerance to different positions. Educated patient on current presentation and goals moving forward.    Examination-Activity Limitations  Bed Mobility;Caring for Others;Carry;Continence;Lift;Locomotion Level;Reach Overhead;Transfers;Sleep;Squat;Sit    Examination-Participation Restrictions  Cleaning;Community Activity;Driving;Meal Prep;Shop    Stability/Clinical Decision Making  Stable/Uncomplicated    Clinical Decision Making  Low    Rehab Potential  Good    PT Frequency  2x / week    PT Duration  6 weeks    PT Treatment/Interventions  ADLs/Self Care Home Management;Aquatic Therapy;Biofeedback;Cryotherapy;Electrical Stimulation;Moist Heat;Traction;Balance  training;Therapeutic exercise;Therapeutic activities;Functional mobility training;Stair training;Gait training;Neuromuscular re-education;Patient/family education;Manual techniques;Dry needling;Passive range of motion    PT Next Visit Plan  spinal mobility, traction (standing/seated), patient does not currently tolerate supine or prone, does tolerate laying with HOB at 45 degree    PT Home Exercise Plan  1/4 seated lumbar flexion, standing lumbar extension    Consulted and Agree with Plan of Care  Patient       Patient will benefit from skilled therapeutic intervention in order to improve the following deficits and impairments:  Decreased activity tolerance, Decreased balance, Decreased range of motion, Decreased safety awareness, Decreased mobility, Decreased strength, Difficulty walking, Pain  Visit Diagnosis: Chronic bilateral low back pain with bilateral sciatica  Difficulty in walking, not elsewhere classified  Muscle weakness (generalized)     Problem List Patient Active Problem List   Diagnosis Date Noted  . Cholecystitis with cholelithiasis 10/17/2015  . C. difficile colitis 04/19/2015  . Essential hypertension 04/05/2015  . Chronic pain syndrome 04/05/2015   3:56 PM, 09/11/19 Tereasa Coop, DPT Physical Therapy with [facility]  [phone] office  [facility] [address] [place], [place], [address] Phone: [phone]   Fax:  [phone]  Name: DRINDA BELGARD MRN: [id number] Date of Birth: Dec 04, 1970

## 2019-09-13 ENCOUNTER — Other Ambulatory Visit: Payer: Self-pay

## 2019-09-13 ENCOUNTER — Ambulatory Visit (HOSPITAL_COMMUNITY): Payer: Medicare Other | Admitting: Physical Therapy

## 2019-09-13 DIAGNOSIS — R262 Difficulty in walking, not elsewhere classified: Secondary | ICD-10-CM

## 2019-09-13 DIAGNOSIS — M5442 Lumbago with sciatica, left side: Secondary | ICD-10-CM | POA: Diagnosis not present

## 2019-09-13 DIAGNOSIS — M6281 Muscle weakness (generalized): Secondary | ICD-10-CM

## 2019-09-13 DIAGNOSIS — G8929 Other chronic pain: Secondary | ICD-10-CM

## 2019-09-13 NOTE — Therapy (Signed)
[facility] [address] [place], [place], [address] Phone: [phone]   Fax:  [phone]  Physical Therapy Treatment  Patient Details  Name: Regina Patton MRN: [id number] Date of Birth: 01/08/1971 Referring Provider (PT): Crissie Figures   Encounter Date: 09/13/2019  PT End of Session - 09/13/19 1102    Visit Number  2    Number of Visits  12    Date for PT Re-Evaluation  10/23/19    Authorization Type  Medicare with medicaid secondary    Authorization Time Period  09/11/19 to 10/23/2019    Authorization - Visit Number  2    Authorization - Number of Visits  10    PT Start Time  1030    PT Stop Time  1115    PT Time Calculation (min)  45 min    Activity Tolerance  Patient limited by fatigue;Patient limited by pain    Behavior During Therapy  [facility] for tasks assessed/performed       Past Medical History:  Diagnosis Date  . C. difficile diarrhea   . Cervical radiculopathy   . Chronic abdominal pain   . Chronic back pain   . Chronic knee pain   . Chronic neck pain   . Diabetes mellitus without complication (HCC)    diet controlled  . Fibromyalgia   . Hypertension   . Rheumatoid arthritis (HCC)    "Rhematoid factor positive but no symptoms" per EPIC notes 2012  . Rib pain on right side    chronic  . Sciatic pain    right    Past Surgical History:  Procedure Laterality Date  . BLADDER SURGERY    . CHOLECYSTECTOMY N/A 10/18/2015   Procedure: LAPAROSCOPIC CHOLECYSTECTOMY;  Surgeon: Franky Macho, MD;  Location: [facility];  Service: General;  Laterality: N/A;  . TUBAL LIGATION      There were no vitals filed for this visit.                    [facility] Adult PT Treatment/Exercise - 09/13/19 0001      Lumbar Exercises: Stretches   Active Hamstring Stretch  Right;Left;3 reps;30 seconds    Active Hamstring Stretch Limitations  supine with towel    Standing Extension  10 reps;5 seconds    Standing Extension Limitations   3 D hip excursion    Piriformis Stretch  Right;Left;3 reps;30 seconds    Piriformis Stretch Limitations  seated      Lumbar Exercises: Supine   Other Supine Lumbar Exercises  decompression exercises 1-5 5 reps each, 5" holds             PT Education - 09/13/19 1102    Education Details  reveiwed HEP and added hip excursions and decompression exercises.  Reveiwed goals and POC moving forward.  Educated on importance of stretching and usign abdominal mm to stabilize and protect back.    Person(s) Educated  Patient    Methods  Explanation    Comprehension  Verbalized understanding       PT Short Term Goals - 09/13/19 1105      PT SHORT TERM GOAL #1   Title  Patient will be independent in HEP to improve functional outcomes.    Time  3    Period  Weeks    Status  On-going    Target Date  10/02/19      PT SHORT TERM GOAL #2   Title  Patient will be  able to lay in supine with < 25 degrees of elevation in the head of the bed to improve supine tolerance.    Baseline  need 45 degrees    Time  3    Period  Weeks    Status  On-going    Target Date  10/02/19      PT SHORT TERM GOAL #3   Title  Patient will be able to lay prone for 5 minutes to improve tolerance to prone position.    Baseline  < 10 seconds    Time  3    Period  Weeks    Status  On-going    Target Date  10/02/19        PT Long Term Goals - 09/13/19 1107      PT LONG TERM GOAL #1   Title  Patient will score with < 55% limitation on FOTO to demonstrate improved functional mobility.    Time  6    Period  Weeks    Status  On-going      PT LONG TERM GOAL #2   Title  Patient will be able to report at least 25% improvement in either symptoms or overall functional mobility.    Time  6    Period  Weeks    Status  On-going            Plan - 09/13/19 1116    Clinical Impression Statement  REviewed goals and POC moving forward.  Initiated lumbar ROM exercises in standing, decompressio exercises and LE  stretches.  Noted tightness in hamstrings (Lt>Rt), minimal tightness in piriformis. Pt able to complete all exercises without c/o pain and tolerated supine to complete decompression exercises with LE's bent up.  Pt given those 2 sets of exercises to complete at home and instructed to hold off on forward flexion as this causes increased pain.    Examination-Activity Limitations  Bed Mobility;Caring for Others;Carry;Continence;Lift;Locomotion Level;Reach Overhead;Transfers;Sleep;Squat;Sit    Examination-Participation Restrictions  Cleaning;Community Activity;Driving;Meal Prep;Shop    Stability/Clinical Decision Making  Stable/Uncomplicated    Rehab Potential  Good    PT Frequency  2x / week    PT Duration  6 weeks    PT Treatment/Interventions  ADLs/Self Care Home Management;Aquatic Therapy;Biofeedback;Cryotherapy;Electrical Stimulation;Moist Heat;Traction;Balance training;Therapeutic exercise;Therapeutic activities;Functional mobility training;Stair training;Gait training;Neuromuscular re-education;Patient/family education;Manual techniques;Dry needling;Passive range of motion    PT Next Visit Plan  spinal mobility, traction (standing/seated), patient does not currently tolerate supine or prone, does tolerate laying with HOB at 45 degree.  Continue to progress LE strength, mobiltiuy and core stability to reduce symptoms.    PT Home Exercise Plan  1/4 seated lumbar flexion, standing lumbar extension  09/13/19:  decompression 1-5, hip excursions    Consulted and Agree with Plan of Care  Patient       Patient will benefit from skilled therapeutic intervention in order to improve the following deficits and impairments:  Decreased activity tolerance, Decreased balance, Decreased range of motion, Decreased safety awareness, Decreased mobility, Decreased strength, Difficulty walking, Pain  Visit Diagnosis: Difficulty in walking, not elsewhere classified  Muscle weakness (generalized)  Chronic bilateral  low back pain with bilateral sciatica     Problem List Patient Active Problem List   Diagnosis Date Noted  . Cholecystitis with cholelithiasis 10/17/2015  . C. difficile colitis 04/19/2015  . Essential hypertension 04/05/2015  . Chronic pain syndrome 04/05/2015    Roseanne Reno B 09/13/2019, 11:24 AM  [facility]  [address] [place], [place], [address] Phone: [phone]   Fax:  [phone]  Name: BAYLE CALVO MRN: [id number] Date of Birth: 07-Aug-1971

## 2019-09-19 ENCOUNTER — Ambulatory Visit (HOSPITAL_COMMUNITY): Payer: Medicare Other | Admitting: Physical Therapy

## 2019-09-19 ENCOUNTER — Encounter (HOSPITAL_COMMUNITY): Payer: Self-pay | Admitting: Physical Therapy

## 2019-09-19 ENCOUNTER — Other Ambulatory Visit: Payer: Self-pay

## 2019-09-19 DIAGNOSIS — G8929 Other chronic pain: Secondary | ICD-10-CM

## 2019-09-19 DIAGNOSIS — M6281 Muscle weakness (generalized): Secondary | ICD-10-CM

## 2019-09-19 DIAGNOSIS — R262 Difficulty in walking, not elsewhere classified: Secondary | ICD-10-CM

## 2019-09-19 DIAGNOSIS — M5442 Lumbago with sciatica, left side: Secondary | ICD-10-CM

## 2019-09-19 NOTE — Therapy (Signed)
[facility] [address] [place], [place], [address] Phone: [phone]   Fax:  [phone]  Physical Therapy Treatment  Patient Details  Name: Regina Patton MRN: [id number] Date of Birth: 1971/09/04 Referring Provider (PT): Crissie Figures   Encounter Date: 09/19/2019  PT End of Session - 09/19/19 1421    Visit Number  3    Number of Visits  12    Date for PT Re-Evaluation  10/23/19    Authorization Type  Medicare with medicaid secondary    Authorization Time Period  09/11/19 to 10/23/2019    Authorization - Visit Number  3    Authorization - Number of Visits  10    PT Start Time  1359    PT Stop Time  1423    PT Time Calculation (min)  24 min    Activity Tolerance  Patient limited by fatigue;Patient limited by pain    Behavior During Therapy  [facility] for tasks assessed/performed       Past Medical History:  Diagnosis Date  . C. difficile diarrhea   . Cervical radiculopathy   . Chronic abdominal pain   . Chronic back pain   . Chronic knee pain   . Chronic neck pain   . Diabetes mellitus without complication (HCC)    diet controlled  . Fibromyalgia   . Hypertension   . Rheumatoid arthritis (HCC)    "Rhematoid factor positive but no symptoms" per EPIC notes 2012  . Rib pain on right side    chronic  . Sciatic pain    right    Past Surgical History:  Procedure Laterality Date  . BLADDER SURGERY    . CHOLECYSTECTOMY N/A 10/18/2015   Procedure: LAPAROSCOPIC CHOLECYSTECTOMY;  Surgeon: Franky Macho, MD;  Location: [facility];  Service: General;  Laterality: N/A;  . TUBAL LIGATION      There were no vitals filed for this visit.  Subjective Assessment - 09/19/19 1401    Subjective  Has been in terrible pain. States did not sleep last night, did not take medication today yet because she had therapy. Throbbing pain in back. Attempted exercises with no relief.    Pertinent History  lumbar decompression 05/20/19, fibro, rheumatoid arthritis     Limitations  Lifting;Standing;Walking;House hold activities    How long can you sit comfortably?  depends    How long can you stand comfortably?  depends on the day - not long    How long can you walk comfortably?  depends on the day - not far    Patient Stated Goals  to have less pain    Currently in Pain?  Yes    Pain Score  9     Pain Location  Back    Pain Orientation  Right;Left;Lower    Pain Descriptors / Indicators  Throbbing    Pain Radiating Towards  of and on going down into legs.    Pain Onset  Today         [facility] PT Assessment - 09/19/19 0001      Assessment   Medical Diagnosis  LBP    Referring Provider (PT)  Crissie Figures                   [facility] Adult PT Treatment/Exercise - 09/19/19 0001      Lumbar Exercises: Seated   Other Seated Lumbar Exercises  5 in/out breathing in seated position - 3x5      Modalities  Modalities  Moist Heat      Moist Heat Therapy   Number Minutes Moist Heat  15 Minutes    Moist Heat Location  Lumbar Spine   in seated position with pillow    reviewed HEP and different exercises - standing extension did not improve symptoms, seated lumbar flexion and extension increased pain. Did not tolerate gentle STM        PT Education - 09/19/19 1412    Education Details  on breathing exercises, on how it helps down regulate CNS.    Person(s) Educated  Patient    Methods  Explanation    Comprehension  Verbalized understanding       PT Short Term Goals - 09/13/19 1105      PT SHORT TERM GOAL #1   Title  Patient will be independent in HEP to improve functional outcomes.    Time  3    Period  Weeks    Status  On-going    Target Date  10/02/19      PT SHORT TERM GOAL #2   Title  Patient will be able to lay in supine with < 25 degrees of elevation in the head of the bed to improve supine tolerance.    Baseline  need 45 degrees    Time  3    Period  Weeks    Status  On-going    Target Date  10/02/19      PT SHORT  TERM GOAL #3   Title  Patient will be able to lay prone for 5 minutes to improve tolerance to prone position.    Baseline  < 10 seconds    Time  3    Period  Weeks    Status  On-going    Target Date  10/02/19        PT Long Term Goals - 09/13/19 1107      PT LONG TERM GOAL #1   Title  Patient will score with < 55% limitation on FOTO to demonstrate improved functional mobility.    Time  6    Period  Weeks    Status  On-going      PT LONG TERM GOAL #2   Title  Patient will be able to report at least 25% improvement in either symptoms or overall functional mobility.    Time  6    Period  Weeks    Status  On-going            Plan - 09/19/19 1425    Clinical Impression Statement  Patient arrived to session with high pain levels and reports of not knowing what she will tolerate. Educated patient in breathing at 5 second in and out counts and educated patient on how this could help with pain, resting tension and improved sleep. Patient more relaxed after breathing but reported continued pain. Attempted some lumbar ROM exercises but all increased patient's symptoms. Patient reporting she felt like she just needed to sleep and session was terminated early secondary to patient request.    Examination-Activity Limitations  Bed Mobility;Caring for Others;Carry;Continence;Lift;Locomotion Level;Reach Overhead;Transfers;Sleep;Squat;Sit    Examination-Participation Restrictions  Cleaning;Community Activity;Driving;Meal Prep;Shop    Stability/Clinical Decision Making  Stable/Uncomplicated    Rehab Potential  Good    PT Frequency  2x / week    PT Duration  6 weeks    PT Treatment/Interventions  ADLs/Self Care Home Management;Aquatic Therapy;Biofeedback;Cryotherapy;Electrical Stimulation;Moist Heat;Traction;Balance training;Therapeutic exercise;Therapeutic activities;Functional mobility training;Stair training;Gait training;Neuromuscular re-education;Patient/family education;Manual  techniques;Dry needling;Passive range  of motion    PT Next Visit Plan  spinal mobility, traction (standing/seated), patient does not currently tolerate supine or prone, does tolerate laying with HOB at 45 degree.  Continue to progress LE strength, mobiltiuy and core stability to reduce symptoms.    PT Home Exercise Plan  1/4 seated lumbar flexion, standing lumbar extension  09/13/19:  decompression 1-5, hip excursions    Consulted and Agree with Plan of Care  Patient       Patient will benefit from skilled therapeutic intervention in order to improve the following deficits and impairments:  Decreased activity tolerance, Decreased balance, Decreased range of motion, Decreased safety awareness, Decreased mobility, Decreased strength, Difficulty walking, Pain  Visit Diagnosis: Difficulty in walking, not elsewhere classified  Muscle weakness (generalized)  Chronic bilateral low back pain with bilateral sciatica     Problem List Patient Active Problem List   Diagnosis Date Noted  . Cholecystitis with cholelithiasis 10/17/2015  . C. difficile colitis 04/19/2015  . Essential hypertension 04/05/2015  . Chronic pain syndrome 04/05/2015   2:25 PM, 09/19/19 Tereasa Coop, DPT Physical Therapy with [facility]  [phone] office  [facility] [address] [place], [place], [address] Phone: [phone]   Fax:  [phone]  Name: Regina Patton MRN: [id number] Date of Birth: 08-24-1971

## 2019-09-21 ENCOUNTER — Ambulatory Visit (HOSPITAL_COMMUNITY): Payer: Medicare Other | Admitting: Physical Therapy

## 2019-09-25 ENCOUNTER — Ambulatory Visit (HOSPITAL_COMMUNITY): Payer: Medicare Other | Admitting: Physical Therapy

## 2019-09-25 ENCOUNTER — Telehealth (HOSPITAL_COMMUNITY): Payer: Self-pay | Admitting: Physical Therapy

## 2019-09-25 NOTE — Telephone Encounter (Signed)
No Show #1. Called patient about missed visits and patient has been unwell in bed all day (due to kidney/bladder issues). Awaiting call back from MD. Will call to cancel next appointment if she is unable to make it.   4:38 PM, 09/25/19 Tereasa Coop, DPT Physical Therapy with [facility]  [phone] office

## 2019-09-26 NOTE — Addendum Note (Signed)
Addended by: Tereasa Coop R on: 09/26/2019 03:47 PM   Modules accepted: Orders

## 2019-09-27 ENCOUNTER — Telehealth (HOSPITAL_COMMUNITY): Payer: Self-pay | Admitting: Physical Therapy

## 2019-09-27 ENCOUNTER — Ambulatory Visit (HOSPITAL_COMMUNITY): Payer: Medicare Other | Admitting: Physical Therapy

## 2019-09-27 NOTE — Telephone Encounter (Signed)
No Show #2. Called patient and talked to patient about cancellation and no show policy. Patient verbalized understanding and stated she was waiting to hear back from [facility] about an appointment as she is still not feeling well. Instructed patient to call and cancel appointment if she cannot make it.  4:43 PM, 09/27/19 Tereasa Coop, DPT Physical Therapy with [facility]  [phone] office

## 2019-10-02 ENCOUNTER — Ambulatory Visit (HOSPITAL_COMMUNITY): Payer: Medicare Other | Admitting: Physical Therapy

## 2019-10-09 ENCOUNTER — Encounter (HOSPITAL_COMMUNITY): Payer: Self-pay

## 2019-10-09 ENCOUNTER — Telehealth (HOSPITAL_COMMUNITY): Payer: Self-pay

## 2019-10-09 ENCOUNTER — Ambulatory Visit (HOSPITAL_COMMUNITY): Payer: Medicare Other | Attending: Orthopedic Surgery

## 2019-10-09 NOTE — Telephone Encounter (Signed)
No Show#3. Therapist called pt regarding missed appointment today at 4:15. Pt reports she previously told the therapy office to cancel all her appointments because she is having bladder issues and is receiving PT at a different facility in [place] to address them. Therapist asked pt if she wanted to cancel all remaining appointments and d/c from therapy at this time and pt agreed.  Domenick Bookbinder PT, DPT 10/09/19, 4:33 PM [phone]

## 2019-10-09 NOTE — Therapy (Signed)
[place], [place], [address] Phone: [phone]   Fax:  [phone]  Patient Details  Name: Regina Patton MRN: [id number] Date of Birth: 28-Sep-1970 Referring Provider:  No ref. provider found  Encounter Date: 10/09/2019  PHYSICAL THERAPY DISCHARGE SUMMARY  Visits from Start of Care: 3  Current functional level related to goals / functional outcomes: See last treatment note   Remaining deficits: See last treatment note   Education / Equipment: See last treatment note Plan: Patient agrees to discharge.  Patient goals were not met. Patient is being discharged due to the patient's request.  ?????     Pt with 3 consecutive No Shows. Therapist called pt regarding missing appointment 10/09/19 at 4:15 and pt reports she is having bladder issues and is receiving PT at facility in [place] to address them. Therapist asks pt if she wants all remaining appointments cancelled and to be discharged and pt agrees.   Talbot Grumbling PT, DPT 10/09/19, 4:36 PM [place] [address] [place], [place], [address] Phone: [phone]   Fax:  [phone]

## 2019-10-11 ENCOUNTER — Ambulatory Visit (HOSPITAL_COMMUNITY): Payer: Medicare Other

## 2019-10-16 ENCOUNTER — Ambulatory Visit (HOSPITAL_COMMUNITY): Payer: Medicare Other | Admitting: Physical Therapy

## 2019-10-18 ENCOUNTER — Encounter (HOSPITAL_COMMUNITY): Payer: Medicare Other | Admitting: Physical Therapy

## 2020-05-09 ENCOUNTER — Other Ambulatory Visit: Payer: Self-pay

## 2020-05-09 ENCOUNTER — Encounter: Payer: Self-pay | Admitting: Emergency Medicine

## 2020-05-09 ENCOUNTER — Ambulatory Visit
Admission: EM | Admit: 2020-05-09 | Discharge: 2020-05-09 | Disposition: A | Discharge status: Home / Self Care | Payer: Medicare Other | Attending: Emergency Medicine | Admitting: Emergency Medicine

## 2020-05-09 DIAGNOSIS — R112 Nausea with vomiting, unspecified: Secondary | ICD-10-CM

## 2020-05-09 DIAGNOSIS — Z1152 Encounter for screening for COVID-19: Secondary | ICD-10-CM | POA: Diagnosis not present

## 2020-05-09 DIAGNOSIS — R52 Pain, unspecified: Secondary | ICD-10-CM | POA: Diagnosis not present

## 2020-05-09 MED ORDER — ONDANSETRON 4 MG PO TBDP: 4.0000 mg | ORAL_TABLET | Freq: Three times a day (TID) | ORAL | 0 refills | Status: DC | PRN

## 2020-05-09 NOTE — Discharge Instructions (Signed)
COVID-19 test will take 2 to 7 days for results to return.  Someone will call if your result is abnormal  Get rest and drink fluids Zofran prescribed.  Take as directed.    DIET Instructions:  30 minutes after taking nausea medicine, begin with sips of clear liquids. If able to hold down 2 - 4 ounces for 30 minutes, begin drinking more. Increase your fluid intake to replace losses. Clear liquids only for 24 hours (water, tea, sport drinks, clear flat ginger ale or cola and juices, broth, jello, popsicles, ect). Advance to bland foods, applesauce, rice, baked or boiled chicken, ect. Avoid milk, greasy foods and anything that doesn't agree with you.  If you experience new or worsening symptoms return or go to ER such as fever, chills, nausea, vomiting, diarrhea, bloody or dark tarry stools, constipation, urinary symptoms, worsening abdominal discomfort, symptoms that do not improve with medications, inability to keep fluids down, etc..

## 2020-05-09 NOTE — ED Provider Notes (Signed)
[facility]   [id number] 05/09/20 Arrival Time: 1343  CC: ABDOMINAL DISCOMFORT  SUBJECTIVE:  Regina Patton is a 49 y.o. female who presented to the urgent care for complaint of nausea, vomiting and diarrhea and body aches for the past 3 days.  Reported she is taking tetracycline for dental abscess.  Denies a precipitating event, trauma, close contacts with similar symptoms.   Has tried OTC medications without relief.  Denies alleviating or aggravating factors.  Denies similar symptoms in the past.  Last BM on 10/27/2018 when it was soft..       No LMP recorded. (Menstrual status: Irregular Periods).  ROS: As per HPI.  All other pertinent ROS negative.     Past Medical History:  Diagnosis Date  . C. difficile diarrhea   . Cervical radiculopathy   . Chronic abdominal pain   . Chronic back pain   . Chronic knee pain   . Chronic neck pain   . Diabetes mellitus without complication (HCC)    diet controlled  . Fibromyalgia   . Hypertension   . Rheumatoid arthritis (HCC)    "Rhematoid factor positive but no symptoms" per EPIC notes 2012  . Rib pain on right side    chronic  . Sciatic pain    right   Past Surgical History:  Procedure Laterality Date  . BLADDER SURGERY    . CHOLECYSTECTOMY N/A 10/18/2015   Procedure: LAPAROSCOPIC CHOLECYSTECTOMY;  Surgeon: Franky Macho, MD;  Location: [facility];  Service: General;  Laterality: N/A;  . TUBAL LIGATION     Allergies  Allergen Reactions  . Mango Flavor Anaphylaxis and Swelling    Lips swelling  . Penicillins Anaphylaxis and Hives    Has patient had a PCN reaction causing immediate rash, facial/tongue/throat swelling, SOB or lightheadedness with hypotension: Yes Has patient had a PCN reaction causing severe rash involving mucus membranes or skin necrosis: No Has patient had a PCN reaction that required hospitalization No Has patient had a PCN reaction occurring within the last 10 years: No If all of the above answers are  "NO", then may proceed with Cephalosporin use.   . Tramadol Nausea And Vomiting  . Zofran [Ondansetron Hcl] Other (See Comments)    headache  . Bactrim [Sulfamethoxazole-Trimethoprim] Itching and Rash  . Keflex [Cephalexin] Hives, Itching and Rash    Patient reports 04/04/16 that she has taken this medication several times in the past and has only developed "itching" but denies rash, hives, anaphylactic reaction, angioedema.  . Toradol [Ketorolac Tromethamine] Rash   No current facility-administered medications on file prior to encounter.   Current Outpatient Medications on File Prior to Encounter  Medication Sig Dispense Refill  . acetaminophen-codeine (TYLENOL #3) 300-30 MG tablet Take 1-2 tablets by mouth every 6 (six) hours as needed for moderate pain. 12 tablet 0  . albuterol (PROVENTIL HFA;VENTOLIN HFA) 108 (90 Base) MCG/ACT inhaler Inhale 2 puffs into the lungs every 6 (six) hours as needed for wheezing or shortness of breath. 1 Inhaler 2  . clindamycin (CLEOCIN) 150 MG capsule Take 1 capsule (150 mg total) by mouth every 6 (six) hours. 28 capsule 0  . ibuprofen (ADVIL,MOTRIN) 600 MG tablet Take 1 tablet (600 mg total) by mouth every 6 (six) hours as needed. 30 tablet 0  . lidocaine (LIDODERM) 5 % Place 1 patch onto the skin daily. Remove & Discard patch within 12 hours or as directed by MD 30 patch 0  . methocarbamol (ROBAXIN) 500 MG tablet Take  1 tablet (500 mg total) by mouth 2 (two) times daily. 20 tablet 0  . methocarbamol (ROBAXIN-750) 750 MG tablet Take 1 tablet (750 mg total) by mouth 4 (four) times daily. 20 tablet 0  . methylPREDNISolone (MEDROL DOSEPAK) 4 MG TBPK tablet Take Tapered dose as directed 21 tablet 0  . [DISCONTINUED] dicyclomine (BENTYL) 20 MG tablet Take 1 tablet (20 mg total) by mouth 2 (two) times daily. (Patient not taking: Reported on 11/26/2015) 20 tablet 0  . [DISCONTINUED] diphenhydrAMINE (BENADRYL) 25 MG tablet Take 1 tablet (25 mg total) by mouth every 4  (four) hours as needed for itching. (Patient not taking: Reported on 04/25/2015) 30 tablet 0  . [DISCONTINUED] hyoscyamine (LEVSIN SL) 0.125 MG SL tablet Place 1 tablet (0.125 mg total) under the tongue every 4 (four) hours as needed. (Patient not taking: Reported on 09/26/2015) 30 tablet 0  . [DISCONTINUED] metoCLOPramide (REGLAN) 10 MG tablet Take 1 tablet (10 mg total) by mouth every 8 (eight) hours as needed for nausea or vomiting. (Patient not taking: Reported on 11/26/2015) 15 tablet 0  . [DISCONTINUED] ranitidine (ZANTAC) 150 MG tablet Take 1 tablet (150 mg total) by mouth 2 (two) times daily. (Patient not taking: Reported on 11/26/2015) 60 tablet 0   Social History   Socioeconomic History  . Marital status: Legally Separated    Spouse name: Not on file  . Number of children: Not on file  . Years of education: Not on file  . Highest education level: Not on file  Occupational History  . Not on file  Tobacco Use  . Smoking status: Never Smoker  . Smokeless tobacco: Never Used  Vaping Use  . Vaping Use: Never used  Substance and Sexual Activity  . Alcohol use: No  . Drug use: No  . Sexual activity: Yes    Birth control/protection: Surgical  Other Topics Concern  . Not on file  Social History Narrative  . Not on file   Social Determinants of Health   Financial Resource Strain:   . Difficulty of Paying Living Expenses: Not on file  Food Insecurity:   . Worried About Programme researcher, broadcasting/film/video in the Last Year: Not on file  . Ran Out of Food in the Last Year: Not on file  Transportation Needs:   . Lack of Transportation (Medical): Not on file  . Lack of Transportation (Non-Medical): Not on file  Physical Activity:   . Days of Exercise per Week: Not on file  . Minutes of Exercise per Session: Not on file  Stress:   . Feeling of Stress : Not on file  Social Connections:   . Frequency of Communication with Friends and Family: Not on file  . Frequency of Social Gatherings with  Friends and Family: Not on file  . Attends Religious Services: Not on file  . Active Member of Clubs or Organizations: Not on file  . Attends Banker Meetings: Not on file  . Marital Status: Not on file  Intimate Partner Violence:   . Fear of Current or Ex-Partner: Not on file  . Emotionally Abused: Not on file  . Physically Abused: Not on file  . Sexually Abused: Not on file   Family History  Problem Relation Age of Onset  . Cancer Mother   . Diabetes Mother   . Colon cancer Father        AFTER AGE 41  . Crohn's disease Sister      OBJECTIVE:  Vitals:  05/09/20 1424 05/09/20 1425  BP: (!) 131/99   Pulse: 82   Resp: 18   Temp: 98.5 F (36.9 C)   TempSrc: Oral   SpO2: 94%   Weight:  210 lb (95.3 kg)  Height:  5' (1.524 m)    General appearance: Alert; NAD HEENT: NCAT.  Oropharynx clear.  Lungs: clear to auscultation bilaterally without adventitious breath sounds Heart: regular rate and rhythm.  Radial pulses 2+ symmetrical bilaterally Abdomen: soft, non-distended; normal active bowel sounds; non-tender to light and deep palpation; nontender at McBurney's point; negative Murphy's sign; negative rebound; no guarding Back: no CVA tenderness Extremities: no edema; symmetrical with no gross deformities Skin: warm and dry Neurologic: normal gait Psychological: alert and cooperative; normal mood and affect  LABS: No results found for this or any previous visit (from the past 24 hour(s)).  DIAGNOSTIC STUDIES: No results found.   ASSESSMENT & PLAN:  1. Generalized body aches   2. Encounter for screening for COVID-19   3. Non-intractable vomiting with nausea, unspecified vomiting type     No orders of the defined types were placed in this encounter.  Discharge instructions  COVID-19 test will take 2 to 7 days for results to return.  Someone will call if your result is abnormal  Get rest and drink fluids Zofran prescribed.  Take as directed.     DIET Instructions:  30 minutes after taking nausea medicine, begin with sips of clear liquids. If able to hold down 2 - 4 ounces for 30 minutes, begin drinking more. Increase your fluid intake to replace losses. Clear liquids only for 24 hours (water, tea, sport drinks, clear flat ginger ale or cola and juices, broth, jello, popsicles, ect). Advance to bland foods, applesauce, rice, baked or boiled chicken, ect. Avoid milk, greasy foods and anything that doesn't agree with you.  If you experience new or worsening symptoms return or go to ER such as fever, chills, nausea, vomiting, diarrhea, bloody or dark tarry stools, constipation, urinary symptoms, worsening abdominal discomfort, symptoms that do not improve with medications, inability to keep fluids down, etc...  Reviewed expectations re: course of current medical issues. Questions answered. Outlined signs and symptoms indicating need for more acute intervention. Patient verbalized understanding. After Visit Summary given.    Note: This document was prepared using Dragon voice recognition software and may include unintentional dictation errors.    Durward Parcel, FNP 05/09/20 1448

## 2020-05-09 NOTE — ED Triage Notes (Signed)
Dental abcess busted x 3 days ago. Since the abscess busted pt has had N/V/D and body aches. Had neg rapid covid today.

## 2020-05-10 LAB — NOVEL CORONAVIRUS, NAA: SARS-CoV-2, NAA: NOT DETECTED

## 2020-06-26 ENCOUNTER — Encounter (HOSPITAL_COMMUNITY): Payer: Self-pay

## 2020-06-26 ENCOUNTER — Other Ambulatory Visit: Payer: Self-pay

## 2020-06-26 ENCOUNTER — Emergency Department (HOSPITAL_COMMUNITY)
Admission: EM | Admit: 2020-06-26 | Discharge: 2020-06-26 | Disposition: A | Discharge status: Home / Self Care | Payer: Medicare Other | Attending: Emergency Medicine | Admitting: Emergency Medicine

## 2020-06-26 DIAGNOSIS — Z20822 Contact with and (suspected) exposure to covid-19: Secondary | ICD-10-CM | POA: Diagnosis not present

## 2020-06-26 DIAGNOSIS — I10 Essential (primary) hypertension: Secondary | ICD-10-CM | POA: Diagnosis not present

## 2020-06-26 DIAGNOSIS — M2550 Pain in unspecified joint: Secondary | ICD-10-CM | POA: Diagnosis not present

## 2020-06-26 DIAGNOSIS — E119 Type 2 diabetes mellitus without complications: Secondary | ICD-10-CM | POA: Insufficient documentation

## 2020-06-26 DIAGNOSIS — M791 Myalgia, unspecified site: Secondary | ICD-10-CM | POA: Diagnosis present

## 2020-06-26 LAB — RESPIRATORY PANEL BY RT PCR (FLU A&B, COVID)
Influenza A by PCR: NEGATIVE
Influenza B by PCR: NEGATIVE
SARS Coronavirus 2 by RT PCR: NEGATIVE

## 2020-06-26 LAB — CBC WITH DIFFERENTIAL/PLATELET
Abs Immature Granulocytes: 0.01 10*3/uL (ref 0.00–0.07)
Basophils Absolute: 0 10*3/uL (ref 0.0–0.1)
Basophils Relative: 1 %
Eosinophils Absolute: 0.2 10*3/uL (ref 0.0–0.5)
Eosinophils Relative: 4 %
HCT: 40.7 % (ref 36.0–46.0)
Hemoglobin: 14 g/dL (ref 12.0–15.0)
Immature Granulocytes: 0 %
Lymphocytes Relative: 42 %
Lymphs Abs: 1.8 10*3/uL (ref 0.7–4.0)
MCH: 31.7 pg (ref 26.0–34.0)
MCHC: 34.4 g/dL (ref 30.0–36.0)
MCV: 92.1 fL (ref 80.0–100.0)
Monocytes Absolute: 0.4 10*3/uL (ref 0.1–1.0)
Monocytes Relative: 9 %
Neutro Abs: 1.9 10*3/uL (ref 1.7–7.7)
Neutrophils Relative %: 44 %
Platelets: 216 10*3/uL (ref 150–400)
RBC: 4.42 MIL/uL (ref 3.87–5.11)
RDW: 12.7 % (ref 11.5–15.5)
WBC: 4.3 10*3/uL (ref 4.0–10.5)
nRBC: 0 % (ref 0.0–0.2)

## 2020-06-26 LAB — URINALYSIS, ROUTINE W REFLEX MICROSCOPIC
Bacteria, UA: NONE SEEN
Bilirubin Urine: NEGATIVE
Glucose, UA: NEGATIVE mg/dL
Ketones, ur: NEGATIVE mg/dL
Leukocytes,Ua: NEGATIVE
Nitrite: NEGATIVE
Protein, ur: NEGATIVE mg/dL
Specific Gravity, Urine: 1.015 (ref 1.005–1.030)
pH: 6 (ref 5.0–8.0)

## 2020-06-26 LAB — BASIC METABOLIC PANEL
Anion gap: 8 (ref 5–15)
BUN: 5 mg/dL — ABNORMAL LOW (ref 6–20)
CO2: 22 mmol/L (ref 22–32)
Calcium: 8.4 mg/dL — ABNORMAL LOW (ref 8.9–10.3)
Chloride: 105 mmol/L (ref 98–111)
Creatinine, Ser: 0.72 mg/dL (ref 0.44–1.00)
GFR, Estimated: >60 mL/min (ref >60)
Glucose, Bld: 94 mg/dL (ref 70–99)
Potassium: 3.3 mmol/L — ABNORMAL LOW (ref 3.5–5.1)
Sodium: 135 mmol/L (ref 135–145)

## 2020-06-26 LAB — SEDIMENTATION RATE: Sed Rate: 12 mm/hr (ref 0–22)

## 2020-06-26 MED ORDER — OXYCODONE-ACETAMINOPHEN 5-325 MG PO TABS
1.0000 | ORAL_TABLET | Freq: Once | ORAL | Status: AC
  Administered 2020-06-26: 1 via ORAL
  Filled 2020-06-26: qty 1

## 2020-06-26 NOTE — Discharge Instructions (Addendum)
Your blood work today was reassuring.  Call your primary doctor tomorrow to arrange a follow-up appointment.  You may also follow-up with your rheumatologist if needed.

## 2020-06-26 NOTE — ED Provider Notes (Signed)
[facility] EMERGENCY DEPARTMENT Provider Note   CSN: [id number] Arrival date & time: 06/26/20  1116     History Chief Complaint  Patient presents with  . Generalized Body Aches    Regina Patton is a 49 y.o. female.  HPI      Regina Patton is a 48 y.o. female with past medical history of diet-controlled diabetes, hypertension, fibromyalgia, and RA, who presents to the Emergency Department complaining of generalized body aches, pain to multiple joints, tightness and swelling in the joints of her hands and feet.  Symptoms have been present since yesterday.  She states that she takes 10 mg prednisone daily along with methotrexate for her RA.  She believes that her pain is related to a exacerbation of her RA, but states that she has never had a "flare up."  She called her rheumatologist today, but did not receive a call back so she came to the ER for further evaluation.  She denies redness or rash, cough, chest pain, fever or chills, nausea or vomiting.  No known Covid exposures, no sick household members.  She states she is fully vaccinated for COVID-19.   Past Medical History:  Diagnosis Date  . C. difficile diarrhea   . Cervical radiculopathy   . Chronic abdominal pain   . Chronic back pain   . Chronic knee pain   . Chronic neck pain   . Diabetes mellitus without complication (HCC)    diet controlled  . Fibromyalgia   . Hypertension   . Rheumatoid arthritis (HCC)    "Rhematoid factor positive but no symptoms" per EPIC notes 2012  . Rib pain on right side    chronic  . Sciatic pain    right    Patient Active Problem List   Diagnosis Date Noted  . Cholecystitis with cholelithiasis 10/17/2015  . C. difficile colitis 04/19/2015  . Essential hypertension 04/05/2015  . Chronic pain syndrome 04/05/2015    Past Surgical History:  Procedure Laterality Date  . BACK SURGERY    . BLADDER SURGERY    . CHOLECYSTECTOMY N/A 10/18/2015   Procedure: LAPAROSCOPIC  CHOLECYSTECTOMY;  Surgeon: Franky Macho, MD;  Location: [facility];  Service: General;  Laterality: N/A;  . TUBAL LIGATION       OB History    Gravida  3   Para  3   Term  3   Preterm      AB      Living  3     SAB      TAB      Ectopic      Multiple      Live Births              Family History  Problem Relation Age of Onset  . Cancer Mother   . Diabetes Mother   . Colon cancer Father        AFTER AGE 17  . Crohn's disease Sister     Social History   Tobacco Use  . Smoking status: Never Smoker  . Smokeless tobacco: Never Used  Vaping Use  . Vaping Use: Never used  Substance Use Topics  . Alcohol use: No  . Drug use: No    Home Medications Prior to Admission medications   Medication Sig Start Date End Date Taking? Authorizing Provider  acetaminophen-codeine (TYLENOL #3) 300-30 MG tablet Take 1-2 tablets by mouth every 6 (six) hours as needed for moderate pain. 05/18/16   Ivery Quale, PA-C  albuterol (PROVENTIL HFA;VENTOLIN HFA) 108 (90 Base) MCG/ACT inhaler Inhale 2 puffs into the lungs every 6 (six) hours as needed for wheezing or shortness of breath. 09/19/17   Nita Sickle, MD  clindamycin (CLEOCIN) 150 MG capsule Take 1 capsule (150 mg total) by mouth every 6 (six) hours. 05/18/16   Ivery Quale, PA-C  ibuprofen (ADVIL,MOTRIN) 600 MG tablet Take 1 tablet (600 mg total) by mouth every 6 (six) hours as needed. 03/21/17   Joy, Shawn C, PA-C  lidocaine (LIDODERM) 5 % Place 1 patch onto the skin daily. Remove & Discard patch within 12 hours or as directed by MD 03/21/17   Joy, Shawn C, PA-C  methocarbamol (ROBAXIN) 500 MG tablet Take 1 tablet (500 mg total) by mouth 2 (two) times daily. 03/21/17   Joy, Shawn C, PA-C  methocarbamol (ROBAXIN-750) 750 MG tablet Take 1 tablet (750 mg total) by mouth 4 (four) times daily. 09/04/17   Joni Reining, PA-C  methylPREDNISolone (MEDROL DOSEPAK) 4 MG TBPK tablet Take Tapered dose as directed 09/04/17   Joni Reining, PA-C  ondansetron (ZOFRAN ODT) 4 MG disintegrating tablet Take 1 tablet (4 mg total) by mouth every 8 (eight) hours as needed for nausea or vomiting. 05/09/20   Avegno, Zachery Dakins, FNP  dicyclomine (BENTYL) 20 MG tablet Take 1 tablet (20 mg total) by mouth 2 (two) times daily. Patient not taking: Reported on 11/26/2015 10/12/15 11/26/15  Gilda Crease, MD  diphenhydrAMINE (BENADRYL) 25 MG tablet Take 1 tablet (25 mg total) by mouth every 4 (four) hours as needed for itching. Patient not taking: Reported on 04/25/2015 04/11/15 05/04/15  Lauri Till, Babette Relic, PA-C  hyoscyamine (LEVSIN SL) 0.125 MG SL tablet Place 1 tablet (0.125 mg total) under the tongue every 4 (four) hours as needed. Patient not taking: Reported on 09/26/2015 06/04/15 10/12/15  Gelene Mink, NP  metoCLOPramide (REGLAN) 10 MG tablet Take 1 tablet (10 mg total) by mouth every 8 (eight) hours as needed for nausea or vomiting. Patient not taking: Reported on 11/26/2015 10/13/15 11/26/15  Loren Racer, MD  ranitidine (ZANTAC) 150 MG tablet Take 1 tablet (150 mg total) by mouth 2 (two) times daily. Patient not taking: Reported on 11/26/2015 10/12/15 11/26/15  Gilda Crease, MD    Allergies    Mango flavor, Penicillins, Tramadol, Zofran Frazier Richards hcl], Bactrim [sulfamethoxazole-trimethoprim], Keflex [cephalexin], and Toradol [ketorolac tromethamine]  Review of Systems   Review of Systems  Constitutional: Negative for chills and fever.  HENT: Negative for congestion, sneezing and sore throat.   Respiratory: Negative for chest tightness and shortness of breath.   Cardiovascular: Negative for chest pain.  Gastrointestinal: Negative for abdominal pain, diarrhea, nausea and vomiting.  Genitourinary: Negative for difficulty urinating and dysuria.  Musculoskeletal: Positive for arthralgias, joint swelling and myalgias.  Skin: Negative for color change and rash.  Neurological: Negative for dizziness, weakness, numbness and  headaches.    Physical Exam Updated Vital Signs BP (!) 138/99 (BP Location: Left Arm)   Pulse 82   Temp 98.3 F (36.8 C) (Oral)   Resp 17   Ht 5' (1.524 m)   Wt 89.4 kg   SpO2 96%   BMI 38.47 kg/m   Physical Exam Vitals and nursing note reviewed.  Constitutional:      General: She is not in acute distress.    Appearance: Normal appearance. She is not ill-appearing or toxic-appearing.  HENT:     Head: Normocephalic.  Eyes:     Conjunctiva/sclera: Conjunctivae normal.  Pupils: Pupils are equal, round, and reactive to light.  Neck:     Thyroid: No thyromegaly.     Meningeal: Kernig's sign absent.  Cardiovascular:     Rate and Rhythm: Normal rate and regular rhythm.     Pulses: Normal pulses.  Pulmonary:     Effort: Pulmonary effort is normal.     Breath sounds: Normal breath sounds. No wheezing.  Abdominal:     Palpations: Abdomen is soft.     Tenderness: There is no abdominal tenderness. There is no guarding or rebound.  Musculoskeletal:        General: Normal range of motion.     Cervical back: Normal range of motion and neck supple.     Right lower leg: No edema.     Left lower leg: No edema.  Skin:    General: Skin is warm.     Capillary Refill: Capillary refill takes less than 2 seconds.     Findings: No rash.  Neurological:     General: No focal deficit present.     Mental Status: She is alert.     Sensory: No sensory deficit.     Motor: No weakness.     ED Results / Procedures / Treatments   Labs (all labs ordered are listed, but only abnormal results are displayed) Labs Reviewed  BASIC METABOLIC PANEL - Abnormal; Notable for the following components:      Result Value   Potassium 3.3 (*)    BUN 5 (*)    Calcium 8.4 (*)    All other components within normal limits  URINALYSIS, ROUTINE W REFLEX MICROSCOPIC - Abnormal; Notable for the following components:   Hgb urine dipstick SMALL (*)    All other components within normal limits  RESPIRATORY  PANEL BY RT PCR (FLU A&B, COVID)  CBC WITH DIFFERENTIAL/PLATELET  SEDIMENTATION RATE    EKG None  Radiology No results found.  Procedures Procedures (including critical care time)  Medications Ordered in ED Medications - No data to display  ED Course  I have reviewed the triage vital signs and the nursing notes.  Pertinent labs & imaging results that were available during my care of the patient were reviewed by me and considered in my medical decision making (see chart for details).    MDM Rules/Calculators/A&P                           Patient here with generalized body aches with swelling in the joints of her fingers and toes.  Symptoms present since yesterday.  On exam, she is well-appearing.  No concerning symptoms for septic joint.  Vital signs reviewed, labs are unremarkable.  Source of her myalgias/arthralgias is unclear.  Possible RA flare, but felt unlikely given her normal sed rate.  Doubt infectious process.  No appreciable edema of the joints.  Narcotic database reviewed, patient received prescription for Suboxone 2 days ago.  I do not feel additional narcotics are warranted at this time.  Patient agrees to close outpatient follow-up with PCP.   Final Clinical Impression(s) / ED Diagnoses Final diagnoses:  Arthralgia, unspecified joint    Rx / DC Orders ED Discharge Orders    None       Pauline Aus, PA-C 06/26/20 1801    Terrilee Files, MD 06/26/20 [phone]

## 2020-06-26 NOTE — ED Triage Notes (Signed)
Pt to er, pt states that she has a hx of RA, states that she is here because she is having general body aches and swelling in her joints, states that she called her rheumatologist with no answer, states that she has been taking tylenol without relief.  Denies cough or fever, states that the pain started yesterday around three.

## 2020-11-19 ENCOUNTER — Other Ambulatory Visit: Payer: Self-pay

## 2020-11-19 ENCOUNTER — Emergency Department (HOSPITAL_COMMUNITY): Payer: Medicare Other

## 2020-11-19 ENCOUNTER — Encounter (HOSPITAL_COMMUNITY): Payer: Self-pay | Admitting: *Deleted

## 2020-11-19 ENCOUNTER — Emergency Department (HOSPITAL_COMMUNITY)
Admission: EM | Admit: 2020-11-19 | Discharge: 2020-11-19 | Disposition: A | Discharge status: Home / Self Care | Payer: Medicare Other | Attending: Emergency Medicine | Admitting: Emergency Medicine

## 2020-11-19 DIAGNOSIS — N12 Tubulo-interstitial nephritis, not specified as acute or chronic: Secondary | ICD-10-CM | POA: Diagnosis not present

## 2020-11-19 DIAGNOSIS — I10 Essential (primary) hypertension: Secondary | ICD-10-CM | POA: Insufficient documentation

## 2020-11-19 DIAGNOSIS — R1030 Lower abdominal pain, unspecified: Secondary | ICD-10-CM | POA: Diagnosis present

## 2020-11-19 DIAGNOSIS — E119 Type 2 diabetes mellitus without complications: Secondary | ICD-10-CM | POA: Diagnosis not present

## 2020-11-19 LAB — CBC WITH DIFFERENTIAL/PLATELET
Abs Immature Granulocytes: 0.01 10*3/uL (ref 0.00–0.07)
Basophils Absolute: 0 10*3/uL (ref 0.0–0.1)
Basophils Relative: 1 %
Eosinophils Absolute: 0.2 10*3/uL (ref 0.0–0.5)
Eosinophils Relative: 3 %
HCT: 43.8 % (ref 36.0–46.0)
Hemoglobin: 14.8 g/dL (ref 12.0–15.0)
Immature Granulocytes: 0 %
Lymphocytes Relative: 34 %
Lymphs Abs: 2 10*3/uL (ref 0.7–4.0)
MCH: 31.1 pg (ref 26.0–34.0)
MCHC: 33.8 g/dL (ref 30.0–36.0)
MCV: 92 fL (ref 80.0–100.0)
Monocytes Absolute: 0.3 10*3/uL (ref 0.1–1.0)
Monocytes Relative: 6 %
Neutro Abs: 3.3 10*3/uL (ref 1.7–7.7)
Neutrophils Relative %: 56 %
Platelets: 252 10*3/uL (ref 150–400)
RBC: 4.76 MIL/uL (ref 3.87–5.11)
RDW: 13.3 % (ref 11.5–15.5)
WBC: 5.8 10*3/uL (ref 4.0–10.5)
nRBC: 0 % (ref 0.0–0.2)

## 2020-11-19 LAB — COMPREHENSIVE METABOLIC PANEL
ALT: 25 U/L (ref 0–44)
AST: 27 U/L (ref 15–41)
Albumin: 4 g/dL (ref 3.5–5.0)
Alkaline Phosphatase: 31 U/L — ABNORMAL LOW (ref 38–126)
Anion gap: 11 (ref 5–15)
BUN: 8 mg/dL (ref 6–20)
CO2: 23 mmol/L (ref 22–32)
Calcium: 9.2 mg/dL (ref 8.9–10.3)
Chloride: 103 mmol/L (ref 98–111)
Creatinine, Ser: 0.77 mg/dL (ref 0.44–1.00)
GFR, Estimated: >60 mL/min (ref >60)
Glucose, Bld: 92 mg/dL (ref 70–99)
Potassium: 3.6 mmol/L (ref 3.5–5.1)
Sodium: 137 mmol/L (ref 135–145)
Total Bilirubin: 0.5 mg/dL (ref 0.3–1.2)
Total Protein: 7.2 g/dL (ref 6.5–8.1)

## 2020-11-19 LAB — URINALYSIS, ROUTINE W REFLEX MICROSCOPIC
Bilirubin Urine: NEGATIVE
Glucose, UA: NEGATIVE mg/dL
Ketones, ur: NEGATIVE mg/dL
Nitrite: NEGATIVE
Protein, ur: 30 mg/dL — AB
Specific Gravity, Urine: 1.021 (ref 1.005–1.030)
WBC, UA: >50 WBC/hpf — ABNORMAL HIGH (ref 0–5)
pH: 5 (ref 5.0–8.0)

## 2020-11-19 MED ORDER — PHENAZOPYRIDINE HCL 200 MG PO TABS: 200.0000 mg | ORAL_TABLET | Freq: Three times a day (TID) | ORAL | 0 refills | Status: DC | PRN

## 2020-11-19 MED ORDER — PROCHLORPERAZINE MALEATE 10 MG PO TABS: 10.0000 mg | ORAL_TABLET | Freq: Two times a day (BID) | ORAL | 0 refills | Status: DC | PRN

## 2020-11-19 MED ORDER — PHENAZOPYRIDINE HCL 100 MG PO TABS
200.0000 mg | ORAL_TABLET | Freq: Once | ORAL | Status: AC
  Administered 2020-11-19: 200 mg via ORAL
  Filled 2020-11-19: qty 2

## 2020-11-19 MED ORDER — SODIUM CHLORIDE 0.9 % IV BOLUS
1000.0000 mL | Freq: Once | INTRAVENOUS | Status: AC
  Administered 2020-11-19: 1000 mL via INTRAVENOUS

## 2020-11-19 MED ORDER — CIPROFLOXACIN HCL 500 MG PO TABS: 500.0000 mg | ORAL_TABLET | Freq: Two times a day (BID) | ORAL | 0 refills | Status: DC

## 2020-11-19 MED ORDER — PROCHLORPERAZINE EDISYLATE 10 MG/2ML IJ SOLN
10.0000 mg | Freq: Once | INTRAMUSCULAR | Status: AC
Start: 2020-11-19 — End: 2020-11-19
  Administered 2020-11-19: 10 mg via INTRAVENOUS
  Filled 2020-11-19: qty 2

## 2020-11-19 MED ORDER — FENTANYL CITRATE (PF) 100 MCG/2ML IJ SOLN
50.0000 ug | Freq: Once | INTRAMUSCULAR | Status: AC
  Administered 2020-11-19: 50 ug via INTRAVENOUS
  Filled 2020-11-19: qty 2

## 2020-11-19 MED ORDER — CIPROFLOXACIN IN D5W 400 MG/200ML IV SOLN
400.0000 mg | Freq: Once | INTRAVENOUS | Status: AC
  Administered 2020-11-19: 400 mg via INTRAVENOUS
  Filled 2020-11-19: qty 200

## 2020-11-19 NOTE — ED Triage Notes (Signed)
Blood in urine onset last night, pain in back area

## 2020-11-19 NOTE — Discharge Instructions (Addendum)
You were seen in the ER today for flank pain & urinary problems.  Your CT scan was reassuring Your urine appears infected concerning for pyelonephritis.   We are sending you home with the following medications:  - Compazine- take every 12 hours as needed for nausea - Pyridium- take every 8 hours as needed for pain - Ciprofloxacin- take twice per day for the next 1 week, first dose was given in the Er today. This is an antibiotic. This medication puts you at risk for muscle/tendon injury- do not exercise, participate in sports, or perform quick movements.   We have prescribed you new medication(s) today. Discuss the medications prescribed today with your pharmacist as they can have adverse effects and interactions with your other medicines including over the counter and prescribed medications. Seek medical evaluation if you start to experience new or abnormal symptoms after taking one of these medicines, seek care immediately if you start to experience difficulty breathing, feeling of your throat closing, facial swelling, or rash as these could be indications of a more serious allergic reaction  Please follow up with your doctor as scheduled Thursday. Return to the ER for new or worsening symptoms including but not limited to fever, inability to keep fluids down, increased or new pain, or any other concerns.

## 2020-11-19 NOTE — ED Provider Notes (Signed)
[facility] EMERGENCY DEPARTMENT Provider Note   CSN: [id number] Arrival date & time: 11/19/20  1347     History Chief Complaint  Patient presents with  . Hematuria    Regina Patton is a 50 y.o. female with a hx of diabetes mellitus, fibromyalgia, tubal ligation, cholecystectomy, & chronic pain of multiple locations who presents to the ED with complaints of flank pain & urinary sxs since yesterday. Patient reports bilateral flank pain that radiates into the lower abdomen, waxing/waning in severity, no specific alleviating/aggravating factors. Reports associated dysuria, frequency, hematuria, & N/V/D. Has had 4 episodes of emesis, 5 episodes of diarrhea, non bloody. Feels like prior kidney/bladder issues. Denies fever, chills, chest pain, dyspnea, vaginal bleeding, vaginal discharge, hematemesis, melena, hematochezia, or syncope. LMP was > 1 year ago.   HPI     Past Medical History:  Diagnosis Date  . C. difficile diarrhea   . Cervical radiculopathy   . Chronic abdominal pain   . Chronic back pain   . Chronic knee pain   . Chronic neck pain   . Diabetes mellitus without complication (HCC)    diet controlled  . Fibromyalgia   . Hypertension   . Rheumatoid arthritis (HCC)    "Rhematoid factor positive but no symptoms" per EPIC notes 2012  . Rib pain on right side    chronic  . Sciatic pain    right    Patient Active Problem List   Diagnosis Date Noted  . Cholecystitis with cholelithiasis 10/17/2015  . C. difficile colitis 04/19/2015  . Essential hypertension 04/05/2015  . Chronic pain syndrome 04/05/2015    Past Surgical History:  Procedure Laterality Date  . BACK SURGERY    . BLADDER SURGERY    . CHOLECYSTECTOMY N/A 10/18/2015   Procedure: LAPAROSCOPIC CHOLECYSTECTOMY;  Surgeon: Franky Macho, MD;  Location: [facility];  Service: General;  Laterality: N/A;  . TUBAL LIGATION       OB History    Gravida  3   Para  3   Term  3   Preterm      AB      Living   3     SAB      IAB      Ectopic      Multiple      Live Births              Family History  Problem Relation Age of Onset  . Cancer Mother   . Diabetes Mother   . Colon cancer Father        AFTER AGE 73  . Crohn's disease Sister     Social History   Tobacco Use  . Smoking status: Never Smoker  . Smokeless tobacco: Never Used  Vaping Use  . Vaping Use: Never used  Substance Use Topics  . Alcohol use: No  . Drug use: No    Home Medications Prior to Admission medications   Medication Sig Start Date End Date Taking? Authorizing Provider  acetaminophen-codeine (TYLENOL #3) 300-30 MG tablet Take 1-2 tablets by mouth every 6 (six) hours as needed for moderate pain. Patient not taking: Reported on 06/26/2020 05/18/16   Ivery Quale, PA-C  albuterol (PROVENTIL HFA;VENTOLIN HFA) 108 (90 Base) MCG/ACT inhaler Inhale 2 puffs into the lungs every 6 (six) hours as needed for wheezing or shortness of breath. Patient not taking: Reported on 06/26/2020 09/19/17   Nita Sickle, MD  chlorhexidine (PERIDEX) 0.12 % solution Use as directed 5 mLs in  the mouth or throat daily as needed.  05/14/20   [provider]  clindamycin (CLEOCIN) 150 MG capsule Take 1 capsule (150 mg total) by mouth every 6 (six) hours. Patient not taking: Reported on 06/26/2020 05/18/16   Ivery Quale, PA-C  folic acid (FOLVITE) 1 MG tablet Take 1 mg by mouth daily. 02/23/20   [provider]  ibuprofen (ADVIL,MOTRIN) 600 MG tablet Take 1 tablet (600 mg total) by mouth every 6 (six) hours as needed. Patient not taking: Reported on 06/26/2020 03/21/17   Joy, Shawn C, PA-C  lidocaine (LIDODERM) 5 % Place 1 patch onto the skin daily. Remove & Discard patch within 12 hours or as directed by MD Patient not taking: Reported on 06/26/2020 03/21/17   Joy, Shawn C, PA-C  methocarbamol (ROBAXIN) 500 MG tablet Take 1 tablet (500 mg total) by mouth 2 (two) times daily. Patient not taking: Reported on  06/26/2020 03/21/17   Anselm Pancoast, PA-C  methocarbamol (ROBAXIN-750) 750 MG tablet Take 1 tablet (750 mg total) by mouth 4 (four) times daily. Patient not taking: Reported on 06/26/2020 09/04/17   Joni Reining, PA-C  methotrexate (RHEUMATREX) 2.5 MG tablet Take 7 tablets by mouth once a week. 7 03/26/20   [provider]  methylPREDNISolone (MEDROL DOSEPAK) 4 MG TBPK tablet Take Tapered dose as directed Patient not taking: Reported on 06/26/2020 09/04/17   Joni Reining, PA-C  ondansetron (ZOFRAN ODT) 4 MG disintegrating tablet Take 1 tablet (4 mg total) by mouth every 8 (eight) hours as needed for nausea or vomiting. Patient not taking: Reported on 06/26/2020 05/09/20   Durward Parcel, FNP  predniSONE (DELTASONE) 10 MG tablet Take 10 mg by mouth daily. 04/03/20   [provider]  rOPINIRole (REQUIP) 0.5 MG tablet Take 0.5 mg by mouth 3 (three) times daily. 05/01/20   [provider]  dicyclomine (BENTYL) 20 MG tablet Take 1 tablet (20 mg total) by mouth 2 (two) times daily. Patient not taking: Reported on 11/26/2015 10/12/15 11/26/15  Gilda Crease, MD  diphenhydrAMINE (BENADRYL) 25 MG tablet Take 1 tablet (25 mg total) by mouth every 4 (four) hours as needed for itching. Patient not taking: Reported on 04/25/2015 04/11/15 05/04/15  Triplett, Babette Relic, PA-C  hyoscyamine (LEVSIN SL) 0.125 MG SL tablet Place 1 tablet (0.125 mg total) under the tongue every 4 (four) hours as needed. Patient not taking: Reported on 09/26/2015 06/04/15 10/12/15  Gelene Mink, NP  metoCLOPramide (REGLAN) 10 MG tablet Take 1 tablet (10 mg total) by mouth every 8 (eight) hours as needed for nausea or vomiting. Patient not taking: Reported on 11/26/2015 10/13/15 11/26/15  Loren Racer, MD  ranitidine (ZANTAC) 150 MG tablet Take 1 tablet (150 mg total) by mouth 2 (two) times daily. Patient not taking: Reported on 11/26/2015 10/12/15 11/26/15  Gilda Crease, MD    Allergies    Mango  flavor, Penicillins, Tramadol, Zofran Frazier Richards hcl], Bactrim [sulfamethoxazole-trimethoprim], Keflex [cephalexin], and Toradol [ketorolac tromethamine]  Review of Systems   Review of Systems  Constitutional: Negative for chills and fever.  Respiratory: Negative for shortness of breath.   Cardiovascular: Negative for chest pain.  Gastrointestinal: Positive for abdominal pain, diarrhea, nausea and vomiting. Negative for anal bleeding, blood in stool and constipation.  Genitourinary: Positive for dysuria, flank pain, frequency, hematuria and urgency.  Neurological: Negative for syncope.  All other systems reviewed and are negative.   Physical Exam Updated Vital Signs BP (!) 152/96 (BP Location: Right Arm)  Pulse 76   Temp 97.8 F (36.6 C) (Oral)   Resp 20   Wt 85.9 kg   SpO2 100%   BMI 37.00 kg/m   Physical Exam Vitals and nursing note reviewed.  Constitutional:      General: She is not in acute distress.    Appearance: She is well-developed. She is not toxic-appearing.  HENT:     Head: Normocephalic and atraumatic.  Eyes:     General:        Right eye: No discharge.        Left eye: No discharge.     Conjunctiva/sclera: Conjunctivae normal.  Cardiovascular:     Rate and Rhythm: Normal rate and regular rhythm.  Pulmonary:     Effort: Pulmonary effort is normal. No respiratory distress.     Breath sounds: Normal breath sounds. No wheezing, rhonchi or rales.  Abdominal:     General: There is no distension.     Palpations: Abdomen is soft.     Tenderness: There is abdominal tenderness (lower abdomen). There is right CVA tenderness and left CVA tenderness. There is no guarding or rebound.  Musculoskeletal:     Cervical back: Neck supple.  Skin:    General: Skin is warm and dry.     Findings: No rash.  Neurological:     Mental Status: She is alert.     Comments: Clear speech.   Psychiatric:        Behavior: Behavior normal.     ED Results / Procedures /  Treatments   Labs (all labs ordered are listed, but only abnormal results are displayed) Labs Reviewed  URINALYSIS, ROUTINE W REFLEX MICROSCOPIC - Abnormal; Notable for the following components:      Result Value   Color, Urine AMBER (*)    APPearance CLOUDY (*)    Hgb urine dipstick SMALL (*)    Protein, ur 30 (*)    Leukocytes,Ua LARGE (*)    WBC, UA >50 (*)    Bacteria, UA RARE (*)    All other components within normal limits  COMPREHENSIVE METABOLIC PANEL - Abnormal; Notable for the following components:   Alkaline Phosphatase 31 (*)    All other components within normal limits  URINE CULTURE  CBC WITH DIFFERENTIAL/PLATELET    EKG None  Radiology CT Renal Stone Study  Result Date: 11/19/2020 CLINICAL DATA:  Bilateral flank pain.  Hematuria.  Nausea. EXAM: CT ABDOMEN AND PELVIS WITHOUT CONTRAST TECHNIQUE: Multidetector CT imaging of the abdomen and pelvis was performed following the standard protocol without IV contrast. COMPARISON:  CT 02/22/2016 FINDINGS: Lower chest: No acute airspace disease or pleural fluid. Tiny punctate subpleural nodules in the right lower lobe are unchanged from 2017 and considered benign. Hepatobiliary: No focal liver abnormality on this noncontrast exam. Cholecystectomy. Normal biliary tree post cholecystectomy. Pancreas: No ductal dilatation or inflammation. Spleen: Normal in size without focal abnormality. Adrenals/Urinary Tract: Normal adrenal glands. No high June. Is for renal calculi. Both ureters are decompressed without stones along the course. There is a phlebolith adjacent to the left ureter that is unchanged from prior exam. Cortical low-density lesion in the lower left kidney measures water density and is likely cyst, incompletely characterized on noncontrast exam. Urinary bladder is minimally distended. No bladder stone. No definite wall thickening for degree of distension. There is no perivesicular fat stranding. 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 Stomach/Bowel: Tiny hiatal hernia. Stomach otherwise unremarkable. Normal positioning of  the duodenum and ligament of Treitz. Decompressed small bowel without obstruction or inflammation. Normal appendix. Small to moderate colonic stool burden. No colonic wall thickening or pericolonic edema. Vascular/Lymphatic: Normal caliber abdominal aorta. Mild aortic atherosclerosis. No bulky abdominopelvic adenopathy. Reproductive: Uterus and bilateral adnexa are unremarkable. No adnexal mass. Ovaries are symmetric in size. Other: No free air, free fluid, or intra-abdominal fluid collection. Tiny fat containing umbilical hernia. There is postsurgical change of the lower anterior abdominal wall. Musculoskeletal: Grade 1 anterolisthesis of L5 on S1 likely related to prominent facet hypertrophy at this level. No pars defects. There are no acute or suspicious osseous abnormalities. No muscular findings to explain flank pain. IMPRESSION: 1. No renal stones or obstructive uropathy. 2. No acute abnormality or explanation for flank pain. Aortic Atherosclerosis (ICD10-I70.0). Electronically Signed   By: Narda Rutherford M.D.   On: 11/19/2020 15:29    Procedures Procedures   Medications Ordered in ED Medications  sodium chloride 0.9 % bolus 1,000 mL (1,000 mLs Intravenous New Bag/Given 11/19/20 1452)  fentaNYL (SUBLIMAZE) injection 50 mcg (50 mcg Intravenous Given 11/19/20 1453)  prochlorperazine (COMPAZINE) injection 10 mg (10 mg Intravenous Given 11/19/20 1453)  phenazopyridine (PYRIDIUM) tablet 200 mg (200 mg Oral Given 11/19/20 1626)  ciprofloxacin (CIPRO) IVPB 400 mg (400 mg Intravenous New Bag/Given 11/19/20 1626)    ED Course  I have reviewed the triage vital signs and the nursing notes.  Pertinent labs & imaging results that were available during my care of the patient were reviewed by me and considered in my medical decision making (see chart for  details).    MDM Rules/Calculators/A&P                         Patient presents to the ED with complaints of flank pain and urinary sxs. Nontoxic, vitals WNL with the exception of mildly elevated BP- doubt HTN emergency. Bilateral CVA tenderness and lower abdominal tenderness on exam.  Ddx:   Additional history obtained:  Additional history obtained from chart review & nursing note review.   Lab Tests:  I Ordered, reviewed, and interpreted labs, which included:  CBC: Unremarkable.  CMP: Unremarkable UA: Concerning for infection, contamination noted.   Imaging Studies ordered:  I ordered imaging studies which included CT renal stone study, I independently reviewed, formal radiology impression shows:  1. No renal stones or obstructive uropathy. 2. No acute abnormality or explanation for flank pain. Aortic Atherosclerosis  ED Course:  Reassuring labs/imaging.  Normal appendix. No ureteral stones. No vaginal complaints. Patient states feels similar to prior kidney infections.   Overall concerning for pyelonephritis, based on review of patients' prior culture and her allergies will start on ciprofloxacin.   05:20: RE-EVAL: Patient is feeling improved, she is tolerating p.o., and she feels ready to go home.  She is to follow-up with her doctor already scheduled for Thursday (3/17).   We will start on ciprofloxacin, Compazine, and Pyridium.  We discussed avoiding strenuous/quick/sports type activities being on ciprofloxacin. I discussed results, treatment plan, need for follow-up, and return precautions with the patient. Provided opportunity for questions, patient confirmed understanding and is in agreement with plan.   Portions of this note were generated with Scientist, clinical (histocompatibility and immunogenetics). Dictation errors may occur despite best attempts at proofreading.   Final Clinical Impression(s) / ED Diagnoses Final diagnoses:  Pyelonephritis    Rx / DC Orders ED Discharge Orders         Ordered  ciprofloxacin (CIPRO) 500 MG tablet  Every 12 hours        11/19/20 1734    prochlorperazine (COMPAZINE) 10 MG tablet  2 times daily PRN        11/19/20 1734    phenazopyridine (PYRIDIUM) 200 MG tablet  3 times daily PRN        11/19/20 [address], Pleas Koch, PA-C 11/19/20 1748    Maia Plan, MD 11/19/20 2313

## 2020-11-22 LAB — URINE CULTURE: Culture: >=100000 — AB

## 2020-12-16 ENCOUNTER — Other Ambulatory Visit: Payer: Self-pay

## 2020-12-16 ENCOUNTER — Encounter: Payer: Self-pay | Admitting: Emergency Medicine

## 2020-12-16 ENCOUNTER — Ambulatory Visit
Admission: EM | Admit: 2020-12-16 | Discharge: 2020-12-16 | Disposition: A | Discharge status: Home / Self Care | Payer: Medicare Other | Attending: Family Medicine | Admitting: Family Medicine

## 2020-12-16 ENCOUNTER — Ambulatory Visit (INDEPENDENT_AMBULATORY_CARE_PROVIDER_SITE_OTHER): Payer: Medicare Other

## 2020-12-16 DIAGNOSIS — R062 Wheezing: Secondary | ICD-10-CM | POA: Diagnosis not present

## 2020-12-16 DIAGNOSIS — R059 Cough, unspecified: Secondary | ICD-10-CM

## 2020-12-16 DIAGNOSIS — R509 Fever, unspecified: Secondary | ICD-10-CM

## 2020-12-16 DIAGNOSIS — J209 Acute bronchitis, unspecified: Secondary | ICD-10-CM | POA: Diagnosis not present

## 2020-12-16 MED ORDER — ALBUTEROL SULFATE HFA 108 (90 BASE) MCG/ACT IN AERS
2.0000 | INHALATION_SPRAY | Freq: Four times a day (QID) | RESPIRATORY_TRACT | Status: DC | PRN
  Administered 2020-12-16: 2 via RESPIRATORY_TRACT

## 2020-12-16 MED ORDER — DOXYCYCLINE HYCLATE 100 MG PO CAPS: 100.0000 mg | ORAL_CAPSULE | Freq: Two times a day (BID) | ORAL | 0 refills | Status: DC

## 2020-12-16 MED ORDER — PROMETHAZINE-DM 6.25-15 MG/5ML PO SYRP: 5.0000 mL | ORAL_SOLUTION | Freq: Four times a day (QID) | ORAL | 0 refills | Status: DC | PRN

## 2020-12-16 MED ORDER — IPRATROPIUM-ALBUTEROL 0.5-2.5 (3) MG/3ML IN SOLN
3.0000 mL | Freq: Four times a day (QID) | RESPIRATORY_TRACT | 0 refills | Status: DC | PRN
Start: 2020-12-16 — End: 2021-02-11

## 2020-12-16 NOTE — ED Triage Notes (Signed)
Cough, shortness of breath, nasal congestion, mid back pain.  S/s started yesterday.  Pt worried she has bronchitis or PNE.

## 2020-12-16 NOTE — Discharge Instructions (Signed)
Chest x-ray negative for pneumonia. Treating you for bronchitis Start doxycyline 100 mg twice daily and take for a total of 10 days. Start duo neb treatments every 6 hours until wheezing improves Promethazine DM for cough Albuterol rescue inhaler for wheezing or shortness of breath when unable to perform neb treatments Emergency department if shortness of breath or wheezing worsens

## 2020-12-16 NOTE — ED Provider Notes (Signed)
[facility]    CSN: [id number] Arrival date & time: 12/16/20  1026      History   Chief Complaint Chief Complaint  Patient presents with  . Cough    HPI Regina Patton is a 50 y.o. female.   HPI Patient presents today with few days of worsening wheezing, cough, shortness of breath, and fever. Patient is immunocompromised due to RA treatment. Currently takes daily prednisone 20 mg daily and methotrexate. Patient reports history of pneumonia which symptoms presented similarly. Last fever x 1 days ago 100.4 F. Denies any history of asthma or reactive airway disease. She is nonsmoker.   Past Medical History:  Diagnosis Date  . C. difficile diarrhea   . Cervical radiculopathy   . Chronic abdominal pain   . Chronic back pain   . Chronic knee pain   . Chronic neck pain   . Diabetes mellitus without complication (HCC)    diet controlled  . Fibromyalgia   . Hypertension   . Rheumatoid arthritis (HCC)    "Rhematoid factor positive but no symptoms" per EPIC notes 2012  . Rib pain on right side    chronic  . Sciatic pain    right    Patient Active Problem List   Diagnosis Date Noted  . Cholecystitis with cholelithiasis 10/17/2015  . C. difficile colitis 04/19/2015  . Essential hypertension 04/05/2015  . Chronic pain syndrome 04/05/2015    Past Surgical History:  Procedure Laterality Date  . BACK SURGERY    . BLADDER SURGERY    . CHOLECYSTECTOMY N/A 10/18/2015   Procedure: LAPAROSCOPIC CHOLECYSTECTOMY;  Surgeon: Franky Macho, MD;  Location: [facility];  Service: General;  Laterality: N/A;  . TUBAL LIGATION      OB History    Gravida  3   Para  3   Term  3   Preterm      AB      Living  3     SAB      IAB      Ectopic      Multiple      Live Births               Home Medications    Prior to Admission medications   Medication Sig Start Date End Date Taking? Authorizing Provider  acetaminophen-codeine (TYLENOL #3) 300-30 MG tablet  Take 1-2 tablets by mouth every 6 (six) hours as needed for moderate pain. Patient not taking: Reported on 06/26/2020 05/18/16   Ivery Quale, PA-C  albuterol (PROVENTIL HFA;VENTOLIN HFA) 108 (90 Base) MCG/ACT inhaler Inhale 2 puffs into the lungs every 6 (six) hours as needed for wheezing or shortness of breath. Patient not taking: Reported on 06/26/2020 09/19/17   Nita Sickle, MD  chlorhexidine (PERIDEX) 0.12 % solution Use as directed 5 mLs in the mouth or throat daily as needed.  05/14/20   [provider]  ciprofloxacin (CIPRO) 500 MG tablet Take 1 tablet (500 mg total) by mouth every 12 (twelve) hours. 11/19/20   Petrucelli, Samantha R, PA-C  clindamycin (CLEOCIN) 150 MG capsule Take 1 capsule (150 mg total) by mouth every 6 (six) hours. Patient not taking: Reported on 06/26/2020 05/18/16   Ivery Quale, PA-C  folic acid (FOLVITE) 1 MG tablet Take 1 mg by mouth daily. 02/23/20   [provider]  ibuprofen (ADVIL,MOTRIN) 600 MG tablet Take 1 tablet (600 mg total) by mouth every 6 (six) hours as needed. Patient not taking: Reported on 06/26/2020 03/21/17  Joy, Shawn C, PA-C  lidocaine (LIDODERM) 5 % Place 1 patch onto the skin daily. Remove & Discard patch within 12 hours or as directed by MD Patient not taking: Reported on 06/26/2020 03/21/17   Joy, Shawn C, PA-C  methocarbamol (ROBAXIN) 500 MG tablet Take 1 tablet (500 mg total) by mouth 2 (two) times daily. Patient not taking: Reported on 06/26/2020 03/21/17   Anselm Pancoast, PA-C  methocarbamol (ROBAXIN-750) 750 MG tablet Take 1 tablet (750 mg total) by mouth 4 (four) times daily. Patient not taking: Reported on 06/26/2020 09/04/17   Joni Reining, PA-C  methotrexate (RHEUMATREX) 2.5 MG tablet Take 7 tablets by mouth once a week. 7 03/26/20   [provider]  methylPREDNISolone (MEDROL DOSEPAK) 4 MG TBPK tablet Take Tapered dose as directed Patient not taking: Reported on 06/26/2020 09/04/17   Joni Reining,  PA-C  ondansetron (ZOFRAN ODT) 4 MG disintegrating tablet Take 1 tablet (4 mg total) by mouth every 8 (eight) hours as needed for nausea or vomiting. Patient not taking: Reported on 06/26/2020 05/09/20   Durward Parcel, FNP  phenazopyridine (PYRIDIUM) 200 MG tablet Take 1 tablet (200 mg total) by mouth 3 (three) times daily as needed for pain. 11/19/20   Petrucelli, Samantha R, PA-C  predniSONE (DELTASONE) 10 MG tablet Take 10 mg by mouth daily. 04/03/20   [provider]  prochlorperazine (COMPAZINE) 10 MG tablet Take 1 tablet (10 mg total) by mouth 2 (two) times daily as needed for nausea or vomiting. 11/19/20   Petrucelli, Samantha R, PA-C  rOPINIRole (REQUIP) 0.5 MG tablet Take 0.5 mg by mouth 3 (three) times daily. 05/01/20   [provider]  dicyclomine (BENTYL) 20 MG tablet Take 1 tablet (20 mg total) by mouth 2 (two) times daily. Patient not taking: Reported on 11/26/2015 10/12/15 11/26/15  Gilda Crease, MD  diphenhydrAMINE (BENADRYL) 25 MG tablet Take 1 tablet (25 mg total) by mouth every 4 (four) hours as needed for itching. Patient not taking: Reported on 04/25/2015 04/11/15 05/04/15  Triplett, Babette Relic, PA-C  hyoscyamine (LEVSIN SL) 0.125 MG SL tablet Place 1 tablet (0.125 mg total) under the tongue every 4 (four) hours as needed. Patient not taking: Reported on 09/26/2015 06/04/15 10/12/15  Gelene Mink, NP  metoCLOPramide (REGLAN) 10 MG tablet Take 1 tablet (10 mg total) by mouth every 8 (eight) hours as needed for nausea or vomiting. Patient not taking: Reported on 11/26/2015 10/13/15 11/26/15  Loren Racer, MD  ranitidine (ZANTAC) 150 MG tablet Take 1 tablet (150 mg total) by mouth 2 (two) times daily. Patient not taking: Reported on 11/26/2015 10/12/15 11/26/15  Gilda Crease, MD    Family History Family History  Problem Relation Age of Onset  . Cancer Mother   . Diabetes Mother   . Colon cancer Father        AFTER AGE 49  . Crohn's disease Sister      Social History Social History   Tobacco Use  . Smoking status: Never Smoker  . Smokeless tobacco: Never Used  Vaping Use  . Vaping Use: Never used  Substance Use Topics  . Alcohol use: No  . Drug use: No     Allergies   Mango flavor, Penicillins, Tramadol, Zofran [ondansetron hcl], Bactrim [sulfamethoxazole-trimethoprim], Keflex [cephalexin], and Toradol [ketorolac tromethamine]   Review of Systems Review of Systems Pertinent negatives listed in HPI   Physical Exam Triage Vital Signs ED Triage Vitals  Enc Vitals Group  BP 12/16/20 1036 125/82     Pulse Rate 12/16/20 1035 64     Resp 12/16/20 1035 18     Temp 12/16/20 1035 98.4 F (36.9 C)     Temp Source 12/16/20 1035 Oral     SpO2 12/16/20 1035 98 %     Weight --      Height --      Head Circumference --      Peak Flow --      Pain Score 12/16/20 1035 6     Pain Loc --      Pain Edu? --      Excl. in GC? --    No data found.  Updated Vital Signs BP 125/82 (BP Location: Right Arm)   Pulse 64   Temp 98.4 F (36.9 C) (Oral)   Resp 18   SpO2 98%   Visual Acuity Right Eye Distance:   Left Eye Distance:   Bilateral Distance:    Right Eye Near:   Left Eye Near:    Bilateral Near:     Physical Exam General appearance: alert, Ill-appearing, no distress Head: Normocephalic, without obvious abnormality, atraumatic ENT: External ears normal, mucosal edema, congestion, erythematous oropharynx w/o exudate Respiratory: Respirations even , unlabored, coarse lung sound, diffuse wheeze present  Heart: rate and rhythm normal. No gallop or murmurs noted on exam  Abdomen: BS +, no distention, no rebound tenderness, or no mass Extremities: No gross deformities Skin: Skin color, texture, turgor normal. No rashes seen  Psych: Appropriate mood and affect. Neurologic: GCS 15, normal coordination, normal gait   UC Treatments / Results  Labs (all labs ordered are listed, but only abnormal results are  displayed) Labs Reviewed - No data to display  EKG   Radiology DG Chest 2 View  Result Date: 12/16/2020 CLINICAL DATA:  50 year old female with history of fever, cough and wheezing. Cough and congestion. Evaluate for potential pneumonia. EXAM: CHEST - 2 VIEW COMPARISON:  Chest x-ray 09/19/2017. FINDINGS: Lung volumes are normal. No consolidative airspace disease. No pleural effusions. No pneumothorax. No pulmonary nodule or mass noted. Pulmonary vasculature and the cardiomediastinal silhouette are within normal limits. IMPRESSION: No radiographic evidence of acute cardiopulmonary disease. Electronically Signed   By: Trudie Reed M.D.   On: 12/16/2020 11:15    Procedures Procedures (including critical care time)  Medications Ordered in UC Medications - No data to display  Initial Impression / Assessment and Plan / UC Course  I have reviewed the triage vital signs and the nursing notes.  Pertinent labs & imaging results that were available during my care of the patient were reviewed by me and considered in my medical decision making (see chart for details).    Chest x-ray negative for pneumonia treating for an acute bronchitis.  Patient chronically takes prednisone therefore no changes to chronic course of treatment.  We will treat with doxycycline 100 mg twice daily for 10 days.  Patient dispensed a nebulizer machine to administer home nebulizer treatments and duo nebs every 6 hours until wheezing resolves.  Albuterol inhaler treatment for when unable to administer neb treatments.Promethazine DM for cough.  Strict ER precautions if symptoms worsen or do not improve.  Final Clinical Impressions(s) / UC Diagnoses   Final diagnoses:  Acute bronchitis, unspecified organism  Wheezing     Discharge Instructions     Chest x-ray negative for pneumonia. Treating you for bronchitis Start doxycyline 100 mg twice daily and take for a total of 10 days.  Start duo neb treatments every 6  hours until wheezing improves Promethazine DM for cough Albuterol rescue inhaler for wheezing or shortness of breath when unable to perform neb treatments Emergency department if shortness of breath or wheezing worsens     ED Prescriptions    Medication Sig Dispense Auth. Provider   promethazine-dextromethorphan (PROMETHAZINE-DM) 6.25-15 MG/5ML syrup Take 5 mLs by mouth 4 (four) times daily as needed for cough. 140 mL Bing Neighbors, FNP   doxycycline (VIBRAMYCIN) 100 MG capsule Take 1 capsule (100 mg total) by mouth 2 (two) times daily. 20 capsule Bing Neighbors, FNP   ipratropium-albuterol (DUONEB) 0.5-2.5 (3) MG/3ML SOLN Take 3 mLs by nebulization every 6 (six) hours as needed. 360 mL Bing Neighbors, FNP     PDMP not reviewed this encounter.   Bing Neighbors, FNP 12/16/20 1146

## 2021-01-15 ENCOUNTER — Telehealth: Payer: Self-pay

## 2021-01-15 ENCOUNTER — Ambulatory Visit (INDEPENDENT_AMBULATORY_CARE_PROVIDER_SITE_OTHER): Payer: Medicare Other | Admitting: Internal Medicine

## 2021-01-15 ENCOUNTER — Encounter: Payer: Self-pay | Admitting: Internal Medicine

## 2021-01-15 ENCOUNTER — Other Ambulatory Visit: Payer: Self-pay

## 2021-01-15 VITALS — BP 144/88 | HR 63 | Resp 15 | Ht 60.5 in | Wt 190.0 lb

## 2021-01-15 DIAGNOSIS — G894 Chronic pain syndrome: Secondary | ICD-10-CM

## 2021-01-15 DIAGNOSIS — M059 Rheumatoid arthritis with rheumatoid factor, unspecified: Secondary | ICD-10-CM | POA: Insufficient documentation

## 2021-01-15 DIAGNOSIS — Z79899 Other long term (current) drug therapy: Secondary | ICD-10-CM | POA: Diagnosis not present

## 2021-01-15 MED ORDER — FOLIC ACID 1 MG PO TABS: 1.0000 mg | ORAL_TABLET | Freq: Every day | ORAL | 0 refills | Status: DC

## 2021-01-15 MED ORDER — METHOTREXATE 2.5 MG PO TABS: 17.5000 mg | ORAL_TABLET | ORAL | 0 refills | Status: AC

## 2021-01-15 MED ORDER — PREDNISONE 5 MG PO TABS: 7.5000 mg | ORAL_TABLET | Freq: Every day | ORAL | 2 refills | Status: DC

## 2021-01-15 NOTE — Progress Notes (Signed)
Office Visit Note  Patient: Regina Patton             Date of Birth: 11/25/1970           MRN: [id number]             PCP: Alvina Filbert, MD Referring: Alvina Filbert, MD Visit Date: 01/15/2021   Subjective:  New Patient (Initial Visit) (RA)   History of Present Illness: Regina Patton is a 51 y.o. female here for evaluation of rheumatoid arthritis on methotrexate 17.5 mg PO weekly and prednisone and chronic pain on low dose oxycodone. She was previously seen for rheumatoid arthritis years ago in [place] and not diagnosed with disease at that time. However recently she was evaluated by Dr. Octaviano Glow in [place] she was diagnosed and started treatment based on positive RF and joint swelling and pain.  This work-up was initiated based on chronic back pain with abnormal imaging findings but she was also noted to have some synovitis of the bilateral hands.  As her symptoms were fairly well controlled with the methotrexate and prednisone she reports being switched from oxycodone to hydrocodone treatment due to tests of side effects from the more potent medication.  She describes a moderate amount of ongoing joint pain and stiffness but has not noticed any recent joint swelling redness or inflammation changes.   Activities of Daily Living:  Patient reports morning stiffness for 2 hours.   Patient Denies nocturnal pain.  Difficulty dressing/grooming: Denies Difficulty climbing stairs: Denies Difficulty getting out of chair: Denies Difficulty using hands for taps, buttons, cutlery, and/or writing: Reports  Review of Systems  Constitutional: Positive for fatigue.  HENT: Positive for mouth dryness.   Eyes: Negative for dryness.  Respiratory: Negative for shortness of breath.   Cardiovascular: Positive for swelling in legs/feet.  Gastrointestinal: Positive for constipation and diarrhea.  Endocrine: Positive for cold intolerance, heat intolerance, excessive thirst and increased urination.   Genitourinary: Negative for difficulty urinating.  Musculoskeletal: Positive for arthralgias, joint pain, joint swelling, morning stiffness and muscle tenderness.  Skin: Positive for rash.  Allergic/Immunologic: Positive for susceptible to infections.  Neurological: Negative for numbness.  Hematological: Positive for bruising/bleeding tendency.  Psychiatric/Behavioral: Positive for sleep disturbance.    PMFS History:  Patient Active Problem List   Diagnosis Date Noted  . Seropositive rheumatoid arthritis (HCC) 01/15/2021  . High risk medication use 01/15/2021  . Cholecystitis with cholelithiasis 10/17/2015  . C. difficile colitis 04/19/2015  . Essential hypertension 04/05/2015  . Chronic pain syndrome 04/05/2015    Past Medical History:  Diagnosis Date  . C. difficile diarrhea   . Cervical radiculopathy   . Chronic abdominal pain   . Chronic back pain   . Chronic knee pain   . Chronic neck pain   . Diabetes mellitus without complication (HCC)    diet controlled  . Fibromyalgia   . Hypertension   . Rheumatoid arthritis (HCC)    "Rhematoid factor positive but no symptoms" per EPIC notes 2012  . Rib pain on right side    chronic  . Sciatic pain    right    Family History  Problem Relation Age of Onset  . Cancer Mother   . Diabetes Mother   . Lupus Mother   . Thyroid disease Mother   . Colon cancer Father        AFTER AGE 18  . Crohn's disease Sister    Past Surgical History:  Procedure Laterality Date  . BACK  SURGERY    . BLADDER SURGERY    . CHOLECYSTECTOMY N/A 10/18/2015   Procedure: LAPAROSCOPIC CHOLECYSTECTOMY;  Surgeon: Franky Macho, MD;  Location: [facility];  Service: General;  Laterality: N/A;  . TUBAL LIGATION    . URETHRAL DIVERTICULECTOMY     Social History   Social History Narrative  . Not on file   Immunization History  Administered Date(s) Administered  . Influenza,inj,Quad PF,6+ Mos 10/19/2015  . Moderna Sars-Covid-2 Vaccination 01/08/2020,  02/06/2020     Objective: Vital Signs: BP (!) 144/88 (BP Location: Right Arm, Patient Position: Sitting, Cuff Size: Normal)   Pulse 63   Resp 15   Ht 5' 0.5" (1.537 m)   Wt 190 lb (86.2 kg)   BMI 36.50 kg/m    Physical Exam Constitutional:      Appearance: She is obese.  HENT:     Right Ear: External ear normal.     Left Ear: External ear normal.     Mouth/Throat:     Mouth: Mucous membranes are moist.     Pharynx: Oropharynx is clear.  Eyes:     Conjunctiva/sclera: Conjunctivae normal.  Cardiovascular:     Rate and Rhythm: Normal rate and regular rhythm.  Pulmonary:     Effort: Pulmonary effort is normal.     Breath sounds: Normal breath sounds.  Skin:    General: Skin is warm and dry.     Findings: No rash.  Neurological:     General: No focal deficit present.     Mental Status: She is alert.  Psychiatric:        Mood and Affect: Mood normal.     Musculoskeletal Exam:  Shoulders full ROM no tenderness or swelling Elbows full ROM no tenderness or swelling Wrists full ROM no tenderness or swelling Fingers full ROM no tenderness or swelling Lateral hip tenderness to palpation on both sides lumbosacral paraspinal tenderness to palpation Bony enlargement at achilles tendon insertion Numbness and paresthesia in great toes without MSK changes  CDAI Exam: CDAI Score: -- Patient Global: --; Provider Global: -- Swollen: --; Tender: -- Joint Exam 01/15/2021   No joint exam has been documented for this visit   There is currently no information documented on the homunculus. Go to the Rheumatology activity and complete the homunculus joint exam.  Investigation: No additional findings.  Imaging: DG Chest 2 View  Result Date: 12/16/2020 CLINICAL DATA:  50 year old female with history of fever, cough and wheezing. Cough and congestion. Evaluate for potential pneumonia. EXAM: CHEST - 2 VIEW COMPARISON:  Chest x-ray 09/19/2017. FINDINGS: Lung volumes are normal. No  consolidative airspace disease. No pleural effusions. No pneumothorax. No pulmonary nodule or mass noted. Pulmonary vasculature and the cardiomediastinal silhouette are within normal limits. IMPRESSION: No radiographic evidence of acute cardiopulmonary disease. Electronically Signed   By: Trudie Reed M.D.   On: 12/16/2020 11:15    Recent Labs: Lab Results  Component Value Date   WBC 5.8 11/19/2020   HGB 14.8 11/19/2020   PLT 252 11/19/2020   NA 137 11/19/2020   K 3.6 11/19/2020   CL 103 11/19/2020   CO2 23 11/19/2020   GLUCOSE 92 11/19/2020   BUN 8 11/19/2020   CREATININE 0.77 11/19/2020   BILITOT 0.5 11/19/2020   ALKPHOS 31 (L) 11/19/2020   AST 27 11/19/2020   ALT 25 11/19/2020   PROT 7.2 11/19/2020   ALBUMIN 4.0 11/19/2020   CALCIUM 9.2 11/19/2020   GFRAA >60 09/19/2017    Speciality Comments:  No specialty comments available.  Procedures:  No procedures performed Allergies: Mango flavor, Penicillins, Tramadol, Zofran [ondansetron hcl], Bactrim [sulfamethoxazole-trimethoprim], Keflex [cephalexin], and Toradol [ketorolac tromethamine]   Assessment / Plan:     Visit Diagnoses: Seropositive rheumatoid arthritis (HCC) - Plan: Sedimentation rate, Rheumatoid factor, Cyclic citrul peptide antibody, IgG, methotrexate (RHEUMATREX) 2.5 MG tablet, folic acid (FOLVITE) 1 MG tablet, predniSONE (DELTASONE) 5 MG tablet  For inflammatory arthritis appears well controlled currently with no significant synovitis on exam or reported by the patient recently.  We will recheck serology with RF, CCP, and checking sed rate today.  Plan to continue methotrexate at 17.5 mg p.o. weekly but I recommend she begin to taper the prednisone dose.  She has been on 10 mg for what sounds like greater than 1 year so would decrease stepwise down to 7.5 mg daily today.  Chronic pain syndrome  Chronic pain especially in the low back sounds like a distinct process from the RA disease activity it has been  reasonably controlled on low-dose chronic opioid medication and her other treatments.  High risk medication use - Plan: CBC with Differential/Platelet, COMPLETE METABOLIC PANEL WITH GFR, Hepatitis B core antibody, IgM, Hepatitis B surface antigen, Hepatitis C antibody  Methotrexate toxicity monitoring for cytopenias or hepatotoxicity checking CBC and CMP today.  I do not see hepatitis serology on outside records reviewed so we will check screening today.  Orders: Orders Placed This Encounter  Procedures  . Sedimentation rate  . Rheumatoid factor  . Cyclic citrul peptide antibody, IgG  . CBC with Differential/Platelet  . COMPLETE METABOLIC PANEL WITH GFR  . Hepatitis B core antibody, IgM  . Hepatitis B surface antigen  . Hepatitis C antibody   Meds ordered this encounter  Medications  . methotrexate (RHEUMATREX) 2.5 MG tablet    Sig: Take 7 tablets (17.5 mg total) by mouth once a week. 7    Dispense:  84 tablet    Refill:  0  . folic acid (FOLVITE) 1 MG tablet    Sig: Take 1 tablet (1 mg total) by mouth daily.    Dispense:  90 tablet    Refill:  0  . predniSONE (DELTASONE) 5 MG tablet    Sig: Take 1.5 tablets (7.5 mg total) by mouth daily with breakfast.    Dispense:  45 tablet    Refill:  2     Follow-Up Instructions: Return in about 3 months (around 04/17/2021) for RA f/u on MTX.   Fuller Plan, MD  Note - This record has been created using [employer].  Chart creation errors have been sought, but may not always  have been located. Such creation errors do not reflect on  the standard of medical care.

## 2021-01-15 NOTE — Telephone Encounter (Signed)
Discussed with Regina Patton she had started low dose Norco BID PRN for her chronic back pain and joint pains with Dr. Octaviano Glow, I recommended I do not prescribe long term opioid pain medication for RA and chronic pain symptoms but can refer to pain management clinic. She agrees with referral for this. I asked her to contact us after setting an appointment for this I can prescribe medicine in the short term if needed.

## 2021-01-15 NOTE — Telephone Encounter (Signed)
Patient called stating she was looking at her AVS and saw where Dr. Dimple Casey sent prescriptions for Folic Acid, Methotrexate, and Prednisone, but didn't see her refill of Hydrocodone.  Patient requested a return call.

## 2021-01-15 NOTE — Patient Instructions (Signed)
I recommend trying to taper the prednisone medication down somewhat since this can be contributing to your more frequent pneumonia and urinary tract infections. I have sent 5 mg tablets prescription so you can try reducing this a bit to 7.5 mg (1.5 tablets) or 5 mg (1 tablet) daily prednisone. I would not try reducing it any faster than half a tablet amount within 2 weeks since this will often cause body pains and fatigue to become worse.  We will continue the methotrexate currently at 17.5mg  (7 tablets) weekly and folic acid 1mg  daily.  Prednisone tablets What is this medicine? PREDNISONE (PRED ni sone) is a corticosteroid. It is commonly used to treat inflammation of the skin, joints, lungs, and other organs. Common conditions treated include asthma, allergies, and arthritis. It is also used for other conditions, such as blood disorders and diseases of the adrenal glands. This medicine may be used for other purposes; ask your health care provider or pharmacist if you have questions. COMMON BRAND NAME(S): Deltasone, Predone, Sterapred, Sterapred DS What should I tell my health care provider before I take this medicine? They need to know if you have any of these conditions:  Cushing's syndrome  diabetes  glaucoma  heart disease  high blood pressure  infection (especially a virus infection such as chickenpox, cold sores, or herpes)  kidney disease  liver disease  mental illness  myasthenia gravis  osteoporosis  seizures  stomach or intestine problems  thyroid disease  an unusual or allergic reaction to lactose, prednisone, other medicines, foods, dyes, or preservatives  pregnant or trying to get pregnant  breast-feeding How should I use this medicine? Take this medicine by mouth with a glass of water. Follow the directions on the prescription label. Take this medicine with food. If you are taking this medicine once a day, take it in the morning. Do not take more medicine  than you are told to take. Do not suddenly stop taking your medicine because you may develop a severe reaction. Your doctor will tell you how much medicine to take. If your doctor wants you to stop the medicine, the dose may be slowly lowered over time to avoid any side effects. Talk to your pediatrician regarding the use of this medicine in children. Special care may be needed. Overdosage: If you think you have taken too much of this medicine contact a poison control center or emergency room at once. NOTE: This medicine is only for you. Do not share this medicine with others. What if I miss a dose? If you miss a dose, take it as soon as you can. If it is almost time for your next dose, talk to your doctor or health care professional. You may need to miss a dose or take an extra dose. Do not take double or extra doses without advice. What may interact with this medicine? Do not take this medicine with any of the following medications:  metyrapone  mifepristone This medicine may also interact with the following medications:  aminoglutethimide  amphotericin B  aspirin and aspirin-like medicines  barbiturates  certain medicines for diabetes, like glipizide or glyburide  cholestyramine  cholinesterase inhibitors  cyclosporine  digoxin  diuretics  ephedrine  female hormones, like estrogens and birth control pills  isoniazid  ketoconazole  NSAIDS, medicines for pain and inflammation, like ibuprofen or naproxen  phenytoin  rifampin  toxoids  vaccines  warfarin This list may not describe all possible interactions. Give your health care provider a list of all  the medicines, herbs, non-prescription drugs, or dietary supplements you use. Also tell them if you smoke, drink alcohol, or use illegal drugs. Some items may interact with your medicine. What should I watch for while using this medicine? Visit your doctor or health care professional for regular checks on your  progress. If you are taking this medicine over a prolonged period, carry an identification card with your name and address, the type and dose of your medicine, and your doctor's name and address. This medicine may increase your risk of getting an infection. Tell your doctor or health care professional if you are around anyone with measles or chickenpox, or if you develop sores or blisters that do not heal properly. If you are going to have surgery, tell your doctor or health care professional that you have taken this medicine within the last twelve months. Ask your doctor or health care professional about your diet. You may need to lower the amount of salt you eat. This medicine may increase blood sugar. Ask your healthcare provider if changes in diet or medicines are needed if you have diabetes. What side effects may I notice from receiving this medicine? Side effects that you should report to your doctor or health care professional as soon as possible:  allergic reactions like skin rash, itching or hives, swelling of the face, lips, or tongue  changes in emotions or moods  changes in vision  depressed mood  eye pain  fever or chills, cough, sore throat, pain or difficulty passing urine  signs and symptoms of high blood sugar such as being more thirsty or hungry or having to urinate more than normal. You may also feel very tired or have blurry vision.  swelling of ankles, feet Side effects that usually do not require medical attention (report to your doctor or health care professional if they continue or are bothersome):  confusion, excitement, restlessness  headache  nausea, vomiting  skin problems, acne, thin and shiny skin  trouble sleeping  weight gain This list may not describe all possible side effects. Call your doctor for medical advice about side effects. You may report side effects to FDA at 1-800-FDA-1088. Where should I keep my medicine? Keep out of the reach of  children. Store at room temperature between 15 and 30 degrees C (59 and 86 degrees F). Protect from light. Keep container tightly closed. Throw away any unused medicine after the expiration date.  Methotrexate tablets What is this medicine? METHOTREXATE (METH oh TREX ate) is a chemotherapy drug used to treat cancer including breast cancer, leukemia, and lymphoma. This medicine can also be used to treat psoriasis and certain kinds of arthritis. This medicine may be used for other purposes; ask your health care provider or pharmacist if you have questions. COMMON BRAND NAME(S): Rheumatrex, Trexall What should I tell my health care provider before I take this medicine? They need to know if you have any of these conditions:  fluid in the stomach area or lungs  if you often drink alcohol  infection or immune system problems  kidney disease or on hemodialysis  liver disease  low blood counts, like low white cell, platelet, or red cell counts  lung disease  radiation therapy  stomach ulcers  ulcerative colitis  an unusual or allergic reaction to methotrexate, other medicines, foods, dyes, or preservatives  pregnant or trying to get pregnant  breast-feeding How should I use this medicine? Take this medicine by mouth with a glass of water. Follow the directions  on the prescription label. Take your medicine at regular intervals. Do not take it more often than directed. Do not stop taking except on your doctor's advice. Make sure you know why you are taking this medicine and how often you should take it. If this medicine is used for a condition that is not cancer, like arthritis or psoriasis, it should be taken weekly, NOT daily. Taking this medicine more often than directed can cause serious side effects, even death. Talk to your healthcare provider about safe handling and disposal of this medicine. You may need to take special precautions. Talk to your pediatrician regarding the use of  this medicine in children. While this drug may be prescribed for selected conditions, precautions do apply. Overdosage: If you think you have taken too much of this medicine contact a poison control center or emergency room at once. NOTE: This medicine is only for you. Do not share this medicine with others. What if I miss a dose? If you miss a dose, talk with your doctor or health care professional. Do not take double or extra doses. What may interact with this medicine? Do not take this medicine with any of the following medications:  acitretin This medicine may also interact with the following medication:  aspirin and aspirin-like medicines including salicylates  azathioprine  certain antibiotics like penicillins, tetracycline, and chloramphenicol  certain medicines that treat or prevent blood clots like warfarin, apixaban, dabigatran, and rivaroxaban  certain medicines for stomach problems like esomeprazole, omeprazole, pantoprazole  cyclosporine  dapsone  diuretics  gold  hydroxychloroquine  live virus vaccines  medicines for infection like acyclovir, adefovir, amphotericin B, bacitracin, cidofovir, foscarnet, ganciclovir, gentamicin, pentamidine, vancomycin  mercaptopurine  NSAIDs, medicines for pain and inflammation, like ibuprofen or naproxen  other cytotoxic agents  pamidronate  pemetrexed  penicillamine  phenylbutazone  phenytoin  probenecid  pyrimethamine  retinoids such as isotretinoin and tretinoin  steroid medicines like prednisone or cortisone  sulfonamides like sulfasalazine and trimethoprim/sulfamethoxazole  theophylline  zoledronic acid This list may not describe all possible interactions. Give your health care provider a list of all the medicines, herbs, non-prescription drugs, or dietary supplements you use. Also tell them if you smoke, drink alcohol, or use illegal drugs. Some items may interact with your medicine. What should I  watch for while using this medicine? Avoid alcoholic drinks. This medicine can make you more sensitive to the sun. Keep out of the sun. If you cannot avoid being in the sun, wear protective clothing and use sunscreen. Do not use sun lamps or tanning beds/booths. You may need blood work done while you are taking this medicine. Call your doctor or health care professional for advice if you get a fever, chills or sore throat, or other symptoms of a cold or flu. Do not treat yourself. This drug decreases your body's ability to fight infections. Try to avoid being around people who are sick. This medicine may increase your risk to bruise or bleed. Call your doctor or health care professional if you notice any unusual bleeding. Be careful brushing or flossing your teeth or using a toothpick because you may get an infection or bleed more easily. If you have any dental work done, tell your dentist you are receiving this medicine. Check with your doctor or health care professional if you get an attack of severe diarrhea, nausea and vomiting, or if you sweat a lot. The loss of too much body fluid can make it dangerous for you to take this  medicine. Talk to your doctor about your risk of cancer. You may be more at risk for certain types of cancers if you take this medicine. Do not become pregnant while taking this medicine or for 6 months after stopping it. Women should inform their health care provider if they wish to become pregnant or think they might be pregnant. Men should not father a child while taking this medicine and for 3 months after stopping it. There is potential for serious harm to an unborn child. Talk to your health care provider for more information. Do not breast-feed an infant while taking this medicine or for 1 week after stopping it. This medicine may make it more difficult to get pregnant or father a child. Talk to your health care provider if you are concerned about your fertility. What side  effects may I notice from receiving this medicine? Side effects that you should report to your doctor or health care professional as soon as possible:  allergic reactions like skin rash, itching or hives, swelling of the face, lips, or tongue  breathing problems or shortness of breath  diarrhea  dry, nonproductive cough  low blood counts - this medicine may decrease the number of white blood cells, red blood cells and platelets. You may be at increased risk for infections and bleeding.  mouth sores  redness, blistering, peeling or loosening of the skin, including inside the mouth  signs of infection - fever or chills, cough, sore throat, pain or trouble passing urine  signs and symptoms of bleeding such as bloody or black, tarry stools; red or dark-brown urine; spitting up blood or brown material that looks like coffee grounds; red spots on the skin; unusual bruising or bleeding from the eye, gums, or nose  signs and symptoms of kidney injury like trouble passing urine or change in the amount of urine  signs and symptoms of liver injury like dark yellow or brown urine; general ill feeling or flu-like symptoms; light-colored stools; loss of appetite; nausea; right upper belly pain; unusually weak or tired; yellowing of the eyes or skin Side effects that usually do not require medical attention (report to your doctor or health care professional if they continue or are bothersome):  dizziness  hair loss  tiredness  upset stomach  vomiting This list may not describe all possible side effects. Call your doctor for medical advice about side effects. You may report side effects to FDA at 1-800-FDA-1088. Where should I keep my medicine? Keep out of the reach of children and pets. Store at room temperature between 20 and 25 degrees C (68 and 77 degrees F). Protect from light. Get rid of any unused medicine after the expiration date. Talk to your health care provider about how to dispose  of unused medicine. Special directions may apply. NOTE: This sheet is a summary. It may not cover all possible information. If you have questions about this medicine, talk to your doctor, pharmacist, or health care provider.  2021 [employer] (2020-03-25 10:40:39)

## 2021-01-16 ENCOUNTER — Telehealth: Payer: Self-pay

## 2021-01-16 DIAGNOSIS — M059 Rheumatoid arthritis with rheumatoid factor, unspecified: Secondary | ICD-10-CM

## 2021-01-16 DIAGNOSIS — G894 Chronic pain syndrome: Secondary | ICD-10-CM

## 2021-01-16 LAB — CBC WITH DIFFERENTIAL/PLATELET
Absolute Monocytes: 264 cells/uL (ref 200–950)
Basophils Absolute: 18 cells/uL (ref 0–200)
Basophils Relative: 0.2 %
Eosinophils Absolute: 18 cells/uL (ref 15–500)
Eosinophils Relative: 0.2 %
HCT: 43.6 % (ref 35.0–45.0)
Hemoglobin: 15 g/dL (ref 11.7–15.5)
Lymphs Abs: 1920 cells/uL (ref 850–3900)
MCH: 32.6 pg (ref 27.0–33.0)
MCHC: 34.4 g/dL (ref 32.0–36.0)
MCV: 94.8 fL (ref 80.0–100.0)
MPV: 9.3 fL (ref 7.5–12.5)
Monocytes Relative: 2.9 %
Neutro Abs: 6880 cells/uL (ref 1500–7800)
Neutrophils Relative %: 75.6 %
Platelets: 256 10*3/uL (ref 140–400)
RBC: 4.6 10*6/uL (ref 3.80–5.10)
RDW: 15.7 % — ABNORMAL HIGH (ref 11.0–15.0)
Total Lymphocyte: 21.1 %
WBC: 9.1 10*3/uL (ref 3.8–10.8)

## 2021-01-16 LAB — COMPLETE METABOLIC PANEL WITH GFR
AG Ratio: 1.5 (calc) (ref 1.0–2.5)
ALT: 17 U/L (ref 6–29)
AST: 13 U/L (ref 10–35)
Albumin: 3.7 g/dL (ref 3.6–5.1)
Alkaline phosphatase (APISO): 35 U/L (ref 31–125)
BUN: 12 mg/dL (ref 7–25)
CO2: 28 mmol/L (ref 20–32)
Calcium: 9.2 mg/dL (ref 8.6–10.2)
Chloride: 105 mmol/L (ref 98–110)
Creat: 0.81 mg/dL (ref 0.50–1.10)
GFR, Est African American: 100 mL/min/{1.73_m2} (ref >=60)
GFR, Est Non African American: 86 mL/min/{1.73_m2} (ref >=60)
Globulin: 2.5 g/dL (calc) (ref 1.9–3.7)
Glucose, Bld: 101 mg/dL — ABNORMAL HIGH (ref 65–99)
Potassium: 4.6 mmol/L (ref 3.5–5.3)
Sodium: 140 mmol/L (ref 135–146)
Total Bilirubin: 0.4 mg/dL (ref 0.2–1.2)
Total Protein: 6.2 g/dL (ref 6.1–8.1)

## 2021-01-16 LAB — HEPATITIS C ANTIBODY
Hepatitis C Ab: NONREACTIVE
SIGNAL TO CUT-OFF: 0.01 (ref <1.00)

## 2021-01-16 LAB — SEDIMENTATION RATE: Sed Rate: 9 mm/h (ref 0–20)

## 2021-01-16 LAB — HEPATITIS B CORE ANTIBODY, IGM: Hep B C IgM: NONREACTIVE

## 2021-01-16 LAB — RHEUMATOID FACTOR: Rheumatoid fact SerPl-aCnc: 73 IU/mL — ABNORMAL HIGH (ref <14)

## 2021-01-16 LAB — HEPATITIS B SURFACE ANTIGEN: Hepatitis B Surface Ag: NONREACTIVE

## 2021-01-16 LAB — CYCLIC CITRUL PEPTIDE ANTIBODY, IGG: Cyclic Citrullin Peptide Ab: <16 UNITS

## 2021-01-16 NOTE — Telephone Encounter (Signed)
Patient called to let Dr. Dimple Casey know that she has an appointment scheduled with [facility] for 02/12/21 at 9:00 am.  Patient states Dr. Dimple Casey told her he could prescribe pain medication to take until her appointment.  Patient requested a return call.

## 2021-01-16 NOTE — Progress Notes (Signed)
Her rheumatoid factor test is positive which is consistent with RA. The sedimentation rate is normal that would indicate not too much current inflammation, may be that low due to current prednisone dose. Hepatitis screening and liver function tests are all fine so no problems continuing methotrexate as before.

## 2021-01-17 MED ORDER — HYDROCODONE-ACETAMINOPHEN 5-325 MG PO TABS: 1.0000 | ORAL_TABLET | Freq: Two times a day (BID) | ORAL | 0 refills | Status: DC | PRN

## 2021-01-17 NOTE — Telephone Encounter (Signed)
FYI-Spoke with patient, review of PDMP shows previous prescription most recently 5 mg oxycodone from Dr. Iran Ouch on 4/30, patient reports discontinuing this medicine due to excessive side effects from this dose versus 5 mg hydrocodone. Rx sent to pharmacy for 5mg  hydrocodone up to twice daily as needed #30 tablets for continued treatment till her appointment next month.

## 2021-01-27 ENCOUNTER — Encounter: Payer: Self-pay | Admitting: Radiology

## 2021-02-05 ENCOUNTER — Ambulatory Visit (INDEPENDENT_AMBULATORY_CARE_PROVIDER_SITE_OTHER): Payer: Medicare Other

## 2021-02-05 ENCOUNTER — Ambulatory Visit
Admission: EM | Admit: 2021-02-05 | Discharge: 2021-02-05 | Disposition: A | Discharge status: Home / Self Care | Payer: Medicare Other | Attending: Family Medicine | Admitting: Family Medicine

## 2021-02-05 ENCOUNTER — Other Ambulatory Visit: Payer: Self-pay

## 2021-02-05 ENCOUNTER — Encounter: Payer: Self-pay | Admitting: Emergency Medicine

## 2021-02-05 DIAGNOSIS — M25571 Pain in right ankle and joints of right foot: Secondary | ICD-10-CM | POA: Diagnosis not present

## 2021-02-05 DIAGNOSIS — S93401A Sprain of unspecified ligament of right ankle, initial encounter: Secondary | ICD-10-CM | POA: Diagnosis not present

## 2021-02-05 MED ORDER — CYCLOBENZAPRINE HCL 10 MG PO TABS: 10.0000 mg | ORAL_TABLET | Freq: Two times a day (BID) | ORAL | 0 refills | Status: DC | PRN

## 2021-02-05 MED ORDER — ACETAMINOPHEN 325 MG PO TABS
650.0000 mg | ORAL_TABLET | Freq: Once | ORAL | Status: AC
  Administered 2021-02-05: 650 mg via ORAL

## 2021-02-05 MED ORDER — ACETAMINOPHEN 325 MG PO TABS: 975.0000 mg | ORAL_TABLET | Freq: Once | ORAL | Status: DC

## 2021-02-05 MED ORDER — IBUPROFEN 600 MG PO TABS: 600.0000 mg | ORAL_TABLET | Freq: Four times a day (QID) | ORAL | 0 refills | Status: DC | PRN

## 2021-02-05 NOTE — ED Provider Notes (Addendum)
[facility]    CSN: [id number] Arrival date & time: 02/05/21  4098      History   Chief Complaint Chief Complaint  Patient presents with  . Ankle Pain    HPI Regina Patton is a 50 y.o. female.   Reports right lateral ankle pain and swelling since this morning. Reports that she fell off the porch this morning. Has not attempted OTC treatment. Denies previous symptoms. Denies numbness, tingling, loss of strength in the foot, erythema, bruising, other symptoms.  ROS per HPI  The history is provided by the patient.  Ankle Pain   Past Medical History:  Diagnosis Date  . C. difficile diarrhea   . Cervical radiculopathy   . Chronic abdominal pain   . Chronic back pain   . Chronic knee pain   . Chronic neck pain   . Diabetes mellitus without complication (HCC)    diet controlled  . Fibromyalgia   . Hypertension   . Rheumatoid arthritis (HCC)    "Rhematoid factor positive but no symptoms" per EPIC notes 2012  . Rib pain on right side    chronic  . Sciatic pain    right    Patient Active Problem List   Diagnosis Date Noted  . Seropositive rheumatoid arthritis (HCC) 01/15/2021  . High risk medication use 01/15/2021  . Cholecystitis with cholelithiasis 10/17/2015  . C. difficile colitis 04/19/2015  . Essential hypertension 04/05/2015  . Chronic pain syndrome 04/05/2015    Past Surgical History:  Procedure Laterality Date  . BACK SURGERY    . BLADDER SURGERY    . CHOLECYSTECTOMY N/A 10/18/2015   Procedure: LAPAROSCOPIC CHOLECYSTECTOMY;  Surgeon: Franky Macho, MD;  Location: [facility];  Service: General;  Laterality: N/A;  . TUBAL LIGATION    . URETHRAL DIVERTICULECTOMY      OB History    Gravida  3   Para  3   Term  3   Preterm      AB      Living  3     SAB      IAB      Ectopic      Multiple      Live Births               Home Medications    Prior to Admission medications   Medication Sig Start Date End Date Taking?  Authorizing Provider  cyclobenzaprine (FLEXERIL) 10 MG tablet Take 1 tablet (10 mg total) by mouth 2 (two) times daily as needed for muscle spasms. 02/05/21  Yes Moshe Cipro, NP  albuterol (PROVENTIL HFA;VENTOLIN HFA) 108 (90 Base) MCG/ACT inhaler Inhale 2 puffs into the lungs every 6 (six) hours as needed for wheezing or shortness of breath. Patient not taking: No sig reported 09/19/17   Nita Sickle, MD  folic acid (FOLVITE) 1 MG tablet Take 1 tablet (1 mg total) by mouth daily. 01/15/21   Rice, Jamesetta Orleans, MD  HYDROcodone-acetaminophen (NORCO/VICODIN) 5-325 MG tablet Take 1 tablet by mouth 2 (two) times daily as needed for moderate pain. 01/17/21 02/16/21  Fuller Plan, MD  ipratropium-albuterol (DUONEB) 0.5-2.5 (3) MG/3ML SOLN Take 3 mLs by nebulization every 6 (six) hours as needed. 12/16/20   Bing Neighbors, FNP  methotrexate (RHEUMATREX) 2.5 MG tablet Take 7 tablets (17.5 mg total) by mouth once a week. 7 01/15/21 04/15/21  Rice, Jamesetta Orleans, MD  pantoprazole (PROTONIX) 20 MG tablet Take 20 mg by mouth daily. 01/02/21   [provider]  phenazopyridine (PYRIDIUM) 200 MG tablet Take 1 tablet (200 mg total) by mouth 3 (three) times daily as needed for pain. Patient not taking: Reported on 01/15/2021 11/19/20   Petrucelli, Pleas Koch, PA-C  predniSONE (DELTASONE) 5 MG tablet Take 1.5 tablets (7.5 mg total) by mouth daily with breakfast. 01/15/21 04/15/21  Fuller Plan, MD  prochlorperazine (COMPAZINE) 10 MG tablet Take 1 tablet (10 mg total) by mouth 2 (two) times daily as needed for nausea or vomiting. Patient not taking: Reported on 01/15/2021 11/19/20   Petrucelli, Pleas Koch, PA-C  promethazine-dextromethorphan (PROMETHAZINE-DM) 6.25-15 MG/5ML syrup Take 5 mLs by mouth 4 (four) times daily as needed for cough. Patient not taking: Reported on 01/15/2021 12/16/20   Bing Neighbors, FNP  rOPINIRole (REQUIP) 0.5 MG tablet Take 0.5 mg by mouth 3 (three) times  daily. Patient not taking: Reported on 01/15/2021 05/01/20   [provider]  dicyclomine (BENTYL) 20 MG tablet Take 1 tablet (20 mg total) by mouth 2 (two) times daily. Patient not taking: Reported on 11/26/2015 10/12/15 11/26/15  Gilda Crease, MD  diphenhydrAMINE (BENADRYL) 25 MG tablet Take 1 tablet (25 mg total) by mouth every 4 (four) hours as needed for itching. Patient not taking: Reported on 04/25/2015 04/11/15 05/04/15  Triplett, Babette Relic, PA-C  hyoscyamine (LEVSIN SL) 0.125 MG SL tablet Place 1 tablet (0.125 mg total) under the tongue every 4 (four) hours as needed. Patient not taking: Reported on 09/26/2015 06/04/15 10/12/15  Gelene Mink, NP  metoCLOPramide (REGLAN) 10 MG tablet Take 1 tablet (10 mg total) by mouth every 8 (eight) hours as needed for nausea or vomiting. Patient not taking: Reported on 11/26/2015 10/13/15 11/26/15  Loren Racer, MD  ranitidine (ZANTAC) 150 MG tablet Take 1 tablet (150 mg total) by mouth 2 (two) times daily. Patient not taking: Reported on 11/26/2015 10/12/15 11/26/15  Gilda Crease, MD    Family History Family History  Problem Relation Age of Onset  . Cancer Mother   . Diabetes Mother   . Lupus Mother   . Thyroid disease Mother   . Colon cancer Father        AFTER AGE 16  . Crohn's disease Sister     Social History Social History   Tobacco Use  . Smoking status: Never Smoker  . Smokeless tobacco: Never Used  Vaping Use  . Vaping Use: Never used  Substance Use Topics  . Alcohol use: No  . Drug use: No     Allergies   Mango flavor, Penicillins, Tramadol, Zofran [ondansetron hcl], Bactrim [sulfamethoxazole-trimethoprim], Keflex [cephalexin], and Toradol [ketorolac tromethamine]   Review of Systems Review of Systems   Physical Exam Triage Vital Signs ED Triage Vitals  Enc Vitals Group     BP 02/05/21 1014 (!) 147/89     Pulse Rate 02/05/21 1014 63     Resp 02/05/21 1014 19     Temp 02/05/21 1014 98 F (36.7 C)      Temp Source 02/05/21 1014 Oral     SpO2 02/05/21 1014 95 %     Weight --      Height --      Head Circumference --      Peak Flow --      Pain Score 02/05/21 1015 10     Pain Loc --      Pain Edu? --      Excl. in GC? --    No data found.  Updated Vital Signs  BP (!) 147/89 (BP Location: Right Arm)   Pulse 63   Temp 98 F (36.7 C) (Oral)   Resp 19   SpO2 95%       Physical Exam Vitals and nursing note reviewed.  Constitutional:      General: She is not in acute distress.    Appearance: Normal appearance. She is well-developed. She is not ill-appearing.  HENT:     Head: Normocephalic and atraumatic.     Nose: Nose normal.     Mouth/Throat:     Mouth: Mucous membranes are moist.     Pharynx: Oropharynx is clear.  Eyes:     Extraocular Movements: Extraocular movements intact.     Conjunctiva/sclera: Conjunctivae normal.     Pupils: Pupils are equal, round, and reactive to light.  Cardiovascular:     Rate and Rhythm: Normal rate and regular rhythm.  Pulmonary:     Effort: Pulmonary effort is normal. No respiratory distress.  Musculoskeletal:        General: Swelling and tenderness present.     Cervical back: Normal range of motion and neck supple.     Comments: Lateral aspect of right ankle  Skin:    General: Skin is warm and dry.     Capillary Refill: Capillary refill takes less than 2 seconds.     Findings: Bruising and erythema present.     Comments: Lateral aspect of right ankle  Neurological:     General: No focal deficit present.     Mental Status: She is alert and oriented to person, place, and time.  Psychiatric:        Mood and Affect: Mood normal.        Behavior: Behavior normal.        Thought Content: Thought content normal.      UC Treatments / Results  Labs (all labs ordered are listed, but only abnormal results are displayed) Labs Reviewed - No data to display  EKG   Radiology DG Ankle Complete Right  Result Date:  02/05/2021 CLINICAL DATA:  Fall.  Pain and swelling. EXAM: RIGHT ANKLE - COMPLETE 3+ VIEW COMPARISON:  08/29/2015. FINDINGS: Soft tissue swelling noted over the lateral malleolus. No evidence of fracture or dislocation. No acute bony abnormality. Stable sclerotic density noted the distal tibia most likely bone island. IMPRESSION: Soft tissue swelling noted over the lateral malleolus. No acute bony abnormality. Electronically Signed   By: Maisie Fus  Register   On: 02/05/2021 11:07    Procedures Procedures (including critical care time)  Medications Ordered in UC Medications  acetaminophen (TYLENOL) tablet 650 mg (650 mg Oral Given 02/05/21 1120)    Initial Impression / Assessment and Plan / UC Course  I have reviewed the triage vital signs and the nursing notes.  Pertinent labs & imaging results that were available during my care of the patient were reviewed by me and considered in my medical decision making (see chart for details).    Right ankle pain Sprain of R ankle  Tylenol given in office for pain Xray negative for fracture/misalignment ASO brace applied in office Crutches given in office May ice the area May take home pain medication as needed Follow up with orthopedics if symptoms are persisting    Final Clinical Impressions(s) / UC Diagnoses   Final diagnoses:  Acute right ankle pain  Sprain of right ankle, unspecified ligament, initial encounter     Discharge Instructions     Xray is negative for fracture or misalignment  Take home pain medication as needed for pain  I have sent in flexeril for you to take twice a day as needed for muscle spasms. This medication can make you sleepy. Do not drive or operate heavy machinery with this medication.  Keep the ankle elevated  May use ice to the area  Follow up with this orthopedics if symptoms are persisting.  Follow up in the ER for high fever, trouble swallowing, trouble breathing, other concerning  symptoms.     ED Prescriptions    Medication Sig Dispense Auth. Provider   ibuprofen (ADVIL) 600 MG tablet  (Status: Discontinued) Take 1 tablet (600 mg total) by mouth every 6 (six) hours as needed. 30 tablet Moshe Cipro, NP   cyclobenzaprine (FLEXERIL) 10 MG tablet Take 1 tablet (10 mg total) by mouth 2 (two) times daily as needed for muscle spasms. 20 tablet Moshe Cipro, NP     I have reviewed the PDMP during this encounter.   Moshe Cipro, NP 02/05/21 1211    Moshe Cipro, NP 02/05/21 1212

## 2021-02-05 NOTE — ED Triage Notes (Signed)
Pain to RT ankle/foot after tripping and falling off the porch this morning.

## 2021-02-05 NOTE — Discharge Instructions (Addendum)
Xray is negative for fracture or misalignment  Take home pain medication as needed for pain  I have sent in flexeril for you to take twice a day as needed for muscle spasms. This medication can make you sleepy. Do not drive or operate heavy machinery with this medication.  Keep the ankle elevated  May use ice to the area  Follow up with this orthopedics if symptoms are persisting.  Follow up in the ER for high fever, trouble swallowing, trouble breathing, other concerning symptoms.

## 2021-02-07 ENCOUNTER — Encounter: Payer: Self-pay | Admitting: Physical Medicine & Rehabilitation

## 2021-02-11 ENCOUNTER — Other Ambulatory Visit: Payer: Self-pay

## 2021-02-11 ENCOUNTER — Encounter: Payer: Self-pay | Admitting: Orthopaedic Surgery

## 2021-02-11 ENCOUNTER — Ambulatory Visit (INDEPENDENT_AMBULATORY_CARE_PROVIDER_SITE_OTHER): Payer: Medicare Other | Admitting: Orthopaedic Surgery

## 2021-02-11 VITALS — BP 155/106 | HR 79 | Ht 60.5 in | Wt 193.0 lb

## 2021-02-11 DIAGNOSIS — G894 Chronic pain syndrome: Secondary | ICD-10-CM | POA: Diagnosis not present

## 2021-02-11 DIAGNOSIS — M059 Rheumatoid arthritis with rheumatoid factor, unspecified: Secondary | ICD-10-CM | POA: Diagnosis not present

## 2021-02-11 DIAGNOSIS — S96911A Strain of unspecified muscle and tendon at ankle and foot level, right foot, initial encounter: Secondary | ICD-10-CM | POA: Diagnosis not present

## 2021-02-11 MED ORDER — HYDROCODONE-ACETAMINOPHEN 5-325 MG PO TABS: ORAL_TABLET | ORAL | 0 refills | Status: DC

## 2021-02-11 NOTE — Progress Notes (Signed)
Subjective:    Patient ID: Regina Patton, female    DOB: [birth year], 50 y.o.   MRN: [id number]  HPI She fell off her porch on 02-05-21 and hurt her right ankle.  She was seen at Urgent Care.  I have reviewed the notes and X-rays.  She has no fracture, lateral swelling.  She was given ankle brace.  She still has pain,  She limps.  She has no other injury.  I have independently reviewed and interpreted x-rays of this patient done at another site by another physician or qualified health professional.  She has bruising of the right lateral ankle and swelling.  She has rheumatoid arthritis and chronic pain.   Review of Systems  Constitutional: Positive for activity change.  Musculoskeletal: Positive for arthralgias, back pain, gait problem and joint swelling.  All other systems reviewed and are negative.  For Review of Systems, all other systems reviewed and are negative.  The following is a summary of the past history medically, past history surgically, known current medicines, social history and family history.  This information is gathered electronically by the computer from prior information and documentation.  I review this each visit and have found including this information at this point in the chart is beneficial and informative.   Past Medical History:  Diagnosis Date  . C. difficile diarrhea   . Cervical radiculopathy   . Chronic abdominal pain   . Chronic back pain   . Chronic knee pain   . Chronic neck pain   . Diabetes mellitus without complication (HCC)    diet controlled  . Fibromyalgia   . Hypertension   . Rheumatoid arthritis (HCC)    "Rhematoid factor positive but no symptoms" per EPIC notes 2012  . Rib pain on right side    chronic  . Sciatic pain    right    Past Surgical History:  Procedure Laterality Date  . BACK SURGERY    . BLADDER SURGERY    . CHOLECYSTECTOMY N/A 10/18/2015   Procedure: LAPAROSCOPIC CHOLECYSTECTOMY;  Surgeon: Franky Macho, MD;   Location: [facility];  Service: General;  Laterality: N/A;  . TUBAL LIGATION    . URETHRAL DIVERTICULECTOMY      Current Outpatient Medications on File Prior to Visit  Medication Sig Dispense Refill  . cyclobenzaprine (FLEXERIL) 10 MG tablet Take 1 tablet (10 mg total) by mouth 2 (two) times daily as needed for muscle spasms. 20 tablet 0  . folic acid (FOLVITE) 1 MG tablet Take 1 tablet (1 mg total) by mouth daily. 90 tablet 0  . methotrexate (RHEUMATREX) 2.5 MG tablet Take 7 tablets (17.5 mg total) by mouth once a week. 7 84 tablet 0  . pantoprazole (PROTONIX) 20 MG tablet Take 20 mg by mouth daily.    Marland Kitchen oxyCODONE (OXY IR/ROXICODONE) 5 MG immediate release tablet Take 5 mg by mouth 3 (three) times daily as needed. (Patient not taking: Reported on 02/11/2021)    . [DISCONTINUED] dicyclomine (BENTYL) 20 MG tablet Take 1 tablet (20 mg total) by mouth 2 (two) times daily. (Patient not taking: Reported on 11/26/2015) 20 tablet 0  . [DISCONTINUED] diphenhydrAMINE (BENADRYL) 25 MG tablet Take 1 tablet (25 mg total) by mouth every 4 (four) hours as needed for itching. (Patient not taking: Reported on 04/25/2015) 30 tablet 0  . [DISCONTINUED] hyoscyamine (LEVSIN SL) 0.125 MG SL tablet Place 1 tablet (0.125 mg total) under the tongue every 4 (four) hours as needed. (Patient not taking: Reported  on 09/26/2015) 30 tablet 0  . [DISCONTINUED] metoCLOPramide (REGLAN) 10 MG tablet Take 1 tablet (10 mg total) by mouth every 8 (eight) hours as needed for nausea or vomiting. (Patient not taking: Reported on 11/26/2015) 15 tablet 0  . [DISCONTINUED] ranitidine (ZANTAC) 150 MG tablet Take 1 tablet (150 mg total) by mouth 2 (two) times daily. (Patient not taking: Reported on 11/26/2015) 60 tablet 0   No current facility-administered medications on file prior to visit.    Social History   Socioeconomic History  . Marital status: Divorced    Spouse name: Not on file  . Number of children: Not on file  . Years of education:  Not on file  . Highest education level: Not on file  Occupational History  . Not on file  Tobacco Use  . Smoking status: Never Smoker  . Smokeless tobacco: Never Used  Vaping Use  . Vaping Use: Never used  Substance and Sexual Activity  . Alcohol use: No  . Drug use: No  . Sexual activity: Yes    Birth control/protection: Surgical  Other Topics Concern  . Not on file  Social History Narrative  . Not on file   Social Determinants of Health   Financial Resource Strain: Not on file  Food Insecurity: Not on file  Transportation Needs: Not on file  Physical Activity: Not on file  Stress: Not on file  Social Connections: Not on file  Intimate Partner Violence: Not on file    Family History  Problem Relation Age of Onset  . Cancer Mother   . Diabetes Mother   . Lupus Mother   . Thyroid disease Mother   . Colon cancer Father        AFTER AGE 94  . Crohn's disease Sister     BP (!) 155/106   Pulse 79   Ht 5' 0.5" (1.537 m)   Wt 193 lb (87.5 kg)   BMI 37.07 kg/m   Body mass index is 37.07 kg/m.      Objective:   Physical Exam Vitals and nursing note reviewed. Exam conducted with a chaperone present.  Constitutional:      Appearance: She is well-developed.  HENT:     Head: Normocephalic and atraumatic.  Eyes:     Conjunctiva/sclera: Conjunctivae normal.     Pupils: Pupils are equal, round, and reactive to light.  Cardiovascular:     Rate and Rhythm: Normal rate and regular rhythm.  Pulmonary:     Effort: Pulmonary effort is normal.  Abdominal:     Palpations: Abdomen is soft.  Musculoskeletal:     Cervical back: Normal range of motion and neck supple.       Feet:  Skin:    General: Skin is warm and dry.  Neurological:     Mental Status: She is alert and oriented to person, place, and time.     Cranial Nerves: No cranial nerve deficit.     Motor: No abnormal muscle tone.     Coordination: Coordination normal.     Deep Tendon Reflexes: Reflexes are  normal and symmetric. Reflexes normal.  Psychiatric:        Behavior: Behavior normal.        Thought Content: Thought content normal.        Judgment: Judgment normal.           Assessment & Plan:   Encounter Diagnoses  Name Primary?  . Strain of right ankle, initial encounter Yes  . Seropositive  rheumatoid arthritis (HCC)   . Chronic pain syndrome    I have given instructions for Contrast Baths.  I have told her to continue the ankle brace.  Elevate, ice, use rubs.  Return in two weeks.  I have reviewed the [place] Controlled Substance Reporting System web site prior to prescribing narcotic medicine for this patient.   Call if any problem.  Precautions discussed.   Electronically Signed Darreld Mclean, MD 6/7/20228:48 AM

## 2021-02-20 ENCOUNTER — Telehealth: Payer: Self-pay | Admitting: Internal Medicine

## 2021-02-20 DIAGNOSIS — M059 Rheumatoid arthritis with rheumatoid factor, unspecified: Secondary | ICD-10-CM

## 2021-02-20 DIAGNOSIS — F1190 Opioid use, unspecified, uncomplicated: Secondary | ICD-10-CM

## 2021-02-20 DIAGNOSIS — G894 Chronic pain syndrome: Secondary | ICD-10-CM

## 2021-02-20 NOTE — Telephone Encounter (Signed)
Patient calling requesting refill on Hydrocodone. Patient uses [facility] in [place]. Patient had scheduled appointment with Pain Management, on 02/12/2021 but had to miss it due to testing positive for COVID on 02/10/2021. Their next available appointment is not until September, but they are trying to find her something sooner.

## 2021-02-25 ENCOUNTER — Ambulatory Visit (INDEPENDENT_AMBULATORY_CARE_PROVIDER_SITE_OTHER): Payer: Medicare Other | Admitting: Orthopaedic Surgery

## 2021-02-25 ENCOUNTER — Other Ambulatory Visit: Payer: Self-pay

## 2021-02-25 ENCOUNTER — Encounter: Payer: Self-pay | Admitting: Orthopaedic Surgery

## 2021-02-25 VITALS — BP 162/105 | HR 75 | Ht 60.5 in | Wt 193.0 lb

## 2021-02-25 DIAGNOSIS — S96911D Strain of unspecified muscle and tendon at ankle and foot level, right foot, subsequent encounter: Secondary | ICD-10-CM

## 2021-02-25 DIAGNOSIS — G894 Chronic pain syndrome: Secondary | ICD-10-CM

## 2021-02-25 DIAGNOSIS — M059 Rheumatoid arthritis with rheumatoid factor, unspecified: Secondary | ICD-10-CM | POA: Diagnosis not present

## 2021-02-25 DIAGNOSIS — S96911A Strain of unspecified muscle and tendon at ankle and foot level, right foot, initial encounter: Secondary | ICD-10-CM

## 2021-02-25 NOTE — Progress Notes (Signed)
My ankle still hurts.  She has pain in the right ankle She has no new trauma.  Swelling is almost gone. She is not tender over the anterior talofibular ligament, ROM is full. She has her ankle brace. She is wearing flip flops.  She has no redness.  NV intact.  Encounter Diagnoses  Name Primary?   Strain of right ankle, initial encounter Yes   Seropositive rheumatoid arthritis (HCC)    Chronic pain syndrome    Call if any problem.  Precautions discussed.  Return In one month.  Continue brace and contrast baths.  Electronically Signed Darreld Mclean, MD 6/21/20229:19 AM

## 2021-02-25 NOTE — Telephone Encounter (Signed)
Patient has called again to check on status of refill request for Hydrocodone. Please call patient to advise.

## 2021-02-27 ENCOUNTER — Encounter: Payer: Self-pay | Admitting: Radiology

## 2021-02-27 DIAGNOSIS — F1190 Opioid use, unspecified, uncomplicated: Secondary | ICD-10-CM | POA: Insufficient documentation

## 2021-02-27 MED ORDER — HYDROCODONE-ACETAMINOPHEN 5-325 MG PO TABS: 1.0000 | ORAL_TABLET | Freq: Every day | ORAL | 0 refills | Status: DC

## 2021-03-05 ENCOUNTER — Encounter (INDEPENDENT_AMBULATORY_CARE_PROVIDER_SITE_OTHER): Payer: Self-pay | Admitting: *Deleted

## 2021-03-18 ENCOUNTER — Other Ambulatory Visit: Payer: Self-pay

## 2021-03-18 ENCOUNTER — Ambulatory Visit
Admission: EM | Admit: 2021-03-18 | Discharge: 2021-03-18 | Disposition: A | Discharge status: Home / Self Care | Payer: Medicare Other | Attending: Family Medicine | Admitting: Family Medicine

## 2021-03-18 DIAGNOSIS — N390 Urinary tract infection, site not specified: Secondary | ICD-10-CM | POA: Diagnosis present

## 2021-03-18 LAB — POCT URINALYSIS DIP (MANUAL ENTRY)
Bilirubin, UA: NEGATIVE
Glucose, UA: NEGATIVE mg/dL
Ketones, POC UA: NEGATIVE mg/dL
Nitrite, UA: NEGATIVE
Protein Ur, POC: 100 mg/dL — AB
Spec Grav, UA: 1.02 (ref 1.010–1.025)
Urobilinogen, UA: 1 E.U./dL
pH, UA: 6 (ref 5.0–8.0)

## 2021-03-18 MED ORDER — CIPROFLOXACIN HCL 500 MG PO TABS: 500.0000 mg | ORAL_TABLET | Freq: Two times a day (BID) | ORAL | 0 refills | Status: AC

## 2021-03-18 NOTE — ED Triage Notes (Signed)
Pt states that she has some urinary frequency, pain with urination and lower back pain. X4 days

## 2021-03-18 NOTE — ED Provider Notes (Addendum)
[facility]    CSN: [id number] Arrival date & time: 03/18/21  1248      History   Chief Complaint Chief Complaint  Patient presents with   Urinary Frequency    Pt states that she has urinary frequency and pain with urination. X4 days    HPI Regina Patton is a 50 y.o. female.   HPI Patient presents today with urinary frequency and dysuria x4 days.  Patient has a history of recurrent urinary tract infections with acute pyelonephritis diagnosed back in March.  Current symptoms have been present for total 4 days.  Patient reports that on day 1 of symptoms she attempted relief with Azo's however pain has gradually worsened.  She has been attempting to drink water to flush bladder without relief.  She endorses nausea and bilateral lower back pain. Past Medical History:  Diagnosis Date   C. 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 difficile colitis 04/19/2015   Essential hypertension 04/05/2015   Chronic pain syndrome 04/05/2015    Past Surgical History:  Procedure Laterality Date   BACK SURGERY     BLADDER SURGERY     CHOLECYSTECTOMY N/A 10/18/2015   Procedure: LAPAROSCOPIC CHOLECYSTECTOMY;  Surgeon: Franky Macho, MD;  Location: [facility];  Service: General;  Laterality: N/A;   TUBAL LIGATION     URETHRAL DIVERTICULECTOMY      OB History     Gravida  3   Para  3   Term  3   Preterm       AB      Living  3      SAB      IAB      Ectopic      Multiple      Live Births               Home Medications    Prior to Admission medications   Medication Sig Start Date End Date Taking? Authorizing Provider  ciprofloxacin (CIPRO) 500 MG tablet Take 1 tablet (500 mg total) by mouth 2 (two) times daily for 7 days. 03/18/21 03/25/21 Yes Bing Neighbors, FNP  cyclobenzaprine (FLEXERIL) 10 MG tablet Take 1 tablet (10 mg total) by mouth 2 (two) times daily as needed for muscle spasms. 02/05/21  Yes Moshe Cipro, NP  folic acid (FOLVITE) 1 MG tablet Take 1 tablet (1 mg total) by mouth daily. 01/15/21  Yes Rice, Jamesetta Orleans, MD  HYDROcodone-acetaminophen (NORCO/VICODIN) 5-325 MG tablet Take 1 tablet by mouth daily. 02/27/21  Yes Rice, Jamesetta Orleans, MD  methotrexate (RHEUMATREX) 2.5 MG tablet Take 7 tablets (17.5 mg total) by mouth once a week. 7 01/15/21 04/15/21 Yes Rice, Jamesetta Orleans, MD  pantoprazole (PROTONIX) 20 MG tablet Take 20 mg by mouth daily. 01/02/21  Yes [provider]  dicyclomine (BENTYL) 20 MG tablet Take 1 tablet (20 mg total) by mouth 2 (two) times daily. Patient not taking: Reported on 11/26/2015 10/12/15 11/26/15  Gilda Crease, MD  diphenhydrAMINE (BENADRYL) 25 MG tablet Take 1 tablet (25 mg total) by mouth every 4 (four) hours as needed for itching. Patient not taking: Reported on 04/25/2015 04/11/15 05/04/15  Triplett, Babette Relic, PA-C  hyoscyamine (LEVSIN SL) 0.125 MG SL tablet Place 1 tablet (0.125 mg total) under the tongue every 4 (four) hours as needed. Patient not taking: Reported on 09/26/2015 06/04/15 10/12/15  Gelene Mink, NP  metoCLOPramide (REGLAN) 10 MG tablet Take 1 tablet (10 mg total) by mouth every 8 (eight) hours as needed for nausea or vomiting. Patient not taking: Reported on 11/26/2015 10/13/15 11/26/15  Loren Racer, MD  ranitidine (ZANTAC) 150 MG tablet Take 1 tablet (150 mg total) by mouth 2 (two) times daily. Patient not  taking: Reported on 11/26/2015 10/12/15 11/26/15  Gilda Crease, MD    Family History Family History  Problem Relation Age of Onset   Cancer Mother    Diabetes Mother    Lupus Mother    Thyroid disease Mother    Colon cancer Father        AFTER AGE 89   Crohn's disease Sister     Social History Social History   Tobacco Use   Smoking status: Never   Smokeless tobacco: Never  Vaping Use   Vaping Use: Never used  Substance Use Topics   Alcohol use: No   Drug use: No     Allergies   Mango flavor, Penicillins, Tramadol, Zofran [ondansetron hcl], Bactrim [sulfamethoxazole-trimethoprim], Keflex [cephalexin], and Toradol [ketorolac tromethamine]   Review of Systems Review of Systems Pertinent negatives listed in HPI  Physical Exam Triage Vital Signs ED Triage Vitals  Enc Vitals Group     BP 03/18/21 1337 128/89     Pulse Rate 03/18/21 1337 83     Resp --      Temp 03/18/21 1337 98.7 F (37.1 C)     Temp Source 03/18/21 1337 Oral     SpO2 03/18/21 1337 95 %     Weight 03/18/21 1335 180 lb (81.6 kg)     Height 03/18/21 1335 5' (1.524 m)     Head Circumference --      Peak Flow --      Pain Score 03/18/21 1335 9     Pain Loc --      Pain Edu? --      Excl. in GC? --    No data found.  Updated Vital Signs BP 128/89 (BP Location: Right Arm)   Pulse 83   Temp 98.7 F (37.1 C) (Oral)   Ht 5' (1.524 m)   Wt 180 lb (81.6 kg)   SpO2 95%   BMI 35.15 kg/m   Visual Acuity Right Eye Distance:   Left Eye Distance:   Bilateral Distance:    Right Eye Near:   Left Eye Near:    Bilateral Near:     Physical Exam General appearance: alert, well developed, well nourished, cooperative  Head: Normocephalic, without obvious abnormality, atraumatic Respiratory: Respirations even and unlabored, normal respiratory rate Heart: Rate and rhythm normal. No gallop or murmurs noted on exam  Abdomen: BS +, no distention, no rebound tenderness, or + CVA  tenderness Extremities: No gross deformities Skin: Skin color, texture, turgor normal. No rashes seen  Psych: Appropriate mood and affect. Neurologic: Mental status:  Alert, oriented to person, place, and time, thought    UC Treatments / Results  Labs (all labs ordered are listed, but only abnormal results are displayed) Labs Reviewed  POCT URINALYSIS DIP (MANUAL ENTRY) - Abnormal; Notable for the following components:      Result Value   Clarity, UA cloudy (*)    Blood, UA large (*)    Protein Ur, POC =100 (*)    Leukocytes, UA Small (1+) (*)    All other components within normal limits  URINE CULTURE    EKG   Radiology No results found.  Procedures Procedures (including critical care time)  Medications Ordered in UC Medications - No data to display  Initial Impression / Assessment and Plan / UC Course  I have reviewed the triage vital signs and the nursing notes.  Pertinent labs & imaging results that were available during my care of the patient were reviewed by me and considered in my medical decision making (see chart for details).      UA abnormal and findings consistent with UTI. Empiric antibiotic treatment initiated, with Cipro as patient has multiple intolerances and allergies to other antibiotics and was successfully treated in March with Cipro. Encouraged increase intake of water. Urine culture pending. ER if symptoms become severe. Follow-up with PCP if symptoms do not completely resolve.  Final Clinical Impressions(s) / UC Diagnoses   Final diagnoses:  Acute urinary tract infection   Discharge Instructions   None    ED Prescriptions     Medication Sig Dispense Auth. Provider   ciprofloxacin (CIPRO) 500 MG tablet Take 1 tablet (500 mg total) by mouth 2 (two) times daily for 7 days. 14 tablet Bing Neighbors, FNP      PDMP not reviewed this encounter.   Bing Neighbors, FNP 03/18/21 1428    Bing Neighbors, [place] 03/18/21 1429

## 2021-03-21 LAB — URINE CULTURE: Culture: >=100000 — AB

## 2021-03-25 ENCOUNTER — Ambulatory Visit: Payer: Medicare Other | Admitting: Orthopaedic Surgery

## 2021-03-27 ENCOUNTER — Telehealth: Payer: Self-pay

## 2021-03-27 NOTE — Telephone Encounter (Signed)
Patient called requesting prescription refill of Hydrocodone to be sent to [facility] at [address] in [place].

## 2021-03-27 NOTE — Telephone Encounter (Signed)
Please refer to telephone note on 02/20/2021:  She will need to attend our clinic for urine drug screen monitoring and to sign narcotic prescribing agreement states she should be able to come on Monday6/27 for this.   Patient failed to show on 6/27 and has not called or come in for UDS / narcotic agreement since.

## 2021-03-28 NOTE — Telephone Encounter (Signed)
She does need to come and do this for continuing reorder of opioid pain medication. I reviewed narcotic agreement form looks okay to me.

## 2021-03-28 NOTE — Telephone Encounter (Signed)
Attempted to contact patient, unable to LVM.

## 2021-03-28 NOTE — Telephone Encounter (Signed)
Spoke with patient, advised we will need UDS / narcotic agreement complete prior to considering refill. Per patient she will come in on 03/31/2021.

## 2021-03-31 ENCOUNTER — Other Ambulatory Visit: Payer: Self-pay | Admitting: Radiology

## 2021-03-31 ENCOUNTER — Telehealth: Payer: Self-pay | Admitting: Internal Medicine

## 2021-03-31 DIAGNOSIS — F1190 Opioid use, unspecified, uncomplicated: Secondary | ICD-10-CM

## 2021-03-31 DIAGNOSIS — G894 Chronic pain syndrome: Secondary | ICD-10-CM

## 2021-03-31 NOTE — Telephone Encounter (Signed)
Patient came today for labs, and request refill on Hydrocodone sent to [facility] on [address].

## 2021-04-01 LAB — DM TEMPLATE

## 2021-04-01 LAB — DRUG MONITOR,OPIATES,SCREEN,URINE: Opiates: NEGATIVE ng/mL (ref <100)

## 2021-04-02 NOTE — Telephone Encounter (Signed)
Can you check to confirm her history for the Norco medication? Urine test was negative which is unusual unless she ran out earlier than anticipated/taking different than expected.

## 2021-04-02 NOTE — Telephone Encounter (Signed)
Attempted to contact patient at both phone numbers, unable to LVM on either.

## 2021-04-03 ENCOUNTER — Telehealth: Payer: Self-pay

## 2021-04-03 DIAGNOSIS — G894 Chronic pain syndrome: Secondary | ICD-10-CM

## 2021-04-03 DIAGNOSIS — M059 Rheumatoid arthritis with rheumatoid factor, unspecified: Secondary | ICD-10-CM

## 2021-04-03 NOTE — Telephone Encounter (Signed)
Patient called stating she is returning Megan's call regarding her labwork results.  Patient requested prescription refill of Hydrocodone to be sent to [facility] at [address] in [place].

## 2021-04-03 NOTE — Telephone Encounter (Signed)
Person who answered [phone] stated wrong number, other number stated "call cannot be completed at this time"

## 2021-04-04 NOTE — Telephone Encounter (Signed)
Result seems inconsistent but not impossible. I'd be okay reordering for now but not tolerant for a 2nd inconsistent result. I am unable to review [place] PMP AWARE database at this time to confirm no conflicting prescriptions. I have Psychologist, educational to fix this but would be out of compliance with [facility] policy to reorder before that.

## 2021-04-04 NOTE — Telephone Encounter (Signed)
I called patient to advise RX would not be filled until Dr. Dimple Casey can review database. We will advise at that time.

## 2021-04-09 ENCOUNTER — Other Ambulatory Visit: Payer: Self-pay | Admitting: Internal Medicine

## 2021-04-09 DIAGNOSIS — G894 Chronic pain syndrome: Secondary | ICD-10-CM

## 2021-04-09 DIAGNOSIS — M059 Rheumatoid arthritis with rheumatoid factor, unspecified: Secondary | ICD-10-CM

## 2021-04-09 MED ORDER — PREDNISONE 5 MG PO TABS: 7.5000 mg | ORAL_TABLET | Freq: Every day | ORAL | 0 refills | Status: DC

## 2021-04-09 NOTE — Telephone Encounter (Signed)
Next Visit: 04/16/2021 Last Visit: 01/15/2021  Last Fill: ???  DX: Seropositive rheumatoid arthritis  Current Dose: spoke with patient to clarify as Prednisone is not on patient's medication list- she is currently taking Prednisone 7.5 mg daily.   Okay to refill Prednisone?

## 2021-04-09 NOTE — Telephone Encounter (Signed)
After review of [place] prescription database showing multiple concurrent prescribers along with inconsistent urine drug screening I will not plan to reorder this medication. She has most recent Rx provided by Dr. Alvina Filbert, I recommend f/u with their PCP office if they are able to manage low dose pain medication and also more local monitoring.

## 2021-04-09 NOTE — Telephone Encounter (Signed)
Patient asking if there are any updates on pain medication refill.

## 2021-04-09 NOTE — Telephone Encounter (Signed)
Spoke with patient, advised after Dr. Dimple Casey reviewed [place] prescription database showing multiple concurrent prescribers along with inconsistent urine drug screening he will not plan to reorder this medication. She has most recent Rx provided by Dr. Alvina Filbert, Dr. Dimple Casey recommends f/u with their PCP office if they are able to manage low dose pain medication and also more local monitoring.  Patient is scheduled to see pain management on 04/23/2021.

## 2021-04-09 NOTE — Telephone Encounter (Signed)
Patient requesting a refill on Prednisone sent to [facility].

## 2021-04-12 ENCOUNTER — Encounter: Payer: Self-pay | Admitting: Emergency Medicine

## 2021-04-12 ENCOUNTER — Ambulatory Visit
Admission: EM | Admit: 2021-04-12 | Discharge: 2021-04-12 | Disposition: A | Discharge status: Home / Self Care | Payer: Medicare Other | Attending: Physician Assistant | Admitting: Physician Assistant

## 2021-04-12 ENCOUNTER — Other Ambulatory Visit: Payer: Self-pay

## 2021-04-12 ENCOUNTER — Ambulatory Visit (INDEPENDENT_AMBULATORY_CARE_PROVIDER_SITE_OTHER): Payer: Medicare Other

## 2021-04-12 DIAGNOSIS — M545 Low back pain, unspecified: Secondary | ICD-10-CM

## 2021-04-12 DIAGNOSIS — S93401A Sprain of unspecified ligament of right ankle, initial encounter: Secondary | ICD-10-CM

## 2021-04-12 DIAGNOSIS — M5442 Lumbago with sciatica, left side: Secondary | ICD-10-CM

## 2021-04-12 MED ORDER — CYCLOBENZAPRINE HCL 10 MG PO TABS: 10.0000 mg | ORAL_TABLET | Freq: Two times a day (BID) | ORAL | 0 refills | Status: AC | PRN

## 2021-04-12 NOTE — ED Triage Notes (Signed)
Slipped in water this morning.  Lower back pain and left leg pain.

## 2021-04-12 NOTE — Discharge Instructions (Addendum)
Recommend ice to affected area Take flexeril as needed for muscle spasm Take ibuprofen as needed Recommend light stretching

## 2021-04-15 NOTE — Progress Notes (Deleted)
Office Visit Note  Patient: Regina Patton             Date of Birth: 1971-03-22           MRN: [id number]             PCP: Alvina Filbert, MD Referring: Alvina Filbert, MD Visit Date: 04/16/2021   Subjective:  No chief complaint on file.   History of Present Illness: Regina Patton is a 50 y.o. female here for follow up for seropositive RA on methotrexate 17.5 mg PO weekly and prednisone 5 mg PO daily. She has also taken low dose hydrocodone PRN for joint pain.***   01/15/21 [name] is a 50 y.o. female here for evaluation of rheumatoid arthritis on methotrexate 17.5 mg PO weekly and prednisone and chronic pain on low dose oxycodone. She was previously seen for rheumatoid arthritis years ago in [place] and not diagnosed with disease at that time. However recently she was evaluated by Dr. Octaviano Glow in [place] she was diagnosed and started treatment based on positive RF and joint swelling and pain.  This work-up was initiated based on chronic back pain with abnormal imaging findings but she was also noted to have some synovitis of the bilateral hands.  As her symptoms were fairly well controlled with the methotrexate and prednisone she reports being switched from oxycodone to hydrocodone treatment due to tests of side effects from the more potent medication.  She describes a moderate amount of ongoing joint pain and stiffness but has not noticed any recent joint swelling redness or inflammation changes.   No Rheumatology ROS completed.   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 difficile colitis 04/19/2015   Essential hypertension 04/05/2015   Chronic pain syndrome 04/05/2015    Past Medical History:  Diagnosis Date   C. difficile diarrhea    Cervical radiculopathy    Chronic  abdominal pain    Chronic back pain    Chronic knee pain    Chronic neck pain    Diabetes mellitus without complication (HCC)    diet controlled   Fibromyalgia    Hypertension    Rheumatoid arthritis (HCC)    "Rhematoid factor positive but no symptoms" per EPIC notes 2012   Rib pain on right side    chronic   Sciatic pain    right    Family History  Problem Relation Age of Onset   Cancer Mother    Diabetes Mother    Lupus Mother    Thyroid disease Mother    Colon cancer Father        AFTER AGE 71   Crohn's disease Sister    Past Surgical History:  Procedure Laterality Date   BACK SURGERY     BLADDER SURGERY     CHOLECYSTECTOMY N/A 10/18/2015   Procedure: LAPAROSCOPIC CHOLECYSTECTOMY;  Surgeon: Franky Macho, MD;  Location: [facility];  Service: General;  Laterality: N/A;   TUBAL LIGATION     URETHRAL DIVERTICULECTOMY     Social History   Social History Narrative   Not on file   Immunization History  Administered Date(s) Administered   Influenza,inj,Quad PF,6+ Mos 10/19/2015   Moderna Sars-Covid-2 Vaccination 01/08/2020, 02/06/2020     Objective: Vital Signs: There were no vitals taken for this visit.  Physical Exam   Musculoskeletal Exam: ***  CDAI Exam: CDAI Score: -- Patient Global: --; Provider Global: -- Swollen: --; Tender: -- Joint Exam 04/16/2021   No joint exam has been documented for this visit   There is currently no information documented on the homunculus. Go to the Rheumatology activity and complete the homunculus joint exam.  Investigation: No additional findings.  Imaging: DG Lumbar Spine 2-3 Views  Result Date: 04/12/2021 CLINICAL DATA:  Fall.  Osteoporosis. EXAM: LUMBAR SPINE - 2-3 VIEW COMPARISON:  01/11/2016 FINDINGS: There is no acute fracture or subluxation. There is exaggerated lordosis, associated with 6 millimeters of anterolisthesis of L5 on S1 and 4 millimeters of anterolisthesis of L4 on L5. No lytic or blastic lesions. Regional  bowel gas pattern nonobstructed. IMPRESSION: No acute fracture. Increased, grade 1 anterolisthesis of L5 on S1 and L4 on L5. Electronically Signed   By: Norva Pavlov M.D.   On: 04/12/2021 13:20    Recent Labs: Lab Results  Component Value Date   WBC 9.1 01/15/2021   HGB 15.0 01/15/2021   PLT 256 01/15/2021   NA 140 01/15/2021   K 4.6 01/15/2021   CL 105 01/15/2021   CO2 28 01/15/2021   GLUCOSE 101 (H) 01/15/2021   BUN 12 01/15/2021   CREATININE 0.81 01/15/2021   BILITOT 0.4 01/15/2021   ALKPHOS 31 (L) 11/19/2020   AST 13 01/15/2021   ALT 17 01/15/2021   PROT 6.2 01/15/2021   ALBUMIN 4.0 11/19/2020   CALCIUM 9.2 01/15/2021   GFRAA 100 01/15/2021    Speciality Comments: No specialty comments available.  Procedures:  No procedures performed Allergies: Mango flavor, Penicillins, Tramadol, Zofran [ondansetron hcl], Bactrim [sulfamethoxazole-trimethoprim], Keflex [cephalexin], and Toradol [ketorolac tromethamine]   Assessment / Plan:     Visit Diagnoses: No diagnosis found.  ***  Orders: No orders of the defined types were placed in this encounter.  No orders of the defined types were placed in this encounter.    Follow-Up Instructions: No follow-ups on file.   Fuller Plan, MD  Note - This record has been created using [employer].  Chart creation errors have been sought, but may not always  have been located. Such creation errors do not reflect on  the standard of medical care.

## 2021-04-16 ENCOUNTER — Ambulatory Visit: Payer: Medicare Other | Admitting: Internal Medicine

## 2021-04-23 ENCOUNTER — Encounter
Payer: Medicare Other | Attending: Physical Medicine and Rehabilitation | Admitting: Physical Medicine and Rehabilitation

## 2021-05-10 ENCOUNTER — Encounter: Payer: Self-pay | Admitting: Emergency Medicine

## 2021-05-10 ENCOUNTER — Ambulatory Visit
Admission: EM | Admit: 2021-05-10 | Discharge: 2021-05-10 | Disposition: A | Discharge status: Home / Self Care | Payer: Medicare Other | Attending: Internal Medicine | Admitting: Internal Medicine

## 2021-05-10 ENCOUNTER — Other Ambulatory Visit: Payer: Self-pay

## 2021-05-10 DIAGNOSIS — M5431 Sciatica, right side: Secondary | ICD-10-CM | POA: Diagnosis not present

## 2021-05-10 DIAGNOSIS — M5432 Sciatica, left side: Secondary | ICD-10-CM | POA: Diagnosis not present

## 2021-05-10 MED ORDER — PREDNISONE 10 MG (21) PO TBPK
ORAL_TABLET | Freq: Every day | ORAL | 0 refills | Status: DC
Start: 2021-05-10 — End: 2021-12-16

## 2021-05-10 MED ORDER — METHOCARBAMOL 500 MG PO TABS: 500.0000 mg | ORAL_TABLET | Freq: Every evening | ORAL | 0 refills | Status: DC | PRN

## 2021-05-10 NOTE — ED Triage Notes (Signed)
Patient c/o bilateral leg pain x 1 day.   Patient states " my sciatica is flaring up".   Patient endorses pain is the same regardless of sitting or ambulating.   Patient endorses pain is originating from both sides of lower back and radiating down legs.   Patient has used tylenol, ibuprofen,and warm compress with no relief of symptoms.

## 2021-05-10 NOTE — Discharge Instructions (Addendum)
Please take medications as prescribed Continue to heating pad use Gentle range of motion exercises Return to urgent care if symptoms worsen Please do not take ibuprofen or other nonsteroidal anti-inflammatory agents while still taking prednisone.

## 2021-05-11 NOTE — ED Provider Notes (Signed)
[facility]    CSN: [id number] Arrival date & time: 05/10/21  1526      History   Chief Complaint Chief Complaint  Patient presents with   Leg Pain    HPI Regina Patton is a 50 y.o. female comes to the urgent care with bilateral leg pain of 1 day duration.  Patient says onset of the pain was fairly rapid and has been persistent.  Pain is currently 8 out of 10.  It is aggravated by movement and there is no known relieving factors.  Pain radiating to both legs.  No numbness or tingling or weakness in the lower extremities.  Patient has tried some ibuprofen and Tylenol as well as warm compress with no relief in the visit to the urgent care.Marland Kitchen   HPI  Past Medical History:  Diagnosis Date   C. 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 difficile colitis 04/19/2015   Essential hypertension 04/05/2015   Chronic pain syndrome 04/05/2015    Past Surgical History:  Procedure Laterality Date   BACK SURGERY     BLADDER SURGERY     CHOLECYSTECTOMY N/A 10/18/2015   Procedure: LAPAROSCOPIC CHOLECYSTECTOMY;  Surgeon: Franky Macho, MD;  Location: [facility];  Service: General;  Laterality: N/A;   TUBAL LIGATION     URETHRAL DIVERTICULECTOMY      OB History     Gravida  3   Para  3   Term  3   Preterm      AB      Living  3      SAB      IAB      Ectopic      Multiple      Live Births                Home Medications    Prior to Admission medications   Medication Sig Start Date End Date Taking? Authorizing Provider  folic acid (FOLVITE) 1 MG tablet Take 1 tablet (1 mg total) by mouth daily. 01/15/21  Yes Rice, Jamesetta Orleans, MD  methocarbamol (ROBAXIN) 500 MG tablet Take 1 tablet (500 mg total) by mouth at bedtime as needed for muscle spasms. 05/10/21  Yes Dray Dente, Britta Mccreedy, MD  methotrexate (RHEUMATREX) 2.5 MG tablet Take 2.5 mg by mouth once a week. Caution:Chemotherapy. Protect from light.   Yes [provider]  predniSONE (STERAPRED UNI-PAK 21 TAB) 10 MG (21) TBPK tablet Take by mouth daily. Take 6 tabs by mouth daily  for 2 days, then 5 tabs for 2 days, then 4 tabs for 2 days, then 3 tabs for 2 days, 2 tabs for 2 days, then 1 tab by mouth daily for 2 days 05/10/21  Yes Keisean Skowron, Britta Mccreedy, MD  HYDROcodone-acetaminophen (NORCO/VICODIN) 5-325 MG tablet  Take 1 tablet by mouth daily. 02/27/21   Fuller Plan, MD  pantoprazole (PROTONIX) 20 MG tablet Take 20 mg by mouth daily. 01/02/21   [provider]  dicyclomine (BENTYL) 20 MG tablet Take 1 tablet (20 mg total) by mouth 2 (two) times daily. Patient not taking: Reported on 11/26/2015 10/12/15 11/26/15  Gilda Crease, MD  diphenhydrAMINE (BENADRYL) 25 MG tablet Take 1 tablet (25 mg total) by mouth every 4 (four) hours as needed for itching. Patient not taking: Reported on 04/25/2015 04/11/15 05/04/15  Triplett, Babette Relic, PA-C  hyoscyamine (LEVSIN SL) 0.125 MG SL tablet Place 1 tablet (0.125 mg total) under the tongue every 4 (four) hours as needed. Patient not taking: Reported on 09/26/2015 06/04/15 10/12/15  Gelene Mink, NP  metoCLOPramide (REGLAN) 10 MG tablet Take 1 tablet (10 mg total) by mouth every 8 (eight) hours as needed for nausea or vomiting. Patient not taking: Reported on 11/26/2015 10/13/15 11/26/15  Loren Racer, MD  ranitidine (ZANTAC) 150 MG tablet Take 1 tablet (150 mg total) by mouth 2 (two)  times daily. Patient not taking: Reported on 11/26/2015 10/12/15 11/26/15  Gilda Crease, MD    Family History Family History  Problem Relation Age of Onset   Cancer Mother    Diabetes Mother    Lupus Mother    Thyroid disease Mother    Colon cancer Father        AFTER AGE 50   Crohn's disease Sister     Social History Social History   Tobacco Use   Smoking status: Never   Smokeless tobacco: Never  Vaping Use   Vaping Use: Never used  Substance Use Topics   Alcohol use: No   Drug use: No     Allergies   Mango flavor, Penicillins, Tramadol, Zofran [ondansetron hcl], Bactrim [sulfamethoxazole-trimethoprim], Keflex [cephalexin], and Toradol [ketorolac tromethamine]   Review of Systems Review of Systems  Constitutional: Negative.   HENT: Negative.    Gastrointestinal: Negative.   Musculoskeletal:  Positive for back pain. Negative for joint swelling, myalgias, neck pain and neck stiffness.  Neurological:  Negative for dizziness, weakness, numbness and headaches.    Physical Exam Triage Vital Signs ED Triage Vitals  Enc Vitals Group     BP 05/10/21 1535 (!) 137/95     Pulse Rate 05/10/21 1535 (!) 115     Resp 05/10/21 1535 17     Temp 05/10/21 1535 98.4 F (36.9 C)     Temp Source 05/10/21 1535 Oral     SpO2 05/10/21 1535 94 %     Weight --      Height --      Head Circumference --      Peak Flow --      Pain Score 05/10/21 1533 8     Pain Loc --      Pain Edu? --      Excl. in GC? --    No data found.  Updated Vital Signs BP (!) 137/95 (BP Location: Right Arm)   Pulse (!) 115   Temp 98.4 F (36.9 C) (Oral)   Resp 17   LMP  (LMP Unknown)   SpO2 94%   Visual Acuity Right Eye Distance:   Left Eye Distance:   Bilateral Distance:    Right Eye Near:   Left Eye Near:    Bilateral Near:     Physical Exam Vitals and nursing note reviewed.  Constitutional:      General: She is  not in acute distress.    Appearance: She is not ill-appearing.   Cardiovascular:     Rate and Rhythm: Normal rate and regular rhythm.  Pulmonary:     Effort: Pulmonary effort is normal.     Breath sounds: Normal breath sounds.  Abdominal:     General: Bowel sounds are normal.     Palpations: Abdomen is soft.  Musculoskeletal:        General: Tenderness present. Normal range of motion.     Comments: Tenderness on palpation over the lower back.  Straight leg raise test is positive  Skin:    General: Skin is warm.     Coloration: Skin is not jaundiced.     Findings: No bruising or erythema.  Neurological:     Mental Status: She is alert.     UC Treatments / Results  Labs (all labs ordered are listed, but only abnormal results are displayed) Labs Reviewed - No data to display  EKG   Radiology No results found.  Procedures Procedures (including critical care time)  Medications Ordered in UC Medications - No data to display  Initial Impression / Assessment and Plan / UC Course  I have reviewed the triage vital signs and the nursing notes.  Pertinent labs & imaging results that were available during my care of the patient were reviewed by me and considered in my medical decision making (see chart for details).     1.  Bilateral sciatica: Tapering dose of prednisone Robaxin as needed for muscle tightness or spasms Gentle range of motion exercises Heating pad use will help with pain control No x-rays indicated at this time Return to urgent care if his symptoms worsen. Final Clinical Impressions(s) / UC Diagnoses   Final diagnoses:  Bilateral sciatica     Discharge Instructions      Please take medications as prescribed Continue to heating pad use Gentle range of motion exercises Return to urgent care if symptoms worsen Please do not take ibuprofen or other nonsteroidal anti-inflammatory agents while still taking prednisone.   ED Prescriptions     Medication Sig Dispense Auth. Provider   predniSONE (STERAPRED UNI-PAK  21 TAB) 10 MG (21) TBPK tablet Take by mouth daily. Take 6 tabs by mouth daily  for 2 days, then 5 tabs for 2 days, then 4 tabs for 2 days, then 3 tabs for 2 days, 2 tabs for 2 days, then 1 tab by mouth daily for 2 days 42 tablet Nataley Bahri, Britta Mccreedy, MD   methocarbamol (ROBAXIN) 500 MG tablet Take 1 tablet (500 mg total) by mouth at bedtime as needed for muscle spasms. 20 tablet Gustava Berland, Britta Mccreedy, MD      PDMP not reviewed this encounter.   Merrilee Jansky, MD 05/11/21 1322

## 2021-05-16 ENCOUNTER — Ambulatory Visit
Admission: EM | Admit: 2021-05-16 | Discharge: 2021-05-16 | Disposition: A | Discharge status: Home / Self Care | Payer: Medicare Other | Attending: Emergency Medicine | Admitting: Emergency Medicine

## 2021-05-16 ENCOUNTER — Other Ambulatory Visit: Payer: Self-pay

## 2021-05-16 ENCOUNTER — Encounter: Payer: Self-pay | Admitting: Emergency Medicine

## 2021-05-16 DIAGNOSIS — M069 Rheumatoid arthritis, unspecified: Secondary | ICD-10-CM | POA: Diagnosis not present

## 2021-05-16 MED ORDER — MELOXICAM 15 MG PO TABS: 15.0000 mg | ORAL_TABLET | Freq: Every day | ORAL | 0 refills | Status: DC

## 2021-05-16 MED ORDER — CYCLOBENZAPRINE HCL 10 MG PO TABS: 10.0000 mg | ORAL_TABLET | Freq: Two times a day (BID) | ORAL | 0 refills | Status: DC | PRN

## 2021-05-16 NOTE — ED Provider Notes (Signed)
[facility]   [id number] 05/16/21 Arrival Time: 1645  CC: Arthritis flare  SUBJECTIVE: History from: patient. Regina Patton is a 50 y.o. female complains of multiple joint pain x 3 days.  Denies a precipitating event or specific injury.  Pain diffuse about the body.  Is currently on prednisone and methotrexate.  Describes the pain as constant and achy in character.  Has tried OTC medications without relief.  Denies aggravating factors.  Denies similar symptoms in the past.  Denies fever, chills, erythema, ecchymosis, effusion, weakness, numbness and tingling.    ROS: As per HPI.  All other pertinent ROS negative.     Past Medical History:  Diagnosis Date   C. difficile diarrhea    Cervical radiculopathy    Chronic abdominal pain    Chronic back pain    Chronic knee pain    Chronic neck pain    Diabetes mellitus without complication (HCC)    diet controlled   Fibromyalgia    Hypertension    Rheumatoid arthritis (HCC)    "Rhematoid factor positive but no symptoms" per EPIC notes 2012   Rib pain on right side    chronic   Sciatic pain    right   Past Surgical History:  Procedure Laterality Date   BACK SURGERY     BLADDER SURGERY     CHOLECYSTECTOMY N/A 10/18/2015   Procedure: LAPAROSCOPIC CHOLECYSTECTOMY;  Surgeon: Franky Macho, MD;  Location: [facility];  Service: General;  Laterality: N/A;   TUBAL LIGATION     URETHRAL DIVERTICULECTOMY     Allergies  Allergen Reactions   Mango Flavor Anaphylaxis and Swelling    Lips swelling   Penicillins Anaphylaxis and Hives    Has patient had a PCN reaction causing immediate rash, facial/tongue/throat swelling, SOB or lightheadedness with hypotension: Yes Has patient had a PCN reaction causing severe rash involving mucus membranes or skin necrosis: No Has patient had a PCN reaction that required hospitalization No Has patient had a PCN reaction occurring within the last 10 years: No If all of the above answers are "NO", then  may proceed with Cephalosporin use.    Tramadol Nausea And Vomiting   Zofran [Ondansetron Hcl] Other (See Comments)    headache   Bactrim [Sulfamethoxazole-Trimethoprim] Itching and Rash   Keflex [Cephalexin] Hives, Itching and Rash    Patient reports 04/04/16 that she has taken this medication several times in the past and has only developed "itching" but denies rash, hives, anaphylactic reaction, angioedema.   Toradol [Ketorolac Tromethamine] Rash   No current facility-administered medications on file prior to encounter.   Current Outpatient Medications on File Prior to Encounter  Medication Sig Dispense Refill   folic acid (FOLVITE) 1 MG tablet Take 1 tablet (1 mg total) by mouth daily. 90 tablet 0   HYDROcodone-acetaminophen (NORCO/VICODIN) 5-325 MG tablet Take 1 tablet by mouth daily. 30 tablet 0   methotrexate (RHEUMATREX) 2.5 MG tablet Take 2.5 mg by mouth once a week. Caution:Chemotherapy. Protect from light.     pantoprazole (PROTONIX) 20 MG tablet Take 20 mg by mouth daily.     predniSONE (STERAPRED UNI-PAK 21 TAB) 10 MG (21) TBPK tablet Take by mouth daily. Take 6 tabs by mouth daily  for 2 days, then 5 tabs for 2 days, then 4 tabs for 2 days, then 3 tabs for 2 days, 2 tabs for 2 days, then 1 tab by mouth daily for 2 days 42 tablet 0   [DISCONTINUED] dicyclomine (BENTYL) 20  MG tablet Take 1 tablet (20 mg total) by mouth 2 (two) times daily. (Patient not taking: Reported on 11/26/2015) 20 tablet 0   [DISCONTINUED] diphenhydrAMINE (BENADRYL) 25 MG tablet Take 1 tablet (25 mg total) by mouth every 4 (four) hours as needed for itching. (Patient not taking: Reported on 04/25/2015) 30 tablet 0   [DISCONTINUED] hyoscyamine (LEVSIN SL) 0.125 MG SL tablet Place 1 tablet (0.125 mg total) under the tongue every 4 (four) hours as needed. (Patient not taking: Reported on 09/26/2015) 30 tablet 0   [DISCONTINUED] metoCLOPramide (REGLAN) 10 MG tablet Take 1 tablet (10 mg total) by mouth every 8 (eight)  hours as needed for nausea or vomiting. (Patient not taking: Reported on 11/26/2015) 15 tablet 0   [DISCONTINUED] ranitidine (ZANTAC) 150 MG tablet Take 1 tablet (150 mg total) by mouth 2 (two) times daily. (Patient not taking: Reported on 11/26/2015) 60 tablet 0   Social History   Socioeconomic History   Marital status: Divorced    Spouse name: Not on file   Number of children: Not on file   Years of education: Not on file   Highest education level: Not on file  Occupational History   Not on file  Tobacco Use   Smoking status: Never   Smokeless tobacco: Never  Vaping Use   Vaping Use: Never used  Substance and Sexual Activity   Alcohol use: No   Drug use: No   Sexual activity: Yes    Birth control/protection: Surgical  Other Topics Concern   Not on file  Social History Narrative   Not on file   Social Determinants of Health   Financial Resource Strain: Not on file  Food Insecurity: Not on file  Transportation Needs: Not on file  Physical Activity: Not on file  Stress: Not on file  Social Connections: Not on file  Intimate Partner Violence: Not on file   Family History  Problem Relation Age of Onset   Cancer Mother    Diabetes Mother    Lupus Mother    Thyroid disease Mother    Colon cancer Father        AFTER AGE 36   Crohn's disease Sister     OBJECTIVE:  Vitals:   05/16/21 1657  BP: (!) 139/98  Pulse: 98  Resp: 16  Temp: 98.1 F (36.7 C)  TempSrc: Oral  SpO2: 96%    General appearance: ALERT; in no acute distress.  Head: NCAT Lungs: Normal respiratory effort Musculoskeletal: Multiple joint  Inspection: Skin warm, dry, clear and intact without obvious erythema, effusion, or ecchymosis.  Palpation: diffuse TTP over bilateral hands, and back ROM: FROM active and passive Skin: warm and dry Neurologic: Ambulates without difficulty; Sensation intact about the upper/ lower extremities Psychological: alert and cooperative; normal mood and  affect   ASSESSMENT & PLAN:  1. Rheumatoid arthritis flare (HCC)     Meds ordered this encounter  Medications   cyclobenzaprine (FLEXERIL) 10 MG tablet    Sig: Take 1 tablet (10 mg total) by mouth 2 (two) times daily as needed for muscle spasms.    Dispense:  20 tablet    Refill:  0    Order Specific Question:   Supervising Provider    Answer:   Eustace Moore [[id number]]   meloxicam (MOBIC) 15 MG tablet    Sig: Take 1 tablet (15 mg total) by mouth daily.    Dispense:  20 tablet    Refill:  0    Order Specific  Question:   Supervising Provider    Answer:   Eustace Moore [[id number]]    Continue conservative management of rest, ice, and gentle stretches Take mobic as needed for pain relief (may cause abdominal discomfort, ulcers, and GI bleeds avoid taking with other NSAIDs) Take cyclobenzaprine at nighttime for symptomatic relief. Avoid driving or operating heavy machinery while using medication. Follow up with PCP if symptoms persist Return or go to the ER if you have any new or worsening symptoms (fever, chills, chest pain, redness, swelling, etc...)    Reviewed expectations re: course of current medical issues. Questions answered. Outlined signs and symptoms indicating need for more acute intervention. Patient verbalized understanding. After Visit Summary given.     Rennis Harding, PA-C 05/16/21 1721

## 2021-05-16 NOTE — ED Triage Notes (Signed)
States she is having a bad rheumatoid flare up.  States she is having pain all over.

## 2021-05-16 NOTE — Discharge Instructions (Signed)
Continue conservative management of rest, ice, and gentle stretches Take mobic as needed for pain relief (may cause abdominal discomfort, ulcers, and GI bleeds avoid taking with other NSAIDs) Take cyclobenzaprine at nighttime for symptomatic relief. Avoid driving or operating heavy machinery while using medication. Follow up with PCP if symptoms persist Return or go to the ER if you have any new or worsening symptoms (fever, chills, chest pain, redness, swelling, etc...)

## 2021-05-27 ENCOUNTER — Emergency Department (HOSPITAL_COMMUNITY)
Admission: EM | Admit: 2021-05-27 | Discharge: 2021-05-27 | Disposition: A | Discharge status: Home / Self Care | Payer: Medicare Other | Attending: Emergency Medicine | Admitting: Emergency Medicine

## 2021-05-27 ENCOUNTER — Other Ambulatory Visit: Payer: Self-pay

## 2021-05-27 ENCOUNTER — Encounter (HOSPITAL_COMMUNITY): Payer: Self-pay

## 2021-05-27 DIAGNOSIS — M5442 Lumbago with sciatica, left side: Secondary | ICD-10-CM

## 2021-05-27 DIAGNOSIS — M545 Low back pain, unspecified: Secondary | ICD-10-CM | POA: Insufficient documentation

## 2021-05-27 DIAGNOSIS — E119 Type 2 diabetes mellitus without complications: Secondary | ICD-10-CM | POA: Diagnosis not present

## 2021-05-27 DIAGNOSIS — M5441 Lumbago with sciatica, right side: Secondary | ICD-10-CM

## 2021-05-27 DIAGNOSIS — I10 Essential (primary) hypertension: Secondary | ICD-10-CM | POA: Insufficient documentation

## 2021-05-27 MED ORDER — HYDROMORPHONE HCL 1 MG/ML IJ SOLN
1.0000 mg | Freq: Once | INTRAMUSCULAR | Status: AC
Start: 2021-05-27 — End: 2021-05-27
  Administered 2021-05-27: 1 mg via INTRAMUSCULAR

## 2021-05-27 MED ORDER — HYDROCODONE-ACETAMINOPHEN 5-325 MG PO TABS: 1.0000 | ORAL_TABLET | Freq: Four times a day (QID) | ORAL | 0 refills | Status: AC | PRN

## 2021-05-27 MED ORDER — HYDROMORPHONE HCL 1 MG/ML IJ SOLN
1.0000 mg | Freq: Once | INTRAMUSCULAR | Status: DC
  Filled 2021-05-27: qty 1

## 2021-05-27 MED ORDER — ONDANSETRON 8 MG PO TBDP
8.0000 mg | ORAL_TABLET | Freq: Once | ORAL | Status: AC
  Administered 2021-05-27: 8 mg via ORAL
  Filled 2021-05-27: qty 1

## 2021-05-27 NOTE — ED Provider Notes (Signed)
[facility] EMERGENCY DEPARTMENT Provider Note   CSN: [id number] Arrival date & time: 05/27/21  8938     History Chief Complaint  Patient presents with   Back Pain    Regina Patton is a 50 y.o. female.  HPI  Patient with significant medical history of chronic back pain, RA, hypertension presents to the emergency department chief complaint of lower back pain.  Patient states she is suffering from a sciatic nerve flareup.  Patient states she feels pain that radiates down the posterior aspect of her legs bilaterally, describes the pain as a sharp burning-like sensation, she has no associated paresthesias or weakness in lower extremities, she denies saddle paresthesias, urinary incontinency, retention, difficult bowel movements, she has no urinary symptoms, no flank tenderness.  She denies recent trauma to the area, denies history of IV drug use, associated fevers, chills, neck pain, chest pain, shortness of breath, worsening pedal edema.  States that this feels like her typical flareup of her sciatic nerve.  She is currently on methotrexate and Flexeril which she has been taking this does not seem to help with her pain.  She denies alleviating or aggravating factors. Past Medical History:  Diagnosis Date   C. 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 difficile colitis 04/19/2015   Essential hypertension 04/05/2015    Chronic pain syndrome 04/05/2015    Past Surgical History:  Procedure Laterality Date   BACK SURGERY     BLADDER SURGERY     CHOLECYSTECTOMY N/A 10/18/2015   Procedure: LAPAROSCOPIC CHOLECYSTECTOMY;  Surgeon: Franky Macho, MD;  Location: [facility];  Service: General;  Laterality: N/A;   TUBAL LIGATION     URETHRAL DIVERTICULECTOMY       OB History     Gravida  3   Para  3   Term  3   Preterm      AB      Living  3      SAB      IAB      Ectopic      Multiple      Live Births              Family History  Problem Relation Age of Onset   Cancer Mother    Diabetes Mother    Lupus Mother    Thyroid disease Mother    Colon cancer Father        AFTER AGE 47   Crohn's disease Sister     Social History   Tobacco Use   Smoking status: Never   Smokeless tobacco: Never  Vaping Use   Vaping Use: Never used  Substance Use Topics   Alcohol use: No   Drug use: No    Home Medications Prior to Admission medications   Medication  Sig Start Date End Date Taking? Authorizing Provider  cyclobenzaprine (FLEXERIL) 10 MG tablet Take 1 tablet (10 mg total) by mouth 2 (two) times daily as needed for muscle spasms. 05/16/21  Yes Wurst, [place], PA-C  methotrexate (RHEUMATREX) 2.5 MG tablet Take 17.5 mg by mouth once a week. Caution:Chemotherapy. Protect from light.   Yes [provider]  folic acid (FOLVITE) 1 MG tablet Take 1 tablet (1 mg total) by mouth daily. Patient not taking: No sig reported 01/15/21   Fuller Plan, MD  HYDROcodone-acetaminophen (NORCO/VICODIN) 5-325 MG tablet Take 1 tablet by mouth daily. Patient not taking: No sig reported 02/27/21   Fuller Plan, MD  meloxicam (MOBIC) 15 MG tablet Take 1 tablet (15 mg total) by mouth daily. Patient not taking: No sig reported 05/16/21   Wurst, [place], PA-C  pantoprazole (PROTONIX) 20 MG tablet Take 20 mg by mouth daily. Patient not taking: No sig reported 01/02/21   [provider]   predniSONE (STERAPRED UNI-PAK 21 TAB) 10 MG (21) TBPK tablet Take by mouth daily. Take 6 tabs by mouth daily  for 2 days, then 5 tabs for 2 days, then 4 tabs for 2 days, then 3 tabs for 2 days, 2 tabs for 2 days, then 1 tab by mouth daily for 2 days Patient not taking: No sig reported 05/10/21   Merrilee Jansky, MD  dicyclomine (BENTYL) 20 MG tablet Take 1 tablet (20 mg total) by mouth 2 (two) times daily. Patient not taking: Reported on 11/26/2015 10/12/15 11/26/15  Gilda Crease, MD  diphenhydrAMINE (BENADRYL) 25 MG tablet Take 1 tablet (25 mg total) by mouth every 4 (four) hours as needed for itching. Patient not taking: Reported on 04/25/2015 04/11/15 05/04/15  Triplett, Babette Relic, PA-C  hyoscyamine (LEVSIN SL) 0.125 MG SL tablet Place 1 tablet (0.125 mg total) under the tongue every 4 (four) hours as needed. Patient not taking: Reported on 09/26/2015 06/04/15 10/12/15  Gelene Mink, NP  metoCLOPramide (REGLAN) 10 MG tablet Take 1 tablet (10 mg total) by mouth every 8 (eight) hours as needed for nausea or vomiting. Patient not taking: Reported on 11/26/2015 10/13/15 11/26/15  Loren Racer, MD  ranitidine (ZANTAC) 150 MG tablet Take 1 tablet (150 mg total) by mouth 2 (two) times daily. Patient not taking: Reported on 11/26/2015 10/12/15 11/26/15  Gilda Crease, MD    Allergies    Mango flavor, Penicillins, Tramadol, Zofran Frazier Richards hcl], Bactrim [sulfamethoxazole-trimethoprim], Keflex [cephalexin], and Toradol [ketorolac tromethamine]  Review of Systems   Review of Systems  Constitutional:  Negative for chills and fever.  HENT:  Negative for congestion.   Respiratory:  Negative for shortness of breath.   Cardiovascular:  Negative for chest pain.  Gastrointestinal:  Negative for abdominal pain.  Genitourinary:  Negative for enuresis.  Musculoskeletal:  Positive for back pain.  Skin:  Negative for rash.  Neurological:  Negative for dizziness, weakness and numbness.  Hematological:   Does not bruise/bleed easily.   Physical Exam Updated Vital Signs BP (!) 158/91 (BP Location: Right Arm)   Pulse 71   Temp 98 F (36.7 C) (Oral)   Resp 16   Ht 5' (1.524 m)   Wt 83.9 kg   LMP  (LMP Unknown)   SpO2 100%   BMI 36.13 kg/m   Physical Exam Vitals and nursing note reviewed.  Constitutional:      General: She is not in acute distress.    Appearance: She is not ill-appearing.  HENT:  Head: Normocephalic and atraumatic.     Nose: No congestion.  Eyes:     Conjunctiva/sclera: Conjunctivae normal.  Cardiovascular:     Rate and Rhythm: Normal rate and regular rhythm.     Pulses: Normal pulses.     Heart sounds: No murmur heard.   No friction rub. No gallop.  Pulmonary:     Effort: No respiratory distress.     Breath sounds: No wheezing, rhonchi or rales.  Musculoskeletal:     Comments: Patient has 5 of 5 strength, neurovascular tact in the lower extremities, she is able ambulate without difficulty.  Patient does have positive straight leg raise in both legs worse on the left versus the right.  Spine was palpated was nontender to palpation, no step-off deformities present.  Patient is noted tenderness within the musculature surrounding the left and right iliac crest.  Skin:    General: Skin is warm and dry.  Neurological:     Mental Status: She is alert.  Psychiatric:        Mood and Affect: Mood normal.    ED Results / Procedures / Treatments   Labs (all labs ordered are listed, but only abnormal results are displayed) Labs Reviewed - No data to display  EKG None  Radiology No results found.  Procedures Procedures   Medications Ordered in ED Medications  HYDROmorphone (DILAUDID) injection 1 mg (has no administration in time range)  ondansetron (ZOFRAN-ODT) disintegrating tablet 8 mg (has no administration in time range)    ED Course  I have reviewed the triage vital signs and the nursing notes.  Pertinent labs & imaging results that were  available during my care of the patient were reviewed by me and considered in my medical decision making (see chart for details).    MDM Rules/Calculators/A&P                          Initial impression-patient presents with acute on chronic sciatic nerve pain.  She is alert, does not appear in acute distress, vital signs are reassuring.  I suspect acute on chronic back pain.  Will provide patient with narcotics, nausea medications and reassess.  Work-up-due to well-appearing patient, benign physical exam, further lab work and imaging not warranted at this time.  Reassessment-patient is reassessed after pain medications, she states she is feeling much better, has no complaints this time.  She is agreeable for discharge.  Rule out- I have low suspicion for spinal fracture or spinal cord abnormality as patient denies urinary incontinency, retention, difficulty with bowel movements, denies saddle paresthesias.  Spine was palpated there is no step-off, crepitus or gross deformities felt, patient had 5/5 strength, full range of motion, neurovascular fully intact in the lower extremities.  Imaging will be deferred as there is no associated trauma with pain making dislocation or fractures extremely unlikely. Low suspicion for septic arthritis as patient denies IV drug use, skin exam was performed no erythematous, edema or warm joints noted.  I have low suspicion for UTI, [place], kidney stones as patient denies any urinary symptoms, she has no flank tenderness.  I have low suspicion for AAA and/or dissection as presentation is atypical of etiology, more consistent with acute flareup of sciatica.   Plan-  Back pain since resolved-likely acute on chronic back pain, will defer steroid treatment as she has been on this chronically, we will have her continue with Flexeril, start a short course of narcotics follow-up with neurosurgery for  further evaluation.  Vital signs have remained stable, no indication for  hospital admission.    Patient given at home care as well strict return precautions.  Patient verbalized that they understood agreed to said plan.  Final Clinical Impression(s) / ED Diagnoses Final diagnoses:  None    Rx / DC Orders ED Discharge Orders     None        Carroll Sage, PA-C 05/27/21 1210    Mancel Bale, MD 05/27/21 2011

## 2021-05-27 NOTE — Discharge Instructions (Addendum)
You have been seen here for back pain. I recommend taking over-the-counter pain medications like ibuprofen and/or Tylenol every 6 as needed.  Please follow dosage and on the back of bottle.  I also recommend applying heat to the area and stretching out the muscles as this will help decrease stiffness and pain.  I have given you information on exercises please follow. I have given you a short course of narcotics please take as prescribed.  This medication can make you drowsy do not consume alcohol or operate heavy machinery when taking this medication.  This medication is Tylenol in it do not take Tylenol and take this medication.   Please follow-up with neurosurgery for further evaluation.  Come back to the emergency department if you develop chest pain, shortness of breath, severe abdominal pain, uncontrolled nausea, vomiting, diarrhea.

## 2021-05-27 NOTE — ED Triage Notes (Signed)
Pt reports pain in lower back that started Sunday morning.  Denies injury.  Reports pain radiates down both legs.

## 2021-05-30 ENCOUNTER — Ambulatory Visit: Payer: Medicare Other | Admitting: Physical Medicine and Rehabilitation

## 2021-09-29 ENCOUNTER — Ambulatory Visit
Admission: EM | Admit: 2021-09-29 | Discharge: 2021-09-29 | Disposition: A | Discharge status: Home / Self Care | Payer: Medicare Other | Attending: Family Medicine | Admitting: Family Medicine

## 2021-09-29 ENCOUNTER — Other Ambulatory Visit: Payer: Self-pay

## 2021-09-29 DIAGNOSIS — M5431 Sciatica, right side: Secondary | ICD-10-CM

## 2021-09-29 DIAGNOSIS — H66012 Acute suppurative otitis media with spontaneous rupture of ear drum, left ear: Secondary | ICD-10-CM | POA: Diagnosis not present

## 2021-09-29 MED ORDER — AZITHROMYCIN 250 MG PO TABS: ORAL_TABLET | ORAL | 0 refills | Status: DC

## 2021-09-29 MED ORDER — PREDNISONE 20 MG PO TABS: 40.0000 mg | ORAL_TABLET | Freq: Every day | ORAL | 0 refills | Status: DC

## 2021-09-29 MED ORDER — CYCLOBENZAPRINE HCL 5 MG PO TABS: 5.0000 mg | ORAL_TABLET | Freq: Three times a day (TID) | ORAL | 0 refills | Status: DC | PRN

## 2021-09-29 NOTE — ED Triage Notes (Signed)
Patient states she she thinks she has an ear infection in her left ear since yesterday morning and her sciatica is causing some pain since Saturday  Patient states she has been taking tylenol and Ibuprofen, last dose 2 hours ago.

## 2021-09-29 NOTE — ED Provider Notes (Signed)
[facility]    CSN: [id number] Arrival date & time: 09/29/21  1330      History   Chief Complaint Chief Complaint  Patient presents with   Otalgia    Ear ache and back pain    HPI Regina Patton is a 51 y.o. female.   Presenting today with 1 to 2-day history of progressively worsening left ear pain, now with bloody drainage from the ear.  She states she also started around the same time with congestion, cough.  Denies fever, chills, headache, sore throat, abdominal pain, nausea vomiting or diarrhea.  Has been taking ibuprofen and Tylenol with minimal temporary relief.  She is also having a flareup of her sciatica issues, currently on the right side but has been on the left side in the past as well.  She states she is usually able to do stretches and take ibuprofen and ease these off but this 1 is not improving with her typical over-the-counter measures.  Denies numbness, tingling, weakness, saddle anesthesia, bowel or bladder incontinence, new injury to the area.   Past Medical History:  Diagnosis Date   C. difficile diarrhea    Cervical radiculopathy    Chronic abdominal pain    Chronic back pain    Chronic knee pain    Chronic neck pain    Diabetes mellitus without complication (HCC)    diet controlled   Fibromyalgia    Hypertension    Rheumatoid arthritis ([place])    "Rhematoid factor positive but no symptoms" per EPIC notes 2012   Rib pain on right side    chronic   Sciatic pain    right    Patient Active Problem List   Diagnosis Date Noted   Opioid use 02/27/2021   Seropositive rheumatoid arthritis ([place]) 01/15/2021   High risk medication use 01/15/2021   Cholecystitis with cholelithiasis 10/17/2015   C. difficile colitis 04/19/2015   Essential hypertension 04/05/2015   Chronic pain syndrome 04/05/2015    Past Surgical History:  Procedure Laterality Date   BACK SURGERY     BLADDER SURGERY     CHOLECYSTECTOMY N/A 10/18/2015   Procedure:  LAPAROSCOPIC CHOLECYSTECTOMY;  Surgeon: Aviva Signs, MD;  Location: [facility];  Service: General;  Laterality: N/A;   TUBAL LIGATION     URETHRAL DIVERTICULECTOMY      OB History     Gravida  3   Para  3   Term  3   Preterm      AB      Living  3      SAB      IAB      Ectopic      Multiple      Live Births               Home Medications    Prior to Admission medications   Medication Sig Start Date End Date Taking? Authorizing Provider  azithromycin (ZITHROMAX) 250 MG tablet Take first 2 tablets together, then 1 every day until finished. 09/29/21  Yes Volney American, PA-C  cyclobenzaprine (FLEXERIL) 5 MG tablet Take 1 tablet (5 mg total) by mouth 3 (three) times daily as needed for muscle spasms. Do not drink alcohol or drive while taking this medication.  May cause drowsiness. 09/29/21  Yes Volney American, PA-C  predniSONE (DELTASONE) 20 MG tablet Take 2 tablets (40 mg total) by mouth daily with breakfast. 09/29/21  Yes Volney American, PA-C  cyclobenzaprine (FLEXERIL) 10 MG  tablet Take 1 tablet (10 mg total) by mouth 2 (two) times daily as needed for muscle spasms. 05/16/21   Wurst, [place], PA-C  folic acid (FOLVITE) 1 MG tablet Take 1 tablet (1 mg total) by mouth daily. Patient not taking: No sig reported 01/15/21   Collier Salina, MD  meloxicam (MOBIC) 15 MG tablet Take 1 tablet (15 mg total) by mouth daily. Patient not taking: No sig reported 05/16/21   Wurst, [place], PA-C  methotrexate (RHEUMATREX) 2.5 MG tablet Take 17.5 mg by mouth once a week. Caution:Chemotherapy. Protect from light.    [provider]  pantoprazole (PROTONIX) 20 MG tablet Take 20 mg by mouth daily. Patient not taking: Reported on 05/27/2021 01/02/21   [provider]  predniSONE (STERAPRED UNI-PAK 21 TAB) 10 MG (21) TBPK tablet Take by mouth daily. Take 6 tabs by mouth daily  for 2 days, then 5 tabs for 2 days, then 4 tabs for 2 days, then 3 tabs for  2 days, 2 tabs for 2 days, then 1 tab by mouth daily for 2 days Patient not taking: Reported on 05/27/2021 05/10/21   Chase Picket, MD  dicyclomine (BENTYL) 20 MG tablet Take 1 tablet (20 mg total) by mouth 2 (two) times daily. Patient not taking: Reported on 11/26/2015 10/12/15 11/26/15  Orpah Greek, MD  diphenhydrAMINE (BENADRYL) 25 MG tablet Take 1 tablet (25 mg total) by mouth every 4 (four) hours as needed for itching. Patient not taking: Reported on 04/25/2015 04/11/15 05/04/15  Triplett, Lynelle Smoke, PA-C  hyoscyamine (LEVSIN SL) 0.125 MG SL tablet Place 1 tablet (0.125 mg total) under the tongue every 4 (four) hours as needed. Patient not taking: Reported on 09/26/2015 06/04/15 10/12/15  Annitta Needs, NP  metoCLOPramide (REGLAN) 10 MG tablet Take 1 tablet (10 mg total) by mouth every 8 (eight) hours as needed for nausea or vomiting. Patient not taking: Reported on 11/26/2015 10/13/15 11/26/15  Julianne Rice, MD  ranitidine (ZANTAC) 150 MG tablet Take 1 tablet (150 mg total) by mouth 2 (two) times daily. Patient not taking: Reported on 11/26/2015 10/12/15 11/26/15  Orpah Greek, MD    Family History Family History  Problem Relation Age of Onset   Cancer Mother    Diabetes Mother    Lupus Mother    Thyroid disease Mother    Colon cancer Father        AFTER AGE 88   Crohn's disease Sister     Social History Social History   Tobacco Use   Smoking status: Never   Smokeless tobacco: Never  Vaping Use   Vaping Use: Never used  Substance Use Topics   Alcohol use: No   Drug use: No     Allergies   Mango flavor, Penicillins, Tramadol, Zofran [ondansetron hcl], Bactrim [sulfamethoxazole-trimethoprim], Keflex [cephalexin], and Toradol [ketorolac tromethamine]   Review of Systems Review of Systems Per HPI  Physical Exam Triage Vital Signs ED Triage Vitals  Enc Vitals Group     BP 09/29/21 1339 128/89     Pulse Rate 09/29/21 1339 75     Resp 09/29/21 1339 16     Temp  09/29/21 1339 98 F (36.7 C)     Temp Source 09/29/21 1339 Oral     SpO2 09/29/21 1339 96 %     Weight --      Height --      Head Circumference --      Peak Flow --  Pain Score 09/29/21 1336 8     Pain Loc --      Pain Edu? --      Excl. in [place]? --    No data found.  Updated Vital Signs BP 128/89 (BP Location: Right Arm)    Pulse 75    Temp 98 F (36.7 C) (Oral)    Resp 16    SpO2 96%   Visual Acuity Right Eye Distance:   Left Eye Distance:   Bilateral Distance:    Right Eye Near:   Left Eye Near:    Bilateral Near:     Physical Exam Vitals and nursing note reviewed.  Constitutional:      Appearance: Normal appearance. She is not ill-appearing.  HENT:     Head: Atraumatic.     Right Ear: Tympanic membrane normal.     Ears:     Comments: Small perforation to left TM, bloody discharge present in ear canal.  Left TM erythematous, edematous    Nose: Rhinorrhea present.     Mouth/Throat:     Mouth: Mucous membranes are moist.     Pharynx: Oropharynx is clear.  Eyes:     Extraocular Movements: Extraocular movements intact.     Conjunctiva/sclera: Conjunctivae normal.  Cardiovascular:     Rate and Rhythm: Normal rate and regular rhythm.     Heart sounds: Normal heart sounds.  Pulmonary:     Effort: Pulmonary effort is normal.     Breath sounds: Normal breath sounds.  Musculoskeletal:        General: Normal range of motion.     Cervical back: Normal range of motion and neck supple.     Comments: No midline spinal tenderness to palpation diffusely.  Negative straight leg raise bilateral lower extremities.  Tender to palpation right lumbar paraspinal muscles and piriformis  Skin:    General: Skin is warm and dry.  Neurological:     Mental Status: She is alert and oriented to person, place, and time.     Motor: No weakness.     Gait: Gait normal.  Psychiatric:        Mood and Affect: Mood normal.        Thought Content: Thought content normal.        Judgment:  Judgment normal.   UC Treatments / Results  Labs (all labs ordered are listed, but only abnormal results are displayed) Labs Reviewed - No data to display  EKG   Radiology No results found.  Procedures Procedures (including critical care time)  Medications Ordered in UC Medications - No data to display  Initial Impression / Assessment and Plan / UC Course  I have reviewed the triage vital signs and the nursing notes.  Pertinent labs & imaging results that were available during my care of the patient were reviewed by me and considered in my medical decision making (see chart for details).     Vital signs benign and reassuring, will treat with azithromycin for left ear infection with small perforation, prednisone and Flexeril for sciatica flare.  Discussed supportive home care, over-the-counter remedies and close follow-up.  Return for acutely worsening symptoms.  Final Clinical Impressions(s) / UC Diagnoses   Final diagnoses:  Non-recurrent acute suppurative otitis media of left ear with spontaneous rupture of tympanic membrane  Right sided sciatica   Discharge Instructions   None    ED Prescriptions     Medication Sig Dispense Auth. Provider   predniSONE (DELTASONE) 20 MG tablet Take  2 tablets (40 mg total) by mouth daily with breakfast. 10 tablet Volney American, PA-C   cyclobenzaprine (FLEXERIL) 5 MG tablet Take 1 tablet (5 mg total) by mouth 3 (three) times daily as needed for muscle spasms. Do not drink alcohol or drive while taking this medication.  May cause drowsiness. 15 tablet Volney American, [place]   azithromycin (ZITHROMAX) 250 MG tablet Take first 2 tablets together, then 1 every day until finished. 6 tablet Volney American, [place]      PDMP not reviewed this encounter.   Volney American, [place] 09/29/21 1547

## 2021-11-05 DIAGNOSIS — U071 COVID-19: Secondary | ICD-10-CM

## 2021-11-05 HISTORY — DX: COVID-19: U07.1

## 2021-12-10 ENCOUNTER — Encounter (INDEPENDENT_AMBULATORY_CARE_PROVIDER_SITE_OTHER): Payer: Self-pay | Admitting: *Deleted

## 2021-12-16 ENCOUNTER — Ambulatory Visit
Admission: EM | Admit: 2021-12-16 | Discharge: 2021-12-16 | Disposition: A | Discharge status: Home / Self Care | Payer: Medicare Other

## 2021-12-16 DIAGNOSIS — U071 COVID-19: Secondary | ICD-10-CM | POA: Diagnosis not present

## 2021-12-16 DIAGNOSIS — R070 Pain in throat: Secondary | ICD-10-CM

## 2021-12-16 DIAGNOSIS — M5441 Lumbago with sciatica, right side: Secondary | ICD-10-CM

## 2021-12-16 DIAGNOSIS — H938X3 Other specified disorders of ear, bilateral: Secondary | ICD-10-CM | POA: Diagnosis not present

## 2021-12-16 DIAGNOSIS — R0981 Nasal congestion: Secondary | ICD-10-CM

## 2021-12-16 MED ORDER — LEVOCETIRIZINE DIHYDROCHLORIDE 5 MG PO TABS: 5.0000 mg | ORAL_TABLET | Freq: Every evening | ORAL | 0 refills | Status: DC

## 2021-12-16 MED ORDER — PREDNISONE 50 MG PO TABS: 50.0000 mg | ORAL_TABLET | Freq: Every day | ORAL | 0 refills | Status: DC

## 2021-12-16 MED ORDER — PROMETHAZINE-DM 6.25-15 MG/5ML PO SYRP: 5.0000 mL | ORAL_SOLUTION | Freq: Three times a day (TID) | ORAL | 0 refills | Status: DC | PRN

## 2021-12-16 MED ORDER — TIZANIDINE HCL 4 MG PO TABS: 4.0000 mg | ORAL_TABLET | Freq: Every day | ORAL | 0 refills | Status: DC

## 2021-12-16 NOTE — ED Provider Notes (Signed)
?[facility] ? ? ?MRN: [id number] DOB: September 14, 1970 ? ?Subjective:  ? ?Regina Patton is a 51 y.o. female presenting for 3-day history of acute onset sinus congestion, bilateral ear fullness and pain, coughing.  Patient tested herself for COVID-19 1 week ago when she had loss of taste and smell and felt fatigue, turned out to be positive.  No fever, facial pain, chest pain, shortness of breath or wheezing.  No history of asthma, smoking.  She has also had recurrent low back pain with radiation into both legs but much worse into the right leg.  No changes to bowel or urinary habits.  No weakness, numbness or tingling.  She does have a history of back surgery for the same problems.  Has not followed up with her back surgeon in a while.  Of note, patient was last seen for this 09/29/2021, was started on azithromycin to address the otitis media she had of the left side. ? ?No current facility-administered medications for this encounter. ? ?Current Outpatient Medications:  ?  acetaminophen (TYLENOL) 500 MG tablet, Take 500 mg by mouth every 6 (six) hours as needed., Disp: , Rfl:  ?  azithromycin (ZITHROMAX) 250 MG tablet, Take first 2 tablets together, then 1 every day until finished., Disp: 6 tablet, Rfl: 0 ?  cyclobenzaprine (FLEXERIL) 10 MG tablet, Take 1 tablet (10 mg total) by mouth 2 (two) times daily as needed for muscle spasms., Disp: 20 tablet, Rfl: 0 ?  cyclobenzaprine (FLEXERIL) 5 MG tablet, Take 1 tablet (5 mg total) by mouth 3 (three) times daily as needed for muscle spasms. Do not drink alcohol or drive while taking this medication.  May cause drowsiness., Disp: 15 tablet, Rfl: 0 ?  folic acid (FOLVITE) 1 MG tablet, Take 1 tablet (1 mg total) by mouth daily. (Patient not taking: Reported on 05/27/2021), Disp: 90 tablet, Rfl: 0 ?  meloxicam (MOBIC) 15 MG tablet, Take 1 tablet (15 mg total) by mouth daily. (Patient not taking: Reported on 05/27/2021), Disp: 20 tablet, Rfl: 0 ?  methotrexate  (RHEUMATREX) 2.5 MG tablet, Take 17.5 mg by mouth once a week. Caution:Chemotherapy. Protect from light., Disp: , Rfl:  ?  pantoprazole (PROTONIX) 20 MG tablet, Take 20 mg by mouth daily. (Patient not taking: Reported on 05/27/2021), Disp: , Rfl:  ?  predniSONE (DELTASONE) 20 MG tablet, Take 2 tablets (40 mg total) by mouth daily with breakfast., Disp: 10 tablet, Rfl: 0 ?  predniSONE (STERAPRED UNI-PAK 21 TAB) 10 MG (21) TBPK tablet, Take by mouth daily. Take 6 tabs by mouth daily  for 2 days, then 5 tabs for 2 days, then 4 tabs for 2 days, then 3 tabs for 2 days, 2 tabs for 2 days, then 1 tab by mouth daily for 2 days (Patient not taking: Reported on 05/27/2021), Disp: 42 tablet, Rfl: 0  ? ?Allergies  ?Allergen Reactions  ? Mango Flavor Anaphylaxis and Swelling  ?  Lips swelling  ? Penicillins Anaphylaxis and Hives  ?  Has patient had a PCN reaction causing immediate rash, facial/tongue/throat swelling, SOB or lightheadedness with hypotension: Yes ?Has patient had a PCN reaction causing severe rash involving mucus membranes or skin necrosis: No ?Has patient had a PCN reaction that required hospitalization No ?Has patient had a PCN reaction occurring within the last 10 years: No ?If all of the above answers are "NO", then may proceed with Cephalosporin use. ?  ? Oxycodone Hives  ? Tramadol Nausea And Vomiting  ? Zofran Alvis Lemmings  Hcl] Other (See Comments)  ?  headache  ? Bactrim [Sulfamethoxazole-Trimethoprim] Itching and Rash  ? Keflex [Cephalexin] Hives, Itching and Rash  ?  Patient reports 04/04/16 that she has taken this medication several times in the past and has only developed "itching" but denies rash, hives, anaphylactic reaction, angioedema.  ? Toradol [Ketorolac Tromethamine] Rash  ? ? ?Past Medical History:  ?Diagnosis Date  ? C. difficile diarrhea   ? Cervical radiculopathy   ? Chronic abdominal pain   ? Chronic back pain   ? Chronic knee pain   ? Chronic neck pain   ? Diabetes mellitus without  complication ([place])   ? diet controlled  ? Fibromyalgia   ? Hypertension   ? Rheumatoid arthritis ([place])   ? "Rhematoid factor positive but no symptoms" per EPIC notes 2012  ? Rib pain on right side   ? chronic  ? Sciatic pain   ? right  ?  ? ?Past Surgical History:  ?Procedure Laterality Date  ? BACK SURGERY    ? BLADDER SURGERY    ? CHOLECYSTECTOMY N/A 10/18/2015  ? Procedure: LAPAROSCOPIC CHOLECYSTECTOMY;  Surgeon: Aviva Signs, MD;  Location: [facility];  Service: General;  Laterality: N/A;  ? TUBAL LIGATION    ? URETHRAL DIVERTICULECTOMY    ? ? ?Family History  ?Problem Relation Age of Onset  ? Cancer Mother   ? Diabetes Mother   ? Lupus Mother   ? Thyroid disease Mother   ? Colon cancer Father   ?     AFTER AGE 53  ? Crohn's disease Sister   ? ? ?Social History  ? ?Tobacco Use  ? Smoking status: Never  ? Smokeless tobacco: Never  ?Vaping Use  ? Vaping Use: Never used  ?Substance Use Topics  ? Alcohol use: No  ? Drug use: No  ? ? ?ROS ? ? ?Objective:  ? ?Vitals: ?BP (!) 136/94 (BP Location: Right Arm)   Pulse 79   Temp 98.4 ?F (36.9 ?C) (Oral)   Resp 18   SpO2 96%  ? ?Physical Exam ?Constitutional:   ?   General: She is not in acute distress. ?   Appearance: Normal appearance. She is well-developed and normal weight. She is not ill-appearing, toxic-appearing or diaphoretic.  ?HENT:  ?   Head: Normocephalic and atraumatic.  ?   Right Ear: Tympanic membrane, ear canal and external ear normal. No drainage or tenderness. No middle ear effusion. There is no impacted cerumen. Tympanic membrane is not erythematous.  ?   Left Ear: Tympanic membrane, ear canal and external ear normal. No drainage or tenderness.  No middle ear effusion. There is no impacted cerumen. Tympanic membrane is not erythematous.  ?   Nose: Nose normal. No congestion or rhinorrhea.  ?   Mouth/Throat:  ?   Mouth: Mucous membranes are moist. No oral lesions.  ?   Pharynx: No pharyngeal swelling, oropharyngeal exudate, posterior oropharyngeal erythema  or uvula swelling.  ?   Tonsils: No tonsillar exudate or tonsillar abscesses.  ?Eyes:  ?   General: No scleral icterus.    ?   Right eye: No discharge.     ?   Left eye: No discharge.  ?   Extraocular Movements: Extraocular movements intact.  ?   Right eye: Normal extraocular motion.  ?   Left eye: Normal extraocular motion.  ?   Conjunctiva/sclera: Conjunctivae normal.  ?Cardiovascular:  ?   Rate and Rhythm: Normal rate.  ?   Heart  sounds: No murmur heard. ?  No friction rub. No gallop.  ?Pulmonary:  ?   Effort: Pulmonary effort is normal. No respiratory distress.  ?   Breath sounds: No stridor. No wheezing, rhonchi or rales.  ?Chest:  ?   Chest wall: No tenderness.  ?Musculoskeletal:  ?   Cervical back: Normal range of motion and neck supple.  ?   Lumbar back: Tenderness (over area outlined) present. No swelling, edema, deformity, signs of trauma, lacerations, spasms or bony tenderness. Decreased range of motion. Positive right straight leg raise test. Negative left straight leg raise test. No scoliosis.  ?     Back: ? ?Lymphadenopathy:  ?   Cervical: No cervical adenopathy.  ?Skin: ?   General: Skin is warm and dry.  ?Neurological:  ?   General: No focal deficit present.  ?   Mental Status: She is alert and oriented to person, place, and time.  ?   Motor: No weakness.  ?   Coordination: Coordination normal.  ?   Gait: Gait normal.  ?   Deep Tendon Reflexes: Reflexes normal.  ?Psychiatric:     ?   Mood and Affect: Mood normal.     ?   Behavior: Behavior normal.  ? ? ?Assessment and Plan :  ? ?PDMP not reviewed this encounter. ? ?1. COVID-19 virus infection   ?2. Ear fullness, bilateral   ?3. Sinus congestion   ?4. Throat pain   ?5. Acute back pain with sciatica, right   ? ?I believe the symptoms she is experiencing are largely COVID-related as opposed to a bacterial sinusitis or otitis media given physical exam findings.  Regarding her back, suspect that she has recurrent sciatica likely made worse from  degenerative or surgical changes.  She does have a history of anterolisthesis of the lumbar region between L4-S1 as seen on her x-ray from 04/12/2021.  This is also contributing and therefore emphasized need to follow-up with a

## 2021-12-16 NOTE — ED Triage Notes (Signed)
Pt reports bilateral ear pain, cough and nasal congestion x 3 days. Pt reports she tested positive for COVID 1 week ago.  ? ?Lower back pain, radiates to legs, worse in right leg  x 2 days.  ?

## 2022-01-16 ENCOUNTER — Other Ambulatory Visit: Payer: Self-pay

## 2022-01-16 ENCOUNTER — Emergency Department (HOSPITAL_COMMUNITY)
Admission: EM | Admit: 2022-01-16 | Discharge: 2022-01-16 | Discharge status: Against Medical Advice | Payer: Medicare Other | Attending: Emergency Medicine | Admitting: Emergency Medicine

## 2022-01-16 ENCOUNTER — Encounter (HOSPITAL_COMMUNITY): Payer: Self-pay | Admitting: *Deleted

## 2022-01-16 ENCOUNTER — Ambulatory Visit
Admission: EM | Admit: 2022-01-16 | Discharge: 2022-01-16 | Disposition: A | Discharge status: Home / Self Care | Payer: Medicare Other | Attending: Emergency Medicine | Admitting: Emergency Medicine

## 2022-01-16 ENCOUNTER — Encounter: Payer: Self-pay | Admitting: Emergency Medicine

## 2022-01-16 DIAGNOSIS — M5459 Other low back pain: Secondary | ICD-10-CM | POA: Insufficient documentation

## 2022-01-16 DIAGNOSIS — M5441 Lumbago with sciatica, right side: Secondary | ICD-10-CM

## 2022-01-16 DIAGNOSIS — M5442 Lumbago with sciatica, left side: Secondary | ICD-10-CM | POA: Diagnosis not present

## 2022-01-16 DIAGNOSIS — Z5321 Procedure and treatment not carried out due to patient leaving prior to being seen by health care provider: Secondary | ICD-10-CM | POA: Insufficient documentation

## 2022-01-16 NOTE — ED Provider Notes (Signed)
HPI ? ?SUBJECTIVE: ? ?Regina Patton is a 51 y.o. female who presents with the acute onset of burning midline/bilateral low back pain for the past 2 days.  It is worse on the left.  She states this is very different quality than her usual back pain.  This back pain is burning, her usual back pain is more throbbing.  It is not responding to usual measures.  She states that it radiates down the side and front of both of her legs.  No trauma, recent heavy lifting, change in physical activity.  No fevers, urinary complaints, abdominal pain.  No leg weakness, distal numbness or tingling, pain worse at night, saddle anesthesia, urinary or fecal incontinence, urinary retention, unintentional weight loss, syncope.  She has been alternating 800 mg of ibuprofen 3 times daily with 1000 mg of Tylenol every 4 hours, tried heat, Epsom salt soaks, Mobic, propping herself up with pillows, home PT exercises without improvement in her symptoms.  She states that her back pain usually improves/resolves with this.  No alleviating factors.  No aggravating factors.  Is not associated with movement, bending forward or with lying down.  She has a past medical history of rheumatoid arthritis, on methotrexate, fibromyalgia, varicella, chronic back pain status post lumbar surgery 3 years ago in [place].  She is not currently on any steroids.  No history of diabetes, hypertension or cancer, osteoporosis, aortic abdominal aneurysm, IVDU.  PCP: [facility]. ? ? ?Past Medical History:  ?Diagnosis Date  ? C. difficile diarrhea   ? Cervical radiculopathy   ? Chronic abdominal pain   ? Chronic back pain   ? Chronic knee pain   ? Chronic neck pain   ? Diabetes mellitus without complication ([facility])   ? diet controlled  ? Fibromyalgia   ? Hypertension   ? Rheumatoid arthritis ([facility])   ? "Rhematoid factor positive but no symptoms" per EPIC notes 2012  ? Rib pain on right side   ? chronic  ? Sciatic pain   ? right  ? ? ?Past Surgical History:   ?Procedure Laterality Date  ? BACK SURGERY    ? BLADDER SURGERY    ? CHOLECYSTECTOMY N/A 10/18/2015  ? Procedure: LAPAROSCOPIC CHOLECYSTECTOMY;  Surgeon: Franky Macho, MD;  Location: [facility];  Service: General;  Laterality: N/A;  ? TUBAL LIGATION    ? URETHRAL DIVERTICULECTOMY    ? ? ?Family History  ?Problem Relation Age of Onset  ? Cancer Mother   ? Diabetes Mother   ? Lupus Mother   ? Thyroid disease Mother   ? Colon cancer Father   ?     AFTER AGE 23  ? Crohn's disease Sister   ? ? ?Social History  ? ?Tobacco Use  ? Smoking status: Never  ? Smokeless tobacco: Never  ?Vaping Use  ? Vaping Use: Never used  ?Substance Use Topics  ? Alcohol use: No  ? Drug use: No  ? ? ?No current facility-administered medications for this encounter. ? ?Current Outpatient Medications:  ?  acetaminophen (TYLENOL) 500 MG tablet, Take 500 mg by mouth every 6 (six) hours as needed., Disp: , Rfl:  ?  cyclobenzaprine (FLEXERIL) 10 MG tablet, Take 1 tablet (10 mg total) by mouth 2 (two) times daily as needed for muscle spasms., Disp: 20 tablet, Rfl: 0 ?  cyclobenzaprine (FLEXERIL) 5 MG tablet, Take 1 tablet (5 mg total) by mouth 3 (three) times daily as needed for muscle spasms. Do not drink alcohol or drive  while taking this medication.  May cause drowsiness., Disp: 15 tablet, Rfl: 0 ?  folic acid (FOLVITE) 1 MG tablet, Take 1 tablet (1 mg total) by mouth daily. (Patient not taking: Reported on 05/27/2021), Disp: 90 tablet, Rfl: 0 ?  levocetirizine (XYZAL) 5 MG tablet, Take 1 tablet (5 mg total) by mouth every evening., Disp: 90 tablet, Rfl: 0 ?  meloxicam (MOBIC) 15 MG tablet, Take 1 tablet (15 mg total) by mouth daily. (Patient not taking: Reported on 05/27/2021), Disp: 20 tablet, Rfl: 0 ?  methotrexate (RHEUMATREX) 2.5 MG tablet, Take 17.5 mg by mouth once a week. Caution:Chemotherapy. Protect from light., Disp: , Rfl:  ?  pantoprazole (PROTONIX) 20 MG tablet, Take 20 mg by mouth daily. (Patient not taking: Reported on 05/27/2021), Disp: ,  Rfl:  ?  predniSONE (DELTASONE) 50 MG tablet, Take 1 tablet (50 mg total) by mouth daily with breakfast., Disp: 5 tablet, Rfl: 0 ?  promethazine-dextromethorphan (PROMETHAZINE-DM) 6.25-15 MG/5ML syrup, Take 5 mLs by mouth 3 (three) times daily as needed for cough., Disp: 200 mL, Rfl: 0 ?  tiZANidine (ZANAFLEX) 4 MG tablet, Take 1 tablet (4 mg total) by mouth at bedtime., Disp: 30 tablet, Rfl: 0 ? ?Allergies  ?Allergen Reactions  ? Mango Flavor Anaphylaxis and Swelling  ?  Lips swelling  ? Penicillins Anaphylaxis and Hives  ?  Has patient had a PCN reaction causing immediate rash, facial/tongue/throat swelling, SOB or lightheadedness with hypotension: Yes ?Has patient had a PCN reaction causing severe rash involving mucus membranes or skin necrosis: No ?Has patient had a PCN reaction that required hospitalization No ?Has patient had a PCN reaction occurring within the last 10 years: No ?If all of the above answers are "NO", then may proceed with Cephalosporin use. ?  ? Oxycodone Hives  ? Tramadol Nausea And Vomiting  ? Zofran [Ondansetron Hcl] Other (See Comments)  ?  headache  ? Bactrim [Sulfamethoxazole-Trimethoprim] Itching and Rash  ? Keflex [Cephalexin] Hives, Itching and Rash  ?  Patient reports 04/04/16 that she has taken this medication several times in the past and has only developed "itching" but denies rash, hives, anaphylactic reaction, angioedema.  ? Toradol [Ketorolac Tromethamine] Rash  ? ? ? ?ROS ? ?As noted in HPI.  ? ?Physical Exam ? ?BP (!) 141/90 (BP Location: Right Arm)   Pulse 87   Temp 98.4 ?F (36.9 ?C) (Oral)   Resp 18   Ht 5' (1.524 m)   Wt 84.8 kg   SpO2 95%   BMI 36.52 kg/m?  ? ?Constitutional: Well developed, well nourished, no acute distress ?Eyes:  EOMI, conjunctiva normal bilaterally ?HENT: Normocephalic, atraumatic,mucus membranes moist ?Respiratory: Normal inspiratory effort ?Cardiovascular: Normal rate ?GI: nondistended. No suprapubic tenderness ?skin: No rash over the back,  skin intact ?Musculoskeletal: no CVAT.  No paralumbar tenderness, no muscle spasm.  Positive midline tenderness over the surgical scar.  States this is normally not tender.  No SI joint, sacral bony tenderness. Bilateral lower extremities nontender, baseline ROM with intact DP  pulses.  Sensation along inner thighs and distally intact.  Pain with active flexion/extension at the ankles, knees, hips bilaterally.  Pain with passive int/ext rotation, AD/ABduction of the left hip.  SLR neg bilaterally. , DTR's symmetric and intact bilaterally KJ, Motor symmetric bilateral 5/5 hip flexion, quadriceps, hamstrings, EHL, foot dorsiflexion, foot plantarflexion, gait somewhat antalgic but without apparent new ataxia. ?Neurologic: Alert & oriented x 3, no focal neuro deficits ?Psychiatric: Speech and behavior appropriate ? ? ?ED Course ? ? ?  Medications - No data to display ? ?No orders of the defined types were placed in this encounter. ? ? ?No results found for this or any previous visit (from the past 24 hour(s)). ?No results found. ? ?ED Clinical Impression ? ?1. Acute bilateral low back pain with bilateral sciatica   ? ? ?ED Assessment/Plan ? ?Patient with acute onset of constant back pain that radiates down to bilateral thighs.  It sounds very neuropathic.  She states that this is very different than her usual back pain.  She has pain with all movement of her lower extremities bilaterally, but no apparent weakness,  sensory deficits.  Concern for acute disc herniation, nerve root impingement.  She denies urinary, fecal incontinence or urinary retention that would be suggestive of cauda equina.  However, I think that we would not get the information we need from plain films.  Transferring to the ED for consideration of advanced imaging and further management. ? ?Discussed rationale for transfer to the emergency department with the patient.  She is stable to go by private vehicle.  She agrees to go.  Staff notified the ED  charge nurse. ? ?No orders of the defined types were placed in this encounter. ? ? ?*This clinic note was created using Scientist, clinical (histocompatibility and immunogenetics). Therefore, there may be occasional mistakes despite careful proo

## 2022-01-16 NOTE — ED Notes (Signed)
Patient is being discharged from the Urgent Care and sent to the Emergency Department via POV . Per MD, patient is in need of higher level of care due to possible spinal nerve impingement/MRI. Patient is aware and verbalizes understanding of plan of care.  ?Vitals:  ? 01/16/22 1433  ?BP: (!) 141/90  ?Pulse: 87  ?Resp: 18  ?Temp: 98.4 ?F (36.9 ?C)  ?SpO2: 95%  ? ? ?

## 2022-01-16 NOTE — ED Notes (Signed)
Report given to Brandi,RN at [facility]. ?

## 2022-01-16 NOTE — Discharge Instructions (Addendum)
I am worried that you have an acute compression of nerve roots, disc herniation.  Unfortunately, I only have plain films here, and I do not think that we would get the information that we need to rule out serious cause of your symptoms with just plain films.  Let them know if your pain changes, gets worse, or for other concerns. ?

## 2022-01-16 NOTE — ED Triage Notes (Signed)
Pt in from [facility] for further eval for back pain with AVS papers that report concern for acvute compression of nerve roots, disc herniation, plain films completed today, pt reports Lower L & mid back pain onset Weds worsening with different sensation from normal sciatic pain, pt ambulatory, pt denies bowel and bladder incontinence, A&Ox4 ?

## 2022-01-16 NOTE — ED Triage Notes (Addendum)
Pt reports lower back pain since Wednesday night. Pt reports was asleep and reports woke up to intense back pain and bilateral leg pain. Pt reports sciatica at baseline but reports "this burning is different and tylenol is not helping."   ? ?Pt reports pain is at base of spine with intermittent radiation to bilateral lower extremities. Pt able to ambulate with steady gait. Denies any extremity numbness at this time.Pt denies any known injury or gi/gu symptoms in triage. ?

## 2022-01-21 ENCOUNTER — Emergency Department (HOSPITAL_COMMUNITY)
Admission: EM | Admit: 2022-01-21 | Discharge: 2022-01-21 | Disposition: A | Discharge status: Home / Self Care | Payer: Medicare Other | Attending: Emergency Medicine | Admitting: Emergency Medicine

## 2022-01-21 ENCOUNTER — Emergency Department (HOSPITAL_COMMUNITY): Payer: Medicare Other

## 2022-01-21 ENCOUNTER — Encounter (HOSPITAL_COMMUNITY): Payer: Self-pay | Admitting: Emergency Medicine

## 2022-01-21 ENCOUNTER — Other Ambulatory Visit: Payer: Self-pay

## 2022-01-21 DIAGNOSIS — M545 Low back pain, unspecified: Secondary | ICD-10-CM | POA: Insufficient documentation

## 2022-01-21 DIAGNOSIS — I10 Essential (primary) hypertension: Secondary | ICD-10-CM | POA: Diagnosis not present

## 2022-01-21 MED ORDER — DEXAMETHASONE SODIUM PHOSPHATE 10 MG/ML IJ SOLN
10.0000 mg | Freq: Once | INTRAMUSCULAR | Status: AC
  Administered 2022-01-21: 10 mg via INTRAMUSCULAR
  Filled 2022-01-21: qty 1

## 2022-01-21 NOTE — ED Triage Notes (Signed)
Pt c/o lower back pain that radiates to bilateral thighs.  ?

## 2022-01-21 NOTE — ED Provider Notes (Signed)
[facility] EMERGENCY DEPARTMENT Provider Note   CSN: [id number] Arrival date & time: 01/21/22  1837     History  Chief Complaint  Patient presents with   Back Pain    Regina Patton is a 51 y.o. female.  Patient with history of hypertension and chronic pain syndrome who presents today with low back pain. She states that same has been ongoing for the past week and is worse on the left.  She states the pain is worse than her normal back pain. She states that the pain radiates down her bilateral legs and feels burning in nature. It is not responding to usual measures. She denies any known trauma, recent heavy lifting, or change in physical activity.  She denies any fevers, chills, loss of bowel or bladder function, or saddle paraesthesias.  She has been able to ambulate with some pain and without weakness, or numbness/tingling. She has been attempting to manage her pain by alternating 800 mg of ibuprofen 3 times daily with 1000 mg of Tylenol every 4 hours, with heat, Epsom salt soaks, Mobic, propping herself up with pillows, home PT exercises and lidocaine patches without improvement in her symptoms.  She states that her back pain usually improves/resolves with this. Pain is not associated with movement, bending forward or with lying down.  She has a past medical history of rheumatoid arthritis, on methotrexate, fibromyalgia, varicella, chronic back pain status post lumbar surgery 3 years ago in [place].  She is not currently on any steroids.  No history of diabetes, cancer, osteoporosis, aortic abdominal aneurysm, IVDU.  Originally presented to urgent care on 5/12 for same and was sent to the ER for further evaluation and management. Went to the ER at that time but [facility]BS due to wait times.  The history is provided by the patient. No language interpreter was used.  Back Pain     Home Medications Prior to Admission medications   Medication Sig Start Date End Date Taking? Authorizing Provider   acetaminophen (TYLENOL) 500 MG tablet Take 500 mg by mouth every 6 (six) hours as needed.    [provider]  cyclobenzaprine (FLEXERIL) 10 MG tablet Take 1 tablet (10 mg total) by mouth 2 (two) times daily as needed for muscle spasms. 05/16/21   Wurst, [place], PA-C  cyclobenzaprine (FLEXERIL) 5 MG tablet Take 1 tablet (5 mg total) by mouth 3 (three) times daily as needed for muscle spasms. Do not drink alcohol or drive while taking this medication.  May cause drowsiness. 09/29/21   Particia Nearing, PA-C  folic acid (FOLVITE) 1 MG tablet Take 1 tablet (1 mg total) by mouth daily. Patient not taking: Reported on 05/27/2021 01/15/21   Fuller Plan, MD  levocetirizine (XYZAL) 5 MG tablet Take 1 tablet (5 mg total) by mouth every evening. 12/16/21   Wallis Bamberg, PA-C  meloxicam (MOBIC) 15 MG tablet Take 1 tablet (15 mg total) by mouth daily. Patient not taking: Reported on 05/27/2021 05/16/21   Wurst, [place], PA-C  methotrexate (RHEUMATREX) 2.5 MG tablet Take 17.5 mg by mouth once a week. Caution:Chemotherapy. Protect from light.    [provider]  pantoprazole (PROTONIX) 20 MG tablet Take 20 mg by mouth daily. Patient not taking: Reported on 05/27/2021 01/02/21   [provider]  predniSONE (DELTASONE) 50 MG tablet Take 1 tablet (50 mg total) by mouth daily with breakfast. 12/16/21   Wallis Bamberg, PA-C  promethazine-dextromethorphan (PROMETHAZINE-DM) 6.25-15 MG/5ML syrup Take 5 mLs by mouth 3 (three)  times daily as needed for cough. 12/16/21   Wallis Bamberg, PA-C  tiZANidine (ZANAFLEX) 4 MG tablet Take 1 tablet (4 mg total) by mouth at bedtime. 12/16/21   Wallis Bamberg, PA-C  dicyclomine (BENTYL) 20 MG tablet Take 1 tablet (20 mg total) by mouth 2 (two) times daily. Patient not taking: Reported on 11/26/2015 10/12/15 11/26/15  Gilda Crease, MD  diphenhydrAMINE (BENADRYL) 25 MG tablet Take 1 tablet (25 mg total) by mouth every 4 (four) hours as needed for  itching. Patient not taking: Reported on 04/25/2015 04/11/15 05/04/15  Triplett, Babette Relic, PA-C  hyoscyamine (LEVSIN SL) 0.125 MG SL tablet Place 1 tablet (0.125 mg total) under the tongue every 4 (four) hours as needed. Patient not taking: Reported on 09/26/2015 06/04/15 10/12/15  Gelene Mink, NP  metoCLOPramide (REGLAN) 10 MG tablet Take 1 tablet (10 mg total) by mouth every 8 (eight) hours as needed for nausea or vomiting. Patient not taking: Reported on 11/26/2015 10/13/15 11/26/15  Loren Racer, MD  ranitidine (ZANTAC) 150 MG tablet Take 1 tablet (150 mg total) by mouth 2 (two) times daily. Patient not taking: Reported on 11/26/2015 10/12/15 11/26/15  Gilda Crease, MD      Allergies    Mango flavor, Penicillins, Oxycodone, Tramadol, Zofran [ondansetron hcl], Bactrim [sulfamethoxazole-trimethoprim], Keflex [cephalexin], and Toradol [ketorolac tromethamine]    Review of Systems   Review of Systems  Musculoskeletal:  Positive for arthralgias and back pain.  All other systems reviewed and are negative.  Physical Exam Updated Vital Signs BP (!) 150/99 (BP Location: Right Arm)   Pulse 65   Temp 98 F (36.7 C) (Oral)   Resp 16   Ht 5' (1.524 m)   Wt 81.6 kg   SpO2 98%   BMI 35.15 kg/m  Physical Exam Vitals and nursing note reviewed.  Constitutional:      General: She is not in acute distress.    Appearance: Normal appearance. She is normal weight. She is not ill-appearing, toxic-appearing or diaphoretic.  HENT:     Head: Normocephalic and atraumatic.  Cardiovascular:     Rate and Rhythm: Normal rate.  Pulmonary:     Effort: Pulmonary effort is normal. No respiratory distress.  Musculoskeletal:        General: Normal range of motion.     Cervical back: Normal range of motion.     Comments: Midline tenderness noted to the lumbar spine without stepoffs or obvious deformity. No overlying skin changes.  Well-healed surgical scar noted to the lumbar spine.  Bilateral lower  extremities nontender, full ROM and distal sensation intact. DP and PT pulses intact and 2+.  Patient able to ambulate with normal gait.  Negative straight leg raise.  Skin:    General: Skin is warm and dry.  Neurological:     General: No focal deficit present.     Mental Status: She is alert.  Psychiatric:        Mood and Affect: Mood normal.        Behavior: Behavior normal.    ED Results / Procedures / Treatments   Labs (all labs ordered are listed, but only abnormal results are displayed) Labs Reviewed - No data to display  EKG None  Radiology CT Lumbar Spine Wo Contrast  Result Date: 01/21/2022 CLINICAL DATA:  Chronic low back pain. EXAM: CT LUMBAR SPINE WITHOUT CONTRAST TECHNIQUE: Multidetector CT imaging of the lumbar spine was performed without intravenous contrast administration. Multiplanar CT image reconstructions were also generated. RADIATION DOSE  REDUCTION: This exam was performed according to the departmental dose-optimization program which includes automated exposure control, adjustment of the mA and/or kV according to patient size and/or use of iterative reconstruction technique. COMPARISON:  CT scan 11/19/2020 FINDINGS: Segmentation: There are five lumbar type vertebral bodies. The last full intervertebral disc space is labeled L5-S1. Alignment: Normal overall alignment. Very slight degenerative anterolisthesis of L4. Vertebrae: No bone lesions or fractures. Evidence of prior decompressive laminectomy L4-5 and L5-S1 Paraspinal and other soft tissues: No significant paraspinal or retroperitoneal findings. Aortic calcifications are noted but no aneurysm. Disc levels: T12-L1: No significant findings.  Mild facet disease. L1-2: Moderate facet disease but no disc protrusions, spinal or foraminal stenosis. L2-3: Moderate facet disease and slight bulging annulus but no significant spinal or foraminal stenosis. L3-4: Mild diffuse annular bulge, moderate facet disease contributing to  early spinal stenosis and mild bilateral lateral recess stenosis. No foraminal stenosis. L4-5: Decompressive laminectomy. There is a bulging uncovered disc. Severe facet disease. No foraminal stenosis. L5-S1: Decompressive laminectomy. No significant spinal or foraminal stenosis. Severe facet disease. IMPRESSION: 1. Early spinal stenosis and mild bilateral lateral recess stenosis at L3-4. 2. Postoperative changes at L4-5 and L5-S1. There is severe disease but no significant spinal or foraminal stenosis. Aortic Atherosclerosis (ICD10-I70.0). Electronically Signed   By: Rudie Meyer M.D.   On: 01/21/2022 20:46    Procedures Procedures    Medications Ordered in ED Medications  dexamethasone (DECADRON) injection 10 mg (has no administration in time range)    ED Course/ Medical Decision Making/ A&P                           Medical Decision Making Amount and/or Complexity of Data Reviewed Radiology: ordered.   Patient presents today with complaints of low back pain.  She is afebrile, nontoxic-appearing, and in no acute distress with reassuring vital signs.  She is also alert and oriented and without neurological deficits and normal neuro exam.  Patient can walk with some pain.  Given her history of spinal surgery in the same area in the past, will obtain CT imaging of the lumbar spine for further evaluation of her symptoms.   CT imaging of the lumbar spine reveals  1. Early spinal stenosis and mild bilateral lateral recess stenosis at L3-4. 2. Postoperative changes at L4-5 and L5-S1. There is severe disease but no significant spinal or foraminal stenosis.  I have personally reviewed and evaluated this imaging and agree with radiology interpretation.   Patient is without loss of bowel or bladder control.  No concern for cauda equina or epidural abscess.  No fever, night sweats, weight loss, h/o cancer, IVDU.  Given her extensive medication management at home with NSAIDs, Tylenol, stretching,  and lidocaine patches, will give IM Decadron in the ER today. Patient denies any history of diabetes.  Will also give neurosurgery referral for continued evaluation and management of her CT findings.  Patient states that her neurosurgeon that did her spinal surgery several years ago who is no longer practicing.  No further emergent concerns at this time.  She is stable for discharge, educated on red flag symptoms of prompt immediate return.  She is understanding and amenable with plan.  Discharged stable condition.  Findings and plan of care discussed with supervising physician Dr. Hyacinth Meeker who is in agreement.      Final Clinical Impression(s) / ED Diagnoses Final diagnoses:  Acute midline low back pain without sciatica  Rx / DC Orders ED Discharge Orders     None     An After Visit Summary was printed and given to the patient.     Vear ClockSmoot, Rees Santistevan A, PA-C 01/21/22 2231    Eber HongMiller, Brian, MD 01/22/22 1105

## 2022-01-21 NOTE — ED Notes (Signed)
Patient transported to CT 

## 2022-01-21 NOTE — Discharge Instructions (Addendum)
We discussed, your work-up in the ER today was reassuring for acute abnormalities.  CT imaging of your back showed nonemergent findings that can be properly managed on an outpatient basis.  I have given you a referral to neurosurgery with a number to call to schedule an appointment for further evaluation and management of your pain.  In I have also given you a shot of steroids to help with inflammation.  In the interim, please continue to take Tylenol and ibuprofen as needed for pain with addition of rest in a comfortable position and a heating pad.  I have also attached some stretching exercises to your discharge paperwork that you can do as you are able for additional management of your pain. ? ?Return if development of any new or worsening symptoms. ?

## 2022-01-26 ENCOUNTER — Encounter (INDEPENDENT_AMBULATORY_CARE_PROVIDER_SITE_OTHER): Payer: Self-pay

## 2022-01-30 ENCOUNTER — Emergency Department (HOSPITAL_COMMUNITY): Payer: Medicare Other

## 2022-01-30 ENCOUNTER — Encounter (HOSPITAL_COMMUNITY): Payer: Self-pay

## 2022-01-30 ENCOUNTER — Other Ambulatory Visit: Payer: Self-pay

## 2022-01-30 ENCOUNTER — Emergency Department (HOSPITAL_COMMUNITY)
Admission: EM | Admit: 2022-01-30 | Discharge: 2022-01-30 | Disposition: A | Discharge status: Home / Self Care | Payer: Medicare Other | Attending: Emergency Medicine | Admitting: Emergency Medicine

## 2022-01-30 DIAGNOSIS — M545 Low back pain, unspecified: Secondary | ICD-10-CM | POA: Diagnosis present

## 2022-01-30 DIAGNOSIS — E119 Type 2 diabetes mellitus without complications: Secondary | ICD-10-CM | POA: Diagnosis not present

## 2022-01-30 DIAGNOSIS — M48061 Spinal stenosis, lumbar region without neurogenic claudication: Secondary | ICD-10-CM | POA: Diagnosis not present

## 2022-01-30 LAB — URINALYSIS, ROUTINE W REFLEX MICROSCOPIC
Bilirubin Urine: NEGATIVE
Glucose, UA: NEGATIVE mg/dL
Ketones, ur: NEGATIVE mg/dL
Nitrite: NEGATIVE
Protein, ur: NEGATIVE mg/dL
Specific Gravity, Urine: 1.018 (ref 1.005–1.030)
pH: 5 (ref 5.0–8.0)

## 2022-01-30 MED ORDER — FENTANYL CITRATE PF 50 MCG/ML IJ SOSY
50.0000 ug | PREFILLED_SYRINGE | Freq: Once | INTRAMUSCULAR | Status: AC
  Administered 2022-01-30: 50 ug via INTRAMUSCULAR
  Filled 2022-01-30: qty 1

## 2022-01-30 NOTE — ED Provider Notes (Signed)
[facility] Provider Note   CSN: [id number] Arrival date & time: 01/30/22  1214     History  Chief Complaint  Patient presents with   Back Pain    Regina Patton is a 51 y.o. female.   Back Pain    Patient is a 51 year old female with medical history notable for diabetes, chronic pain syndrome, fibromyalgia, RA, status post lumbar surgery 3 years ago in [place] presenting today due to low back pain.  She is been dealing with this pain for a few weeks, its in the lumbar region upper back and also associated with bilateral lower extremity cramping pain.  It worsened yesterday.  Yesterday patient states she had 2 episodes of urinary incontinence which has never happened previously.  Denies any fevers, she is ambulatory.  She has been trying Tylenol, ibuprofen, Epsom salts and her home narcotic medicine.  She has an appointment June 1 to follow-up with neurosurgery, she was seen in the ED 01/21/2022 for this and had a negative CT lumbar without contrast.  She denies any saddle anesthesia, bilateral lower extremity weakness, lateralized weakness, headache, history of IV drug use, history of malignancy, dysuria, hematuria, recent falls or injuries.   Denies any dysuria or hematuria, no lateralized symptoms, no fevers, no IV drug use.   Home Medications Prior to Admission medications   Medication Sig Start Date End Date Taking? Authorizing Provider  acetaminophen (TYLENOL) 500 MG tablet Take 500 mg by mouth every 6 (six) hours as needed.    [provider]  cyclobenzaprine (FLEXERIL) 10 MG tablet Take 1 tablet (10 mg total) by mouth 2 (two) times daily as needed for muscle spasms. 05/16/21   Wurst, [place], PA-C  cyclobenzaprine (FLEXERIL) 5 MG tablet Take 1 tablet (5 mg total) by mouth 3 (three) times daily as needed for muscle spasms. Do not drink alcohol or drive while taking this medication.  May cause drowsiness. 09/29/21   Volney American,  PA-C  folic acid (FOLVITE) 1 MG tablet Take 1 tablet (1 mg total) by mouth daily. Patient not taking: Reported on 05/27/2021 01/15/21   Collier Salina, MD  levocetirizine (XYZAL) 5 MG tablet Take 1 tablet (5 mg total) by mouth every evening. 12/16/21   Jaynee Eagles, PA-C  meloxicam (MOBIC) 15 MG tablet Take 1 tablet (15 mg total) by mouth daily. Patient not taking: Reported on 05/27/2021 05/16/21   Wurst, [place], PA-C  methotrexate (RHEUMATREX) 2.5 MG tablet Take 17.5 mg by mouth once a week. Caution:Chemotherapy. Protect from light.    [provider]  pantoprazole (PROTONIX) 20 MG tablet Take 20 mg by mouth daily. Patient not taking: Reported on 05/27/2021 01/02/21   [provider]  predniSONE (DELTASONE) 50 MG tablet Take 1 tablet (50 mg total) by mouth daily with breakfast. 12/16/21   Jaynee Eagles, PA-C  promethazine-dextromethorphan (PROMETHAZINE-DM) 6.25-15 MG/5ML syrup Take 5 mLs by mouth 3 (three) times daily as needed for cough. 12/16/21   Jaynee Eagles, PA-C  tiZANidine (ZANAFLEX) 4 MG tablet Take 1 tablet (4 mg total) by mouth at bedtime. 12/16/21   Jaynee Eagles, PA-C  dicyclomine (BENTYL) 20 MG tablet Take 1 tablet (20 mg total) by mouth 2 (two) times daily. Patient not taking: Reported on 11/26/2015 10/12/15 11/26/15  Orpah Greek, MD  diphenhydrAMINE (BENADRYL) 25 MG tablet Take 1 tablet (25 mg total) by mouth every 4 (four) hours as needed for itching. Patient not taking: Reported on 04/25/2015 04/11/15 05/04/15  Kem Parkinson,  PA-C  hyoscyamine (LEVSIN SL) 0.125 MG SL tablet Place 1 tablet (0.125 mg total) under the tongue every 4 (four) hours as needed. Patient not taking: Reported on 09/26/2015 06/04/15 10/12/15  Annitta Needs, NP  metoCLOPramide (REGLAN) 10 MG tablet Take 1 tablet (10 mg total) by mouth every 8 (eight) hours as needed for nausea or vomiting. Patient not taking: Reported on 11/26/2015 10/13/15 11/26/15  Julianne Rice, MD  ranitidine (ZANTAC) 150 MG  tablet Take 1 tablet (150 mg total) by mouth 2 (two) times daily. Patient not taking: Reported on 11/26/2015 10/12/15 11/26/15  Orpah Greek, MD      Allergies    Mango flavor, Penicillins, Oxycodone, Tramadol, Zofran [ondansetron hcl], Bactrim [sulfamethoxazole-trimethoprim], Keflex [cephalexin], and Toradol [ketorolac tromethamine]    Review of Systems   Review of Systems  Musculoskeletal:  Positive for back pain.   Physical Exam Updated Vital Signs BP (!) 141/94 (BP Location: Right Arm)   Pulse 74   Temp 98.6 F (37 C) (Oral)   Resp 17   Ht 5' (1.524 m)   Wt 81.6 kg   SpO2 99%   BMI 35.15 kg/m  Physical Exam Vitals and nursing note reviewed. Exam conducted with a chaperone present.  Constitutional:      Appearance: Normal appearance.  HENT:     Head: Normocephalic and atraumatic.  Eyes:     General: No scleral icterus.       Right eye: No discharge.        Left eye: No discharge.     Extraocular Movements: Extraocular movements intact.     Pupils: Pupils are equal, round, and reactive to light.  Cardiovascular:     Rate and Rhythm: Normal rate and regular rhythm.     Pulses: Normal pulses.     Heart sounds: Normal heart sounds. No murmur heard.   No friction rub. No gallop.     Comments: DP PT 2+ bilaterally Pulmonary:     Effort: Pulmonary effort is normal. No respiratory distress.     Breath sounds: Normal breath sounds.  Abdominal:     General: Abdomen is flat. Bowel sounds are normal. There is no distension.     Palpations: Abdomen is soft.     Tenderness: There is no abdominal tenderness.     Comments: No CVA tenderness  Musculoskeletal:        General: Tenderness present.     Comments: Pinpoint lumbar tenderness L4-L5.  Surgical scar noted, no overlying cellulitis or dermatomal distribution of rash.  Moves upper and lower extremities symmetrically.  Skin:    General: Skin is warm and dry.     Coloration: Skin is not jaundiced.  Neurological:      Mental Status: She is alert and oriented to person, place, and time. Mental status is at baseline.     Coordination: Coordination normal.     Comments: Cranial nerves III through XII are grossly intact.  Grip strength is equal bilaterally.  Patellar and Achilles reflexes present, slightly reduced but symmetric bilaterally.  Psychiatric:        Mood and Affect: Mood normal.    ED Results / Procedures / Treatments   Labs (all labs ordered are listed, but only abnormal results are displayed) Labs Reviewed  URINALYSIS, ROUTINE W REFLEX MICROSCOPIC - Abnormal; Notable for the following components:      Result Value   APPearance CLOUDY (*)    Hgb urine dipstick SMALL (*)    Leukocytes,Ua SMALL (*)  Bacteria, UA RARE (*)    All other components within normal limits    EKG None  Radiology MR LUMBAR SPINE WO CONTRAST  Result Date: 01/30/2022 CLINICAL DATA:  Provided history: Low back pain, cauda equina syndrome suspected. Additional history provided: Patient reports worsening low back pain radiating down bilateral legs for 2 weeks. Patient reports decreased sensation with urination last night. EXAM: MRI LUMBAR SPINE WITHOUT CONTRAST TECHNIQUE: Multiplanar, multisequence MR imaging of the lumbar spine was performed. No intravenous contrast was administered. COMPARISON:  Lumbar spine CT 01/21/2022. Spine radiographs 04/12/2021. FINDINGS: Segmentation: The caudal most well-formed intervertebral disc space is designated L5-S1. Hypoplastic ribs bilaterally at T12. Alignment: Trace grade 1 retrolisthesis at T12-L1, L1-L2 and L4-L5. 3 mm L4-L5 grade 1 anterolisthesis. 5 mm L5-S1 grade 1 anterolisthesis. Vertebrae: Vertebral body height is maintained. Nondisplaced stress fracture within the right L5 pedicle with surrounding edema (for instance as seen on series 6, image 4). Edema also present within the left L4 and L5 pedicles, and this edema may be degenerative or may reflect stress reaction. Vertebral  body hemangiomas at L2 and L4. Conus medullaris and cauda equina: Conus extends to the T12-L1 level. No signal abnormality within the visualized distal spinal cord. Paraspinal and other soft tissues: Incompletely imaged left renal cyst. No paraspinal mass or collection. Disc levels: Multilevel disc degeneration. Disc degeneration is greatest at T12-L1 and L5-S1 (mild-to-moderate at these levels). T11-T12: This level is imaged in the sagittal plane only. Slight disc bulge. No significant disc herniation or stenosis. T12-L1: This level is imaged in the sagittal plane only. Trace grade 1 retrolisthesis. Shallow disc bulge. No significant spinal canal or foraminal stenosis. L1-L2: Trace grade 1 retrolisthesis. Shallow disc bulge. Minimal facet arthrosis. No significant spinal canal or foraminal stenosis. L2-L3: Trace grade 1 retrolisthesis. Slight disc bulge. No significant spinal canal or foraminal stenosis. L3-L4: Trace grade 1 retrolisthesis. Disc bulge. Moderate facet arthrosis. Mild ligamentum flavum hypertrophy. Mild relative right subarticular narrowing (without nerve root impingement). Central canal patent. inimal relative bilateral neural foraminal narrowing. L4-L5: Prior posterior decompression. 3 mm grade 1 anterolisthesis. Disc uncovering with disc bulge. Advanced facet arthrosis. Bilateral ventrally projecting synovial facet cysts encroach upon the central canal and subarticular recesses. The synovial facet cyst on the right measures 16 x 4 mm. The synovial facet cyst on the left measures 14 x 6 mm. Resultant severe bilateral subarticular and central canal stenosis. Bilateral neural foraminal narrowing (mild to moderate right, moderate left). L5-S1: Prior posterior decompression. 5 mm grade 1 anterolisthesis. Disc uncovering. Posterior annular fissure within the right subarticular zone. Facet arthrosis (advanced right, moderate left). Small right facet joint effusion. Ligamentum flavum hypertrophy. No  significant spinal canal or foraminal stenosis. Findings of severe spinal canal stenosis at L4-L5 discussed with Dr. Melina Copa of the emergency department at 4:30 p.m. on 01/30/2022. IMPRESSION: 1. For the purposes of this dictation, the caudal most well-formed intervertebral disc space is designated L5-S1. Hypoplastic ribs are present at T12, bilaterally. 2. Nondisplaced stress fracture within the right L5 pedicle with surrounding edema. 3. Edema also present within the left L4 and L5 pedicles. This edema may be degenerative or may reflect stress reaction 4. Lumbar spondylosis and postoperative changes, as outlined and with findings most notably as follows. 5. At L4-L5, there has been prior posterior decompression. 3 mm grade 1 anterolisthesis. Disc uncovering with disc bulge. Advanced facet arthrosis. Bilateral ventrally projecting synovial facet cysts encroach upon the central canal and subarticular recesses. Severe bilateral subarticular and central canal  stenosis (predominantly secondary to hypertrophic facet arthrosis and the synovial facet cysts). Bilateral neural foraminal narrowing (mild to moderate right, moderate left). 6. At L5-S1, there has been prior posterior decompression. 5 mm grade 1 anterolisthesis. Disc uncovering. Posterior annular fissure in the right subarticular zone. Facet arthrosis (advanced right, moderate left) with ligamentum flavum hypertrophy. Small right facet joint effusion. No significant spinal canal or foraminal stenosis. Electronically Signed   By: Kellie Simmering D.O.   On: 01/30/2022 16:33    Procedures Procedures    Medications Ordered in ED Medications  fentaNYL (SUBLIMAZE) injection 50 mcg (50 mcg Intramuscular Given 01/30/22 1715)    ED Course/ Medical Decision Making/ A&P                           Medical Decision Making Amount and/or Complexity of Data Reviewed Labs: ordered. Radiology: ordered.  Risk Prescription drug management.   Patient presents with  low back pain.  Differential diagnosis includes but not limited to chronic lumbar pain, lumbar stenosis, cauda equina, epidural abscess, compression fracture.  On physical exam patient has no focal deficits.  Lower extremity reflexes are symmetric bilaterally although slightly reduced.  She is neurovascularly intact with PT and DP 2+.  She is ambulatory, there is pinpoint reproducible tenderness to L4-L5.  She has not had any loss of bladder or bowel function today.  Also no loss of bowel function yesterday.  Could be stress incontinence given the small volume she reported. Given she does have history of back surgery and this episode of urinary incontinence we will proceed with MRI lumbar to further evaluate.  UA with small amount of hematuria and trace leukocytes.  Given no dysuria will not proceed with antibiotics at this time.  I ordered, viewed the MRI of the lumbar spine without contrast.  Agree with radiologist interpretation.  Patient has significant lumbar stenosis, she also has benign cyst.  There is no evidence of epidural abscess.  She does have a nondisplaced stress fracture at L5 pedicle with surrounding edema which is also noted at L4 and L5.  Degenerative versus reflect stress reaction.  There is lumbar spondylosis, there are also bilateral ventral projecting synovial facet cyst encroaching on the central canal.  Severe bilateral subarticular and central canal stenosis noted, L5 and S1 with prior posterior decompression, there is facet arthrosis with ligamentum flavum hypertrophy.  I consulted with neurosurgery regarding the MRI findings.  I spoke with Dr. Venetia Constable, described physical exam as documented above as well as her history.  He reviewed the MRI imaging remotely, does not feel patient needs emergent neurosurgical evaluation at this time.  Given that there has been no loss of bowel or bladder function today and that there was no loss of bowel function yesterday.  Additionally the  absence of current focal deficits in the presence of reflexes is reassuring.  He states patient would benefit from outpatient follow-up with neurosurgery in the next week or so and he can see her in the office. States seeing her doctor on 02/05/22 is appropriate follow up window.  I appreciate his consultation.  Patient's pain is improved with the fentanyl.  Discussed the work-up results with her as well as recommendations from neurosurgery.  Patient will see her doctor on 1 June, encouraged to continue taking ibuprofen and Tylenol.  She states she did not want to take either because she was worried about her liver function and I informed her about safe dosing.  Considered discharging with narcotics but after a chronic database review she should still have narcotic medicine.  She is also followed by chronic pain and I do not want to violate her pain contract.  Encouraged her to follow-up with her PCP or pain management doctor for narcotics if indicated.  We discussed tricked return precautions, she was discharged in stable condition.   01/23/2022  01/22/2022   2  Hydrocodone-Acetamin 5-325 Mg 8.00  2  Aa Par  [id number]   Car ([id number])  0/0  20.00 MME  Medicare  [place]    01/19/2022  01/19/2022   2  Hydrocodone-Acetamin 5-325 Mg 15.00  4  Aa Par  [id number]   Car ([id number])  0/0  18.75 MME  Medicare  [place]    01/07/2022  01/07/2022   2  Acetaminophen-Cod #3 Tablet 30.00  30  Cherly Hensen  [id number]   Car [phone])  0/0  4.50 MME  Medicare  [place]    Case discussed with my attending Dr. Melina Copa who agrees with this plan.        Final Clinical Impression(s) / ED Diagnoses Final diagnoses:  Spinal stenosis of lumbar region without neurogenic claudication    Rx / DC Orders ED Discharge Orders     None         Sherrill Raring, PA-C 01/30/22 1807    Hayden Rasmussen, MD 01/31/22 1146

## 2022-01-30 NOTE — Discharge Instructions (Addendum)
Follow-up with neurosurgery on June 1 as planned.  Return to the ED if you have numbness in your groin, inability to walk, loss of control of your bowels or bladder again, fever, new or concerning symptoms.  Your MRI today was abnormal and showed some narrowing of the spinal column.  See MyChart for the full read.  Take ibuprofen and Tylenol for pain at home.  Patient the chart review you should still have some narcotic pain medicine left over, talk to your primary care doctor about getting refills.   01/23/2022  01/22/2022   2  Hydrocodone-Acetamin 5-325 Mg 8.00  2  Aa Par  [id number]   Car ([id number])  0/0  20.00 MME  Medicare  [place]    01/19/2022  01/19/2022   2  Hydrocodone-Acetamin 5-325 Mg 15.00  4  Aa Par  [id number]   Car ([id number])  0/0  18.75 MME  Medicare  [place]    01/07/2022  01/07/2022   2  Acetaminophen-Cod #3 Tablet 30.00  30  Cherly Hensen  [id number]   Car ([id number])  0/0  4.50 MME  Medicare  

## 2022-01-30 NOTE — ED Triage Notes (Addendum)
Pt c/o worsening low back pain radiating down bilateral legs x2 weeks.  Pt reports decrease sensation w/ urination last night.  Pain score 9/10.  Pt reports having an appointment w/ neurology next week.

## 2022-01-30 NOTE — ED Provider Triage Note (Signed)
Emergency Medicine Provider Triage Evaluation Note  Regina Patton , a 51 y.o. female  was evaluated in triage.  Pt complains of low back pain.  She has had previous lumbar surgery, has an appointment with neurosurgery on June 1.  States the pain is in the lumbar spine, also has cramping pain on the top of her thighs bilaterally.  States she had 2 episodes yesterday where she urinated herself.  She was seen here in the ED a few weeks ago, CT lumbar without contrast showed some narrowing but no acute findings.  Denies any dysuria or hematuria, no lateralized symptoms, no fevers, no IV drug use..  Review of Systems  Per HPI  Physical Exam  BP (!) 138/109 (BP Location: Right Arm)   Pulse 87   Temp 98.6 F (37 C) (Oral)   Resp 16   Ht 5' (1.524 m)   Wt 81.6 kg   SpO2 100%   BMI 35.15 kg/m  Gen:   Awake, no distress   Resp:  Normal effort  MSK:   Moves extremities without difficulty  Other:  Midline lumbar tenderness at L4.  Cranial nerves III through XII are grossly intact, grips and equal bilaterally.  Ambulatory, patellar reflexes symmetric bilaterally and present.  Medical Decision Making  Medically screening exam initiated at 1:04 PM.  Appropriate orders placed.  Adyson I Lanz was informed that the remainder of the evaluation will be completed by another provider, this initial triage assessment does not replace that evaluation, and the importance of remaining in the ED until their evaluation is complete.  There are no focal neurodeficits on exam.  Her vitals are stable.     Sherrill Raring, PA-C 01/30/22 1308

## 2022-02-05 ENCOUNTER — Other Ambulatory Visit: Payer: Self-pay | Admitting: Neurological Surgery

## 2022-02-06 ENCOUNTER — Encounter (HOSPITAL_COMMUNITY): Payer: Self-pay | Admitting: Neurological Surgery

## 2022-02-06 ENCOUNTER — Other Ambulatory Visit: Payer: Self-pay

## 2022-02-08 NOTE — Anesthesia Preprocedure Evaluation (Addendum)
Anesthesia Evaluation  Patient identified by MRN, date of birth, ID band Patient awake    Reviewed: Allergy & Precautions, NPO status , Patient's Chart, lab work & pertinent test results  Airway Mallampati: III  TM Distance: >3 FB Neck ROM: Full    Dental  (+) Poor Dentition, Dental Advisory Given, Edentulous Upper, Edentulous Lower   Pulmonary pneumonia,    Pulmonary exam normal breath sounds clear to auscultation       Cardiovascular hypertension, Pt. on medications Normal cardiovascular exam Rhythm:Regular Rate:Normal     Neuro/Psych PSYCHIATRIC DISORDERS Depression  Neuromuscular disease    GI/Hepatic GERD  ,  Endo/Other  diabetes  Renal/GU      Musculoskeletal  (+) Arthritis , Osteoarthritis,  Fibromyalgia -  Abdominal (+) + obese,   Peds  Hematology   Anesthesia Other Findings   Reproductive/Obstetrics                            Anesthesia Physical  Anesthesia Plan  ASA: 3  Anesthesia Plan: General   Post-op Pain Management: Ketamine IV*, Gabapentin PO (pre-op)* and Tylenol PO (pre-op)*   Induction: Intravenous  PONV Risk Score and Plan: 4 or greater and Propofol infusion, Midazolam, Dexamethasone, Treatment may vary due to age or medical condition and Ondansetron  Airway Management Planned: Oral ETT and Video Laryngoscope Planned  Additional Equipment: None  Intra-op Plan:   Post-operative Plan: Extubation in OR  Informed Consent: I have reviewed the patients History and Physical, chart, labs and discussed the procedure including the risks, benefits and alternatives for the proposed anesthesia with the patient or authorized representative who has indicated his/her understanding and acceptance.     Dental advisory given  Plan Discussed with: CRNA  Anesthesia Plan Comments:       Anesthesia Quick Evaluation

## 2022-02-09 ENCOUNTER — Inpatient Hospital Stay (HOSPITAL_COMMUNITY)
Admission: RE | Admit: 2022-02-09 | Discharge: 2022-02-11 | DRG: 455 | Disposition: A | Discharge status: Home / Self Care | Payer: Medicare Other | Attending: Neurological Surgery | Admitting: Neurological Surgery

## 2022-02-09 ENCOUNTER — Other Ambulatory Visit: Payer: Self-pay

## 2022-02-09 ENCOUNTER — Encounter (HOSPITAL_COMMUNITY)
Admission: RE | Disposition: A | Discharge status: Home / Self Care | Payer: Self-pay | Source: Home / Self Care | Attending: Neurological Surgery

## 2022-02-09 ENCOUNTER — Inpatient Hospital Stay (HOSPITAL_COMMUNITY): Payer: Medicare Other | Admitting: Anesthesiology

## 2022-02-09 ENCOUNTER — Encounter (HOSPITAL_COMMUNITY): Payer: Self-pay | Admitting: Neurological Surgery

## 2022-02-09 ENCOUNTER — Inpatient Hospital Stay (HOSPITAL_COMMUNITY): Payer: Medicare Other

## 2022-02-09 DIAGNOSIS — M4316 Spondylolisthesis, lumbar region: Principal | ICD-10-CM | POA: Diagnosis present

## 2022-02-09 DIAGNOSIS — Z981 Arthrodesis status: Secondary | ICD-10-CM | POA: Diagnosis present

## 2022-02-09 DIAGNOSIS — M48061 Spinal stenosis, lumbar region without neurogenic claudication: Secondary | ICD-10-CM | POA: Diagnosis not present

## 2022-02-09 DIAGNOSIS — M069 Rheumatoid arthritis, unspecified: Secondary | ICD-10-CM | POA: Diagnosis present

## 2022-02-09 DIAGNOSIS — M961 Postlaminectomy syndrome, not elsewhere classified: Secondary | ICD-10-CM | POA: Diagnosis not present

## 2022-02-09 DIAGNOSIS — Z800 Family history of malignant neoplasm of digestive organs: Secondary | ICD-10-CM | POA: Diagnosis not present

## 2022-02-09 DIAGNOSIS — G8929 Other chronic pain: Secondary | ICD-10-CM | POA: Diagnosis present

## 2022-02-09 DIAGNOSIS — M4317 Spondylolisthesis, lumbosacral region: Secondary | ICD-10-CM | POA: Diagnosis present

## 2022-02-09 DIAGNOSIS — I10 Essential (primary) hypertension: Secondary | ICD-10-CM | POA: Diagnosis present

## 2022-02-09 DIAGNOSIS — M7138 Other bursal cyst, other site: Secondary | ICD-10-CM

## 2022-02-09 DIAGNOSIS — Z6834 Body mass index (BMI) 34.0-34.9, adult: Secondary | ICD-10-CM

## 2022-02-09 DIAGNOSIS — E669 Obesity, unspecified: Secondary | ICD-10-CM | POA: Diagnosis present

## 2022-02-09 DIAGNOSIS — K219 Gastro-esophageal reflux disease without esophagitis: Secondary | ICD-10-CM | POA: Diagnosis present

## 2022-02-09 DIAGNOSIS — M797 Fibromyalgia: Secondary | ICD-10-CM | POA: Diagnosis present

## 2022-02-09 DIAGNOSIS — R7303 Prediabetes: Secondary | ICD-10-CM | POA: Diagnosis present

## 2022-02-09 DIAGNOSIS — Z833 Family history of diabetes mellitus: Secondary | ICD-10-CM | POA: Diagnosis not present

## 2022-02-09 DIAGNOSIS — Z832 Family history of diseases of the blood and blood-forming organs and certain disorders involving the immune mechanism: Secondary | ICD-10-CM

## 2022-02-09 DIAGNOSIS — Z8616 Personal history of COVID-19: Secondary | ICD-10-CM | POA: Diagnosis not present

## 2022-02-09 HISTORY — DX: Prediabetes: R73.03

## 2022-02-09 HISTORY — DX: Depression, unspecified: F32.A

## 2022-02-09 HISTORY — DX: Pneumonia, unspecified organism: J18.9

## 2022-02-09 HISTORY — DX: Gastro-esophageal reflux disease without esophagitis: K21.9

## 2022-02-09 LAB — BASIC METABOLIC PANEL
Anion gap: 7 (ref 5–15)
BUN: 7 mg/dL (ref 6–20)
CO2: 24 mmol/L (ref 22–32)
Calcium: 9 mg/dL (ref 8.9–10.3)
Chloride: 106 mmol/L (ref 98–111)
Creatinine, Ser: 0.76 mg/dL (ref 0.44–1.00)
GFR, Estimated: >60 mL/min (ref >60)
Glucose, Bld: 93 mg/dL (ref 70–99)
Potassium: 3.5 mmol/L (ref 3.5–5.1)
Sodium: 137 mmol/L (ref 135–145)

## 2022-02-09 LAB — ABO/RH: ABO/RH(D): O POS

## 2022-02-09 LAB — PROTIME-INR
INR: 0.9 (ref 0.8–1.2)
Prothrombin Time: 12.3 seconds (ref 11.4–15.2)

## 2022-02-09 LAB — CBC
HCT: 42.1 % (ref 36.0–46.0)
Hemoglobin: 15 g/dL (ref 12.0–15.0)
MCH: 33.9 pg (ref 26.0–34.0)
MCHC: 35.6 g/dL (ref 30.0–36.0)
MCV: 95 fL (ref 80.0–100.0)
Platelets: 204 10*3/uL (ref 150–400)
RBC: 4.43 MIL/uL (ref 3.87–5.11)
RDW: 13.9 % (ref 11.5–15.5)
WBC: 5.3 10*3/uL (ref 4.0–10.5)
nRBC: 0 % (ref 0.0–0.2)

## 2022-02-09 LAB — SURGICAL PCR SCREEN
MRSA, PCR: NEGATIVE
Staphylococcus aureus: NEGATIVE

## 2022-02-09 LAB — TYPE AND SCREEN
ABO/RH(D): O POS
Antibody Screen: NEGATIVE

## 2022-02-09 LAB — POCT PREGNANCY, URINE: Preg Test, Ur: NEGATIVE

## 2022-02-09 SURGERY — POSTERIOR LUMBAR FUSION 2 LEVEL
Anesthesia: General | Site: Back

## 2022-02-09 MED ORDER — KETAMINE HCL 50 MG/5ML IJ SOSY
PREFILLED_SYRINGE | INTRAMUSCULAR | Status: AC
  Filled 2022-02-09: qty 5

## 2022-02-09 MED ORDER — PHENOL 1.4 % MT LIQD
1.0000 | OROMUCOSAL | Status: DC | PRN
Start: 2022-02-09 — End: 2022-02-11

## 2022-02-09 MED ORDER — AMISULPRIDE (ANTIEMETIC) 5 MG/2ML IV SOLN: 10.0000 mg | Freq: Once | INTRAVENOUS | Status: DC | PRN

## 2022-02-09 MED ORDER — BUPIVACAINE HCL (PF) 0.25 % IJ SOLN
INTRAMUSCULAR | Status: DC | PRN
  Administered 2022-02-09: 6 mL

## 2022-02-09 MED ORDER — MIDAZOLAM HCL 2 MG/2ML IJ SOLN
INTRAMUSCULAR | Status: AC
  Filled 2022-02-09: qty 2

## 2022-02-09 MED ORDER — PHENYLEPHRINE HCL-NACL 20-0.9 MG/250ML-% IV SOLN
INTRAVENOUS | Status: DC | PRN
  Administered 2022-02-09: 50 ug/min via INTRAVENOUS

## 2022-02-09 MED ORDER — THROMBIN 20000 UNITS EX SOLR
CUTANEOUS | Status: DC | PRN
  Administered 2022-02-09: 20 mL via TOPICAL

## 2022-02-09 MED ORDER — POTASSIUM CHLORIDE IN NACL 20-0.9 MEQ/L-% IV SOLN: INTRAVENOUS | Status: DC

## 2022-02-09 MED ORDER — PHENYLEPHRINE 80 MCG/ML (10ML) SYRINGE FOR IV PUSH (FOR BLOOD PRESSURE SUPPORT)
PREFILLED_SYRINGE | INTRAVENOUS | Status: DC | PRN
  Administered 2022-02-09: 160 ug via INTRAVENOUS
  Administered 2022-02-09: 80 ug via INTRAVENOUS
  Administered 2022-02-09: 160 ug via INTRAVENOUS

## 2022-02-09 MED ORDER — LIDOCAINE 2% (20 MG/ML) 5 ML SYRINGE
INTRAMUSCULAR | Status: AC
  Filled 2022-02-09: qty 5

## 2022-02-09 MED ORDER — LACTATED RINGERS IV SOLN: INTRAVENOUS | Status: DC | PRN

## 2022-02-09 MED ORDER — ACETAMINOPHEN 500 MG PO TABS
1000.0000 mg | ORAL_TABLET | ORAL | Status: AC
  Administered 2022-02-09: 1000 mg via ORAL
  Filled 2022-02-09: qty 2

## 2022-02-09 MED ORDER — ROCURONIUM BROMIDE 10 MG/ML (PF) SYRINGE
PREFILLED_SYRINGE | INTRAVENOUS | Status: AC
  Filled 2022-02-09: qty 10

## 2022-02-09 MED ORDER — CHLORHEXIDINE GLUCONATE 0.12 % MT SOLN
15.0000 mL | Freq: Once | OROMUCOSAL | Status: AC
  Administered 2022-02-09: 15 mL via OROMUCOSAL
  Filled 2022-02-09: qty 15

## 2022-02-09 MED ORDER — VANCOMYCIN HCL IN DEXTROSE 1-5 GM/200ML-% IV SOLN
1000.0000 mg | Freq: Once | INTRAVENOUS | Status: AC
  Administered 2022-02-09: 1000 mg via INTRAVENOUS
  Filled 2022-02-09: qty 200

## 2022-02-09 MED ORDER — VANCOMYCIN HCL IN DEXTROSE 1-5 GM/200ML-% IV SOLN
1000.0000 mg | INTRAVENOUS | Status: AC
  Administered 2022-02-09: 1000 mg via INTRAVENOUS
  Filled 2022-02-09: qty 200

## 2022-02-09 MED ORDER — THROMBIN 5000 UNITS EX SOLR
CUTANEOUS | Status: AC
  Filled 2022-02-09: qty 5000

## 2022-02-09 MED ORDER — BUPIVACAINE HCL (PF) 0.25 % IJ SOLN
INTRAMUSCULAR | Status: AC
  Filled 2022-02-09: qty 30

## 2022-02-09 MED ORDER — HYDROMORPHONE HCL 1 MG/ML IJ SOLN
0.2500 mg | INTRAMUSCULAR | Status: DC | PRN
  Administered 2022-02-09: 0.25 mg via INTRAVENOUS
  Administered 2022-02-09: 0.5 mg via INTRAVENOUS
  Administered 2022-02-09 (×3): 0.25 mg via INTRAVENOUS

## 2022-02-09 MED ORDER — HYDROMORPHONE HCL 1 MG/ML IJ SOLN
INTRAMUSCULAR | Status: AC
  Filled 2022-02-09: qty 1

## 2022-02-09 MED ORDER — ACETAMINOPHEN 500 MG PO TABS
1000.0000 mg | ORAL_TABLET | Freq: Four times a day (QID) | ORAL | Status: AC
  Administered 2022-02-09 – 2022-02-10 (×3): 1000 mg via ORAL
  Filled 2022-02-09 (×4): qty 2

## 2022-02-09 MED ORDER — GLYCOPYRROLATE PF 0.2 MG/ML IJ SOSY
PREFILLED_SYRINGE | INTRAMUSCULAR | Status: DC | PRN
  Administered 2022-02-09: .1 mg via INTRAVENOUS

## 2022-02-09 MED ORDER — 0.9 % SODIUM CHLORIDE (POUR BTL) OPTIME
TOPICAL | Status: DC | PRN
  Administered 2022-02-09: 1000 mL

## 2022-02-09 MED ORDER — CHLORHEXIDINE GLUCONATE CLOTH 2 % EX PADS: 6.0000 | MEDICATED_PAD | Freq: Once | CUTANEOUS | Status: DC

## 2022-02-09 MED ORDER — PROPOFOL 10 MG/ML IV BOLUS
INTRAVENOUS | Status: AC
  Filled 2022-02-09: qty 20

## 2022-02-09 MED ORDER — SODIUM CHLORIDE 0.9% FLUSH
3.0000 mL | Freq: Two times a day (BID) | INTRAVENOUS | Status: DC
  Administered 2022-02-09: 3 mL via INTRAVENOUS

## 2022-02-09 MED ORDER — FENTANYL CITRATE (PF) 250 MCG/5ML IJ SOLN
INTRAMUSCULAR | Status: DC | PRN
  Administered 2022-02-09: 25 ug via INTRAVENOUS
  Administered 2022-02-09: 50 ug via INTRAVENOUS
  Administered 2022-02-09: 100 ug via INTRAVENOUS
  Administered 2022-02-09 (×3): 25 ug via INTRAVENOUS

## 2022-02-09 MED ORDER — METHOCARBAMOL 1000 MG/10ML IJ SOLN: 500.0000 mg | Freq: Four times a day (QID) | INTRAVENOUS | Status: DC | PRN

## 2022-02-09 MED ORDER — HYDROCODONE-ACETAMINOPHEN 10-325 MG PO TABS
1.0000 | ORAL_TABLET | ORAL | Status: DC | PRN
  Administered 2022-02-09 – 2022-02-11 (×11): 1 via ORAL
  Filled 2022-02-09 (×11): qty 1

## 2022-02-09 MED ORDER — KETAMINE HCL 10 MG/ML IJ SOLN
INTRAMUSCULAR | Status: DC | PRN
  Administered 2022-02-09: 10 mg via INTRAVENOUS
  Administered 2022-02-09: 30 mg via INTRAVENOUS

## 2022-02-09 MED ORDER — ORAL CARE MOUTH RINSE: 15.0000 mL | Freq: Once | OROMUCOSAL | Status: AC

## 2022-02-09 MED ORDER — MORPHINE SULFATE (PF) 2 MG/ML IV SOLN
2.0000 mg | INTRAVENOUS | Status: DC | PRN
  Administered 2022-02-09 – 2022-02-10 (×2): 2 mg via INTRAVENOUS
  Filled 2022-02-09 (×2): qty 1

## 2022-02-09 MED ORDER — DEXAMETHASONE SODIUM PHOSPHATE 10 MG/ML IJ SOLN
INTRAMUSCULAR | Status: DC | PRN
  Administered 2022-02-09: 10 mg via INTRAVENOUS

## 2022-02-09 MED ORDER — PROPOFOL 10 MG/ML IV BOLUS
INTRAVENOUS | Status: DC | PRN
  Administered 2022-02-09: 120 mg via INTRAVENOUS

## 2022-02-09 MED ORDER — SODIUM CHLORIDE 0.9% FLUSH
3.0000 mL | INTRAVENOUS | Status: DC | PRN
Start: 2022-02-09 — End: 2022-02-10

## 2022-02-09 MED ORDER — ONDANSETRON HCL 4 MG/2ML IJ SOLN
INTRAMUSCULAR | Status: DC | PRN
  Administered 2022-02-09: 4 mg via INTRAVENOUS

## 2022-02-09 MED ORDER — ADULT MULTIVITAMIN W/MINERALS CH
1.0000 | ORAL_TABLET | Freq: Every day | ORAL | Status: DC
  Administered 2022-02-09 – 2022-02-11 (×3): 1 via ORAL
  Filled 2022-02-09 (×3): qty 1

## 2022-02-09 MED ORDER — LIDOCAINE 2% (20 MG/ML) 5 ML SYRINGE
INTRAMUSCULAR | Status: DC | PRN
  Administered 2022-02-09: 90 mg via INTRAVENOUS

## 2022-02-09 MED ORDER — DEXAMETHASONE SODIUM PHOSPHATE 4 MG/ML IJ SOLN: 4.0000 mg | Freq: Four times a day (QID) | INTRAMUSCULAR | Status: DC

## 2022-02-09 MED ORDER — PHENYLEPHRINE 80 MCG/ML (10ML) SYRINGE FOR IV PUSH (FOR BLOOD PRESSURE SUPPORT)
PREFILLED_SYRINGE | INTRAVENOUS | Status: AC
  Filled 2022-02-09: qty 10

## 2022-02-09 MED ORDER — LACTATED RINGERS IV SOLN: INTRAVENOUS | Status: DC

## 2022-02-09 MED ORDER — MIDAZOLAM HCL 2 MG/2ML IJ SOLN
INTRAMUSCULAR | Status: DC | PRN
  Administered 2022-02-09: 2 mg via INTRAVENOUS

## 2022-02-09 MED ORDER — DEXAMETHASONE SODIUM PHOSPHATE 10 MG/ML IJ SOLN
INTRAMUSCULAR | Status: AC
  Filled 2022-02-09: qty 1

## 2022-02-09 MED ORDER — SENNA 8.6 MG PO TABS
1.0000 | ORAL_TABLET | Freq: Two times a day (BID) | ORAL | Status: DC
  Administered 2022-02-09 – 2022-02-11 (×5): 8.6 mg via ORAL
  Filled 2022-02-09 (×5): qty 1

## 2022-02-09 MED ORDER — SODIUM CHLORIDE 0.9 % IV SOLN: 250.0000 mL | INTRAVENOUS | Status: DC

## 2022-02-09 MED ORDER — ROCURONIUM BROMIDE 10 MG/ML (PF) SYRINGE
PREFILLED_SYRINGE | INTRAVENOUS | Status: DC | PRN
  Administered 2022-02-09: 20 mg via INTRAVENOUS
  Administered 2022-02-09: 30 mg via INTRAVENOUS
  Administered 2022-02-09: 50 mg via INTRAVENOUS
  Administered 2022-02-09: 10 mg via INTRAVENOUS

## 2022-02-09 MED ORDER — MEPERIDINE HCL 25 MG/ML IJ SOLN: 6.2500 mg | INTRAMUSCULAR | Status: DC | PRN

## 2022-02-09 MED ORDER — ONDANSETRON HCL 4 MG/2ML IJ SOLN
INTRAMUSCULAR | Status: AC
  Filled 2022-02-09: qty 2

## 2022-02-09 MED ORDER — MENTHOL 3 MG MT LOZG: 1.0000 | LOZENGE | OROMUCOSAL | Status: DC | PRN

## 2022-02-09 MED ORDER — THROMBIN 5000 UNITS EX SOLR
OROMUCOSAL | Status: DC | PRN
  Administered 2022-02-09: 5 mL via TOPICAL

## 2022-02-09 MED ORDER — THROMBIN 20000 UNITS EX SOLR
CUTANEOUS | Status: AC
  Filled 2022-02-09: qty 20000

## 2022-02-09 MED ORDER — SUGAMMADEX SODIUM 200 MG/2ML IV SOLN
INTRAVENOUS | Status: DC | PRN
  Administered 2022-02-09: 200 mg via INTRAVENOUS

## 2022-02-09 MED ORDER — DEXAMETHASONE 4 MG PO TABS
4.0000 mg | ORAL_TABLET | Freq: Four times a day (QID) | ORAL | Status: DC
  Administered 2022-02-09 – 2022-02-11 (×8): 4 mg via ORAL
  Filled 2022-02-09 (×8): qty 1

## 2022-02-09 MED ORDER — METHOCARBAMOL 500 MG PO TABS
500.0000 mg | ORAL_TABLET | Freq: Four times a day (QID) | ORAL | Status: DC | PRN
  Administered 2022-02-09 – 2022-02-11 (×7): 500 mg via ORAL
  Filled 2022-02-09 (×7): qty 1

## 2022-02-09 MED ORDER — PANTOPRAZOLE SODIUM 40 MG IV SOLR
40.0000 mg | Freq: Every day | INTRAVENOUS | Status: DC
  Administered 2022-02-09: 40 mg via INTRAVENOUS
  Filled 2022-02-09: qty 10

## 2022-02-09 MED ORDER — FENTANYL CITRATE (PF) 250 MCG/5ML IJ SOLN
INTRAMUSCULAR | Status: AC
  Filled 2022-02-09: qty 5

## 2022-02-09 MED ORDER — GABAPENTIN 300 MG PO CAPS
300.0000 mg | ORAL_CAPSULE | ORAL | Status: AC
  Administered 2022-02-09: 300 mg via ORAL
  Filled 2022-02-09: qty 1

## 2022-02-09 SURGICAL SUPPLY — 71 items
BAG COUNTER SPONGE SURGICOUNT (BAG) ×3 IMPLANT
BAG SPNG CNTER NS LX DISP (BAG) ×2
BASKET BONE COLLECTION (BASKET) ×2 IMPLANT
BENZOIN TINCTURE PRP APPL 2/3 (GAUZE/BANDAGES/DRESSINGS) ×2 IMPLANT
BLADE BONE MILL MEDIUM (MISCELLANEOUS) ×1 IMPLANT
BLADE CLIPPER SURG (BLADE) IMPLANT
BUR CARBIDE MATCH 3.0 (BURR) ×2 IMPLANT
CANISTER SUCT 3000ML PPV (MISCELLANEOUS) ×2 IMPLANT
CNTNR URN SCR LID CUP LEK RST (MISCELLANEOUS) ×1 IMPLANT
CONT SPEC 4OZ STRL OR WHT (MISCELLANEOUS) ×2
COVER BACK TABLE 60X90IN (DRAPES) ×2 IMPLANT
DERMABOND ADVANCED (GAUZE/BANDAGES/DRESSINGS) ×1
DERMABOND ADVANCED .7 DNX12 (GAUZE/BANDAGES/DRESSINGS) ×1 IMPLANT
DRAPE C-ARM 42X72 X-RAY (DRAPES) ×4 IMPLANT
DRAPE C-ARMOR (DRAPES) ×1 IMPLANT
DRAPE LAPAROTOMY 100X72X124 (DRAPES) ×2 IMPLANT
DRAPE SURG 17X23 STRL (DRAPES) ×2 IMPLANT
DRSG OPSITE POSTOP 4X6 (GAUZE/BANDAGES/DRESSINGS) ×1 IMPLANT
DURAPREP 26ML APPLICATOR (WOUND CARE) ×2 IMPLANT
ELECT REM PT RETURN 9FT ADLT (ELECTROSURGICAL) ×2
ELECTRODE REM PT RTRN 9FT ADLT (ELECTROSURGICAL) ×1 IMPLANT
EVACUATOR 1/8 PVC DRAIN (DRAIN) ×1 IMPLANT
GAUZE 4X4 16PLY ~~LOC~~+RFID DBL (SPONGE) IMPLANT
GLOVE BIO SURGEON STRL SZ7 (GLOVE) ×1 IMPLANT
GLOVE BIO SURGEON STRL SZ8 (GLOVE) ×4 IMPLANT
GLOVE BIOGEL PI IND STRL 7.0 (GLOVE) IMPLANT
GLOVE BIOGEL PI IND STRL 7.5 (GLOVE) IMPLANT
GLOVE BIOGEL PI INDICATOR 7.0 (GLOVE) ×1
GLOVE BIOGEL PI INDICATOR 7.5 (GLOVE) ×3
GLOVE ECLIPSE 7.0 STRL STRAW (GLOVE) ×3 IMPLANT
GOWN STRL REUS W/ TWL LRG LVL3 (GOWN DISPOSABLE) IMPLANT
GOWN STRL REUS W/ TWL XL LVL3 (GOWN DISPOSABLE) ×2 IMPLANT
GOWN STRL REUS W/TWL 2XL LVL3 (GOWN DISPOSABLE) IMPLANT
GOWN STRL REUS W/TWL LRG LVL3 (GOWN DISPOSABLE) ×2
GOWN STRL REUS W/TWL XL LVL3 (GOWN DISPOSABLE) ×6
GRAFT BONE PROTEIOS XL 10CC (Orthopedic Implant) ×1 IMPLANT
HEMOSTAT POWDER KIT SURGIFOAM (HEMOSTASIS) ×2 IMPLANT
KIT BASIN OR (CUSTOM PROCEDURE TRAY) ×2 IMPLANT
KIT GRAFTMAG DEL NEURO DISP (NEUROSURGERY SUPPLIES) IMPLANT
KIT TURNOVER KIT B (KITS) ×2 IMPLANT
MATRIX SPINE STRIP NEOCORE 5CC (Putty) IMPLANT
MILL BONE PREP (MISCELLANEOUS) ×1 IMPLANT
NDL HYPO 25X1 1.5 SAFETY (NEEDLE) ×1 IMPLANT
NEEDLE HYPO 25X1 1.5 SAFETY (NEEDLE) ×2 IMPLANT
NS IRRIG 1000ML POUR BTL (IV SOLUTION) ×2 IMPLANT
PACK LAMINECTOMY NEURO (CUSTOM PROCEDURE TRAY) ×2 IMPLANT
PAD ARMBOARD 7.5X6 YLW CONV (MISCELLANEOUS) ×6 IMPLANT
ROD LORD LIPPED TI 5.5X60 (Rod) ×1 IMPLANT
ROD LORD LIPPED TI 5.5X65 (Rod) ×1 IMPLANT
SCREW ILIAC PA 5.5X40 (Screw) ×2 IMPLANT
SCREW KODIAK 6.5X40 (Screw) ×2 IMPLANT
SCREW POLYAXIAL TULIP (Screw) ×2 IMPLANT
SCREW SHANK MOD 5.5X40 (Screw) ×2 IMPLANT
SET SCREW (Screw) ×12 IMPLANT
SET SCREW SPNE (Screw) IMPLANT
SPACER IDENTITI PS 10X9X25 10D (Spacer) ×2 IMPLANT
SPACER IDENTITI PS 9X9X25 10D (Spacer) ×2 IMPLANT
SPONGE SURGIFOAM ABS GEL 100 (HEMOSTASIS) ×2 IMPLANT
SPONGE T-LAP 4X18 ~~LOC~~+RFID (SPONGE) IMPLANT
STRIP CLOSURE SKIN 1/2X4 (GAUZE/BANDAGES/DRESSINGS) ×3 IMPLANT
STRIP MATRIX NEOCORE 5CC (Putty) ×2 IMPLANT
SUT VIC AB 0 CT1 18XCR BRD8 (SUTURE) ×1 IMPLANT
SUT VIC AB 0 CT1 8-18 (SUTURE) ×2
SUT VIC AB 2-0 CP2 18 (SUTURE) ×2 IMPLANT
SUT VIC AB 3-0 SH 8-18 (SUTURE) ×3 IMPLANT
SYR CONTROL 10ML LL (SYRINGE) ×2 IMPLANT
TOWEL GREEN STERILE (TOWEL DISPOSABLE) ×2 IMPLANT
TOWEL GREEN STERILE FF (TOWEL DISPOSABLE) ×2 IMPLANT
TRAY FOLEY MTR SLVR 14FR STAT (SET/KITS/TRAYS/PACK) ×1 IMPLANT
TRAY FOLEY MTR SLVR 16FR STAT (SET/KITS/TRAYS/PACK) ×1 IMPLANT
WATER STERILE IRR 1000ML POUR (IV SOLUTION) ×2 IMPLANT

## 2022-02-09 NOTE — H&P (Signed)
Subjective: Patient is a 51 y.o. female admitted for PLIF. Onset of symptoms was several months ago, gradually worsening since that time.  The pain is rated severe, and is located at the across the lower back and radiates to legs, and she is having difficulty controlling her bowel and bladder requiring Depends. The pain is described as aching and occurs all day. The symptoms have been progressive. Symptoms are exacerbated by exercise, standing, and walking for more than a few minutes. MRI or CT showed recurrent stenosis with synovial cysts L4-5 L5-S1 where she had previous surgery   Past Medical History:  Diagnosis Date   C. difficile diarrhea    Cervical radiculopathy    Chronic abdominal pain    Chronic back pain    Chronic knee pain    Chronic neck pain    COVID 11/2021   mild   Depression    Fibromyalgia    GERD (gastroesophageal reflux disease)    Pneumonia    Pre-diabetes    Rheumatoid arthritis (HCC)    "Rhematoid factor positive but no symptoms" per EPIC notes 2012   Rib pain on right side    chronic   Sciatic pain    right    Past Surgical History:  Procedure Laterality Date   BACK SURGERY     BLADDER SURGERY     CHOLECYSTECTOMY N/A 10/18/2015   Procedure: LAPAROSCOPIC CHOLECYSTECTOMY;  Surgeon: Franky Macho, MD;  Location: [facility];  Service: General;  Laterality: N/A;   TUBAL LIGATION     URETHRAL DIVERTICULECTOMY      Prior to Admission medications   Medication Sig Start Date End Date Taking? Authorizing Provider  acetaminophen (TYLENOL) 500 MG tablet Take 500 mg by mouth every 6 (six) hours as needed for moderate pain.   Yes [provider]  methotrexate (RHEUMATREX) 2.5 MG tablet Take 17.5 mg by mouth every Saturday. Caution:Chemotherapy. Protect from light.   Yes [provider]  Multiple Vitamin (MULTIVITAMIN WITH MINERALS) TABS tablet Take 1 tablet by mouth daily.   Yes [provider]  folic acid (FOLVITE) 1 MG tablet Take 1 tablet (1  mg total) by mouth daily. Patient not taking: Reported on 05/27/2021 01/15/21   Fuller Plan, MD  ibuprofen (ADVIL) 200 MG tablet Take 400 mg by mouth every 6 (six) hours as needed.    [provider]  levocetirizine (XYZAL) 5 MG tablet Take 1 tablet (5 mg total) by mouth every evening. Patient not taking: Reported on 02/05/2022 12/16/21   Wallis Bamberg, PA-C  methocarbamol (ROBAXIN) 500 MG tablet Take 500 mg by mouth every 8 (eight) hours as needed for muscle spasms. 02/05/22   [provider]  predniSONE (DELTASONE) 50 MG tablet Take 1 tablet (50 mg total) by mouth daily with breakfast. Patient not taking: Reported on 02/05/2022 12/16/21   Wallis Bamberg, PA-C  promethazine-dextromethorphan (PROMETHAZINE-DM) 6.25-15 MG/5ML syrup Take 5 mLs by mouth 3 (three) times daily as needed for cough. Patient not taking: Reported on 02/05/2022 12/16/21   Wallis Bamberg, PA-C  tiZANidine (ZANAFLEX) 4 MG tablet Take 1 tablet (4 mg total) by mouth at bedtime. Patient not taking: Reported on 02/05/2022 12/16/21   Wallis Bamberg, PA-C  dicyclomine (BENTYL) 20 MG tablet Take 1 tablet (20 mg total) by mouth 2 (two) times daily. Patient not taking: Reported on 11/26/2015 10/12/15 11/26/15  Gilda Crease, MD  diphenhydrAMINE (BENADRYL) 25 MG tablet Take 1 tablet (25 mg total) by mouth every 4 (four) hours as needed  for itching. Patient not taking: Reported on 04/25/2015 04/11/15 05/04/15  Triplett, Babette Relicammy, PA-C  hyoscyamine (LEVSIN SL) 0.125 MG SL tablet Place 1 tablet (0.125 mg total) under the tongue every 4 (four) hours as needed. Patient not taking: Reported on 09/26/2015 06/04/15 10/12/15  Gelene MinkBoone, Anna W, NP  metoCLOPramide (REGLAN) 10 MG tablet Take 1 tablet (10 mg total) by mouth every 8 (eight) hours as needed for nausea or vomiting. Patient not taking: Reported on 11/26/2015 10/13/15 11/26/15  Loren RacerYelverton, Mayra Jolliffe, MD  ranitidine (ZANTAC) 150 MG tablet Take 1 tablet (150 mg total) by mouth 2 (two) times daily. Patient  not taking: Reported on 11/26/2015 10/12/15 11/26/15  Gilda CreasePollina, Christopher J, MD   Allergies  Allergen Reactions   Mango Flavor Anaphylaxis and Swelling    Lips swelling   Penicillins Anaphylaxis and Hives    Has patient had a PCN reaction causing immediate rash, facial/tongue/throat swelling, SOB or lightheadedness with hypotension: Yes Has patient had a PCN reaction causing severe rash involving mucus membranes or skin necrosis: No Has patient had a PCN reaction that required hospitalization No Has patient had a PCN reaction occurring within the last 10 years: No If all of the above answers are "NO", then may proceed with Cephalosporin use.    Oxycodone Hives   Tramadol Nausea And Vomiting   Zofran [Ondansetron Hcl] Other (See Comments)    headache   Keflex [Cephalexin] Hives, Itching and Rash    Patient reports 04/04/16 that she has taken this medication several times in the past and has only developed "itching" but denies rash, hives, anaphylactic reaction, angioedema.   Sulfa Antibiotics Itching and Rash   Toradol [Ketorolac Tromethamine] Rash    Social History   Tobacco Use   Smoking status: Never   Smokeless tobacco: Never  Substance Use Topics   Alcohol use: No    Family History  Problem Relation Age of Onset   Cancer Mother    Diabetes Mother    Lupus Mother    Thyroid disease Mother    Colon cancer Father        AFTER AGE 35   Crohn's disease Sister      Review of Systems  Positive ROS: neg  All other systems have been reviewed and were otherwise negative with the exception of those mentioned in the HPI and as above.  Objective: Vital signs in last 24 hours: Temp:  [97.9 F (36.6 C)] 97.9 F (36.6 C) (06/05 0541) Pulse Rate:  [65] 65 (06/05 0541) Resp:  [18] 18 (06/05 0541) BP: (144)/(88) 144/88 (06/05 0541) SpO2:  [99 %] 99 % (06/05 0541) Weight:  [80.7 kg] 80.7 kg (06/05 0541)  General Appearance: Alert, cooperative, no distress, appears stated  age Head: Normocephalic, without obvious abnormality, atraumatic Eyes: PERRL, conjunctiva/corneas clear, EOM's intact    Neck: Supple, symmetrical, trachea midline Back: Symmetric, no curvature, ROM normal, no CVA tenderness Lungs:  respirations unlabored Heart: Regular rate and rhythm Abdomen: Soft, non-tender Extremities: Extremities normal, atraumatic, no cyanosis or edema Pulses: 2+ and symmetric all extremities Skin: Skin color, texture, turgor normal, no rashes or lesions  NEUROLOGIC:   Mental status: Alert and oriented x4,  no aphasia, good attention span, fund of knowledge, and memory Motor Exam - grossly normal Sensory Exam - grossly normal Reflexes: 1= Coordination - grossly normal Gait - grossly normal Balance - grossly normal Cranial Nerves: I: smell Not tested  II: visual acuity  OS: nl    OD: nl  II: visual fields  Full to confrontation  II: pupils Equal, round, reactive to light  III,VII: ptosis None  III,IV,VI: extraocular muscles  Full ROM  V: mastication Normal  V: facial light touch sensation  Normal  V,VII: corneal reflex  Present  VII: facial muscle function - upper  Normal  VII: facial muscle function - lower Normal  VIII: hearing Not tested  IX: soft palate elevation  Normal  IX,X: gag reflex Present  XI: trapezius strength  5/5  XI: sternocleidomastoid strength 5/5  XI: neck flexion strength  5/5  XII: tongue strength  Normal    Data Review Lab Results  Component Value Date   WBC 5.3 02/09/2022   HGB 15.0 02/09/2022   HCT 42.1 02/09/2022   MCV 95.0 02/09/2022   PLT 204 02/09/2022   Lab Results  Component Value Date   NA 137 02/09/2022   K 3.5 02/09/2022   CL 106 02/09/2022   CO2 24 02/09/2022   BUN 7 02/09/2022   CREATININE 0.76 02/09/2022   GLUCOSE 93 02/09/2022   Lab Results  Component Value Date   INR 0.9 02/09/2022    Assessment/Plan:  Estimated body mass index is 34.76 kg/m as calculated from the following:   Height as  of this encounter: 5' (1.524 m).   Weight as of this encounter: 80.7 kg. Patient admitted for PLIF L4-5 L5-s1. Patient has failed a reasonable attempt at conservative therapy.  I explained the condition and procedure to the patient and answered any questions.  Patient wishes to proceed with procedure as planned. Understands risks/ benefits and typical outcomes of procedure.   Tia Alert 02/09/2022 7:37 AM

## 2022-02-09 NOTE — Progress Notes (Signed)
Orthopedic Tech Progress Note Patient Details:  Regina Patton 09/28/1970 [id number]  Ortho Devices Type of Ortho Device: Lumbar corsett Ortho Device/Splint Location: BACK Ortho Device/Splint Interventions: Ordered   Post Interventions Patient Tolerated: Well Instructions Provided: Care of device  Janit Pagan 02/09/2022, 2:51 PM

## 2022-02-09 NOTE — Anesthesia Procedure Notes (Addendum)
Procedure Name: Intubation Date/Time: 02/09/2022 7:55 AM Performed by: Erick Colace, CRNA Pre-anesthesia Checklist: Patient identified, Emergency Drugs available, Suction available and Patient being monitored Patient Re-evaluated:Patient Re-evaluated prior to induction Oxygen Delivery Method: Circle system utilized Preoxygenation: Pre-oxygenation with 100% oxygen Induction Type: IV induction Ventilation: Mask ventilation without difficulty and Oral airway inserted - appropriate to patient size Laryngoscope Size: Glidescope and 3 Grade View: Grade I Tube type: Oral Tube size: 7.0 mm Number of attempts: 1 Airway Equipment and Method: Stylet and Oral airway Placement Confirmation: ETT inserted through vocal cords under direct vision, positive ETCO2 and breath sounds checked- equal and bilateral Secured at: 22 cm Tube secured with: Tape Dental Injury: Teeth and Oropharynx as per pre-operative assessment  Comments: Elective glidescope due to history of cervical radiculopathy.

## 2022-02-09 NOTE — Transfer of Care (Signed)
Immediate Anesthesia Transfer of Care Note  Patient: Regina Patton  Procedure(s) Performed: Posterior Lumbar Interbody Fusion Lumbar four--Lumbar five - Lumbar five-Sacral one (Back)  Patient Location: PACU  Anesthesia Type:General  Level of Consciousness: drowsy and patient cooperative  Airway & Oxygen Therapy: Patient Spontanous Breathing  Post-op Assessment: Report given to RN and Post -op Vital signs reviewed and stable  Post vital signs: Reviewed and stable  Last Vitals:  Vitals Value Taken Time  BP 130/60 02/09/22 1201  Temp    Pulse 80 02/09/22 1202  Resp 26 02/09/22 1202  SpO2 93 % 02/09/22 1202  Vitals shown include unvalidated device data.  Last Pain:  Vitals:   02/09/22 0623  TempSrc:   PainSc: 9          Complications: No notable events documented.

## 2022-02-09 NOTE — Op Note (Signed)
02/09/2022  11:55 AM  PATIENT:  Regina Patton  51 y.o. 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 Re-do Decompressive lumbar laminectomy, hemi facetectomy and foraminotomies L4-5 and L5-S1 with resection of bilateral synovial cyst L4-5 2. Posterior lumbar interbody fusion L4-5 L5-S1 using porous titanium interbody cages packed with morcellized allograft and autograft  3. Posterior fixation L4-5 L5-S1 using Alphatec cortical pedicle screws.  4. Intertransverse arthrodesis L4-5 L5-S1 using morcellized autograft and allograft.  SURGEON:  Sherley Bounds, MD  ASSISTANTS: Glenford Peers FNP  ANESTHESIA:  General  EBL: 350 ml  Total I/O In: 1000 [I.V.:1000] Out: 640 [Urine:290; Blood:350]  BLOOD ADMINISTERED:none  DRAINS: none   INDICATION FOR PROCEDURE: This patient presented with back pain with bilateral leg pain and difficulty with bowel bladder function. Imaging revealed of your spinal stenosis L4-5 with bilateral synovial cyst, previous decompressive laminectomy L4-5 L5-S1 done at work, and a postlaminectomy dynamic spondylolisthesis at both levels. The patient tried a reasonable attempt at conservative medical measures without relief. I recommended decompression and instrumented fusion to address the stenosis as well as the segmental  instability, we felt that more urgent surgery was necessary because of her bowel and bladder dysfunction and perineal numbness.  Patient understood the risks, benefits, and alternatives and potential outcomes and wished to proceed.  PROCEDURE DETAILS:  The patient was brought to the operating room. After induction of generalized endotracheal anesthesia the patient was rolled into the prone position on chest rolls and all  pressure points were padded. The patient's lumbar region was cleaned and then prepped with DuraPrep and draped in the usual sterile fashion. Anesthesia was injected and then a dorsal midline incision was made and carried down to the lumbosacral fascia. The fascia was opened and the paraspinous musculature was taken down in a subperiosteal fashion to expose L4-5 and L5-S1.  The superior half of the L4 spinous process was remaining but there was no spinous process at L5.  There was a bridge of bone to the superior lamina of L5 that remained.  I dissected out over the facets.  The facets at L4-5 were very arthritic and overgrown.  A self-retaining retractor was placed. Intraoperative fluoroscopy confirmed my level, and I started with placement of the L4 cortical pedicle screws. The pedicle screw entry zones were identified utilizing surface landmarks and  AP and lateral fluoroscopy. I scored the cortex with the high-speed drill and then used the hand drill to drill an upward and outward direction into the pedicle. I then tapped line to line. I then placed a 5.5 x 40 mm cortical pedicle screw into the pedicles of L4 bilaterally.    I then turned my attention to the decompression and complete lumbar laminectomies, hemi- facetectomies, and foraminotomies were performed at L4-5 and L5-S1.  My nurse practitioner was directly involved in the decompression and exposure of the neural elements. the patient had significant spinal stenosis and this required more work than would be required for a simple exposure of the disc for posterior lumbar interbody fusion which would only require a limited laminotomy. Much more generous decompression and generous foraminotomy was undertaken in order to adequately decompress the neural elements and address the patient's leg pain.  I was careful to drill the edges of the bone away from the underlying scar tissue and dura  from the previous decompression.  I drilled across the pars to remove  the facets.  At L4-5 bilaterally were large synovial cyst that were removed en bloc on the left and piecemeal on the right.  I was able to dissect some of the scar tissue away from the dura and indeed tethered the L5 nerve roots bilaterally and march along the pedicle to skeletonized the L5 nerve roots distally.  The yellow ligament was removed to expose the underlying dura and nerve roots, and generous foraminotomies were performed to adequately decompress the neural elements. Both the exiting and traversing nerve roots were decompressed on both sides at both levels until a coronary dilator passed easily along the nerve roots. Once the decompression was complete, I turned my attention to the posterior lumbar interbody fusion. The epidural venous vasculature was coagulated and cut sharply. Disc space was incised and the initial discectomy was performed with pituitary rongeurs. The disc space was distracted with sequential distractors to a height of 10 mm at L4-5 and 9 mm at L5-S1. We then used a series of scrapers and shavers to prepare the endplates for fusion. The midline was prepared with Epstein curettes. Once the complete discectomy was finished, we packed an appropriate sized interbody cage with local autograft and morcellized allograft, gently retracted the nerve root, and tapped the cage into position at L4-5 and L5-S1.  The midline between the cages was packed with morselized autograft and allograft at both levels.   We then turned our attention to the placement of the lower pedicle screws. The pedicle screw entry zones were identified utilizing surface landmarks and fluoroscopy. I drilled into each pedicle utilizing the hand drill, and tapped each pedicle with the appropriate 5.5 tap. We palpated with a ball probe to assure no break in the cortex. We then placed 5.5 x 40 mm pedicle screws at L5 and 6.5 x 40 mm pedicle screws into the pedicles bilaterally at S1.  My nurse practitioner assisted in  placement of the pedicle screws.  We then decorticated the transverse processes and laid a mixture of morcellized autograft and allograft out over these to perform intertransverse arthrodesis at L4-5 and L5-S1. We then placed lordotic rods into the multiaxial screw heads of the pedicle screws and locked these in position with the locking caps and anti-torque device. We then checked our construct with AP and lateral fluoroscopy. Irrigated with copious amounts of bacitracin-containing saline solution. Inspected the nerve roots once again to assure adequate decompression, lined to the dura with Gelfoam,  and then we closed the muscle and the fascia with 0 Vicryl. Closed the subcutaneous tissues with 2-0 Vicryl and subcuticular tissues with 3-0 Vicryl. The skin was closed with benzoin and Steri-Strips. Dressing was then applied, the patient was awakened from general anesthesia and transported to the recovery room in stable condition. At the end of the procedure all sponge, needle and instrument counts were correct.   PLAN OF CARE: admit to inpatient  PATIENT DISPOSITION:  PACU - hemodynamically stable.   Delay start of Pharmacological VTE agent (>24hrs) due to surgical blood loss or risk of bleeding:  yes

## 2022-02-10 MED ORDER — PANTOPRAZOLE SODIUM 40 MG PO TBEC
40.0000 mg | DELAYED_RELEASE_TABLET | Freq: Every day | ORAL | Status: DC
  Administered 2022-02-10: 40 mg via ORAL
  Filled 2022-02-10: qty 1

## 2022-02-10 MED ORDER — ACETAMINOPHEN 500 MG PO TABS
1000.0000 mg | ORAL_TABLET | Freq: Four times a day (QID) | ORAL | Status: AC
  Administered 2022-02-10 – 2022-02-11 (×2): 1000 mg via ORAL
  Filled 2022-02-10 (×3): qty 2

## 2022-02-10 MED ORDER — MELOXICAM 7.5 MG PO TABS
15.0000 mg | ORAL_TABLET | Freq: Every day | ORAL | Status: DC
  Administered 2022-02-10 – 2022-02-11 (×2): 15 mg via ORAL
  Filled 2022-02-10 (×3): qty 2

## 2022-02-10 NOTE — Anesthesia Postprocedure Evaluation (Signed)
Anesthesia Post Note  Patient: Regina Patton  Procedure(s) Performed: Posterior Lumbar Interbody Fusion Lumbar four--Lumbar five - Lumbar five-Sacral one (Back)     Patient location during evaluation: PACU Anesthesia Type: General Level of consciousness: sedated and patient cooperative Pain management: pain level controlled Vital Signs Assessment: post-procedure vital signs reviewed and stable Respiratory status: spontaneous breathing Cardiovascular status: stable Anesthetic complications: no   No notable events documented.  Last Vitals:  Vitals:   02/10/22 0540 02/10/22 0729  BP: 111/70 124/78  Pulse: 60 70  Resp: 18 16  Temp: 37.1 C 36.9 C  SpO2: 95% 95%    Last Pain:  Vitals:   02/10/22 0729  TempSrc: Oral  PainSc:                  Regina Patton

## 2022-02-10 NOTE — Progress Notes (Signed)
Subjective: Patient reports back pain and she had a rough night   Objective: Vital signs in last 24 hours: Temp:  [97.6 F (36.4 C)-98.7 F (37.1 C)] 98.5 F (36.9 C) (06/06 0729) Pulse Rate:  [60-94] 70 (06/06 0729) Resp:  [14-20] 16 (06/06 0729) BP: (101-130)/(53-78) 124/78 (06/06 0729) SpO2:  [92 %-97 %] 95 % (06/06 0729)  Intake/Output from previous day: 06/05 0701 - 06/06 0700 In: 1643 [P.O.:240; I.V.:1203; IV Piggyback:200] Out: 2165 [Urine:1815; Blood:350] Intake/Output this shift: No intake/output data recorded.  Neurologic: Grossly normal  Lab Results: Lab Results  Component Value Date   WBC 5.3 02/09/2022   HGB 15.0 02/09/2022   HCT 42.1 02/09/2022   MCV 95.0 02/09/2022   PLT 204 02/09/2022   Lab Results  Component Value Date   INR 0.9 02/09/2022   BMET Lab Results  Component Value Date   NA 137 02/09/2022   K 3.5 02/09/2022   CL 106 02/09/2022   CO2 24 02/09/2022   GLUCOSE 93 02/09/2022   BUN 7 02/09/2022   CREATININE 0.76 02/09/2022   CALCIUM 9.0 02/09/2022    Studies/Results: DG Lumbar Spine 2-3 Views  Result Date: 02/09/2022 CLINICAL DATA:  [id number]; posterior fusion EXAM: LUMBAR SPINE - 2-3 VIEW COMPARISON:  Radiograph dated February 05, 2022 FINDINGS: Spot fluoroscopy images were obtained for surgical planning purposes. Last full intervertebral disc space is labeled L5-S1. Images demonstrate the patient is status post posterior fixation and intervertebral spacer placement of L4-S1. dose: 70.81 mGy fluoro time 1:11 IMPRESSION: Spot fluoroscopy images were obtained for surgical planning purposes. Electronically Signed   By: Meda Klinefelter M.D.   On: 02/09/2022 11:50   DG C-Arm 1-60 Min-No Report  Result Date: 02/09/2022 Fluoroscopy was utilized by the requesting physician.  No radiographic interpretation.   DG C-Arm 1-60 Min-No Report  Result Date: 02/09/2022 Fluoroscopy was utilized by the requesting physician.  No radiographic interpretation.    DG C-Arm 1-60 Min-No Report  Result Date: 02/09/2022 Fluoroscopy was utilized by the requesting physician.  No radiographic interpretation.    Assessment/Plan: Postop day one 2 level PLIF, continue therapies today and pain control. Can go home later today if she feels ready.   LOS: 1 day    Regina Patton Marci Polito 02/10/2022, 8:03 AM

## 2022-02-10 NOTE — Evaluation (Signed)
Physical Therapy Evaluation  Patient Details Name: Regina HitchSabrina I Bhullar MRN: [id number] DOB: 1971-02-09 Today's Date: 02/10/2022  History of Present Illness  Pt is a 51 y/o female who presents with cauda equina syndrome and is now s/p L4-S1 PLIF on 02/09/2022. PMH significant for Chronic pain, COVID 11/2021, fibromyalgia, pre-diabetes, RA.   Clinical Impression  Pt admitted with above diagnosis. At the time of PT eval, pt was able to demonstrate transfers and ambulation with gross min guard assist to supervision for safety and RW for support. Pt is 5'0" and could benefit from a youth sized RW at home. Pt was educated on precautions, brace application/wearing schedule, appropriate activity progression, and car transfer. Pt currently with functional limitations due to the deficits listed below (see PT Problem List). Pt will benefit from skilled PT to increase their independence and safety with mobility to allow discharge to the venue listed below.         Recommendations for follow up therapy are one component of a multi-disciplinary discharge planning process, led by the attending physician.  Recommendations may be updated based on patient status, additional functional criteria and insurance authorization.  Follow Up Recommendations No PT follow up    Assistance Recommended at Discharge Intermittent Supervision/Assistance  Patient can return home with the following  A little help with walking and/or transfers;A little help with bathing/dressing/bathroom;Assistance with cooking/housework;Assist for transportation;Help with stairs or ramp for entrance    Equipment Recommendations Rolling walker (2 wheels);BSC/3in1 (Youth RW)  Recommendations for Other Services       Functional Status Assessment Patient has had a recent decline in their functional status and demonstrates the ability to make significant improvements in function in a reasonable and predictable amount of time.     Precautions /  Restrictions Precautions Precautions: Fall;Back Precaution Booklet Issued: Yes (comment) Precaution Comments: Reviewed handout and pt was cued for precautions during functional mobility. Required Braces or Orthoses: Spinal Brace Spinal Brace: Lumbar corset;Applied in sitting position Restrictions Weight Bearing Restrictions: No      Mobility  Bed Mobility Overal bed mobility: Needs Assistance Bed Mobility: Sit to Sidelying         Sit to sidelying: Supervision General bed mobility comments: Supervision and cues throughout for optimal spinal alignment. HOB flat and rails lowered to simulate home environment, although pt reports she can sleep in her recliner if needed.    Transfers Overall transfer level: Needs assistance Equipment used: Rolling walker (2 wheels) Transfers: Sit to/from Stand Sit to Stand: Min guard           General transfer comment: Close guard for safety as pt powered up to full stand. Good demonstration of proper hand placement on seated surface for safety.    Ambulation/Gait Ambulation/Gait assistance: Min guard Gait Distance (Feet): 75 Feet Assistive device: Rolling walker (2 wheels) Gait Pattern/deviations: Step-through pattern, Decreased stride length, Trunk flexed Gait velocity: Decreased Gait velocity interpretation: <1.31 ft/sec, indicative of household ambulator   General Gait Details: VC's for improved posture, closer walker proximity, and forward gaze. No assist required however close hands on guarding provided for safety as pt appeared unsteady.  Stairs            Wheelchair Mobility    Modified Rankin (Stroke Patients Only)       Balance Overall balance assessment: Needs assistance Sitting-balance support: Feet supported, No upper extremity supported Sitting balance-Leahy Scale: Fair     Standing balance support: No upper extremity supported, During functional activity Standing balance-Leahy Scale: Fair  Standing balance  comment: Statically, pt was able to stand without UE support, however for dynamic standing balance pt required UE support.                             Pertinent Vitals/Pain Pain Assessment Pain Assessment: 0-10 Pain Score: 8  Pain Location: Incision site Pain Descriptors / Indicators: Operative site guarding, Sore Pain Intervention(s): Limited activity within patient's tolerance, Monitored during session, Repositioned    Home Living Family/patient expects to be discharged to:: Private residence Living Arrangements: Children;Other relatives Available Help at Discharge: Family Type of Home: House Home Access: Stairs to enter   Secretary/administrator of Steps: 1   Home Layout: One level Home Equipment: None      Prior Function Prior Level of Function : Independent/Modified Independent             Mobility Comments: Increased time due to pain ADLs Comments: Incontinent due to cauda equina syndrome and wearing briefs     Hand Dominance        Extremity/Trunk Assessment   Upper Extremity Assessment Upper Extremity Assessment: Defer to OT evaluation    Lower Extremity Assessment Lower Extremity Assessment: Generalized weakness (Decreased sensation and decreased strength consistent with pre-op diagnosis)    Cervical / Trunk Assessment Cervical / Trunk Assessment: Back Surgery  Communication   Communication: No difficulties  Cognition Arousal/Alertness: Awake/alert Behavior During Therapy: WFL for tasks assessed/performed Overall Cognitive Status: Within Functional Limits for tasks assessed                                          General Comments      Exercises     Assessment/Plan    PT Assessment Patient needs continued PT services  PT Problem List Decreased strength;Decreased activity tolerance;Decreased balance;Decreased mobility;Decreased knowledge of use of DME;Decreased safety awareness;Decreased knowledge of  precautions;Pain       PT Treatment Interventions DME instruction;Gait training;Stair training;Functional mobility training;Therapeutic activities;Therapeutic exercise;Neuromuscular re-education;Patient/family education    PT Goals (Current goals can be found in the Care Plan section)  Acute Rehab PT Goals Patient Stated Goal: Home tomorrow PT Goal Formulation: With patient Time For Goal Achievement: 02/17/22 Potential to Achieve Goals: Good    Frequency Min 5X/week     Co-evaluation               AM-PAC PT "6 Clicks" Mobility  Outcome Measure Help needed turning from your back to your side while in a flat bed without using bedrails?: A Little Help needed moving from lying on your back to sitting on the side of a flat bed without using bedrails?: A Little Help needed moving to and from a bed to a chair (including a wheelchair)?: A Little Help needed standing up from a chair using your arms (e.g., wheelchair or bedside chair)?: A Little Help needed to walk in hospital room?: A Little Help needed climbing 3-5 steps with a railing? : A Little 6 Click Score: 18    End of Session Equipment Utilized During Treatment: Gait belt;Back brace Activity Tolerance: Patient tolerated treatment well Patient left: in bed;with call bell/phone within reach Nurse Communication: Mobility status PT Visit Diagnosis: Unsteadiness on feet (R26.81);Pain Pain - part of body:  (back)    Time: 1610-9604 PT Time Calculation (min) (ACUTE ONLY): 16 min   Charges:  PT Evaluation $PT Eval Low Complexity: 1 Low          Conni Slipper, PT, DPT Acute Rehabilitation Services Secure Chat Preferred Office: [phone]   Marylynn Pearson 02/10/2022, 9:35 AM

## 2022-02-10 NOTE — Evaluation (Signed)
Occupational Therapy Evaluation Patient Details Name: Regina Patton MRN: [id number] DOB: 01/23/1971 Today's Date: 02/10/2022   History of Present Illness 51 y/o female who presents with cauda equina syndrome and is now s/p L4-S1 PLIF on 02/09/2022. PMH significant for Chronic pain, COVID 11/2021, fibromyalgia, pre-diabetes, RA.   Clinical Impression   PTA, pt was living with her daughter and son and was performing ADLs. Pt currently performing ADLs and functional mobility at Supervision level. Providing education and handout on back precautions, brace management, grooming, LB ADLs, toileting, and tub transfer. Pt demonstrated and verbalized understanding. Recommend dc to home once medically stable. All acute OT needs met and will sign off. Thank you.      Recommendations for follow up therapy are one component of a multi-disciplinary discharge planning process, led by the attending physician.  Recommendations may be updated based on patient status, additional functional criteria and insurance authorization.   Follow Up Recommendations  No OT follow up    Assistance Recommended at Discharge Intermittent Supervision/Assistance  Patient can return home with the following A little help with bathing/dressing/bathroom;Assistance with cooking/housework;Assist for transportation    Functional Status Assessment  Patient has had a recent decline in their functional status and demonstrates the ability to make significant improvements in function in a reasonable and predictable amount of time.  Equipment Recommendations  Other (comment) (Pt planning to purchase 3n1 and shower seat)    Recommendations for Other Services       Precautions / Restrictions Precautions Precautions: Fall;Back Precaution Booklet Issued: Yes (comment) Precaution Comments: Reviewed handout and pt was cued for precautions during functional mobility. Required Braces or Orthoses: Spinal Brace Spinal Brace: Lumbar  corset;Applied in sitting position Restrictions Weight Bearing Restrictions: No      Mobility Bed Mobility Overal bed mobility: Needs Assistance Bed Mobility: Sit to Sidelying         Sit to sidelying: Supervision General bed mobility comments: Supervision and cues throughout for optimal spinal alignment. HOB flat and rails lowered to simulate home environment, although pt reports she can sleep in her recliner if needed.    Transfers Overall transfer level: Needs assistance Equipment used: None Transfers: Sit to/from Stand Sit to Stand: Supervision           General transfer comment: Supervision for safety      Balance Overall balance assessment: Needs assistance Sitting-balance support: Feet supported, No upper extremity supported Sitting balance-Leahy Scale: Fair     Standing balance support: No upper extremity supported, During functional activity Standing balance-Leahy Scale: Fair                             ADL either performed or assessed with clinical judgement   ADL Overall ADL's : Needs assistance/impaired                                       General ADL Comments: Pt performing ADLs and functional mobiltiy at Supervision level. Providing education on back precautions, brace management, grooming, LB ADLs, toileting, and tub transfer. Pt demonstrating and verbalizing understanding     Vision         Perception     Praxis      Pertinent Vitals/Pain Pain Assessment Pain Assessment: 0-10 Pain Score: 8  Pain Location: Incision site Pain Descriptors / Indicators: Operative site guarding, Sore Pain Intervention(s): Monitored during  session, Limited activity within patient's tolerance, Repositioned     Hand Dominance Right   Extremity/Trunk Assessment Upper Extremity Assessment Upper Extremity Assessment: Overall WFL for tasks assessed   Lower Extremity Assessment Lower Extremity Assessment: Defer to PT evaluation    Cervical / Trunk Assessment Cervical / Trunk Assessment: Back Surgery   Communication Communication Communication: No difficulties   Cognition Arousal/Alertness: Awake/alert Behavior During Therapy: WFL for tasks assessed/performed Overall Cognitive Status: Within Functional Limits for tasks assessed                                       General Comments       Exercises     Shoulder Instructions      Home Living Family/patient expects to be discharged to:: Private residence Living Arrangements: Children;Other relatives Available Help at Discharge: Family (Daughter and son available) Type of Home: House Home Access: Stairs to enter Technical brewer of Steps: 1   Home Layout: One level     Bathroom Shower/Tub: Teacher, early years/pre: Standard     Home Equipment: None          Prior Functioning/Environment Prior Level of Function : Independent/Modified Independent             Mobility Comments: Increased time due to pain ADLs Comments: Performs ADLs and light IADLs. Incontinent due to cauda equina syndrome and wearing briefs        OT Problem List: Decreased activity tolerance;Impaired balance (sitting and/or standing);Decreased knowledge of use of DME or AE;Decreased knowledge of precautions;Pain      OT Treatment/Interventions:      OT Goals(Current goals can be found in the care plan section) Acute Rehab OT Goals Patient Stated Goal: Go home OT Goal Formulation: All assessment and education complete, DC therapy  OT Frequency:      Co-evaluation              AM-PAC OT "6 Clicks" Daily Activity     Outcome Measure Help from another person eating meals?: None Help from another person taking care of personal grooming?: A Little Help from another person toileting, which includes using toliet, bedpan, or urinal?: A Little Help from another person bathing (including washing, rinsing, drying)?: A Little Help from  another person to put on and taking off regular upper body clothing?: A Little Help from another person to put on and taking off regular lower body clothing?: A Little 6 Click Score: 19   End of Session Equipment Utilized During Treatment: Back brace Nurse Communication: Mobility status  Activity Tolerance: Patient tolerated treatment well Patient left: in chair;with call bell/phone within reach  OT Visit Diagnosis: Unsteadiness on feet (R26.81);Muscle weakness (generalized) (M62.81);Pain                Time: 6314-9702 OT Time Calculation (min): 25 min Charges:  OT General Charges $OT Visit: 1 Visit OT Evaluation $OT Eval Low Complexity: 1 Low OT Treatments $Self Care/Home Management : 8-22 mins  Tykel Badie MSOT, OTR/L Acute Rehab Pager: [phone] Office: [place] 02/10/2022, 1:18 PM

## 2022-02-11 MED ORDER — METHOCARBAMOL 500 MG PO TABS: 500.0000 mg | ORAL_TABLET | Freq: Three times a day (TID) | ORAL | 0 refills | Status: DC | PRN

## 2022-02-11 MED ORDER — HYDROCODONE-ACETAMINOPHEN 5-325 MG PO TABS
1.0000 | ORAL_TABLET | ORAL | 0 refills | Status: DC | PRN
Start: 2022-02-11 — End: 2022-03-03

## 2022-02-11 NOTE — Discharge Summary (Signed)
Physician Discharge Summary  Patient ID: Regina Patton MRN: [id number] DOB/AGE: Dec 01, 1970 51 y.o.  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 The patient tolerated the procedure well and was taken to the recovery room and then to the floor in stable condition. The hospital course was routine. There were no complications. The wound remained clean dry and intact. Pt had appropriate back soreness. No complaints of leg pain or new N/T/W. The patient remained afebrile with stable vital signs, and tolerated a regular diet. The patient continued to increase activities, and pain was well controlled with oral pain medications.   Consults: None  Significant Diagnostic Studies:  Results for orders placed or performed during the hospital encounter of 02/09/22  Surgical pcr screen   Specimen: Nasal Mucosa; Nasal Swab  Result Value Ref Range   MRSA, PCR NEGATIVE NEGATIVE   Staphylococcus aureus NEGATIVE NEGATIVE  Protime-INR  Result Value Ref Range   Prothrombin Time 12.3 11.4 - 15.2 seconds   INR 0.9 0.8 - 1.2  Basic metabolic panel per protocol  Result Value Ref Range   Sodium 137 135 - 145 mmol/L   Potassium 3.5 3.5 - 5.1 mmol/L   Chloride 106 98 - 111 mmol/L   CO2 24 22 - 32 mmol/L   Glucose, Bld 93 70 - 99 mg/dL   BUN 7 6 - 20 mg/dL   Creatinine, Ser 3.66 0.44 - 1.00 mg/dL   Calcium 9.0 8.9 - 44.0 mg/dL   GFR, Estimated >34 >74 mL/min   Anion gap 7 5 - 15  CBC per protocol  Result Value Ref Range   WBC 5.3 4.0 - 10.5 K/uL   RBC 4.43 3.87 - 5.11 MIL/uL   Hemoglobin 15.0 12.0  - 15.0 g/dL   HCT 25.9 56.3 - 87.5 %   MCV 95.0 80.0 - 100.0 fL   MCH 33.9 26.0 - 34.0 pg   MCHC 35.6 30.0 - 36.0 g/dL   RDW 64.3 32.9 - 51.8 %   Platelets 204 150 - 400 K/uL   nRBC 0.0 0.0 - 0.2 %  Pregnancy, urine POC  Result Value Ref Range   Preg Test, Ur NEGATIVE NEGATIVE  Type and screen [facility]  Result Value Ref Range   ABO/RH(D) O POS    Antibody Screen NEG    Sample Expiration      02/12/2022,2359 Performed at [facility], [address], [place], [place] [address]   ABO/Rh  Result Value Ref Range   ABO/RH(D)      O POS Performed at [facility], [address], [place], [place] [address]     DG Lumbar Spine 2-3 Views  Result Date: 02/09/2022 CLINICAL DATA:  [id number]; posterior fusion EXAM: LUMBAR SPINE - 2-3 VIEW COMPARISON:  Radiograph dated February 05, 2022 FINDINGS: Spot fluoroscopy images were obtained for surgical planning purposes. Last full intervertebral disc space is labeled L5-S1. Images demonstrate the patient is status post posterior fixation and intervertebral spacer placement of L4-S1. dose: 70.81 mGy fluoro time 1:11 IMPRESSION: Spot fluoroscopy images were obtained for surgical planning purposes. Electronically Signed   By: Meda Klinefelter  M.D.   On: 02/09/2022 11:50   CT Lumbar Spine Wo Contrast  Result Date: 01/21/2022 CLINICAL DATA:  Chronic low back pain. EXAM: CT LUMBAR SPINE WITHOUT CONTRAST TECHNIQUE: Multidetector CT imaging of the lumbar spine was performed without intravenous contrast administration. Multiplanar CT image reconstructions were also generated. RADIATION DOSE REDUCTION: This exam was performed according to the departmental dose-optimization program which includes automated exposure control, adjustment of the mA and/or kV according to patient size and/or use of iterative reconstruction technique. COMPARISON:  CT scan 11/19/2020 FINDINGS: Segmentation: There are five lumbar type vertebral bodies. The last  full intervertebral disc space is labeled L5-S1. Alignment: Normal overall alignment. Very slight degenerative anterolisthesis of L4. Vertebrae: No bone lesions or fractures. Evidence of prior decompressive laminectomy L4-5 and L5-S1 Paraspinal and other soft tissues: No significant paraspinal or retroperitoneal findings. Aortic calcifications are noted but no aneurysm. Disc levels: T12-L1: No significant findings.  Mild facet disease. L1-2: Moderate facet disease but no disc protrusions, spinal or foraminal stenosis. L2-3: Moderate facet disease and slight bulging annulus but no significant spinal or foraminal stenosis. L3-4: Mild diffuse annular bulge, moderate facet disease contributing to early spinal stenosis and mild bilateral lateral recess stenosis. No foraminal stenosis. L4-5: Decompressive laminectomy. There is a bulging uncovered disc. Severe facet disease. No foraminal stenosis. L5-S1: Decompressive laminectomy. No significant spinal or foraminal stenosis. Severe facet disease. IMPRESSION: 1. Early spinal stenosis and mild bilateral lateral recess stenosis at L3-4. 2. Postoperative changes at L4-5 and L5-S1. There is severe disease but no significant spinal or foraminal stenosis. Aortic Atherosclerosis (ICD10-I70.0). Electronically Signed   By: Marijo Sanes M.D.   On: 01/21/2022 20:46   MR LUMBAR SPINE WO CONTRAST  Result Date: 01/30/2022 CLINICAL DATA:  Provided history: Low back pain, cauda equina syndrome suspected. Additional history provided: Patient reports worsening low back pain radiating down bilateral legs for 2 weeks. Patient reports decreased sensation with urination last night. EXAM: MRI LUMBAR SPINE WITHOUT CONTRAST TECHNIQUE: Multiplanar, multisequence MR imaging of the lumbar spine was performed. No intravenous contrast was administered. COMPARISON:  Lumbar spine CT 01/21/2022. Spine radiographs 04/12/2021. FINDINGS: Segmentation: The caudal most well-formed intervertebral disc space  is designated L5-S1. Hypoplastic ribs bilaterally at T12. Alignment: Trace grade 1 retrolisthesis at T12-L1, L1-L2 and L4-L5. 3 mm L4-L5 grade 1 anterolisthesis. 5 mm L5-S1 grade 1 anterolisthesis. Vertebrae: Vertebral body height is maintained. Nondisplaced stress fracture within the right L5 pedicle with surrounding edema (for instance as seen on series 6, image 4). Edema also present within the left L4 and L5 pedicles, and this edema may be degenerative or may reflect stress reaction. Vertebral body hemangiomas at L2 and L4. Conus medullaris and cauda equina: Conus extends to the T12-L1 level. No signal abnormality within the visualized distal spinal cord. Paraspinal and other soft tissues: Incompletely imaged left renal cyst. No paraspinal mass or collection. Disc levels: Multilevel disc degeneration. Disc degeneration is greatest at T12-L1 and L5-S1 (mild-to-moderate at these levels). T11-T12: This level is imaged in the sagittal plane only. Slight disc bulge. No significant disc herniation or stenosis. T12-L1: This level is imaged in the sagittal plane only. Trace grade 1 retrolisthesis. Shallow disc bulge. No significant spinal canal or foraminal stenosis. L1-L2: Trace grade 1 retrolisthesis. Shallow disc bulge. Minimal facet arthrosis. No significant spinal canal or foraminal stenosis. L2-L3: Trace grade 1 retrolisthesis. Slight disc bulge. No significant spinal canal or foraminal stenosis. L3-L4: Trace grade 1 retrolisthesis. Disc bulge. Moderate facet arthrosis. Mild ligamentum flavum hypertrophy. Mild  relative right subarticular narrowing (without nerve root impingement). Central canal patent. inimal relative bilateral neural foraminal narrowing. L4-L5: Prior posterior decompression. 3 mm grade 1 anterolisthesis. Disc uncovering with disc bulge. Advanced facet arthrosis. Bilateral ventrally projecting synovial facet cysts encroach upon the central canal and subarticular recesses. The synovial facet cyst  on the right measures 16 x 4 mm. The synovial facet cyst on the left measures 14 x 6 mm. Resultant severe bilateral subarticular and central canal stenosis. Bilateral neural foraminal narrowing (mild to moderate right, moderate left). L5-S1: Prior posterior decompression. 5 mm grade 1 anterolisthesis. Disc uncovering. Posterior annular fissure within the right subarticular zone. Facet arthrosis (advanced right, moderate left). Small right facet joint effusion. Ligamentum flavum hypertrophy. No significant spinal canal or foraminal stenosis. Findings of severe spinal canal stenosis at L4-L5 discussed with Dr. Melina Copa of the emergency department at 4:30 p.m. on 01/30/2022. IMPRESSION: 1. For the purposes of this dictation, the caudal most well-formed intervertebral disc space is designated L5-S1. Hypoplastic ribs are present at T12, bilaterally. 2. Nondisplaced stress fracture within the right L5 pedicle with surrounding edema. 3. Edema also present within the left L4 and L5 pedicles. This edema may be degenerative or may reflect stress reaction 4. Lumbar spondylosis and postoperative changes, as outlined and with findings most notably as follows. 5. At L4-L5, there has been prior posterior decompression. 3 mm grade 1 anterolisthesis. Disc uncovering with disc bulge. Advanced facet arthrosis. Bilateral ventrally projecting synovial facet cysts encroach upon the central canal and subarticular recesses. Severe bilateral subarticular and central canal stenosis (predominantly secondary to hypertrophic facet arthrosis and the synovial facet cysts). Bilateral neural foraminal narrowing (mild to moderate right, moderate left). 6. At L5-S1, there has been prior posterior decompression. 5 mm grade 1 anterolisthesis. Disc uncovering. Posterior annular fissure in the right subarticular zone. Facet arthrosis (advanced right, moderate left) with ligamentum flavum hypertrophy. Small right facet joint effusion. No significant spinal  canal or foraminal stenosis. Electronically Signed   By: Kellie Simmering D.O.   On: 01/30/2022 16:33   DG C-Arm 1-60 Min-No Report  Result Date: 02/09/2022 Fluoroscopy was utilized by the requesting physician.  No radiographic interpretation.   DG C-Arm 1-60 Min-No Report  Result Date: 02/09/2022 Fluoroscopy was utilized by the requesting physician.  No radiographic interpretation.   DG C-Arm 1-60 Min-No Report  Result Date: 02/09/2022 Fluoroscopy was utilized by the requesting physician.  No radiographic interpretation.    Antibiotics:  Anti-infectives (From admission, onward)    Start     Dose/Rate Route Frequency Ordered Stop   02/09/22 2000  vancomycin (VANCOCIN) IVPB 1000 mg/200 mL premix        1,000 mg 200 mL/hr over 60 Minutes Intravenous  Once 02/09/22 1354 02/09/22 2121   02/09/22 0600  vancomycin (VANCOCIN) IVPB 1000 mg/200 mL premix        1,000 mg 200 mL/hr over 60 Minutes Intravenous On call to O.R. 02/09/22 0545 02/09/22 0753       Discharge Exam: Blood pressure 110/72, pulse 69, temperature 98.4 F (36.9 C), temperature source Oral, resp. rate 16, height 5' (1.524 m), weight 80.7 kg, SpO2 95 %. Neurologic: Grossly normal Ambualting and voiding well incision cdi   Discharge Medications:   Allergies as of 02/11/2022       Reactions   Mango Flavor Anaphylaxis, Swelling   Lips swelling   Penicillins Anaphylaxis, Hives   Has patient had a PCN reaction causing immediate rash, facial/tongue/throat swelling, SOB or lightheadedness with hypotension: Yes  Has patient had a PCN reaction causing severe rash involving mucus membranes or skin necrosis: No Has patient had a PCN reaction that required hospitalization No Has patient had a PCN reaction occurring within the last 10 years: No If all of the above answers are "NO", then may proceed with Cephalosporin use.   Oxycodone Hives   Tramadol Nausea And Vomiting   Zofran [ondansetron Hcl] Other (See Comments)   headache    Keflex [cephalexin] Hives, Itching, Rash   Patient reports 04/04/16 that she has taken this medication several times in the past and has only developed "itching" but denies rash, hives, anaphylactic reaction, angioedema.   Sulfa Antibiotics Itching, Rash   Toradol [ketorolac Tromethamine] Rash        Medication List     STOP taking these medications    ibuprofen 200 MG tablet Commonly known as: ADVIL       TAKE these medications    acetaminophen 500 MG tablet Commonly known as: TYLENOL Take 500 mg by mouth every 6 (six) hours as needed for moderate pain.   folic acid 1 MG tablet Commonly known as: FOLVITE Take 1 tablet (1 mg total) by mouth daily.   HYDROcodone-acetaminophen 5-325 MG tablet Commonly known as: NORCO/VICODIN Take 1 tablet by mouth every 4 (four) hours as needed for moderate pain.   levocetirizine 5 MG tablet Commonly known as: XYZAL Take 1 tablet (5 mg total) by mouth every evening.   methocarbamol 500 MG tablet Commonly known as: ROBAXIN Take 1 tablet (500 mg total) by mouth every 8 (eight) hours as needed for muscle spasms.   methotrexate 2.5 MG tablet Commonly known as: RHEUMATREX Take 17.5 mg by mouth every Saturday. Caution:Chemotherapy. Protect from light.   multivitamin with minerals Tabs tablet Take 1 tablet by mouth daily.   predniSONE 50 MG tablet Commonly known as: DELTASONE Take 1 tablet (50 mg total) by mouth daily with breakfast.   promethazine-dextromethorphan 6.25-15 MG/5ML syrup Commonly known as: PROMETHAZINE-DM Take 5 mLs by mouth 3 (three) times daily as needed for cough.   tiZANidine 4 MG tablet Commonly known as: Zanaflex Take 1 tablet (4 mg total) by mouth at bedtime.               Durable Medical Equipment  (From admission, onward)           Start     Ordered   02/09/22 1317  DME Walker rolling  Once       Question:  Patient needs a walker to treat with the following condition  Answer:  S/P lumbar  fusion   02/09/22 1316   02/09/22 1317  DME 3 n 1  Once        02/09/22 1316            Disposition: home   Final Dx: PLIFL4-5, L5-S1  Discharge Instructions      Remove dressing in 72 hours   Complete by: As directed    Call MD for:  difficulty breathing, headache or visual disturbances   Complete by: As directed    Call MD for:  hives   Complete by: As directed    Call MD for:  persistant dizziness or light-headedness   Complete by: As directed    Call MD for:  persistant nausea and vomiting   Complete by: As directed    Call MD for:  redness, tenderness, or signs of infection (pain, swelling, redness, odor or green/yellow discharge around incision site)   Complete  by: As directed    Call MD for:  severe uncontrolled pain   Complete by: As directed    Call MD for:  temperature >100.4   Complete by: As directed    Diet - low sodium heart healthy   Complete by: As directed    Driving Restrictions   Complete by: As directed    No driving for 2 weeks, no riding in the car for 1 week   Increase activity slowly   Complete by: As directed    Lifting restrictions   Complete by: As directed    No lifting more than 8 lbs          Signed: Ocie Cornfield Ollin Hochmuth 02/11/2022, 10:28 AM

## 2022-02-11 NOTE — Progress Notes (Signed)
Physical Therapy Treatment Patient Details Name: Regina Patton MRN: [id number] DOB: April 14, 1971 Today's Date: 02/11/2022   History of Present Illness 51 y/o female who presents with cauda equina syndrome and is now s/p L4-S1 PLIF on 02/09/2022. PMH significant for Chronic pain, COVID 11/2021, fibromyalgia, pre-diabetes, RA.    PT Comments    Patient has been improving since evaluation. Patient overall functioning at modI level with RW. Reinforced back precautions and brace wear. Patient will have necessary assistance at home at discharge. Patient has met all PT goals. No further skilled PT needs identified. No PT follow up recommended at this time. PT will sign off.    Recommendations for follow up therapy are one component of a multi-disciplinary discharge planning process, led by the attending physician.  Recommendations may be updated based on patient status, additional functional criteria and insurance authorization.  Follow Up Recommendations  No PT follow up     Assistance Recommended at Discharge Intermittent Supervision/Assistance  Patient can return home with the following A little help with walking and/or transfers;A little help with bathing/dressing/bathroom;Assistance with cooking/housework;Assist for transportation;Help with stairs or ramp for entrance   Equipment Recommendations  Rolling Treylan Mcclintock (2 wheels);BSC/3in1    Recommendations for Other Services       Precautions / Restrictions Precautions Precautions: Fall;Back Precaution Booklet Issued: Yes (comment) Precaution Comments: Good recall of back precautions Required Braces or Orthoses: Spinal Brace Spinal Brace: Lumbar corset;Applied in sitting position Restrictions Weight Bearing Restrictions: No     Mobility  Bed Mobility Overal bed mobility: Modified Independent             General bed mobility comments: good recall of log roll technique    Transfers Overall transfer level: Modified  independent Equipment used: Rolling Iridessa Harrow (2 wheels) Transfers: Sit to/from Stand                  Ambulation/Gait Ambulation/Gait assistance: Modified independent (Device/Increase time) Gait Distance (Feet): 100 Feet Assistive device: Rolling Thatiana Renbarger (2 wheels) Gait Pattern/deviations: Step-through pattern, Decreased stride length Gait velocity: Decreased     General Gait Details: improved posture and balance from previous session   Stairs Stairs: Yes Stairs assistance: Supervision Stair Management: One rail Right, Step to pattern, Forwards Number of Stairs: 2     Wheelchair Mobility    Modified Rankin (Stroke Patients Only)       Balance Overall balance assessment: Needs assistance Sitting-balance support: Feet supported, No upper extremity supported Sitting balance-Leahy Scale: Fair     Standing balance support: During functional activity, Bilateral upper extremity supported Standing balance-Leahy Scale: Fair                              Cognition Arousal/Alertness: Awake/alert Behavior During Therapy: WFL for tasks assessed/performed Overall Cognitive Status: Within Functional Limits for tasks assessed                                          Exercises      General Comments        Pertinent Vitals/Pain Pain Assessment Pain Assessment: 0-10 Pain Score: 8  Faces Pain Scale: Hurts little more (reports 8/10 but face doesn't correlate) Pain Location: Incision site Pain Descriptors / Indicators: Operative site guarding, Sore Pain Intervention(s): Monitored during session, Repositioned    Home Living  Prior Function            PT Goals (current goals can now be found in the care plan section) Acute Rehab PT Goals Patient Stated Goal: to go home PT Goal Formulation: With patient Time For Goal Achievement: 02/17/22 Potential to Achieve Goals: Good Progress towards PT goals:  Goals met/education completed, patient discharged from PT    Frequency    Min 5X/week      PT Plan Current plan remains appropriate    Co-evaluation              AM-PAC PT "6 Clicks" Mobility   Outcome Measure  Help needed turning from your back to your side while in a flat bed without using bedrails?: None Help needed moving from lying on your back to sitting on the side of a flat bed without using bedrails?: None Help needed moving to and from a bed to a chair (including a wheelchair)?: None Help needed standing up from a chair using your arms (e.g., wheelchair or bedside chair)?: None Help needed to walk in hospital room?: None Help needed climbing 3-5 steps with a railing? : None 6 Click Score: 24    End of Session Equipment Utilized During Treatment: Back brace Activity Tolerance: Patient tolerated treatment well Patient left: in bed;with call bell/phone within reach Nurse Communication: Mobility status PT Visit Diagnosis: Unsteadiness on feet (R26.81);Pain     Time: 1030-1040 PT Time Calculation (min) (ACUTE ONLY): 10 min  Charges:  $Gait Training: 8-22 mins                     Porcia Morganti A. Gilford Rile PT, DPT Acute Rehabilitation Services Pager [phone] Office [phone]    Linna Hoff 02/11/2022, 12:22 PM

## 2022-02-11 NOTE — Plan of Care (Addendum)
Pt given D/C instructions with verbal understanding. Rx's were sent to the pharmacy by MD. Pt's incision is clean and dry with no sign of infection. Pt's IV was removed prior to D/C. Pt received RW and 3-n-1 from [employer] per MD order. Pt D/C'd home via wheelchair per MD order. Pt is stable @ D/C and has no other needs at this time. Nicholaus Steinke, RN  

## 2022-02-12 ENCOUNTER — Telehealth (INDEPENDENT_AMBULATORY_CARE_PROVIDER_SITE_OTHER): Payer: Self-pay

## 2022-02-12 ENCOUNTER — Encounter (INDEPENDENT_AMBULATORY_CARE_PROVIDER_SITE_OTHER): Payer: Self-pay | Admitting: *Deleted

## 2022-02-12 NOTE — Telephone Encounter (Signed)
Referring MD/PCP: Yong Channel  Procedure: Tcs  Reason/Indication:  Screening, Fam hx of colon ca  Has patient had this procedure before?  yes  If so, when, by whom and where? 12 yrs ago    Is there a family history of colon cancer?  yes  Who?  What age when diagnosed?  Father  Is patient diabetic? If yes, Type 1 or Type 2   no       Does patient have prosthetic heart valve or mechanical valve?  no  Do you have a pacemaker/defibrillator?  no  Has patient ever had endocarditis/atrial fibrillation? no  Does patient use oxygen? no  Has patient had joint replacement within last 12 months?  no  Is patient constipated or do they take laxatives? no  Does patient have a history of alcohol/drug use?  no  Have you had a stroke/heart attack last 6 mths? no  Do you take medicine for weight loss?  no  For female patients,: have you had a hysterectomy no                       are you post menopausal yes                      do you still have your menstrual cycle no   Is patient on blood thinner such as Coumadin, Plavix and/or Aspirin? no  Medications: methotrexate 25 mg 7 tabs once a week, Vit D 1000 unit 4 tabd daily, ropinirole 5 mg tid, folic acid 1 mg daily, oxycodone 5 mg tid prn, prednisone 10 mg daily  Allergies: Toradol, Tramadol, Sulfur  Medication Adjustment per Dr Jenetta Downer none   Procedure date & time:

## 2022-02-27 ENCOUNTER — Other Ambulatory Visit (HOSPITAL_COMMUNITY): Payer: Self-pay | Admitting: Student

## 2022-02-27 ENCOUNTER — Other Ambulatory Visit: Payer: Self-pay | Admitting: Student

## 2022-02-27 DIAGNOSIS — M5416 Radiculopathy, lumbar region: Secondary | ICD-10-CM

## 2022-03-03 ENCOUNTER — Other Ambulatory Visit: Payer: Self-pay

## 2022-03-03 ENCOUNTER — Emergency Department (HOSPITAL_COMMUNITY): Payer: Medicare Other

## 2022-03-03 ENCOUNTER — Encounter (HOSPITAL_COMMUNITY): Payer: Self-pay

## 2022-03-03 ENCOUNTER — Emergency Department (HOSPITAL_COMMUNITY)
Admission: EM | Admit: 2022-03-03 | Discharge: 2022-03-03 | Disposition: A | Discharge status: Home / Self Care | Payer: Medicare Other | Attending: Emergency Medicine | Admitting: Emergency Medicine

## 2022-03-03 DIAGNOSIS — M545 Low back pain, unspecified: Secondary | ICD-10-CM | POA: Insufficient documentation

## 2022-03-03 DIAGNOSIS — M5440 Lumbago with sciatica, unspecified side: Secondary | ICD-10-CM

## 2022-03-03 DIAGNOSIS — Z79899 Other long term (current) drug therapy: Secondary | ICD-10-CM | POA: Insufficient documentation

## 2022-03-03 LAB — CBC WITH DIFFERENTIAL/PLATELET
Abs Immature Granulocytes: 0.04 10*3/uL (ref 0.00–0.07)
Basophils Absolute: 0 10*3/uL (ref 0.0–0.1)
Basophils Relative: 1 %
Eosinophils Absolute: 0.2 10*3/uL (ref 0.0–0.5)
Eosinophils Relative: 2 %
HCT: 43.8 % (ref 36.0–46.0)
Hemoglobin: 14.9 g/dL (ref 12.0–15.0)
Immature Granulocytes: 1 %
Lymphocytes Relative: 57 %
Lymphs Abs: 4.6 10*3/uL — ABNORMAL HIGH (ref 0.7–4.0)
MCH: 32.7 pg (ref 26.0–34.0)
MCHC: 34 g/dL (ref 30.0–36.0)
MCV: 96.3 fL (ref 80.0–100.0)
Monocytes Absolute: 0.5 10*3/uL (ref 0.1–1.0)
Monocytes Relative: 7 %
Neutro Abs: 2.5 10*3/uL (ref 1.7–7.7)
Neutrophils Relative %: 32 %
Platelets: 314 10*3/uL (ref 150–400)
RBC: 4.55 MIL/uL (ref 3.87–5.11)
RDW: 14.2 % (ref 11.5–15.5)
WBC: 7.9 10*3/uL (ref 4.0–10.5)
nRBC: 0 % (ref 0.0–0.2)

## 2022-03-03 LAB — COMPREHENSIVE METABOLIC PANEL
ALT: 14 U/L (ref 0–44)
AST: 17 U/L (ref 15–41)
Albumin: 3.7 g/dL (ref 3.5–5.0)
Alkaline Phosphatase: 56 U/L (ref 38–126)
Anion gap: 10 (ref 5–15)
BUN: 15 mg/dL (ref 6–20)
CO2: 26 mmol/L (ref 22–32)
Calcium: 9 mg/dL (ref 8.9–10.3)
Chloride: 104 mmol/L (ref 98–111)
Creatinine, Ser: 0.78 mg/dL (ref 0.44–1.00)
GFR, Estimated: >60 mL/min (ref >60)
Glucose, Bld: 81 mg/dL (ref 70–99)
Potassium: 3.8 mmol/L (ref 3.5–5.1)
Sodium: 140 mmol/L (ref 135–145)
Total Bilirubin: 0.6 mg/dL (ref 0.3–1.2)
Total Protein: 6.5 g/dL (ref 6.5–8.1)

## 2022-03-03 MED ORDER — HYDROMORPHONE HCL 4 MG PO TABS: 4.0000 mg | ORAL_TABLET | Freq: Four times a day (QID) | ORAL | 0 refills | Status: DC | PRN

## 2022-03-03 MED ORDER — METHYLPREDNISOLONE 4 MG PO TBPK: ORAL_TABLET | ORAL | 0 refills | Status: DC

## 2022-03-03 MED ORDER — HYDROMORPHONE HCL 1 MG/ML IJ SOLN
0.5000 mg | Freq: Once | INTRAMUSCULAR | Status: AC
  Administered 2022-03-03: 0.5 mg via INTRAVENOUS
  Filled 2022-03-03: qty 0.5

## 2022-03-03 MED ORDER — HYDROMORPHONE HCL 1 MG/ML IJ SOLN
1.0000 mg | Freq: Once | INTRAMUSCULAR | Status: AC
  Administered 2022-03-03: 1 mg via INTRAVENOUS
  Filled 2022-03-03: qty 1

## 2022-03-03 NOTE — ED Notes (Signed)
Pt gone to MRI 

## 2022-03-03 NOTE — ED Notes (Signed)
Pt returned from MRI °

## 2022-03-03 NOTE — ED Provider Notes (Signed)
[facility] EMERGENCY DEPARTMENT Provider Note   CSN: [id number] Arrival date & time: 03/03/22  0820     History  Chief Complaint  Patient presents with   Back Pain    Regina Patton is a 51 y.o. female.  Patient with recent back surgery and history of chronic back pain.  She complains of worsening pain running and down her left leg  The history is provided by medical records. No language interpreter was used.  Back Pain Location:  Lumbar spine Quality:  Aching Radiates to:  L thigh Pain severity:  Severe Pain is:  Unable to specify Onset quality:  Gradual Duration:  2 weeks Timing:  Constant Progression:  Worsening Chronicity:  Recurrent Context: not emotional stress   Relieved by:  Nothing Exacerbated by: Movement. Associated symptoms: no abdominal pain, no chest pain and no headaches        Home Medications Prior to Admission medications   Medication Sig Start Date End Date Taking? Authorizing Provider  acetaminophen (TYLENOL) 500 MG tablet Take 500 mg by mouth every 6 (six) hours as needed for moderate pain.   Yes [provider]  methocarbamol (ROBAXIN) 500 MG tablet Take 1 tablet (500 mg total) by mouth every 8 (eight) hours as needed for muscle spasms. 02/11/22  Yes Meyran, Tiana Loft, NP  oxyCODONE-acetaminophen (PERCOCET/ROXICET) 5-325 MG tablet Take 1 tablet by mouth every 6 (six) hours as needed for pain. 02/26/22  Yes [provider]  folic acid (FOLVITE) 1 MG tablet Take 1 tablet (1 mg total) by mouth daily. Patient not taking: Reported on 05/27/2021 01/15/21   Fuller Plan, MD  HYDROcodone-acetaminophen (NORCO/VICODIN) 5-325 MG tablet Take 1 tablet by mouth every 4 (four) hours as needed for moderate pain. Patient not taking: Reported on 03/03/2022 02/11/22 02/11/23  Sherryl Manges, NP  levocetirizine (XYZAL) 5 MG tablet Take 1 tablet (5 mg total) by mouth every evening. Patient not taking: Reported on 02/05/2022 12/16/21    Wallis Bamberg, PA-C  methotrexate (RHEUMATREX) 2.5 MG tablet Take 17.5 mg by mouth every Saturday. Caution:Chemotherapy. Protect from light. Patient not taking: Reported on 03/03/2022    [provider]  predniSONE (DELTASONE) 50 MG tablet Take 1 tablet (50 mg total) by mouth daily with breakfast. Patient not taking: Reported on 02/05/2022 12/16/21   Wallis Bamberg, PA-C  promethazine-dextromethorphan (PROMETHAZINE-DM) 6.25-15 MG/5ML syrup Take 5 mLs by mouth 3 (three) times daily as needed for cough. Patient not taking: Reported on 02/05/2022 12/16/21   Wallis Bamberg, PA-C  tiZANidine (ZANAFLEX) 4 MG tablet Take 1 tablet (4 mg total) by mouth at bedtime. Patient not taking: Reported on 02/05/2022 12/16/21   Wallis Bamberg, PA-C  dicyclomine (BENTYL) 20 MG tablet Take 1 tablet (20 mg total) by mouth 2 (two) times daily. Patient not taking: Reported on 11/26/2015 10/12/15 11/26/15  Gilda Crease, MD  diphenhydrAMINE (BENADRYL) 25 MG tablet Take 1 tablet (25 mg total) by mouth every 4 (four) hours as needed for itching. Patient not taking: Reported on 04/25/2015 04/11/15 05/04/15  Triplett, Babette Relic, PA-C  hyoscyamine (LEVSIN SL) 0.125 MG SL tablet Place 1 tablet (0.125 mg total) under the tongue every 4 (four) hours as needed. Patient not taking: Reported on 09/26/2015 06/04/15 10/12/15  Gelene Mink, NP  metoCLOPramide (REGLAN) 10 MG tablet Take 1 tablet (10 mg total) by mouth every 8 (eight) hours as needed for nausea or vomiting. Patient not taking: Reported on 11/26/2015 10/13/15 11/26/15  Loren Racer, MD  ranitidine Orpha Bur)  150 MG tablet Take 1 tablet (150 mg total) by mouth 2 (two) times daily. Patient not taking: Reported on 11/26/2015 10/12/15 11/26/15  Gilda Crease, MD      Allergies    Mango flavor, Penicillins, Oxycodone, Tramadol, Zofran [ondansetron hcl], Keflex [cephalexin], Sulfa antibiotics, and Toradol [ketorolac tromethamine]    Review of Systems   Review of Systems   Constitutional:  Negative for appetite change and fatigue.  HENT:  Negative for congestion, ear discharge and sinus pressure.   Eyes:  Negative for discharge.  Respiratory:  Negative for cough.   Cardiovascular:  Negative for chest pain.  Gastrointestinal:  Negative for abdominal pain and diarrhea.  Genitourinary:  Negative for frequency and hematuria.  Musculoskeletal:  Positive for back pain.  Skin:  Negative for rash.  Neurological:  Negative for seizures and headaches.  Psychiatric/Behavioral:  Negative for hallucinations.     Physical Exam Updated Vital Signs BP 115/73   Pulse 78   Temp 98.1 F (36.7 C) (Oral)   Resp 18   Ht 5' (1.524 m)   Wt 79.4 kg   SpO2 90%   BMI 34.18 kg/m  Physical Exam Vitals and nursing note reviewed.  Constitutional:      Appearance: She is well-developed.  HENT:     Head: Normocephalic.     Nose: Nose normal.  Eyes:     General: No scleral icterus.    Conjunctiva/sclera: Conjunctivae normal.  Neck:     Thyroid: No thyromegaly.  Cardiovascular:     Rate and Rhythm: Normal rate and regular rhythm.     Heart sounds: No murmur heard.    No friction rub. No gallop.  Pulmonary:     Breath sounds: No stridor. No wheezing or rales.  Chest:     Chest wall: No tenderness.  Abdominal:     General: There is no distension.     Tenderness: There is no abdominal tenderness. There is no rebound.  Musculoskeletal:     Cervical back: Neck supple.     Comments: Tender lumbar spine with positive straight leg on the left  Lymphadenopathy:     Cervical: No cervical adenopathy.  Skin:    Findings: No erythema or rash.  Neurological:     Mental Status: She is alert and oriented to person, place, and time.     Motor: No abnormal muscle tone.     Coordination: Coordination normal.  Psychiatric:        Behavior: Behavior normal.     ED Results / Procedures / Treatments   Labs (all labs ordered are listed, but only abnormal results are  displayed) Labs Reviewed  CBC WITH DIFFERENTIAL/PLATELET - Abnormal; Notable for the following components:      Result Value   Lymphs Abs 4.6 (*)    All other components within normal limits  COMPREHENSIVE METABOLIC PANEL    EKG None  Radiology MR LUMBAR SPINE WO CONTRAST  Result Date: 03/03/2022 CLINICAL DATA:  Increasing back pain extending into both feet for 2 days. Cancer suspected. Back surgery 02/09/2022. EXAM: MRI LUMBAR SPINE WITHOUT CONTRAST TECHNIQUE: Multiplanar, multisequence MR imaging of the lumbar spine was performed. No intravenous contrast was administered. COMPARISON:  Lumbar MRI 01/30/2022.  CT lumbar spine 01/21/2022. FINDINGS: Segmentation: Conventional anatomy assumed, with the last open disc space designated L5-S1.Concordant with previous imaging. Alignment:  Physiologic. Vertebrae: Interval laminectomy and PLIF from L4 through S1. No evidence of acute fracture or pars defect. No suspicious marrow signal abnormalities. The visualized  sacroiliac joints appear unremarkable. Conus medullaris: Extends to the T12-L1 level and appears normal. Paraspinal and other soft tissues: Nonspecific fluid collection within the laminectomy bed measures up to 5.2 x 3.1 cm transverse and extends approximately 4.6 cm in length. In addition, there is a subcutaneous fluid collection posteriorly which measures 3.1 x 1.9 x 6.5 cm. No anterior paraspinal abnormalities or epidural fluid collections. Disc levels: Stable mild disc bulge from T11-12 through L2-3 without resulting spinal stenosis or nerve root encroachment. L3-4: Mild disc bulging, facet and ligamentous hypertrophy. No significant spinal stenosis or nerve root encroachment. L4-5: Interval laminectomy, partial facetectomy and PLIF. As above, nonspecific fluid collection in the laminectomy bed with good decompression of the thecal sac and lateral recesses. Both foramina appear patent. L5-S1: Interval laminectomy, partial facetectomy and PLIF.  Good decompression of the spinal canal and foramina. IMPRESSION: 1. Interval posterior decompression and PLIF from L4 through S1. Associated nonspecific fluid collections within the laminectomy bed and posterior subcutaneous tissues. 2. The spinal canal, lateral recesses and foramina appear decompressed at the operative levels. 3. No evidence of discitis or osteomyelitis. 4. Stable mild spondylosis at the non operative levels. Electronically Signed   By: Carey Bullocks M.D.   On: 03/03/2022 14:22    Procedures Procedures    Medications Ordered in ED Medications  HYDROmorphone (DILAUDID) injection 1 mg (1 mg Intravenous Given 03/03/22 1337)  HYDROmorphone (DILAUDID) injection 0.5 mg (0.5 mg Intravenous Given 03/03/22 1500)    ED Course/ Medical Decision Making/ A&P Patient with worsening pain after surgery on her back.  I spoke with Dr. Yetta Barre neurosurgery and the MRI did not show anything bad and was typical for postop changes.                         Medical Decision Making Amount and/or Complexity of Data Reviewed Labs: ordered. Radiology: ordered.  Risk Prescription drug management.   Patient with worsening lower back pain she is given a second Medrol Dosepak and some Dilaudid This patient presents to the ED for concern of back pain after lumbar surgery, this involves an extensive number of treatment options, and is a complaint that carries with it a high risk of complications and morbidity.  The differential diagnosis includes exacerbation of chronic back pain, infection   Co morbidities that complicate the patient evaluation  Ruptured lumbar disc with surgery   Additional history obtained:  Additional history obtained from her neurosurgeon External records from outside source obtained and reviewed including hospital record   Lab Tests:  I Ordered, and personally interpreted labs.  The pertinent results include: CBC and chemistry unremarkable   Imaging Studies  ordered:  I ordered imaging studies including MRI of the back I independently visualized and interpreted imaging which showed some fluid collection I agree with the radiologist interpretation   Cardiac Monitoring: / EKG:  The patient was maintained on a cardiac monitor.  I personally viewed and interpreted the cardiac monitored which showed an underlying rhythm of: Normal sinus rhythm   Consultations Obtained:  I requested consultation with the neurosurgery,  and discussed lab and imaging findings as well as pertinent plan - they recommend: Medrol Dosepak and pain medicine and follow-up   Problem List / ED Course / Critical interventions / Medication management  Lumbar pain after lumbar surgery I ordered medication including Motrin for pain Reevaluation of the patient after these medicines showed that the patient improved I have reviewed the patients home medicines and  have made adjustments as needed   Social Determinants of Health:  None   Test / Admission - Considered:  No additional test needed Patient with worsening back pain.  She is given a second Medrol Dosepak and Dilaudid for pain.  Patient was discussed with her neurosurgeon       Final Clinical Impression(s) / ED Diagnoses Final diagnoses:  None    Rx / DC Orders ED Discharge Orders     None         Bethann Berkshire, MD 03/04/22 1640

## 2022-03-03 NOTE — ED Notes (Signed)
Pt took percocet 5 and muscle relaxer at approx 0800 without relief. Has had increased back pain with tingling to bilat feet x2 days, and is scheduled for MRI 03/12/22

## 2022-03-12 ENCOUNTER — Encounter (HOSPITAL_COMMUNITY): Payer: Self-pay

## 2022-03-12 ENCOUNTER — Ambulatory Visit (HOSPITAL_COMMUNITY): Admission: RE | Admit: 2022-03-12 | Payer: Medicare Other | Source: Ambulatory Visit

## 2022-03-17 DIAGNOSIS — M5416 Radiculopathy, lumbar region: Secondary | ICD-10-CM | POA: Diagnosis not present

## 2022-04-09 ENCOUNTER — Encounter (INDEPENDENT_AMBULATORY_CARE_PROVIDER_SITE_OTHER): Payer: Self-pay | Admitting: *Deleted

## 2022-04-28 DIAGNOSIS — M5416 Radiculopathy, lumbar region: Secondary | ICD-10-CM | POA: Diagnosis not present

## 2022-05-19 DIAGNOSIS — Z9889 Other specified postprocedural states: Secondary | ICD-10-CM | POA: Diagnosis not present

## 2022-06-10 DIAGNOSIS — R5383 Other fatigue: Secondary | ICD-10-CM | POA: Diagnosis not present

## 2022-06-10 DIAGNOSIS — M129 Arthropathy, unspecified: Secondary | ICD-10-CM | POA: Diagnosis not present

## 2022-06-10 DIAGNOSIS — M545 Low back pain, unspecified: Secondary | ICD-10-CM | POA: Diagnosis not present

## 2022-06-10 DIAGNOSIS — E559 Vitamin D deficiency, unspecified: Secondary | ICD-10-CM | POA: Diagnosis not present

## 2022-06-10 DIAGNOSIS — Z79899 Other long term (current) drug therapy: Secondary | ICD-10-CM | POA: Diagnosis not present

## 2022-06-10 DIAGNOSIS — Z1159 Encounter for screening for other viral diseases: Secondary | ICD-10-CM | POA: Diagnosis not present

## 2022-06-10 DIAGNOSIS — Z131 Encounter for screening for diabetes mellitus: Secondary | ICD-10-CM | POA: Diagnosis not present

## 2022-06-11 DIAGNOSIS — Z79899 Other long term (current) drug therapy: Secondary | ICD-10-CM | POA: Diagnosis not present

## 2022-06-24 DIAGNOSIS — E559 Vitamin D deficiency, unspecified: Secondary | ICD-10-CM | POA: Diagnosis not present

## 2022-06-24 DIAGNOSIS — Z131 Encounter for screening for diabetes mellitus: Secondary | ICD-10-CM | POA: Diagnosis not present

## 2022-06-24 DIAGNOSIS — Z79899 Other long term (current) drug therapy: Secondary | ICD-10-CM | POA: Diagnosis not present

## 2022-06-24 DIAGNOSIS — M5430 Sciatica, unspecified side: Secondary | ICD-10-CM | POA: Diagnosis not present

## 2022-06-24 DIAGNOSIS — M545 Low back pain, unspecified: Secondary | ICD-10-CM | POA: Diagnosis not present

## 2022-07-21 DIAGNOSIS — Z79899 Other long term (current) drug therapy: Secondary | ICD-10-CM | POA: Diagnosis not present

## 2022-07-21 DIAGNOSIS — M545 Low back pain, unspecified: Secondary | ICD-10-CM | POA: Diagnosis not present

## 2022-07-21 DIAGNOSIS — Z131 Encounter for screening for diabetes mellitus: Secondary | ICD-10-CM | POA: Diagnosis not present

## 2022-07-21 DIAGNOSIS — E559 Vitamin D deficiency, unspecified: Secondary | ICD-10-CM | POA: Diagnosis not present

## 2022-08-21 DIAGNOSIS — M5430 Sciatica, unspecified side: Secondary | ICD-10-CM | POA: Diagnosis not present

## 2022-08-21 DIAGNOSIS — Z131 Encounter for screening for diabetes mellitus: Secondary | ICD-10-CM | POA: Diagnosis not present

## 2022-08-21 DIAGNOSIS — Z23 Encounter for immunization: Secondary | ICD-10-CM | POA: Diagnosis not present

## 2022-08-21 DIAGNOSIS — M545 Low back pain, unspecified: Secondary | ICD-10-CM | POA: Diagnosis not present

## 2022-08-21 DIAGNOSIS — Z79899 Other long term (current) drug therapy: Secondary | ICD-10-CM | POA: Diagnosis not present

## 2022-08-24 DIAGNOSIS — Z79899 Other long term (current) drug therapy: Secondary | ICD-10-CM | POA: Diagnosis not present

## 2022-09-02 DIAGNOSIS — R059 Cough, unspecified: Secondary | ICD-10-CM | POA: Diagnosis not present

## 2022-09-02 DIAGNOSIS — R300 Dysuria: Secondary | ICD-10-CM | POA: Diagnosis not present

## 2022-09-21 DIAGNOSIS — Z79899 Other long term (current) drug therapy: Secondary | ICD-10-CM | POA: Diagnosis not present

## 2022-09-21 DIAGNOSIS — M545 Low back pain, unspecified: Secondary | ICD-10-CM | POA: Diagnosis not present

## 2022-09-21 DIAGNOSIS — R030 Elevated blood-pressure reading, without diagnosis of hypertension: Secondary | ICD-10-CM | POA: Diagnosis not present

## 2022-09-21 DIAGNOSIS — Z0000 Encounter for general adult medical examination without abnormal findings: Secondary | ICD-10-CM | POA: Diagnosis not present

## 2022-09-21 DIAGNOSIS — M5430 Sciatica, unspecified side: Secondary | ICD-10-CM | POA: Diagnosis not present

## 2022-09-23 DIAGNOSIS — Z79899 Other long term (current) drug therapy: Secondary | ICD-10-CM | POA: Diagnosis not present

## 2022-10-09 ENCOUNTER — Encounter (INDEPENDENT_AMBULATORY_CARE_PROVIDER_SITE_OTHER): Payer: Self-pay | Admitting: *Deleted

## 2022-10-26 DIAGNOSIS — H66001 Acute suppurative otitis media without spontaneous rupture of ear drum, right ear: Secondary | ICD-10-CM | POA: Diagnosis not present

## 2022-10-26 DIAGNOSIS — Z131 Encounter for screening for diabetes mellitus: Secondary | ICD-10-CM | POA: Diagnosis not present

## 2022-10-26 DIAGNOSIS — Z79899 Other long term (current) drug therapy: Secondary | ICD-10-CM | POA: Diagnosis not present

## 2022-10-26 DIAGNOSIS — R030 Elevated blood-pressure reading, without diagnosis of hypertension: Secondary | ICD-10-CM | POA: Diagnosis not present

## 2022-10-26 DIAGNOSIS — M5430 Sciatica, unspecified side: Secondary | ICD-10-CM | POA: Diagnosis not present

## 2022-10-26 DIAGNOSIS — M545 Low back pain, unspecified: Secondary | ICD-10-CM | POA: Diagnosis not present

## 2022-10-28 DIAGNOSIS — Z79899 Other long term (current) drug therapy: Secondary | ICD-10-CM | POA: Diagnosis not present

## 2022-11-05 ENCOUNTER — Encounter: Payer: Self-pay | Admitting: Radiology

## 2022-11-23 DIAGNOSIS — Z79899 Other long term (current) drug therapy: Secondary | ICD-10-CM | POA: Diagnosis not present

## 2022-11-23 DIAGNOSIS — M5430 Sciatica, unspecified side: Secondary | ICD-10-CM | POA: Diagnosis not present

## 2022-11-23 DIAGNOSIS — R030 Elevated blood-pressure reading, without diagnosis of hypertension: Secondary | ICD-10-CM | POA: Diagnosis not present

## 2022-11-23 DIAGNOSIS — H6692 Otitis media, unspecified, left ear: Secondary | ICD-10-CM | POA: Diagnosis not present

## 2022-11-23 DIAGNOSIS — Z131 Encounter for screening for diabetes mellitus: Secondary | ICD-10-CM | POA: Diagnosis not present

## 2022-11-25 DIAGNOSIS — Z79899 Other long term (current) drug therapy: Secondary | ICD-10-CM | POA: Diagnosis not present

## 2022-12-22 DIAGNOSIS — Z79899 Other long term (current) drug therapy: Secondary | ICD-10-CM | POA: Diagnosis not present

## 2022-12-22 DIAGNOSIS — R030 Elevated blood-pressure reading, without diagnosis of hypertension: Secondary | ICD-10-CM | POA: Diagnosis not present

## 2022-12-22 DIAGNOSIS — Z131 Encounter for screening for diabetes mellitus: Secondary | ICD-10-CM | POA: Diagnosis not present

## 2022-12-22 DIAGNOSIS — Z8719 Personal history of other diseases of the digestive system: Secondary | ICD-10-CM | POA: Diagnosis not present

## 2022-12-22 DIAGNOSIS — M545 Low back pain, unspecified: Secondary | ICD-10-CM | POA: Diagnosis not present

## 2022-12-28 DIAGNOSIS — Z79899 Other long term (current) drug therapy: Secondary | ICD-10-CM | POA: Diagnosis not present

## 2023-01-08 ENCOUNTER — Ambulatory Visit
Admission: EM | Admit: 2023-01-08 | Discharge: 2023-01-08 | Disposition: A | Discharge status: Home / Self Care | Payer: 59

## 2023-01-08 DIAGNOSIS — W57XXXA Bitten or stung by nonvenomous insect and other nonvenomous arthropods, initial encounter: Secondary | ICD-10-CM

## 2023-01-08 DIAGNOSIS — R030 Elevated blood-pressure reading, without diagnosis of hypertension: Secondary | ICD-10-CM

## 2023-01-08 DIAGNOSIS — S80262A Insect bite (nonvenomous), left knee, initial encounter: Secondary | ICD-10-CM | POA: Diagnosis not present

## 2023-01-08 DIAGNOSIS — L089 Local infection of the skin and subcutaneous tissue, unspecified: Secondary | ICD-10-CM

## 2023-01-08 MED ORDER — DOXYCYCLINE HYCLATE 100 MG PO CAPS: 100.0000 mg | ORAL_CAPSULE | Freq: Two times a day (BID) | ORAL | 0 refills | Status: DC

## 2023-01-08 MED ORDER — MUPIROCIN 2 % EX OINT
1.0000 | TOPICAL_OINTMENT | Freq: Two times a day (BID) | CUTANEOUS | 0 refills | Status: AC
Start: 2023-01-08 — End: ?

## 2023-01-08 NOTE — ED Triage Notes (Signed)
Pt reports she has a spider bite in left leg x 1 day. Reports burning sensation and redness in the area.

## 2023-01-08 NOTE — ED Provider Notes (Signed)
[facility]    CSN: [id number] Arrival date & time: 01/08/23  1337      History   Chief Complaint Chief Complaint  Patient presents with   Insect Bite    HPI Regina Patton is a 52 y.o. female.   Patient presents today with a 1 day history of painful pruritic lesion on her left medial leg.  She reports that she was laying her dog and when she felt something bite her.  She did not see what type of insect this was.  She reports that she used over-the-counter medication including hydrocortisone cream with temporary improvement only to notice that the redness has significantly spread overnight prompting evaluation.  She denies history of recurrent skin infections.  Denies any recent antibiotics.  Denies history of MRSA.  She denies any associated fever, nausea, vomiting.  She is confident that she is up-to-date on her tetanus which was given within the past 2 years though there is not a record of this in MRN.  Patient was noted to have elevated blood pressure.  She denies formal diagnosis of essential hypertension has never taken antihypertensive medications.  Denies chest pain, shortness of breath, severe headache, vision change, weakness.  She does have a primary care and follows with them closely.  She does monitor her blood pressure at home and reports generally blood pressure readings are normal.  Denies any recent NSAIDs, decongestants, caffeine, sodium.    Past Medical History:  Diagnosis Date   C. difficile diarrhea    Cervical radiculopathy    Chronic abdominal pain    Chronic back pain    Chronic knee pain    Chronic neck pain    COVID 11/2021   mild   Depression    Fibromyalgia    GERD (gastroesophageal reflux disease)    Pneumonia    Pre-diabetes    Rheumatoid arthritis (HCC)    "Rhematoid factor positive but no symptoms" per EPIC notes 2012   Rib pain on right side    chronic   Sciatic pain    right    Patient Active Problem List   Diagnosis Date  Noted   S/P lumbar fusion 02/09/2022   Opioid use 02/27/2021   Seropositive rheumatoid arthritis (HCC) 01/15/2021   High risk medication use 01/15/2021   Cholecystitis with cholelithiasis 10/17/2015   C. difficile colitis 04/19/2015   Essential hypertension 04/05/2015   Chronic pain syndrome 04/05/2015    Past Surgical History:  Procedure Laterality Date   BACK SURGERY     BLADDER SURGERY     CHOLECYSTECTOMY N/A 10/18/2015   Procedure: LAPAROSCOPIC CHOLECYSTECTOMY;  Surgeon: Franky Macho, MD;  Location: [facility];  Service: General;  Laterality: N/A;   TUBAL LIGATION     URETHRAL DIVERTICULECTOMY      OB History     Gravida  3   Para  3   Term  3   Preterm      AB      Living  3      SAB      IAB      Ectopic      Multiple      Live Births               Home Medications    Prior to Admission medications   Medication Sig Start Date End Date Taking? Authorizing Provider  doxycycline (VIBRAMYCIN) 100 MG capsule Take 1 capsule (100 mg total) by mouth 2 (two) times daily. 01/08/23  Yes Karma Ansley K, PA-C  mupirocin ointment (BACTROBAN) 2 % Apply 1 Application topically 2 (two) times daily. 01/08/23  Yes Othello Sgroi, Noberto Retort, PA-C  oxyCODONE-acetaminophen (PERCOCET) 10-325 MG tablet Take 1 tablet by mouth 4 (four) times daily as needed. 12/23/22  Yes [provider]  predniSONE (DELTASONE) 20 MG tablet Take 20 mg by mouth daily.    [provider]  dicyclomine (BENTYL) 20 MG tablet Take 1 tablet (20 mg total) by mouth 2 (two) times daily. Patient not taking: Reported on 11/26/2015 10/12/15 11/26/15  Gilda Crease, MD  diphenhydrAMINE (BENADRYL) 25 MG tablet Take 1 tablet (25 mg total) by mouth every 4 (four) hours as needed for itching. Patient not taking: Reported on 04/25/2015 04/11/15 05/04/15  Triplett, Babette Relic, PA-C  hyoscyamine (LEVSIN SL) 0.125 MG SL tablet Place 1 tablet (0.125 mg total) under the tongue every 4 (four) hours as needed. Patient  not taking: Reported on 09/26/2015 06/04/15 10/12/15  Gelene Mink, NP  metoCLOPramide (REGLAN) 10 MG tablet Take 1 tablet (10 mg total) by mouth every 8 (eight) hours as needed for nausea or vomiting. Patient not taking: Reported on 11/26/2015 10/13/15 11/26/15  Loren Racer, MD  ranitidine (ZANTAC) 150 MG tablet Take 1 tablet (150 mg total) by mouth 2 (two) times daily. Patient not taking: Reported on 11/26/2015 10/12/15 11/26/15  Gilda Crease, MD    Family History Family History  Problem Relation Age of Onset   Cancer Mother    Diabetes Mother    Lupus Mother    Thyroid disease Mother    Colon cancer Father        AFTER AGE 40   Crohn's disease Sister     Social History Social History   Tobacco Use   Smoking status: Never   Smokeless tobacco: Never  Vaping Use   Vaping Use: Never used  Substance Use Topics   Alcohol use: No   Drug use: No     Allergies   Mango flavor, Penicillins, Oxycodone, Tramadol, Zofran [ondansetron hcl], Keflex [cephalexin], Sulfa antibiotics, and Toradol [ketorolac tromethamine]   Review of Systems Review of Systems  Constitutional:  Positive for activity change. Negative for appetite change, fatigue and fever.  Eyes:  Negative for visual disturbance.  Respiratory:  Negative for shortness of breath.   Cardiovascular:  Negative for chest pain.  Skin:  Positive for color change and wound.  Neurological:  Negative for dizziness, light-headedness and headaches.     Physical Exam Triage Vital Signs ED Triage Vitals  Enc Vitals Group     BP 01/08/23 1353 (!) 145/101     Pulse Rate 01/08/23 1353 84     Resp 01/08/23 1353 18     Temp 01/08/23 1353 98 F (36.7 C)     Temp Source 01/08/23 1353 Oral     SpO2 01/08/23 1353 98 %     Weight --      Height --      Head Circumference --      Peak Flow --      Pain Score 01/08/23 1356 6     Pain Loc --      Pain Edu? --      Excl. in GC? --    No data found.  Updated Vital Signs BP  (!) 139/101 (BP Location: Right Arm)   Pulse 84   Temp 98 F (36.7 C) (Oral)   Resp 18   SpO2 98%   Visual Acuity Right Eye Distance:  Left Eye Distance:   Bilateral Distance:    Right Eye Near:   Left Eye Near:    Bilateral Near:     Physical Exam Vitals reviewed.  Constitutional:      General: She is awake. She is not in acute distress.    Appearance: Normal appearance. She is well-developed. She is not ill-appearing.     Comments: Very pleasant female appears stated age in no acute distress sitting comfortably in exam room  HENT:     Head: Normocephalic and atraumatic.  Cardiovascular:     Rate and Rhythm: Normal rate and regular rhythm.     Heart sounds: Normal heart sounds, S1 normal and S2 normal. No murmur heard. Pulmonary:     Effort: Pulmonary effort is normal.     Breath sounds: Normal breath sounds. No wheezing, rhonchi or rales.     Comments: Clear to auscultation bilaterally Musculoskeletal:     Right lower leg: No edema.     Left lower leg: No edema.  Skin:    Findings: Erythema and wound present.     Comments: 5 cm x 5 cm erythematous lesion with central ulcerated wound.  No active bleeding or drainage.  No streaking or evidence of lymphangitis.  Psychiatric:        Behavior: Behavior is cooperative.      UC Treatments / Results  Labs (all labs ordered are listed, but only abnormal results are displayed) Labs Reviewed - No data to display  EKG   Radiology No results found.  Procedures Procedures (including critical care time)  Medications Ordered in UC Medications - No data to display  Initial Impression / Assessment and Plan / UC Course  I have reviewed the triage vital signs and the nursing notes.  Pertinent labs & imaging results that were available during my care of the patient were reviewed by me and considered in my medical decision making (see chart for details).     Patient is well-appearing, afebrile, nontoxic,  nontachycardic.  Concern for secondary infection given significance spread of erythema so will cover with doxycycline 100 mg twice daily for 7 days.  She was instructed to stay out of the sun while on this medication.  We discussed that is also possible she has an allergic component and so recommended that she use over-the-counter antihistamines as well as cool compresses to manage the symptoms.  If she has any worsening of the central ulcer, increasing wound, increasing redness, fever, nausea, vomiting she needs to be seen immediately.  Strict return precautions given to which she expressed understanding.  Patient's blood pressure was elevated today.  She reports that generally this is better controlled at home.  Denies any signs/symptoms of endorgan damage.  Did recommend that she monitor her blood pressure at home and avoid decongestants, caffeine, sodium, NSAIDs.  If her blood pressure is persistently above 140/90 she is to return here or see your primary care to consider initiation of medication.  If she has any chest pain, shortness of breath, headache, vision change, dizziness in the setting of high blood pressure she is to go to the emergency room immediately.  Final Clinical Impressions(s) / UC Diagnoses   Final diagnoses:  Insect bite of left knee, initial encounter  Skin infection  Elevated blood pressure reading without diagnosis of hypertension     Discharge Instructions      I believe that your redness is more of an allergic response so please continue hydrocortisone cream and use over-the-counter allergy  medicine such as cetirizine daily.  Given how quickly the redness has spread we are going to cover for bacterial infection.  Start doxycycline 100 mg twice daily for 7 days.  Stay out of the sun while on this medication.  Keep it clean with soap and water and apply Bactroban ointment with dressing changes.  Monitor the redness and if this continues to spread you should be reevaluated.   If anything worsens please return for reevaluation.  Your blood pressure is elevated.  Monitor this at home.  Avoid decongestants, caffeine, sodium, NSAIDs (aspirin, ibuprofen/Advil, naproxen/Aleve).  Follow-up with your primary care or our clinic if your blood pressure is consistently above 140/90.  If anything worsens and you have headache, shortness of breath, vision change, dizziness, chest pain in the setting of high blood pressure you need to go to the emergency room.     ED Prescriptions     Medication Sig Dispense Auth. Provider   doxycycline (VIBRAMYCIN) 100 MG capsule Take 1 capsule (100 mg total) by mouth 2 (two) times daily. 14 capsule Emeree Mahler K, PA-C   mupirocin ointment (BACTROBAN) 2 % Apply 1 Application topically 2 (two) times daily. 22 g Roiza Wiedel K, PA-C      PDMP not reviewed this encounter.   Jeani Hawking, PA-C 01/08/23 1453

## 2023-01-08 NOTE — Discharge Instructions (Signed)
I believe that your redness is more of an allergic response so please continue hydrocortisone cream and use over-the-counter allergy medicine such as cetirizine daily.  Given how quickly the redness has spread we are going to cover for bacterial infection.  Start doxycycline 100 mg twice daily for 7 days.  Stay out of the sun while on this medication.  Keep it clean with soap and water and apply Bactroban ointment with dressing changes.  Monitor the redness and if this continues to spread you should be reevaluated.  If anything worsens please return for reevaluation.  Your blood pressure is elevated.  Monitor this at home.  Avoid decongestants, caffeine, sodium, NSAIDs (aspirin, ibuprofen/Advil, naproxen/Aleve).  Follow-up with your primary care or our clinic if your blood pressure is consistently above 140/90.  If anything worsens and you have headache, shortness of breath, vision change, dizziness, chest pain in the setting of high blood pressure you need to go to the emergency room.

## 2023-01-19 DIAGNOSIS — Z79899 Other long term (current) drug therapy: Secondary | ICD-10-CM | POA: Diagnosis not present

## 2023-01-19 DIAGNOSIS — R030 Elevated blood-pressure reading, without diagnosis of hypertension: Secondary | ICD-10-CM | POA: Diagnosis not present

## 2023-01-19 DIAGNOSIS — M545 Low back pain, unspecified: Secondary | ICD-10-CM | POA: Diagnosis not present

## 2023-01-19 DIAGNOSIS — Z131 Encounter for screening for diabetes mellitus: Secondary | ICD-10-CM | POA: Diagnosis not present

## 2023-01-19 DIAGNOSIS — Z8719 Personal history of other diseases of the digestive system: Secondary | ICD-10-CM | POA: Diagnosis not present

## 2023-02-02 DIAGNOSIS — M5416 Radiculopathy, lumbar region: Secondary | ICD-10-CM | POA: Diagnosis not present

## 2023-02-11 DIAGNOSIS — R5383 Other fatigue: Secondary | ICD-10-CM | POA: Diagnosis not present

## 2023-02-11 DIAGNOSIS — Z131 Encounter for screening for diabetes mellitus: Secondary | ICD-10-CM | POA: Diagnosis not present

## 2023-02-11 DIAGNOSIS — Z79899 Other long term (current) drug therapy: Secondary | ICD-10-CM | POA: Diagnosis not present

## 2023-02-11 DIAGNOSIS — Z8719 Personal history of other diseases of the digestive system: Secondary | ICD-10-CM | POA: Diagnosis not present

## 2023-02-11 DIAGNOSIS — E612 Magnesium deficiency: Secondary | ICD-10-CM | POA: Diagnosis not present

## 2023-02-11 DIAGNOSIS — R030 Elevated blood-pressure reading, without diagnosis of hypertension: Secondary | ICD-10-CM | POA: Diagnosis not present

## 2023-02-11 DIAGNOSIS — E7800 Pure hypercholesterolemia, unspecified: Secondary | ICD-10-CM | POA: Diagnosis not present

## 2023-02-11 DIAGNOSIS — M545 Low back pain, unspecified: Secondary | ICD-10-CM | POA: Diagnosis not present

## 2023-02-11 DIAGNOSIS — E559 Vitamin D deficiency, unspecified: Secondary | ICD-10-CM | POA: Diagnosis not present

## 2023-03-08 DIAGNOSIS — R030 Elevated blood-pressure reading, without diagnosis of hypertension: Secondary | ICD-10-CM | POA: Diagnosis not present

## 2023-03-08 DIAGNOSIS — M545 Low back pain, unspecified: Secondary | ICD-10-CM | POA: Diagnosis not present

## 2023-03-08 DIAGNOSIS — Z79899 Other long term (current) drug therapy: Secondary | ICD-10-CM | POA: Diagnosis not present

## 2023-03-08 DIAGNOSIS — E559 Vitamin D deficiency, unspecified: Secondary | ICD-10-CM | POA: Diagnosis not present

## 2023-03-08 DIAGNOSIS — E876 Hypokalemia: Secondary | ICD-10-CM | POA: Diagnosis not present

## 2023-03-08 DIAGNOSIS — E038 Other specified hypothyroidism: Secondary | ICD-10-CM | POA: Diagnosis not present

## 2023-03-08 DIAGNOSIS — E781 Pure hyperglyceridemia: Secondary | ICD-10-CM | POA: Diagnosis not present

## 2023-03-10 DIAGNOSIS — Z79899 Other long term (current) drug therapy: Secondary | ICD-10-CM | POA: Diagnosis not present

## 2023-04-05 DIAGNOSIS — R030 Elevated blood-pressure reading, without diagnosis of hypertension: Secondary | ICD-10-CM | POA: Diagnosis not present

## 2023-04-05 DIAGNOSIS — Z79899 Other long term (current) drug therapy: Secondary | ICD-10-CM | POA: Diagnosis not present

## 2023-04-05 DIAGNOSIS — E038 Other specified hypothyroidism: Secondary | ICD-10-CM | POA: Diagnosis not present

## 2023-04-05 DIAGNOSIS — E559 Vitamin D deficiency, unspecified: Secondary | ICD-10-CM | POA: Diagnosis not present

## 2023-04-05 DIAGNOSIS — M545 Low back pain, unspecified: Secondary | ICD-10-CM | POA: Diagnosis not present

## 2023-05-11 DIAGNOSIS — M545 Low back pain, unspecified: Secondary | ICD-10-CM | POA: Diagnosis not present

## 2023-05-11 DIAGNOSIS — E038 Other specified hypothyroidism: Secondary | ICD-10-CM | POA: Diagnosis not present

## 2023-05-11 DIAGNOSIS — Z79899 Other long term (current) drug therapy: Secondary | ICD-10-CM | POA: Diagnosis not present

## 2023-05-11 DIAGNOSIS — R030 Elevated blood-pressure reading, without diagnosis of hypertension: Secondary | ICD-10-CM | POA: Diagnosis not present

## 2023-05-13 DIAGNOSIS — R059 Cough, unspecified: Secondary | ICD-10-CM | POA: Diagnosis not present

## 2023-05-13 DIAGNOSIS — R509 Fever, unspecified: Secondary | ICD-10-CM | POA: Diagnosis not present

## 2023-05-13 DIAGNOSIS — U071 COVID-19: Secondary | ICD-10-CM | POA: Diagnosis not present

## 2023-05-13 DIAGNOSIS — R5383 Other fatigue: Secondary | ICD-10-CM | POA: Diagnosis not present

## 2023-05-13 DIAGNOSIS — M791 Myalgia, unspecified site: Secondary | ICD-10-CM | POA: Diagnosis not present

## 2023-05-13 DIAGNOSIS — Z79899 Other long term (current) drug therapy: Secondary | ICD-10-CM | POA: Diagnosis not present

## 2023-06-02 DIAGNOSIS — M62830 Muscle spasm of back: Secondary | ICD-10-CM | POA: Diagnosis not present

## 2023-06-02 DIAGNOSIS — E038 Other specified hypothyroidism: Secondary | ICD-10-CM | POA: Diagnosis not present

## 2023-06-02 DIAGNOSIS — Z79899 Other long term (current) drug therapy: Secondary | ICD-10-CM | POA: Diagnosis not present

## 2023-06-02 DIAGNOSIS — M545 Low back pain, unspecified: Secondary | ICD-10-CM | POA: Diagnosis not present

## 2023-06-02 DIAGNOSIS — E7800 Pure hypercholesterolemia, unspecified: Secondary | ICD-10-CM | POA: Diagnosis not present

## 2023-06-02 DIAGNOSIS — Z23 Encounter for immunization: Secondary | ICD-10-CM | POA: Diagnosis not present

## 2023-06-02 DIAGNOSIS — M25551 Pain in right hip: Secondary | ICD-10-CM | POA: Diagnosis not present

## 2023-06-02 DIAGNOSIS — Z131 Encounter for screening for diabetes mellitus: Secondary | ICD-10-CM | POA: Diagnosis not present

## 2023-06-02 DIAGNOSIS — E559 Vitamin D deficiency, unspecified: Secondary | ICD-10-CM | POA: Diagnosis not present

## 2024-02-15 ENCOUNTER — Encounter: Payer: Self-pay | Admitting: Adult Health

## 2024-02-15 ENCOUNTER — Ambulatory Visit: Admitting: Adult Health

## 2024-02-29 ENCOUNTER — Other Ambulatory Visit (HOSPITAL_COMMUNITY): Payer: Self-pay | Admitting: Gastroenterology

## 2024-02-29 DIAGNOSIS — R1084 Generalized abdominal pain: Secondary | ICD-10-CM

## 2024-03-08 ENCOUNTER — Ambulatory Visit (HOSPITAL_COMMUNITY)
Admission: RE | Admit: 2024-03-08 | Discharge: 2024-03-08 | Disposition: A | Discharge status: Home / Self Care | Source: Ambulatory Visit | Attending: Gastroenterology | Admitting: Gastroenterology

## 2024-03-08 DIAGNOSIS — R1084 Generalized abdominal pain: Secondary | ICD-10-CM | POA: Insufficient documentation

## 2024-05-30 ENCOUNTER — Other Ambulatory Visit: Payer: Self-pay

## 2024-05-30 ENCOUNTER — Emergency Department (HOSPITAL_COMMUNITY)
Admission: EM | Admit: 2024-05-30 | Discharge: 2024-05-31 | Disposition: A | Discharge status: Home / Self Care | Attending: Emergency Medicine | Admitting: Emergency Medicine

## 2024-05-30 ENCOUNTER — Encounter (HOSPITAL_COMMUNITY): Payer: Self-pay

## 2024-05-30 DIAGNOSIS — Z8616 Personal history of COVID-19: Secondary | ICD-10-CM | POA: Diagnosis not present

## 2024-05-30 DIAGNOSIS — M549 Dorsalgia, unspecified: Secondary | ICD-10-CM | POA: Diagnosis present

## 2024-05-30 DIAGNOSIS — M5416 Radiculopathy, lumbar region: Secondary | ICD-10-CM | POA: Diagnosis not present

## 2024-05-30 NOTE — ED Triage Notes (Addendum)
 Pt to ED with c/o right sided lumbar pain x 2 days, reports hx of arthritis and back surgery in the past. Pt denies injury. Pt ambulatory.

## 2024-05-31 DIAGNOSIS — M5416 Radiculopathy, lumbar region: Secondary | ICD-10-CM | POA: Diagnosis not present

## 2024-05-31 LAB — URINALYSIS, ROUTINE W REFLEX MICROSCOPIC
Bacteria, UA: NONE SEEN
Bilirubin Urine: NEGATIVE
Glucose, UA: NEGATIVE mg/dL
Ketones, ur: NEGATIVE mg/dL
Leukocytes,Ua: NEGATIVE
Nitrite: NEGATIVE
Protein, ur: NEGATIVE mg/dL
Specific Gravity, Urine: 1.027 (ref 1.005–1.030)
pH: 5 (ref 5.0–8.0)

## 2024-05-31 MED ORDER — METHYLPREDNISOLONE 4 MG PO TBPK: ORAL_TABLET | ORAL | 0 refills | Status: DC

## 2024-05-31 MED ORDER — HYDROMORPHONE HCL 1 MG/ML IJ SOLN
1.0000 mg | Freq: Once | INTRAMUSCULAR | Status: AC
  Administered 2024-05-31: 1 mg via INTRAMUSCULAR
  Filled 2024-05-31: qty 1

## 2024-05-31 MED ORDER — DEXAMETHASONE SODIUM PHOSPHATE 10 MG/ML IJ SOLN
10.0000 mg | Freq: Once | INTRAMUSCULAR | Status: AC
  Administered 2024-05-31: 10 mg via INTRAMUSCULAR
  Filled 2024-05-31: qty 1

## 2024-05-31 NOTE — Discharge Instructions (Signed)
 You were seen today for back pain.  It is consistent with radicular pain.  Follow-up with your back surgeon.  Take the Medrol  Dosepak in addition to your home pain medication.  If you develop weakness, numbness, difficulty with your bowel or bladder you should be reevaluated.

## 2024-05-31 NOTE — ED Provider Notes (Signed)
 [place] EMERGENCY DEPARTMENT AT [facility] Provider Note   CSN: [id number] Arrival date & time: 05/30/24  2037     Patient presents with: Back Pain   Regina Patton is a 53 y.o. female.   HPI     This is a 53 year old female who presents with back pain.  History of chronic back pain and prior lumbar fusion.  She is on pain management.  States over the last several days she has had worsening back pain that radiates into the right lower extremity.  States she had 1 episode of incontinence.  No fevers.  No drug use.  Denies new injury.  Denies weakness, numbness, tingling of the lower extremities.  Prior to Admission medications   Medication Sig Start Date End Date Taking? Authorizing Provider  methylPREDNISolone  (MEDROL  DOSEPAK) 4 MG TBPK tablet Take as directed on packet 05/31/24  Yes Bhargav Barbaro, Charmaine FALCON, MD  doxycycline  (VIBRAMYCIN ) 100 MG capsule Take 1 capsule (100 mg total) by mouth 2 (two) times daily. 01/08/23   Raspet, Erin K, PA-C  levothyroxine (SYNTHROID) 50 MCG tablet Take 50 mcg by mouth. 01/11/24   [provider]  mupirocin  ointment (BACTROBAN ) 2 % Apply 1 Application topically 2 (two) times daily. 01/08/23   Raspet, Erin K, PA-C  oxyCODONE -acetaminophen  (PERCOCET) 10-325 MG tablet Take 1 tablet by mouth 4 (four) times daily as needed. 12/23/22   [provider]  predniSONE  (DELTASONE ) 20 MG tablet Take 20 mg by mouth daily.    [provider]  dicyclomine  (BENTYL ) 20 MG tablet Take 1 tablet (20 mg total) by mouth 2 (two) times daily. Patient not taking: Reported on 11/26/2015 10/12/15 11/26/15  Haze Lonni PARAS, MD  diphenhydrAMINE  (BENADRYL ) 25 MG tablet Take 1 tablet (25 mg total) by mouth every 4 (four) hours as needed for itching. Patient not taking: Reported on 04/25/2015 04/11/15 05/04/15  Triplett, Madelin, PA-C  hyoscyamine  (LEVSIN  SL) 0.125 MG SL tablet Place 1 tablet (0.125 mg total) under the tongue every 4 (four) hours as  needed. Patient not taking: Reported on 09/26/2015 06/04/15 10/12/15  Shirlean Therisa ORN, NP  metoCLOPramide  (REGLAN ) 10 MG tablet Take 1 tablet (10 mg total) by mouth every 8 (eight) hours as needed for nausea or vomiting. Patient not taking: Reported on 11/26/2015 10/13/15 11/26/15  Carlyle Lenis, MD  ranitidine  (ZANTAC ) 150 MG tablet Take 1 tablet (150 mg total) by mouth 2 (two) times daily. Patient not taking: Reported on 11/26/2015 10/12/15 11/26/15  Haze Lonni PARAS, MD    Allergies: Mango flavoring agent (non-screening), Penicillins, Oxycodone , Tramadol, Zofran  [ondansetron  hcl], Keflex  [cephalexin ], Sulfa  antibiotics, and Toradol  [ketorolac  tromethamine ]    Review of Systems  Constitutional:  Negative for fever.  Genitourinary:  Positive for difficulty urinating.  Musculoskeletal:  Positive for back pain.  Neurological:  Negative for weakness and numbness.  All other systems reviewed and are negative.   Updated Vital Signs BP (!) 149/88 (BP Location: Right Arm)   Pulse 95   Temp 98.2 F (36.8 C)   Resp (!) 22   Ht 1.524 m (5')   Wt 86.6 kg   SpO2 99%   BMI 37.30 kg/m   Physical Exam Vitals and nursing note reviewed.  Constitutional:      Appearance: She is well-developed. She is obese. She is not ill-appearing.  HENT:     Head: Normocephalic and atraumatic.  Eyes:     Pupils: Pupils are equal, round, and reactive to light.  Cardiovascular:  Rate and Rhythm: Normal rate and regular rhythm.  Pulmonary:     Effort: Pulmonary effort is normal. No respiratory distress.  Abdominal:     Palpations: Abdomen is soft.  Musculoskeletal:     Cervical back: Neck supple.     Comments: Tenderness to palpation lower lumbar spine and right SI joint, positive straight leg raise on the right  Skin:    General: Skin is warm and dry.  Neurological:     Mental Status: She is alert and oriented to person, place, and time.     Comments: 5 out of 5 strength bilateral lower extremities,  no clonus, normal and equal patellar reflexes  Psychiatric:        Mood and Affect: Mood normal.     (all labs ordered are listed, but only abnormal results are displayed) Labs Reviewed  URINALYSIS, ROUTINE W REFLEX MICROSCOPIC - Abnormal; Notable for the following components:      Result Value   Hgb urine dipstick MODERATE (*)    All other components within normal limits    EKG: None  Radiology: No results found.   Procedures   Medications Ordered in the ED  dexamethasone  (DECADRON ) injection 10 mg (10 mg Intramuscular Given 05/31/24 0035)  HYDROmorphone  (DILAUDID ) injection 1 mg (1 mg Intramuscular Given 05/31/24 0036)                                    Medical Decision Making Amount and/or Complexity of Data Reviewed Labs: ordered.  Risk Prescription drug management.   This patient presents to the ED for concern of back pain, this involves an extensive number of treatment options, and is a complaint that carries with it a high risk of complications and morbidity.  I considered the following differential and admission for this acute, potentially life threatening condition.  The differential diagnosis includes sciatica, musculoskeletal spasm, intraspinal lesion such as mass or infection  MDM:    This is a 53 year old female with prior back surgery history who presents with 2 days of back pain.  History suggestive of radicular pain.  Does report 1 episode of incontinence.  She was able to urinate spontaneously and had 100 cc postvoid residual which is appropriate for adequate voiding.  She is neurologically intact.  No signs or symptoms of cauda equina.  She was given a dose of Decadron  and Dilaudid  with significant improvement of her symptoms.  She is afebrile.  Low suspicion for epidural abscess or intraspinal lesion at this time.  Recommend follow-up with her primary surgeon.  Will discharge with Medrol  Dosepak.  She has pain medication at home.  (Labs, imaging,  consults)  Labs: I Ordered, and personally interpreted labs.  The pertinent results include: Urinalysis  Imaging Studies ordered: I ordered imaging studies including none I independently visualized and interpreted imaging. I agree with the radiologist interpretation  Additional history obtained from chart review.  External records from outside source obtained and reviewed including prior evaluations  Cardiac Monitoring: The patient was maintained on a cardiac monitor.  If on the cardiac monitor, I personally viewed and interpreted the cardiac monitored which showed an underlying rhythm of: Sinus  Reevaluation: After the interventions noted above, I reevaluated the patient and found that they have :improved  Social Determinants of Health:  lives independently  Disposition: Discharge  Co morbidities that complicate the patient evaluation  Past Medical History:  Diagnosis Date   C.  difficile diarrhea    Cervical radiculopathy    Chronic abdominal pain    Chronic back pain    Chronic knee pain    Chronic neck pain    COVID 11/2021   mild   Depression    Fibromyalgia    GERD (gastroesophageal reflux disease)    Pneumonia    Pre-diabetes    Rheumatoid arthritis (HCC)    Rhematoid factor positive but no symptoms per EPIC notes 2012   Rib pain on right side    chronic   Sciatic pain    right     Medicines Meds ordered this encounter  Medications   dexamethasone  (DECADRON ) injection 10 mg   HYDROmorphone  (DILAUDID ) injection 1 mg   methylPREDNISolone  (MEDROL  DOSEPAK) 4 MG TBPK tablet    Sig: Take as directed on packet    Dispense:  21 tablet    Refill:  0    I have reviewed the patients home medicines and have made adjustments as needed  Problem List / ED Course: Problem List Items Addressed This Visit   None Visit Diagnoses       Lumbar radicular pain    -  Primary   Relevant Medications   dexamethasone  (DECADRON ) injection 10 mg (Completed)    HYDROmorphone  (DILAUDID ) injection 1 mg (Completed)   methylPREDNISolone  (MEDROL  DOSEPAK) 4 MG TBPK tablet                Final diagnoses:  Lumbar radicular pain    ED Discharge Orders          Ordered    methylPREDNISolone  (MEDROL  DOSEPAK) 4 MG TBPK tablet        05/31/24 0155               Oluwatobi Visser, Charmaine FALCON, MD 05/31/24 0205

## 2024-06-21 ENCOUNTER — Encounter: Payer: Self-pay | Admitting: Gastroenterology

## 2024-07-25 ENCOUNTER — Ambulatory Visit (INDEPENDENT_AMBULATORY_CARE_PROVIDER_SITE_OTHER): Admitting: Gastroenterology

## 2024-07-25 ENCOUNTER — Encounter: Payer: Self-pay | Admitting: Gastroenterology

## 2024-07-25 VITALS — BP 111/77 | HR 82 | Temp 98.6°F | Ht 60.0 in | Wt 197.6 lb

## 2024-07-25 DIAGNOSIS — K5900 Constipation, unspecified: Secondary | ICD-10-CM

## 2024-07-25 DIAGNOSIS — K760 Fatty (change of) liver, not elsewhere classified: Secondary | ICD-10-CM | POA: Diagnosis not present

## 2024-07-25 DIAGNOSIS — K529 Noninfective gastroenteritis and colitis, unspecified: Secondary | ICD-10-CM | POA: Insufficient documentation

## 2024-07-25 DIAGNOSIS — R1319 Other dysphagia: Secondary | ICD-10-CM

## 2024-07-25 DIAGNOSIS — R7689 Other specified abnormal immunological findings in serum: Secondary | ICD-10-CM | POA: Insufficient documentation

## 2024-07-25 DIAGNOSIS — K649 Unspecified hemorrhoids: Secondary | ICD-10-CM

## 2024-07-25 DIAGNOSIS — R1310 Dysphagia, unspecified: Secondary | ICD-10-CM | POA: Insufficient documentation

## 2024-07-25 DIAGNOSIS — K219 Gastro-esophageal reflux disease without esophagitis: Secondary | ICD-10-CM

## 2024-07-25 MED ORDER — PANTOPRAZOLE SODIUM 40 MG PO TBEC: 40.0000 mg | DELAYED_RELEASE_TABLET | Freq: Every day | ORAL | 3 refills | Status: AC

## 2024-07-25 NOTE — Patient Instructions (Signed)
 We will review your records and decide next step in your work up. You will definitely need an upper endoscopy and labs to evaluate for inactive Hepatitis B.  We will switch your omeprazole to pantoprazole  40mg  daily before breakfast.

## 2024-07-25 NOTE — Progress Notes (Signed)
 GI Office Note    Referring Provider: Aldo Pellet, FNP Primary Care Physician:  Aldo Pellet, FNP  Primary Gastroenterologist: Carlin POUR. Cindie, DO   Chief Complaint   Chief Complaint  Patient presents with   Constipation    Was having issues with diarrhea for about a year and a half, but for the past three weeks has been having issues with constipation. Had a colonoscopy in July with [facility] in [place].     History of Present Illness   Regina Patton is a 53 y.o. female presenting today at the request of Candace McFail FNP for personal history of colon polyps, GERD, chronic diarrhea.      Discussed the use of AI scribe software for clinical note transcription with the patient, who gave verbal consent to proceed.  History of Present Illness Regina Patton is a 53 year old female with a history of colon polyps who presents with recent onset constipation following two years of chronic diarrhea.  She experienced chronic diarrhea for almost two years, which abruptly resolved approximately two weeks ago. During this period, she had frequent, urgent bowel movements that significantly impacted her daily activities, requiring her to plan her day around bathroom access. Meals exacerbated her symptoms. She attempted various treatments, including Lomotil  and a two-week course of a medication likely Xifaxan, but these did not alleviate her symptoms. Describes trying blue capsule, probably bentyl , and did not work. She also used liquid IV to manage dehydration. Stool tests were conducted, but she has not received the results. A colonoscopy was performed in July 2025, but she has not received the results due to staffing issues at the gastrointestinal department. She had colonoscopy done by GI at [facility] as arranged by her PCP (also [facility]).   Since the resolution of diarrhea, she has experienced constipation, with bowel movements occurring  every four to five days, described as [place] 1 small, hard stools. She notes the presence of blood on the tissue. No recent changes in medication that could account for the change in bowel habits. She is currently taking oxycodone  10 mg for chronic pain, which she has been on for two years without prior issues of constipation.  She has a history of ischemic colitis diagnosed approximately 10-12 years ago, discovered following an episode where she passed out on the toilet. She has had a previous colonoscopy at that time.  She reports a history of gastroesophageal reflux disease (GERD) for over five years, with symptoms of heartburn that sometimes require her to take two doses of omeprazole 40 mg daily. She experiences occasional dysphagia with both solids and liquids, and sometimes vomits due to severe heartburn. Her weight has been fluctuating between 182 and 197 pounds.  Her family history is significant for her father having died of colon cancer at the age of 21.    She has positive Hep B core total Ab. She has not been sexually active in 8 years, her husband has been in prison. She states he was not diagnosed with Hep B. Her Hep B surf antigen is negative. HCV Ab negative.   Currently on Levaquin, getting over URI infection.    Prior Data   Results    April 20, 2024: Hepatitis B core antibody total reactive, hepatitis B surface antigen nonreactive, hepatitis C antibody nonreactive,, sodium 139, potassium 4.6, BUN 12, creatinine 0.86, albumin 4.5, total bilirubin 0.5, alk phos 47, AST 19, ALT 16, sed rate 4, CRP  less than 5, white blood cell count 6, hemoglobin 15.6, platelets 215   Abd u/s 03/08/24: IMPRESSION: 1. Hepatic steatosis. 2. Dilated common bile duct is likely due to reservoir effect status post cholecystectomy. Recommend correlation with LFTs.     Medications   Current Outpatient Medications  Medication Sig Dispense Refill   ipratropium-albuterol  (DUONEB) 0.5-2.5 (3)  MG/3ML SOLN Take 3 mLs by nebulization every 6 (six) hours as needed.     levofloxacin (LEVAQUIN) 750 MG tablet Take 750 mg by mouth daily.     levothyroxine (SYNTHROID) 50 MCG tablet Take 50 mcg by mouth.     methocarbamol  (ROBAXIN ) 500 MG tablet Take 500 mg by mouth 2 (two) times daily.     mupirocin  ointment (BACTROBAN ) 2 % Apply 1 Application topically 2 (two) times daily. 22 g 0   omeprazole (PRILOSEC) 40 MG capsule Take 40 mg by mouth daily.     oxyCODONE -acetaminophen  (PERCOCET) 10-325 MG tablet Take 1 tablet by mouth 4 (four) times daily as needed.     pregabalin (LYRICA) 75 MG capsule Take 75 mg by mouth 2 (two) times daily.     No current facility-administered medications for this visit.    Allergies   Allergies as of 07/25/2024 - Review Complete 07/25/2024  Allergen Reaction Noted   Flavoring agent Anaphylaxis and Swelling 02/23/2015   Mango flavoring agent (non-screening) Anaphylaxis and Swelling 02/23/2015   Penicillins Anaphylaxis and Hives 09/12/2013   Oxycodone  Hives 10/22/2021   Sulfur  07/25/2024   Tramadol Nausea And Vomiting 08/08/2014   Zofran  [ondansetron  hcl] Other (See Comments) 10/18/2015   Keflex  [cephalexin ] Hives, Itching, and Rash 04/13/2015   Sulfa  antibiotics Itching and Rash 02/05/2022   Toradol  [ketorolac  tromethamine ] Rash 08/08/2014    Past Medical History   Past Medical History:  Diagnosis Date   C. difficile diarrhea    Cervical radiculopathy    Chronic abdominal pain    Chronic back pain    Chronic knee pain    Chronic neck pain    COVID 11/2021   mild   Depression    Fibromyalgia    GERD (gastroesophageal reflux disease)    Hypothyroidism    Pneumonia    Pre-diabetes    Rheumatoid arthritis (HCC)    Rhematoid factor positive but no symptoms per EPIC notes 2012   Rib pain on right side    chronic   Sciatic pain    right    Past Surgical History   Past Surgical History:  Procedure Laterality Date   BACK SURGERY      BLADDER SURGERY     CHOLECYSTECTOMY N/A 10/18/2015   Procedure: LAPAROSCOPIC CHOLECYSTECTOMY;  Surgeon: Oneil Budge, MD;  Location: [facility];  Service: General;  Laterality: N/A;   EXCISION, ZENKER'S DIVERTICULUM  2021   TUBAL LIGATION     URETHRAL DIVERTICULECTOMY      Past Family History   Family History  Problem Relation Age of Onset   Cancer Mother    Diabetes Mother    Lupus Mother    Thyroid  disease Mother    Colon cancer Father        AFTER AGE 3   Crohn's disease Sister     Past Social History   Social History   Socioeconomic History   Marital status: Divorced    Spouse name: Not on file   Number of children: Not on file   Years of education: Not on file   Highest education level: Not on file  Occupational History  Not on file  Tobacco Use   Smoking status: Never   Smokeless tobacco: Never  Vaping Use   Vaping status: Never Used  Substance and Sexual Activity   Alcohol use: No   Drug use: No   Sexual activity: Yes    Birth control/protection: Surgical  Other Topics Concern   Not on file  Social History Narrative   Not on file   Social Drivers of Health   Financial Resource Strain: Low Risk (01/02/2021)   Received from [facility]   Overall Financial Resource Strain (CARDIA)    Difficulty of Paying Living Expenses: Not hard at all  Food Insecurity: No Food Insecurity (01/02/2021)   Received from [facility]   Hunger Vital Sign    Within the past 12 months, you worried that your food would run out before you got the money to buy more.: Never true    Within the past 12 months, the food you bought just didn't last and you didn't have money to get more.: Never true  Transportation Needs: No Transportation Needs (01/02/2021)   Received from [facility]   PRAPARE - Transportation    Lack of Transportation (Medical): No    Lack of Transportation (Non-Medical): No  Physical Activity: Insufficiently Active (01/02/2021)   Received from [facility]   Exercise Vital Sign    On average, how many days per week do you engage in moderate to strenuous exercise (like a brisk walk)?: 4 days    On average, how many minutes do you engage in exercise at this level?: 30 min  Stress: Stress Concern Present (01/02/2021)   Received from [facility] - Occupational Stress Questionnaire    Feeling of Stress : To some extent  Social Connections: Moderately Isolated (01/02/2021)   Received from [facility]   Social Connection and Isolation Panel    In a typical week, how many times do you talk on the phone with family, friends, or neighbors?: Once a week    How often do you get together with friends or relatives?: Once a week    How often do you attend church or religious services?: 1 to 4 times per year    Active Member of Clubs or Organizations: Not on file    How often do you attend meetings of the clubs or organizations you belong to?: 1 to 4 times per year    Are you married, widowed, divorced, separated, never married, or living with a partner?: Divorced  Intimate Partner Violence: Not At Risk (01/02/2021)   Received from [facility]   Humiliation, Afraid, Rape, and Kick questionnaire    Within the last year, have you been afraid of your partner or ex-partner?: No    Within the last year, have you been humiliated or emotionally abused in other ways by your partner or ex-partner?: No    Within the last year, have you been kicked, hit, slapped, or otherwise physically hurt by your partner or ex-partner?: No    Within the last year, have you been raped or forced to have any kind of sexual activity by your partner or ex-partner?: No    Review of Systems   General: Negative for anorexia, weight loss, fever, chills, fatigue, weakness. Eyes: Negative for vision changes.  ENT: Negative for hoarseness, difficulty swallowing , nasal congestion.see hpi CV: Negative for chest pain, angina,  palpitations, dyspnea on exertion, peripheral edema.  Respiratory:  Negative for dyspnea at rest, dyspnea on exertion, +cough, sputum, +wheezing.  GI: See history of present illness. GU:  Negative for dysuria, hematuria, urinary incontinence, urinary frequency, nocturnal urination.  MS: Negative for joint pain, low back pain.  Derm: Negative for rash or itching.  Neuro: Negative for weakness, abnormal sensation, seizure, frequent headaches, memory loss,  confusion.  Psych: Negative for anxiety, depression, suicidal ideation, hallucinations.  Endo: Negative for unusual weight change.  Heme: Negative for bruising or bleeding. Allergy: Negative for rash or hives.  Physical Exam   BP 111/77   Pulse 82   Temp 98.6 F (37 C) (Oral)   Ht 5' (1.524 m)   Wt 197 lb 9.6 oz (89.6 kg)   LMP  (LMP Unknown)   SpO2 97%   BMI 38.59 kg/m    General: Well-nourished, well-developed in no acute distress.  Head: Normocephalic, atraumatic.   Eyes: Conjunctiva pink, no icterus. Mouth: Oropharyngeal mucosa moist and pink  Neck: Supple without thyromegaly, masses, or lymphadenopathy.  Lungs: scattered wheezing Heart: Regular rate and rhythm, no murmurs rubs or gallops.  Abdomen: Bowel sounds are normal, nontender, nondistended, no hepatosplenomegaly or masses,  no abdominal bruits or hernia, no rebound or guarding.   Rectal: not performed Extremities: No lower extremity edema. No clubbing or deformities.  Neuro: Alert and oriented x 4 , grossly normal neurologically.  Skin: Warm and dry, no rash or jaundice.   Psych: Alert and cooperative, normal mood and affect.  Labs   See above  Imaging Studies   No results found.  Assessment/Plan:    Assessment & Plan Chronic diarrhea: Chronic diarrhea resolved; new constipation with infrequent bowel movements. Colonoscopy has been completed but she is unaware of results. States GI group at [facility] has lost several providers and she  prefers seeing us  anyway.  - Requested records from [facility], including colonoscopy and stool test results.  Gastroesophageal reflux disease with dysphagia GERD with dysphagia, severe heartburn, occasional vomiting, and choking. Omeprazole insufficient. - Switched to pantoprazole  40 mg once daily before breakfast. -reinforced anti-reflux measures - Plan endoscopy to evaluate GERD and dysphagia but await colonoscopy records for review  Fatty liver (hepatic steatosis) Fatty liver identified on ultrasound.  - Requested records from [facility] to review ultrasound and labs. - Further recommendations to follow.  Hemorrhoids Hemorrhoids with possible irritation causing blood on tissue.  Hepatitis B core total antibody positive, patient unaware Hepatitis B surface antigen negative, indicating no active infection. Possible false positive due to autoimmune condition, current or past infection. Need additional labs. - future labs to sort out if she has current or past infection once all records reviewed      Sonny RAMAN. Ezzard, MHS, PA-C [facility]

## 2024-09-09 ENCOUNTER — Telehealth: Payer: Self-pay | Admitting: Gastroenterology

## 2024-09-09 NOTE — Telephone Encounter (Signed)
 Please let pat know we received records from [facility].  Her colonoscopy was 03/2024. Prep was not great, used Suprep.she had random colon biopsies negative for colitis or cause of previous diarrhea. She has single small tubular adenoma. Advised follow up colonoscopy in 03/2027 primarily due to her prep previously not being ideal.   -->please NIC for colonoscopy 03/2027 (consider two day bowel prep) -->OK to schedule EGD/ED for GERD and dysphagia. ASA 3, rm 1,2 ok. [place] -->needs liver labs for fatty liver and positive hep b core total ab LFTs CBC Hep B core total Ab Hep B surface antigen Hep B surface antibody HBV DNA

## 2024-09-11 ENCOUNTER — Other Ambulatory Visit: Payer: Self-pay

## 2024-09-11 DIAGNOSIS — R7689 Other specified abnormal immunological findings in serum: Secondary | ICD-10-CM

## 2024-09-11 NOTE — Telephone Encounter (Signed)
 Pt was made aware and verbalized understanding. Pt is ready to move forward with scheduling. Labs have been ordered and pt has been instructed to have completed asap.

## 2024-09-12 NOTE — Telephone Encounter (Signed)
 LMOVM to call back  Mandy, please NIC TCS as stated below

## 2024-09-13 LAB — CBC WITH DIFFERENTIAL/PLATELET
Basophils Absolute: 0 x10E3/uL (ref 0.0–0.2)
Basos: 0 %
EOS (ABSOLUTE): 0.3 x10E3/uL (ref 0.0–0.4)
Eos: 4 %
Hematocrit: 43.8 % (ref 34.0–46.6)
Hemoglobin: 14.9 g/dL (ref 11.1–15.9)
Immature Grans (Abs): 0 x10E3/uL (ref 0.0–0.1)
Immature Granulocytes: 0 %
Lymphocytes Absolute: 3.5 x10E3/uL — ABNORMAL HIGH (ref 0.7–3.1)
Lymphs: 46 %
MCH: 32.5 pg (ref 26.6–33.0)
MCHC: 34 g/dL (ref 31.5–35.7)
MCV: 96 fL (ref 79–97)
Monocytes Absolute: 0.5 x10E3/uL (ref 0.1–0.9)
Monocytes: 6 %
Neutrophils Absolute: 3.3 x10E3/uL (ref 1.4–7.0)
Neutrophils: 44 %
Platelets: 233 x10E3/uL (ref 150–450)
RBC: 4.58 x10E6/uL (ref 3.77–5.28)
RDW: 12.9 % (ref 11.7–15.4)
WBC: 7.6 x10E3/uL (ref 3.4–10.8)

## 2024-09-13 LAB — HEPATITIS B CORE ANTIBODY, TOTAL: Hep B Core Total Ab: POSITIVE — AB

## 2024-09-13 LAB — HEPATIC FUNCTION PANEL
ALT: 24 IU/L (ref 0–32)
AST: 22 IU/L (ref 0–40)
Albumin: 4.1 g/dL (ref 3.8–4.9)
Alkaline Phosphatase: 70 IU/L (ref 49–135)
Bilirubin Total: 0.2 mg/dL (ref 0.0–1.2)
Bilirubin, Direct: 0.1 mg/dL (ref 0.00–0.40)
Total Protein: 6.6 g/dL (ref 6.0–8.5)

## 2024-09-13 LAB — HEPATITIS B DNA, ULTRAQUANTITATIVE, PCR: HBV DNA SERPL PCR-ACNC: NOT DETECTED [IU]/mL

## 2024-09-13 LAB — HEPATITIS B SURFACE ANTIGEN: Hepatitis B Surface Ag: NEGATIVE

## 2024-09-13 LAB — HEPATITIS B SURFACE ANTIBODY,QUALITATIVE

## 2024-09-13 NOTE — Telephone Encounter (Addendum)
 Spoke with pt. She has been scheduled for 1/13 with Dr. Cindie. Aware will send instructions to her mychart. Gave info to get back in. Also discussed on the telephone.    PA approved via cohere Authorization #[id number] DOS: 09/19/2024 - 11/17/2024

## 2024-09-15 ENCOUNTER — Other Ambulatory Visit: Payer: Self-pay

## 2024-09-15 ENCOUNTER — Encounter (HOSPITAL_COMMUNITY): Payer: Self-pay

## 2024-09-15 ENCOUNTER — Encounter (HOSPITAL_COMMUNITY)
Admission: RE | Admit: 2024-09-15 | Discharge: 2024-09-15 | Disposition: A | Source: Ambulatory Visit | Attending: Internal Medicine

## 2024-09-19 ENCOUNTER — Encounter (HOSPITAL_COMMUNITY): Payer: Self-pay | Admitting: Internal Medicine

## 2024-09-19 ENCOUNTER — Encounter (HOSPITAL_COMMUNITY)
Admission: RE | Disposition: A | Discharge status: Home / Self Care | Payer: Self-pay | Source: Home / Self Care | Attending: Internal Medicine

## 2024-09-19 ENCOUNTER — Ambulatory Visit (HOSPITAL_COMMUNITY)
Admission: RE | Admit: 2024-09-19 | Discharge: 2024-09-19 | Disposition: A | Discharge status: Home / Self Care | Attending: Internal Medicine | Admitting: Internal Medicine

## 2024-09-19 ENCOUNTER — Other Ambulatory Visit: Payer: Self-pay

## 2024-09-19 ENCOUNTER — Encounter (HOSPITAL_COMMUNITY): Payer: Self-pay | Admitting: Anesthesiology

## 2024-09-19 ENCOUNTER — Ambulatory Visit (HOSPITAL_COMMUNITY): Payer: Self-pay | Admitting: Anesthesiology

## 2024-09-19 DIAGNOSIS — R1310 Dysphagia, unspecified: Secondary | ICD-10-CM | POA: Diagnosis not present

## 2024-09-19 DIAGNOSIS — Z6839 Body mass index (BMI) 39.0-39.9, adult: Secondary | ICD-10-CM | POA: Insufficient documentation

## 2024-09-19 DIAGNOSIS — E039 Hypothyroidism, unspecified: Secondary | ICD-10-CM | POA: Diagnosis not present

## 2024-09-19 DIAGNOSIS — K2961 Other gastritis with bleeding: Secondary | ICD-10-CM

## 2024-09-19 DIAGNOSIS — K31A14 Gastric intestinal metaplasia without dysplasia, involving the cardia: Secondary | ICD-10-CM | POA: Insufficient documentation

## 2024-09-19 DIAGNOSIS — K2289 Other specified disease of esophagus: Secondary | ICD-10-CM

## 2024-09-19 DIAGNOSIS — K2100 Gastro-esophageal reflux disease with esophagitis, without bleeding: Secondary | ICD-10-CM | POA: Insufficient documentation

## 2024-09-19 DIAGNOSIS — K219 Gastro-esophageal reflux disease without esophagitis: Secondary | ICD-10-CM | POA: Diagnosis not present

## 2024-09-19 DIAGNOSIS — K2970 Gastritis, unspecified, without bleeding: Secondary | ICD-10-CM | POA: Insufficient documentation

## 2024-09-19 DIAGNOSIS — E66813 Obesity, class 3: Secondary | ICD-10-CM | POA: Diagnosis not present

## 2024-09-19 DIAGNOSIS — I10 Essential (primary) hypertension: Secondary | ICD-10-CM | POA: Diagnosis not present

## 2024-09-19 HISTORY — PX: ESOPHAGEAL DILATION: SHX303

## 2024-09-19 HISTORY — PX: ESOPHAGOGASTRODUODENOSCOPY: SHX5428

## 2024-09-19 LAB — GLUCOSE, CAPILLARY: Glucose-Capillary: 106 mg/dL — ABNORMAL HIGH (ref 70–99)

## 2024-09-19 MED ORDER — PROPOFOL 500 MG/50ML IV EMUL
INTRAVENOUS | Status: DC | PRN
  Administered 2024-09-19: 200 mg via INTRAVENOUS

## 2024-09-19 MED ORDER — LACTATED RINGERS IV SOLN: INTRAVENOUS | Status: DC

## 2024-09-19 NOTE — Transfer of Care (Signed)
 Immediate Anesthesia Transfer of Care Note  Patient: Regina Patton  Procedure(s) Performed: EGD (ESOPHAGOGASTRODUODENOSCOPY) DILATION, ESOPHAGUS  Patient Location: Short Stay  Anesthesia Type:MAC  Level of Consciousness: awake and patient cooperative  Airway & Oxygen Therapy: Patient Spontanous Breathing  Post-op Assessment: Report given to RN and Post -op Vital signs reviewed and stable  Post vital signs: Reviewed and stable  Last Vitals:  Vitals Value Taken Time  BP 85/61 09/19/24 08:20  Temp 36.9 C 09/19/24 08:20  Pulse 74 09/19/24 08:20  Resp 15 09/19/24 08:20  SpO2 97 % 09/19/24 08:20    Last Pain:  Vitals:   09/19/24 0820  TempSrc: Axillary  PainSc: 8          Complications: No notable events documented.

## 2024-09-19 NOTE — Discharge Instructions (Addendum)
 EGD Discharge instructions Please read the instructions outlined below and refer to this sheet in the next few weeks. These discharge instructions provide you with general information on caring for yourself after you leave the hospital. Your doctor may also give you specific instructions. While your treatment has been planned according to the most current medical practices available, unavoidable complications occasionally occur. If you have any problems or questions after discharge, please call your doctor. ACTIVITY You may resume your regular activity but move at a slower pace for the next 24 hours.  Take frequent rest periods for the next 24 hours.  Walking will help expel (get rid of) the air and reduce the bloated feeling in your abdomen.  No driving for 24 hours (because of the anesthesia (medicine) used during the test).  You may shower.  Do not sign any important legal documents or operate any machinery for 24 hours (because of the anesthesia used during the test).  NUTRITION Drink plenty of fluids.  You may resume your normal diet.  Begin with a light meal and progress to your normal diet.  Avoid alcoholic beverages for 24 hours or as instructed by your caregiver.  MEDICATIONS You may resume your normal medications unless your caregiver tells you otherwise.  WHAT YOU CAN EXPECT TODAY You may experience abdominal discomfort such as a feeling of fullness or gas pains.  FOLLOW-UP Your doctor will discuss the results of your test with you.  SEEK IMMEDIATE MEDICAL ATTENTION IF ANY OF THE FOLLOWING OCCUR: Excessive nausea (feeling sick to your stomach) and/or vomiting.  Severe abdominal pain and distention (swelling).  Trouble swallowing.  Temperature over 101 F (37.8 C).  Rectal bleeding or vomiting of blood.   Your EGD revealed mild amount inflammation in your stomach.  I took biopsies of this to rule out infection with a bacteria called H. pylori.  I also took samples of your  esophagus.  Await pathology results, my office will contact you.  Your esophagus was mildly tight.  I did stretch this out today.  Hopefully this helps with feeling of food getting stuck.  Small bowel was normal.  Continue on pantoprazole  daily.  Follow-up in GI office in 3 months.  Office will notify you.  I hope you have a great rest of your week!  Regina Patton. Cindie, D.O. Gastroenterology and Hepatology [facility]

## 2024-09-19 NOTE — H&P (Signed)
 Primary Care Physician:  Aldo Pellet, FNP Primary Gastroenterologist:  Dr. Cindie  Pre-Procedure History & Physical: HPI:  Regina Patton is a 54 y.o. female is here for an EGD with possible dilation due to history of dysphagia, GERD  Past Medical History:  Diagnosis Date   C. difficile diarrhea    Cervical radiculopathy    Chronic abdominal pain    Chronic back pain    Chronic knee pain    Chronic neck pain    COVID 11/2021   mild   Depression    Fibromyalgia    GERD (gastroesophageal reflux disease)    Hypothyroidism    Pneumonia    Pre-diabetes    Rheumatoid arthritis (HCC)    Rhematoid factor positive but no symptoms per EPIC notes 2012   Rib pain on right side    chronic   Sciatic pain    right    Past Surgical History:  Procedure Laterality Date   BACK SURGERY     x2   BLADDER SURGERY     CHOLECYSTECTOMY N/A 10/18/2015   Procedure: LAPAROSCOPIC CHOLECYSTECTOMY;  Surgeon: Oneil Budge, MD;  Location: [facility];  Service: General;  Laterality: N/A;   EXCISION, ZENKER'S DIVERTICULUM  2021   TUBAL LIGATION     URETHRAL DIVERTICULECTOMY      Prior to Admission medications  Medication Sig Start Date End Date Taking? Authorizing Provider  ESTROGENS CONJ SYNTHETIC A PO Take by mouth.   Yes [provider]  levothyroxine (SYNTHROID) 50 MCG tablet Take 50 mcg by mouth. 01/11/24  Yes [provider]  methocarbamol  (ROBAXIN ) 500 MG tablet Take 500 mg by mouth 2 (two) times daily.   Yes [provider]  mupirocin  ointment (BACTROBAN ) 2 % Apply 1 Application topically 2 (two) times daily. 01/08/23  Yes Raspet, Erin K, PA-C  Oxycodone  HCl 10 MG TABS Take 10 mg by mouth in the morning, at noon, in the evening, and at bedtime.   Yes [provider]  oxyCODONE -acetaminophen  (PERCOCET) 10-325 MG tablet Take 1 tablet by mouth 4 (four) times daily as needed. 12/23/22  Yes [provider]  pantoprazole  (PROTONIX ) 40 MG tablet Take 1 tablet  (40 mg total) by mouth daily before breakfast. 07/25/24  Yes Ezzard Sonny RAMAN, PA-C  pregabalin (LYRICA) 75 MG capsule Take 75 mg by mouth 2 (two) times daily. 07/19/24  Yes [provider]  PROGESTERONE PO Take by mouth.   Yes [provider]  ipratropium-albuterol  (DUONEB) 0.5-2.5 (3) MG/3ML SOLN Take 3 mLs by nebulization every 6 (six) hours as needed. 07/22/24   [provider]  levofloxacin (LEVAQUIN) 750 MG tablet Take 750 mg by mouth daily. 07/22/24   [provider]  dicyclomine  (BENTYL ) 20 MG tablet Take 1 tablet (20 mg total) by mouth 2 (two) times daily. Patient not taking: Reported on 11/26/2015 10/12/15 11/26/15  Haze Lonni PARAS, MD  diphenhydrAMINE  (BENADRYL ) 25 MG tablet Take 1 tablet (25 mg total) by mouth every 4 (four) hours as needed for itching. Patient not taking: Reported on 04/25/2015 04/11/15 05/04/15  Triplett, Tammy, PA-C  hyoscyamine  (LEVSIN  SL) 0.125 MG SL tablet Place 1 tablet (0.125 mg total) under the tongue every 4 (four) hours as needed. Patient not taking: Reported on 09/26/2015 06/04/15 10/12/15  Shirlean Therisa ORN, NP  metoCLOPramide  (REGLAN ) 10 MG tablet Take 1 tablet (10 mg total) by mouth every 8 (eight) hours as needed for nausea or vomiting. Patient not taking: Reported on 11/26/2015 10/13/15 11/26/15  Carlyle,  Alm, MD  ranitidine  (ZANTAC ) 150 MG tablet Take 1 tablet (150 mg total) by mouth 2 (two) times daily. Patient not taking: Reported on 11/26/2015 10/12/15 11/26/15  Haze Lonni PARAS, MD    Allergies as of 09/13/2024 - Review Complete 07/25/2024  Allergen Reaction Noted   Flavoring agent Anaphylaxis and Swelling 02/23/2015   Mango flavoring agent (non-screening) Anaphylaxis and Swelling 02/23/2015   Penicillins Anaphylaxis and Hives 09/12/2013   Oxycodone  Hives 10/22/2021   Sulfur  07/25/2024   Tramadol Nausea And Vomiting 08/08/2014   Zofran  [ondansetron  hcl] Other (See Comments) 10/18/2015   Keflex  [cephalexin ]  Hives, Itching, and Rash 04/13/2015   Sulfa  antibiotics Itching and Rash 02/05/2022   Toradol  [ketorolac  tromethamine ] Rash 08/08/2014    Family History  Problem Relation Age of Onset   Cancer Mother    Diabetes Mother    Lupus Mother    Thyroid  disease Mother    Colon cancer Father        AFTER AGE 20   Crohn's disease Sister     Social History   Socioeconomic History   Marital status: Divorced    Spouse name: Not on file   Number of children: Not on file   Years of education: Not on file   Highest education level: Not on file  Occupational History   Not on file  Tobacco Use   Smoking status: Never   Smokeless tobacco: Never  Vaping Use   Vaping status: Never Used  Substance and Sexual Activity   Alcohol use: No   Drug use: No   Sexual activity: Yes    Birth control/protection: Surgical  Other Topics Concern   Not on file  Social History Narrative   Not on file   Social Drivers of Health   Tobacco Use: Low Risk (09/19/2024)   Patient History    Smoking Tobacco Use: Never    Smokeless Tobacco Use: Never    Passive Exposure: Not on file  Financial Resource Strain: Not on file  Food Insecurity: Not on file  Transportation Needs: Not on file  Physical Activity: Not on file  Stress: Not on file  Social Connections: Not on file  Intimate Partner Violence: Not on file  Depression (EYV7-0): Not on file  Alcohol Screen: Not on file  Housing: Not on file  Utilities: Not on file  Health Literacy: Not on file    Review of Systems: General: Negative for fever, chills, fatigue, weakness. Eyes: Negative for vision changes.  ENT: Negative for hoarseness, difficulty swallowing , nasal congestion. CV: Negative for chest pain, angina, palpitations, dyspnea on exertion, peripheral edema.  Respiratory: Negative for dyspnea at rest, dyspnea on exertion, cough, sputum, wheezing.  GI: See history of present illness. GU:  Negative for dysuria, hematuria, urinary  incontinence, urinary frequency, nocturnal urination.  MS: Negative for joint pain, low back pain.  Derm: Negative for rash or itching.  Neuro: Negative for weakness, abnormal sensation, seizure, frequent headaches, memory loss, confusion.  Psych: Negative for anxiety, depression Endo: Negative for unusual weight change.  Heme: Negative for bruising or bleeding. Allergy: Negative for rash or hives.  Physical Exam: Vital signs in last 24 hours: Temp:  [97.8 F (36.6 C)] 97.8 F (36.6 C) (01/13 0649) Pulse Rate:  [72] 72 (01/13 0649) Resp:  [13] 13 (01/13 0649) BP: (133)/(77) 133/77 (01/13 0649) SpO2:  [99 %] 99 % (01/13 0649) Weight:  [91.6 kg] 91.6 kg (01/13 0649)   General:   Alert,  Well-developed, well-nourished, pleasant and  cooperative in NAD Head:  Normocephalic and atraumatic. Eyes:  Sclera clear, no icterus.   Conjunctiva pink. Ears:  Normal auditory acuity. Nose:  No deformity, discharge,  or lesions. Msk:  Symmetrical without gross deformities. Normal posture. Extremities:  Without clubbing or edema. Neurologic:  Alert and  oriented x4;  grossly normal neurologically. Skin:  Intact without significant lesions or rashes. Psych:  Alert and cooperative. Normal mood and affect.   Impression/Plan: Samule LILLETTE Haddock is here for an EGD with possible dilation due to history of dysphagia, GERD  Risks, benefits, limitations, imponderables and alternatives regarding procedure have been reviewed with the patient. Questions have been answered. All parties agreeable.

## 2024-09-19 NOTE — Anesthesia Procedure Notes (Signed)
 Date/Time: 09/19/2024 8:03 AM  Performed by: Barbarann Verneita RAMAN, CRNAPre-anesthesia Checklist: Patient identified, Emergency Drugs available, Suction available, Timeout performed and Patient being monitored Patient Re-evaluated:Patient Re-evaluated prior to induction Oxygen Delivery Method: Nasal Cannula Comments: Optiflow

## 2024-09-19 NOTE — Anesthesia Preprocedure Evaluation (Signed)
"                                    Anesthesia Evaluation  Patient identified by MRN, date of birth, ID band Patient awake    Reviewed: Allergy & Precautions, H&P , NPO status , Patient's Chart, lab work & pertinent test results, reviewed documented beta blocker date and time   Airway Mallampati: II  TM Distance: >3 FB Neck ROM: full    Dental no notable dental hx.    Pulmonary pneumonia   Pulmonary exam normal breath sounds clear to auscultation       Cardiovascular Exercise Tolerance: Good hypertension,  Rhythm:regular Rate:Normal     Neuro/Psych  PSYCHIATRIC DISORDERS  Depression     Neuromuscular disease    GI/Hepatic Neg liver ROS,GERD  ,,  Endo/Other  Hypothyroidism  Class 3 obesity  Renal/GU negative Renal ROS  negative genitourinary   Musculoskeletal   Abdominal   Peds  Hematology negative hematology ROS (+)   Anesthesia Other Findings   Reproductive/Obstetrics negative OB ROS                              Anesthesia Physical Anesthesia Plan  ASA: 3  Anesthesia Plan: MAC   Post-op Pain Management:    Induction:   PONV Risk Score and Plan: Propofol  infusion  Airway Management Planned:   Additional Equipment:   Intra-op Plan:   Post-operative Plan:   Informed Consent: I have reviewed the patients History and Physical, chart, labs and discussed the procedure including the risks, benefits and alternatives for the proposed anesthesia with the patient or authorized representative who has indicated his/her understanding and acceptance.     Dental Advisory Given  Plan Discussed with: CRNA  Anesthesia Plan Comments:         Anesthesia Quick Evaluation  "

## 2024-09-19 NOTE — Op Note (Signed)
 [facility] Patient Name: Regina Patton Procedure Date: 09/19/2024 7:55 AM MRN: [id number] Date of Birth: [birth year] Attending MD: Carlin POUR. Cindie , [place], [id number] CSN: [id number] Age: 54 Admit Type: Outpatient Procedure:                Upper GI endoscopy Indications:              Dysphagia, Heartburn Providers:                Carlin POUR. Cindie, DO, Devere Lodge, Rosina Jackolyn Daphne Rosalynn Technician, Technician Referring MD:              Medicines:                See the Anesthesia note for documentation of the                            administered medications Complications:            No immediate complications. Estimated Blood Loss:     Estimated blood loss was minimal. Procedure:                Pre-Anesthesia Assessment:                           - The anesthesia plan was to use monitored                            anesthesia care (MAC).                           After obtaining informed consent, the endoscope was                            passed under direct vision. Throughout the                            procedure, the patient's blood pressure, pulse, and                            oxygen saturations were monitored continuously. The                            Endoscope was introduced through the mouth, and                            advanced to the second part of duodenum. The upper                            GI endoscopy was accomplished without difficulty.                            The patient tolerated the procedure well. Scope In: 8:09:05 AM Scope Out: 8:15:08 AM Total Procedure Duration: 0 hours 6 minutes 3 seconds  Findings:      The Z-line was variable and was found  36 cm from the incisors.      No endoscopic abnormality was evident in the esophagus to explain the       patient's complaint of dysphagia. Preparations were made for empiric       dilation. A TTS dilator was passed through the scope. Dilation with an       18-19-20 mm  balloon dilator was performed to 20 mm. Dilation was       performed with a mild resistance at 20 mm. Estimated blood loss was none.      Localized mild inflammation was found in the gastric antrum. Biopsies       were taken with a cold forceps for Helicobacter pylori testing.      The duodenal bulb, first portion of the duodenum and second portion of       the duodenum were normal. Impression:               - Z-line variable, 36 cm from the incisors.                           - Gastritis. Biopsied.                           - Normal duodenal bulb, first portion of the                            duodenum and second portion of the duodenum. Moderate Sedation:      Per Anesthesia Care Recommendation:           - Patient has a contact number available for                            emergencies. The signs and symptoms of potential                            delayed complications were discussed with the                            patient. Return to normal activities tomorrow.                            Written discharge instructions were provided to the                            patient.                           - Resume previous diet.                           - Continue present medications.                           - Await pathology results.                           - Repeat upper endoscopy PRN for retreatment.                           -  Return to GI clinic in 3 months.                           - Use Protonix  (pantoprazole ) 40 mg PO daily. Procedure Code(s):        --- Professional ---                           [phone], Esophagogastroduodenoscopy, flexible,                            transoral; with biopsy, single or multiple Diagnosis Code(s):        --- Professional ---                           K22.89, Other specified disease of esophagus                           K29.70, Gastritis, unspecified, without bleeding                           R13.10, Dysphagia, unspecified                            R12, Heartburn CPT copyright 2022 [employer]. All rights reserved. The codes documented in this report are preliminary and upon coder review may  be revised to meet current compliance requirements. Carlin POUR. Cindie, DO Carlin POUR. Cindie, DO 09/19/2024 8:17:34 AM This report has been signed electronically. Number of Addenda: 0

## 2024-09-20 ENCOUNTER — Encounter (HOSPITAL_COMMUNITY): Payer: Self-pay | Admitting: Internal Medicine

## 2024-09-20 ENCOUNTER — Ambulatory Visit: Payer: Self-pay | Admitting: Gastroenterology

## 2024-09-20 LAB — SURGICAL PATHOLOGY

## 2024-09-21 ENCOUNTER — Ambulatory Visit: Payer: Self-pay | Admitting: Internal Medicine

## 2024-09-21 NOTE — Anesthesia Postprocedure Evaluation (Signed)
"   Anesthesia Post Note  Patient: Regina Patton  Procedure(s) Performed: EGD (ESOPHAGOGASTRODUODENOSCOPY) DILATION, ESOPHAGUS  Patient location during evaluation: Phase II Anesthesia Type: MAC Level of consciousness: awake Pain management: pain level controlled Vital Signs Assessment: post-procedure vital signs reviewed and stable Respiratory status: spontaneous breathing and respiratory function stable Cardiovascular status: blood pressure returned to baseline and stable Postop Assessment: no headache and no apparent nausea or vomiting Anesthetic complications: no Comments: Late entry   No notable events documented.   Last Vitals:  Vitals:   09/19/24 0820 09/19/24 0825  BP: (!) 85/61 114/84  Pulse: 74 70  Resp: 15 15  Temp: 36.9 C   SpO2: 97% 97%    Last Pain:  Vitals:   09/20/24 1441  TempSrc:   PainSc: 0-No pain                 Regina Patton      "

## 2024-09-25 NOTE — Telephone Encounter (Signed)
 6 yr EGD noted in recall procedure note and pathology result faxed to PCP

## 2024-10-09 ENCOUNTER — Emergency Department (HOSPITAL_COMMUNITY)
Admission: EM | Admit: 2024-10-09 | Discharge: 2024-10-09 | Disposition: A | Discharge status: Home / Self Care | Attending: Emergency Medicine | Admitting: Emergency Medicine

## 2024-10-09 ENCOUNTER — Other Ambulatory Visit: Payer: Self-pay

## 2024-10-09 ENCOUNTER — Encounter (HOSPITAL_COMMUNITY): Payer: Self-pay

## 2024-10-09 ENCOUNTER — Emergency Department (HOSPITAL_COMMUNITY)

## 2024-10-09 DIAGNOSIS — M545 Low back pain, unspecified: Secondary | ICD-10-CM | POA: Insufficient documentation

## 2024-10-09 LAB — CBC WITH DIFFERENTIAL/PLATELET
Abs Immature Granulocytes: 0.03 10*3/uL (ref 0.00–0.07)
Basophils Absolute: 0 10*3/uL (ref 0.0–0.1)
Basophils Relative: 0 %
Eosinophils Absolute: 0.2 10*3/uL (ref 0.0–0.5)
Eosinophils Relative: 2 %
HCT: 42.6 % (ref 36.0–46.0)
Hemoglobin: 14.5 g/dL (ref 12.0–15.0)
Immature Granulocytes: 0 %
Lymphocytes Relative: 41 %
Lymphs Abs: 3 10*3/uL (ref 0.7–4.0)
MCH: 31.9 pg (ref 26.0–34.0)
MCHC: 34 g/dL (ref 30.0–36.0)
MCV: 93.6 fL (ref 80.0–100.0)
Monocytes Absolute: 0.4 10*3/uL (ref 0.1–1.0)
Monocytes Relative: 5 %
Neutro Abs: 3.8 10*3/uL (ref 1.7–7.7)
Neutrophils Relative %: 52 %
Platelets: 235 10*3/uL (ref 150–400)
RBC: 4.55 MIL/uL (ref 3.87–5.11)
RDW: 12.9 % (ref 11.5–15.5)
WBC: 7.4 10*3/uL (ref 4.0–10.5)
nRBC: 0 % (ref 0.0–0.2)

## 2024-10-09 LAB — URINALYSIS, ROUTINE W REFLEX MICROSCOPIC
Bilirubin Urine: NEGATIVE
Glucose, UA: NEGATIVE mg/dL
Ketones, ur: NEGATIVE mg/dL
Leukocytes,Ua: NEGATIVE
Nitrite: NEGATIVE
Protein, ur: NEGATIVE mg/dL
Specific Gravity, Urine: 1.025 (ref 1.005–1.030)
pH: 5 (ref 5.0–8.0)

## 2024-10-09 LAB — BASIC METABOLIC PANEL WITH GFR
Anion gap: 14 (ref 5–15)
BUN: 10 mg/dL (ref 6–20)
CO2: 22 mmol/L (ref 22–32)
Calcium: 9.1 mg/dL (ref 8.9–10.3)
Chloride: 105 mmol/L (ref 98–111)
Creatinine, Ser: 0.89 mg/dL (ref 0.44–1.00)
GFR, Estimated: >60 mL/min
Glucose, Bld: 103 mg/dL — ABNORMAL HIGH (ref 70–99)
Potassium: 3.7 mmol/L (ref 3.5–5.1)
Sodium: 141 mmol/L (ref 135–145)

## 2024-10-09 LAB — PREGNANCY, URINE: Preg Test, Ur: NEGATIVE

## 2024-10-09 MED ORDER — DEXAMETHASONE SOD PHOSPHATE PF 10 MG/ML IJ SOLN
10.0000 mg | Freq: Once | INTRAMUSCULAR | Status: AC
  Administered 2024-10-09: 10 mg via INTRAVENOUS
  Filled 2024-10-09: qty 1

## 2024-10-09 MED ORDER — HYDROMORPHONE HCL 1 MG/ML IJ SOLN
1.0000 mg | Freq: Once | INTRAMUSCULAR | Status: AC
  Administered 2024-10-09: 1 mg via INTRAVENOUS
  Filled 2024-10-09: qty 1

## 2024-10-09 MED ORDER — MORPHINE SULFATE (PF) 4 MG/ML IV SOLN
4.0000 mg | Freq: Once | INTRAVENOUS | Status: AC
  Administered 2024-10-09: 4 mg via INTRAVENOUS
  Filled 2024-10-09: qty 1

## 2024-10-09 NOTE — Discharge Instructions (Signed)
 Today's evaluation has been generally reassuring.  It is very important to keep your appointment with your neurosurgeon tomorrow.

## 2024-10-09 NOTE — ED Triage Notes (Addendum)
 Pt arrived via POV c/o of back pain. Pt does have rods in back and the pain has been increasing and pain medication has not been working. Pt has had bouts of incontinence (stated she has had this happen before surgery). Pt ambulated from waiting room to triage w/ no imbalances noted.

## 2024-10-10 ENCOUNTER — Other Ambulatory Visit (HOSPITAL_COMMUNITY): Payer: Self-pay | Admitting: Neurological Surgery

## 2024-10-10 DIAGNOSIS — M5416 Radiculopathy, lumbar region: Secondary | ICD-10-CM

## 2024-10-11 ENCOUNTER — Ambulatory Visit (HOSPITAL_COMMUNITY)
Admission: RE | Admit: 2024-10-11 | Discharge: 2024-10-11 | Disposition: A | Source: Ambulatory Visit | Attending: Neurological Surgery | Admitting: Neurological Surgery

## 2024-10-11 DIAGNOSIS — M5416 Radiculopathy, lumbar region: Secondary | ICD-10-CM

## 2024-11-01 ENCOUNTER — Ambulatory Visit: Admitting: Gastroenterology
